# Patient Record
Sex: Female | Born: 1940 | Race: White | Hispanic: No | Smoking: Former smoker
Health system: Southern US, Community
[De-identification: ages and names within clinical notes are randomized; demographics above are authoritative.]

## PROBLEM LIST (undated history)

## (undated) DIAGNOSIS — M545 Low back pain, unspecified: Secondary | ICD-10-CM

## (undated) DIAGNOSIS — G4700 Insomnia, unspecified: Secondary | ICD-10-CM

## (undated) DIAGNOSIS — F0280 Dementia in other diseases classified elsewhere without behavioral disturbance: Secondary | ICD-10-CM

## (undated) DIAGNOSIS — R1110 Vomiting, unspecified: Secondary | ICD-10-CM

## (undated) HISTORY — PX: VAGINAL HYSTERECTOMY: SUR661

---
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
## 1999-10-28 ENCOUNTER — Ambulatory Visit (HOSPITAL_COMMUNITY): Admission: RE | Admit: 1999-10-28 | Discharge: 1999-10-28 | Payer: Self-pay

## 2001-08-08 ENCOUNTER — Inpatient Hospital Stay (HOSPITAL_COMMUNITY): Admission: EM | Admit: 2001-08-08 | Discharge: 2001-08-13 | Payer: Self-pay | Admitting: *Deleted

## 2001-08-08 ENCOUNTER — Encounter: Payer: Self-pay | Admitting: *Deleted

## 2001-08-09 ENCOUNTER — Encounter: Payer: Self-pay | Admitting: Family Medicine

## 2001-08-22 ENCOUNTER — Encounter: Admission: RE | Admit: 2001-08-22 | Discharge: 2001-08-22 | Payer: Self-pay | Admitting: Family Medicine

## 2001-11-01 ENCOUNTER — Ambulatory Visit (HOSPITAL_COMMUNITY): Admission: RE | Admit: 2001-11-01 | Discharge: 2001-11-01 | Payer: Self-pay | Admitting: *Deleted

## 2004-11-24 ENCOUNTER — Emergency Department (HOSPITAL_COMMUNITY): Admission: EM | Admit: 2004-11-24 | Discharge: 2004-11-24 | Payer: Self-pay | Admitting: Emergency Medicine

## 2006-05-31 ENCOUNTER — Emergency Department (HOSPITAL_COMMUNITY): Admission: EM | Admit: 2006-05-31 | Discharge: 2006-05-31 | Payer: Self-pay | Admitting: Emergency Medicine

## 2006-06-10 ENCOUNTER — Emergency Department (HOSPITAL_COMMUNITY): Admission: EM | Admit: 2006-06-10 | Discharge: 2006-06-10 | Payer: Self-pay | Admitting: Emergency Medicine

## 2006-11-16 ENCOUNTER — Encounter: Admission: RE | Admit: 2006-11-16 | Discharge: 2006-11-16 | Payer: Self-pay | Admitting: Neurology

## 2007-01-17 ENCOUNTER — Emergency Department (HOSPITAL_COMMUNITY): Admission: EM | Admit: 2007-01-17 | Discharge: 2007-01-17 | Payer: Self-pay | Admitting: *Deleted

## 2007-01-24 ENCOUNTER — Inpatient Hospital Stay (HOSPITAL_COMMUNITY): Admission: EM | Admit: 2007-01-24 | Discharge: 2007-01-26 | Payer: Self-pay | Admitting: Emergency Medicine

## 2007-10-01 IMAGING — CR DG CHEST 2V
2 series · 2 of 2 positions shown · non-contrast
Comparison: 06/10/06.

CLINICAL DATA: Wheezing.
 TWO VIEW CHEST:

[view not recorded (1 of 2)]
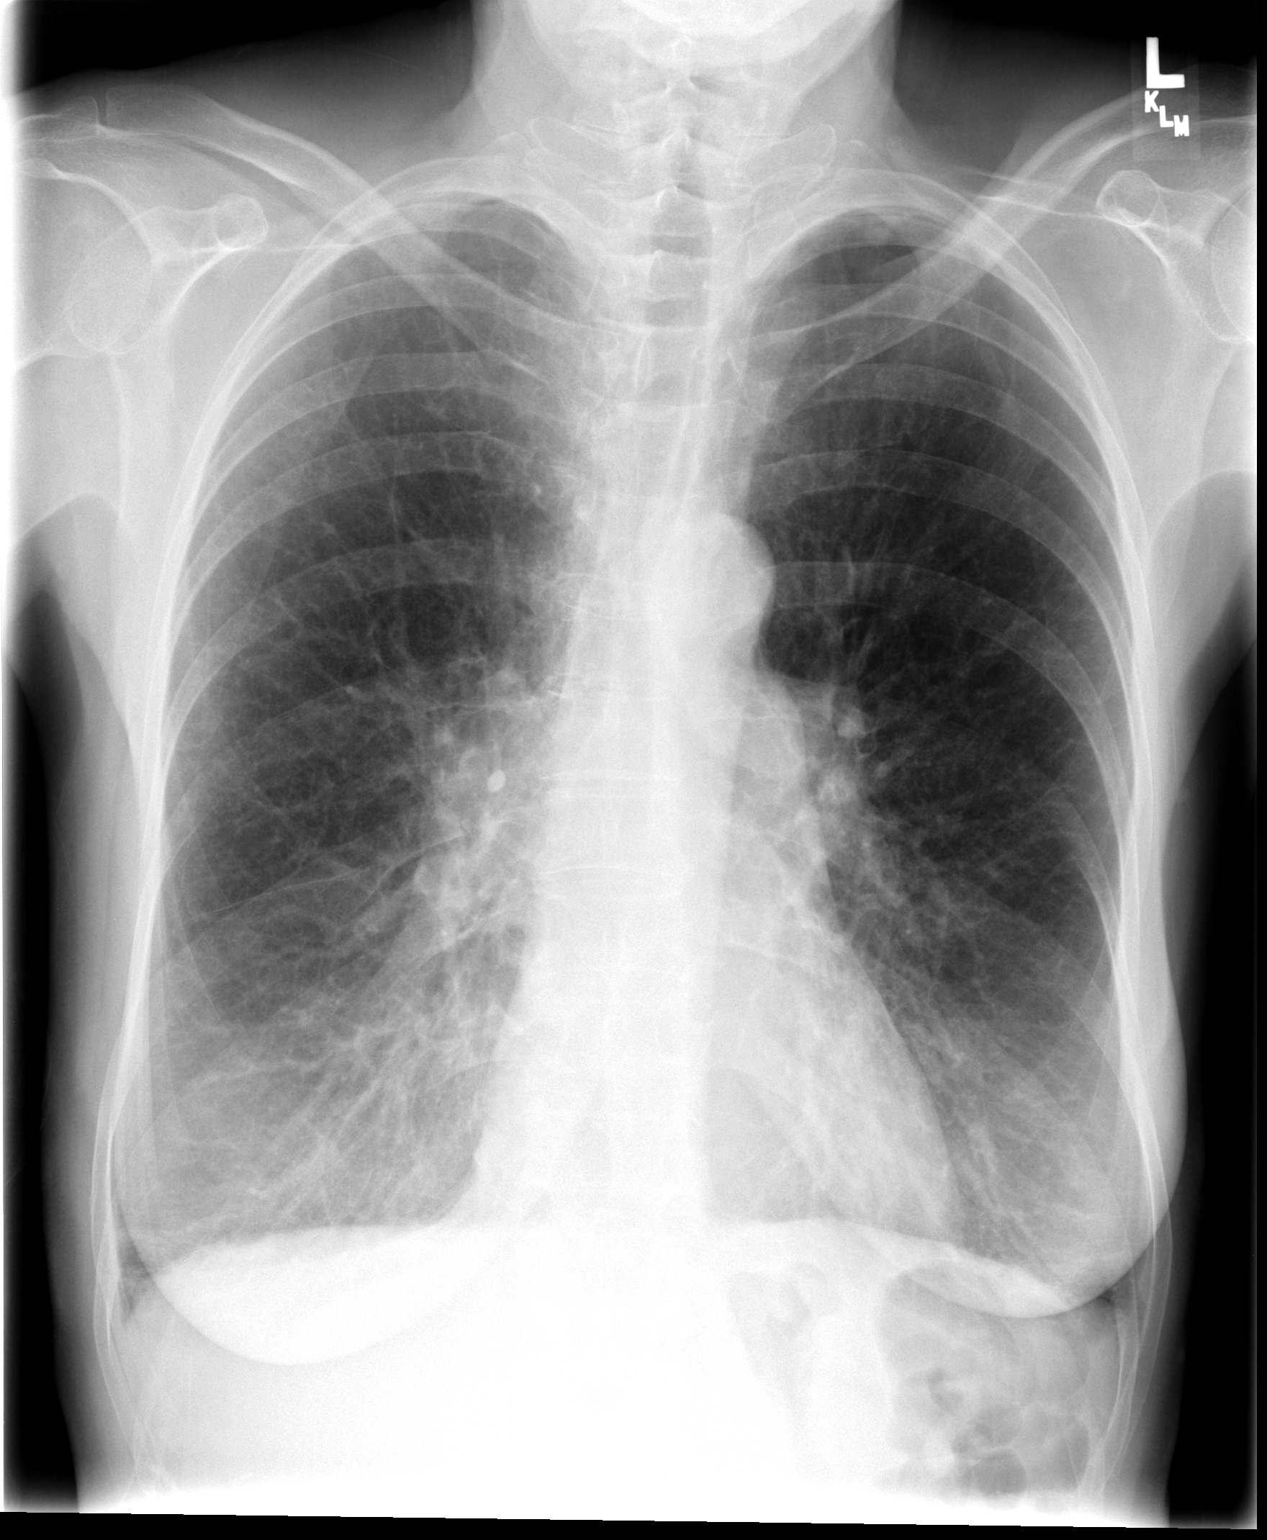

[view not recorded (2 of 2)]
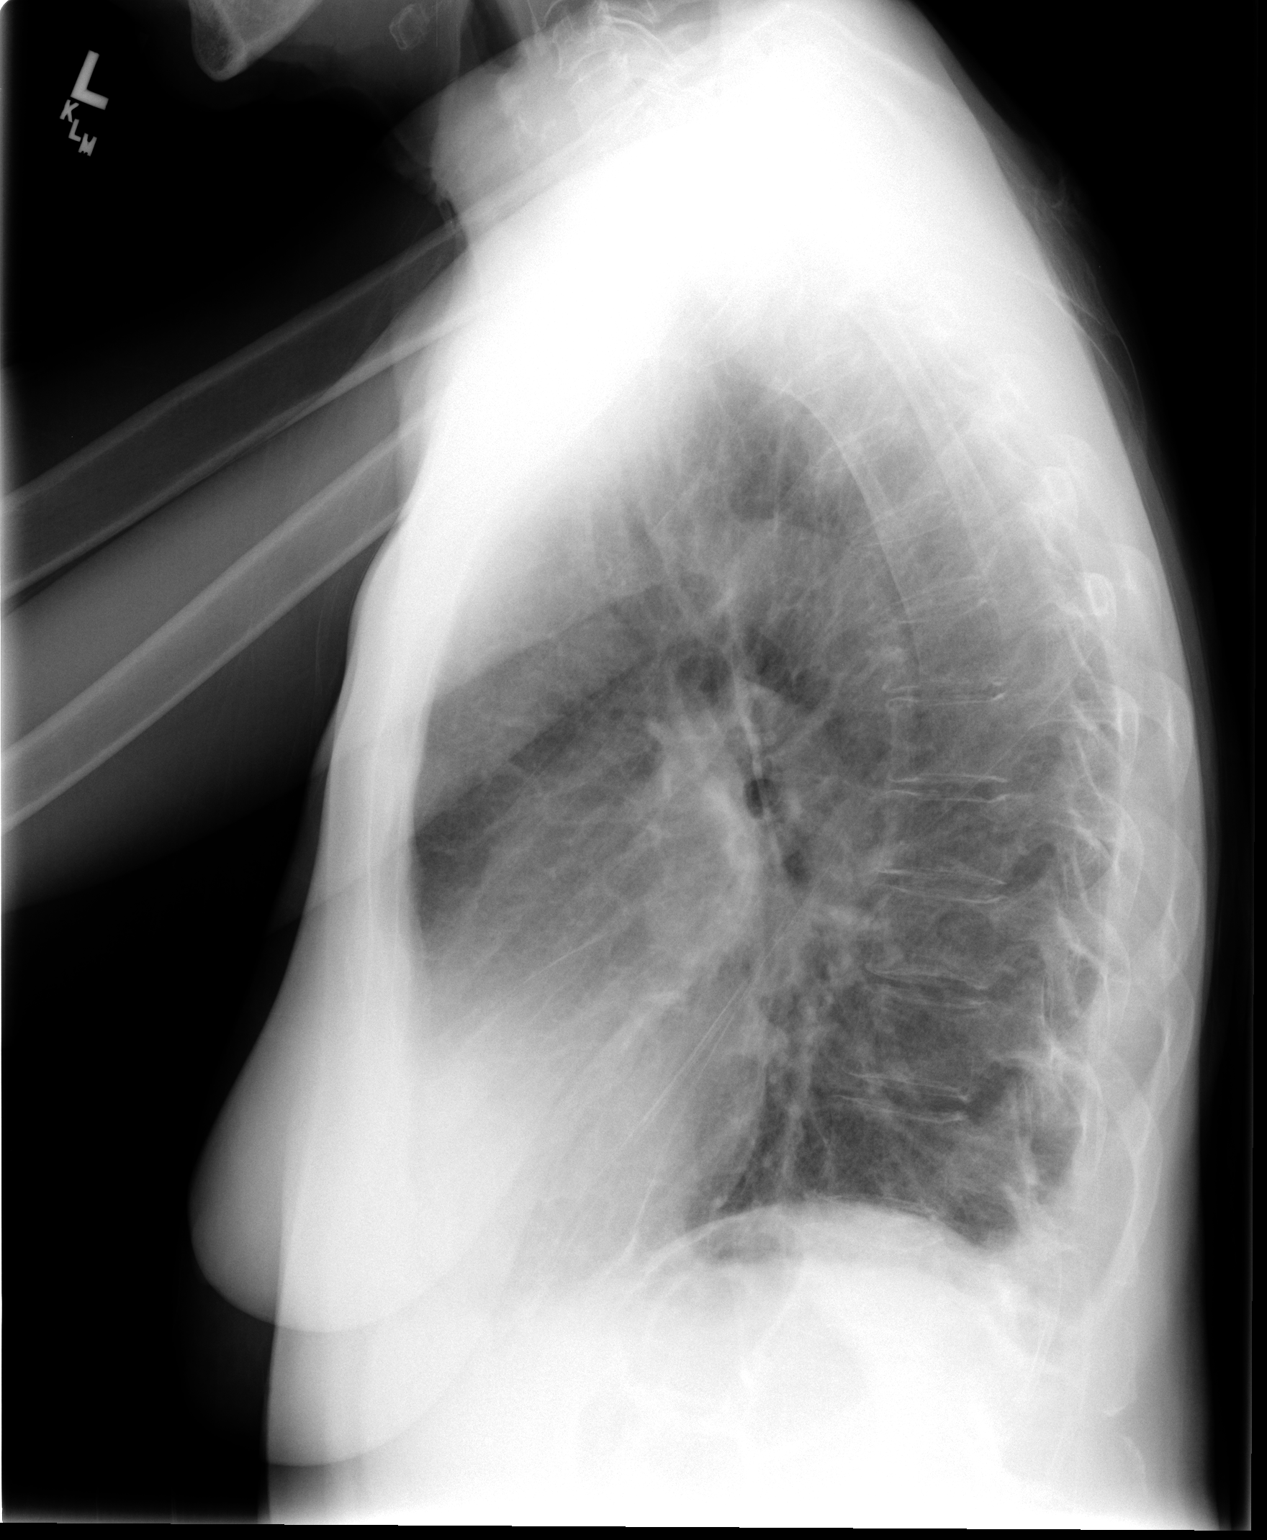

[2 of 2 positions shown; findings below may reference images not displayed]

FINDINGS: Normal heart size.  Lungs are hyperinflated with background COPD and chronic bronchial thickening.  Bibasilar atelectasis or scarring is noted. The hila are prominent bilaterally.  This can be seen with pulmonary arterial hypertension from COPD.  Underlying adenopathy is not excluded.
IMPRESSION: 1.  Chronic background COPD and bronchial thickening.
 2.  Bibasilar atelectasis.
 3.  No acute CHF or pneumonia.
 4.  Prominent hila bilaterally.  Suspect enlarged pulmonary arteries from secondary pulmonary arterial hypertension.  Underlying adenopathy not entirely excluded.

## 2009-04-05 ENCOUNTER — Emergency Department (HOSPITAL_COMMUNITY): Admission: EM | Admit: 2009-04-05 | Discharge: 2009-04-05 | Payer: Self-pay | Admitting: Emergency Medicine

## 2009-06-24 ENCOUNTER — Emergency Department (HOSPITAL_COMMUNITY): Admission: EM | Admit: 2009-06-24 | Discharge: 2009-06-25 | Payer: Self-pay | Admitting: Emergency Medicine

## 2010-02-03 ENCOUNTER — Emergency Department (HOSPITAL_COMMUNITY): Admission: EM | Admit: 2010-02-03 | Discharge: 2010-02-03 | Payer: Self-pay | Admitting: Emergency Medicine

## 2010-11-09 ENCOUNTER — Emergency Department (HOSPITAL_COMMUNITY)
Admission: EM | Admit: 2010-11-09 | Discharge: 2010-11-09 | Disposition: A | Discharge status: Skilled Nursing Facility | Payer: Medicare Other | Attending: Emergency Medicine | Admitting: Emergency Medicine

## 2010-11-09 ENCOUNTER — Emergency Department (HOSPITAL_COMMUNITY): Payer: Medicare Other

## 2010-11-09 DIAGNOSIS — R112 Nausea with vomiting, unspecified: Secondary | ICD-10-CM | POA: Insufficient documentation

## 2010-11-09 DIAGNOSIS — M810 Age-related osteoporosis without current pathological fracture: Secondary | ICD-10-CM | POA: Insufficient documentation

## 2010-11-09 DIAGNOSIS — K5900 Constipation, unspecified: Secondary | ICD-10-CM | POA: Insufficient documentation

## 2010-11-09 DIAGNOSIS — F1020 Alcohol dependence, uncomplicated: Secondary | ICD-10-CM | POA: Insufficient documentation

## 2010-11-09 DIAGNOSIS — F319 Bipolar disorder, unspecified: Secondary | ICD-10-CM | POA: Insufficient documentation

## 2010-11-09 LAB — CBC
HCT: 38.5 % (ref 36.0–46.0)
MCHC: 31.9 g/dL (ref 30.0–36.0)
MCV: 95.3 fL (ref 78.0–100.0)
Platelets: 343 10*3/uL (ref 150–400)
RDW: 13.2 % (ref 11.5–15.5)
WBC: 8 10*3/uL (ref 4.0–10.5)

## 2010-11-09 LAB — URINALYSIS, ROUTINE W REFLEX MICROSCOPIC
Hgb urine dipstick: NEGATIVE
Protein, ur: NEGATIVE mg/dL
Specific Gravity, Urine: 1.03 (ref 1.005–1.030)
Urine Glucose, Fasting: NEGATIVE mg/dL
Urobilinogen, UA: 1 mg/dL (ref 0.0–1.0)

## 2010-11-09 LAB — COMPREHENSIVE METABOLIC PANEL
AST: 19 U/L (ref 0–37)
Albumin: 3.5 g/dL (ref 3.5–5.2)
CO2: 22 mEq/L (ref 19–32)
Glucose, Bld: 100 mg/dL — ABNORMAL HIGH (ref 70–99)
Potassium: 3.7 mEq/L (ref 3.5–5.1)

## 2010-11-09 LAB — DIFFERENTIAL
Basophils Absolute: 0 10*3/uL (ref 0.0–0.1)
Basophils Relative: 0 % (ref 0–1)
Eosinophils Absolute: 0.3 10*3/uL (ref 0.0–0.7)
Lymphs Abs: 3.7 10*3/uL (ref 0.7–4.0)
Neutro Abs: 3.2 10*3/uL (ref 1.7–7.7)

## 2010-11-09 LAB — LIPASE, BLOOD: Lipase: 20 U/L (ref 11–59)

## 2010-11-26 ENCOUNTER — Inpatient Hospital Stay (HOSPITAL_COMMUNITY)
Admission: EM | Admit: 2010-11-26 | Discharge: 2010-12-01 | DRG: 193 | Disposition: A | Discharge status: Skilled Nursing Facility | Payer: Medicare Other | Attending: Internal Medicine | Admitting: Internal Medicine

## 2010-11-26 ENCOUNTER — Emergency Department (HOSPITAL_COMMUNITY): Payer: Medicare Other

## 2010-11-26 DIAGNOSIS — G20A1 Parkinson's disease without dyskinesia, without mention of fluctuations: Secondary | ICD-10-CM | POA: Diagnosis present

## 2010-11-26 DIAGNOSIS — J189 Pneumonia, unspecified organism: Principal | ICD-10-CM | POA: Diagnosis present

## 2010-11-26 DIAGNOSIS — F319 Bipolar disorder, unspecified: Secondary | ICD-10-CM | POA: Diagnosis present

## 2010-11-26 DIAGNOSIS — J441 Chronic obstructive pulmonary disease with (acute) exacerbation: Secondary | ICD-10-CM | POA: Diagnosis present

## 2010-11-26 DIAGNOSIS — K5900 Constipation, unspecified: Secondary | ICD-10-CM | POA: Diagnosis present

## 2010-11-26 DIAGNOSIS — E876 Hypokalemia: Secondary | ICD-10-CM | POA: Diagnosis present

## 2010-11-26 DIAGNOSIS — G20 Parkinson's disease: Secondary | ICD-10-CM | POA: Diagnosis present

## 2010-11-26 DIAGNOSIS — E860 Dehydration: Secondary | ICD-10-CM | POA: Diagnosis present

## 2010-11-26 DIAGNOSIS — F17200 Nicotine dependence, unspecified, uncomplicated: Secondary | ICD-10-CM | POA: Diagnosis present

## 2010-11-26 DIAGNOSIS — F341 Dysthymic disorder: Secondary | ICD-10-CM | POA: Diagnosis present

## 2010-11-26 DIAGNOSIS — E785 Hyperlipidemia, unspecified: Secondary | ICD-10-CM | POA: Diagnosis present

## 2010-11-26 DIAGNOSIS — J9600 Acute respiratory failure, unspecified whether with hypoxia or hypercapnia: Secondary | ICD-10-CM | POA: Diagnosis present

## 2010-11-26 LAB — CBC
HCT: 36.3 % (ref 36.0–46.0)
Hemoglobin: 12 g/dL (ref 12.0–15.0)
MCH: 31 pg (ref 26.0–34.0)
MCV: 93.8 fL (ref 78.0–100.0)
RBC: 3.87 MIL/uL (ref 3.87–5.11)
WBC: 17.1 10*3/uL — ABNORMAL HIGH (ref 4.0–10.5)

## 2010-11-26 LAB — POCT CARDIAC MARKERS: CKMB, poc: 1.2 ng/mL (ref 1.0–8.0)

## 2010-11-26 LAB — DIFFERENTIAL
Basophils Absolute: 0 10*3/uL (ref 0.0–0.1)
Eosinophils Absolute: 0 10*3/uL (ref 0.0–0.7)
Eosinophils Relative: 0 % (ref 0–5)
Lymphocytes Relative: 14 % (ref 12–46)
Monocytes Relative: 13 % — ABNORMAL HIGH (ref 3–12)
Neutrophils Relative %: 73 % (ref 43–77)

## 2010-11-26 LAB — BASIC METABOLIC PANEL
BUN: 22 mg/dL (ref 6–23)
CO2: 21 mEq/L (ref 19–32)
Calcium: 8.6 mg/dL (ref 8.4–10.5)
Creatinine, Ser: 1.04 mg/dL (ref 0.4–1.2)
GFR calc non Af Amer: 52 mL/min — ABNORMAL LOW (ref >60)
Potassium: 3.8 mEq/L (ref 3.5–5.1)
Sodium: 135 mEq/L (ref 135–145)

## 2010-11-26 LAB — URINALYSIS, ROUTINE W REFLEX MICROSCOPIC
Glucose, UA: NEGATIVE mg/dL
Hgb urine dipstick: NEGATIVE
Protein, ur: NEGATIVE mg/dL
Specific Gravity, Urine: 1.025 (ref 1.005–1.030)
pH: 5.5 (ref 5.0–8.0)

## 2010-11-26 LAB — BRAIN NATRIURETIC PEPTIDE: Pro B Natriuretic peptide (BNP): 89.1 pg/mL (ref 0.0–100.0)

## 2010-11-27 ENCOUNTER — Inpatient Hospital Stay (HOSPITAL_COMMUNITY): Payer: Medicare Other

## 2010-11-27 LAB — CBC
Hemoglobin: 11.6 g/dL — ABNORMAL LOW (ref 12.0–15.0)
MCHC: 32.1 g/dL (ref 30.0–36.0)
RBC: 3.83 MIL/uL — ABNORMAL LOW (ref 3.87–5.11)
WBC: 15.8 10*3/uL — ABNORMAL HIGH (ref 4.0–10.5)

## 2010-11-27 LAB — COMPREHENSIVE METABOLIC PANEL
ALT: 20 U/L (ref 0–35)
AST: 25 U/L (ref 0–37)
CO2: 19 mEq/L (ref 19–32)
Calcium: 8.8 mg/dL (ref 8.4–10.5)
Chloride: 112 mEq/L (ref 96–112)
GFR calc Af Amer: >60 mL/min (ref >60)
GFR calc non Af Amer: 57 mL/min — ABNORMAL LOW (ref >60)
Sodium: 139 mEq/L (ref 135–145)

## 2010-11-27 LAB — T4, FREE: Free T4: 1.15 ng/dL (ref 0.80–1.80)

## 2010-11-27 LAB — INFLUENZA PANEL BY PCR (TYPE A & B): Influenza B By PCR: NEGATIVE

## 2010-11-27 LAB — TSH: TSH: 1.354 u[IU]/mL (ref 0.350–4.500)

## 2010-11-27 NOTE — H&P (Signed)
[name], [name]  MEDICAL RECORD NO.:  [name]           PATIENT TYPE:  E  LOCATION:  WLED                         FACILITY:  [facility]  PHYSICIAN:  Vania Rea, M.D. DATE OF BIRTH:  [birth year]  DATE OF ADMISSION:  11/26/2010 DATE OF DISCHARGE:                             HISTORY & PHYSICAL   PRIMARY CARE PHYSICIAN:  Dr. Florentina Jenny  CHIEF COMPLAINT:  Persistent cough and shortness of breath for 1 week.  HISTORY OF PRESENT ILLNESS:  This is a resident of facility known as [facility] who was brought in by EMS with a history of cough and shortness of breath for 1 week.  The history is limited by the fact that we are unable to contact anybody at this facility, and the patient has a guardian of Department of Social Services who are now closed.  The history is, therefore, taken by reviewing the records that have accompanied the patient from the facility.  The papers that accompany her do not indicate that she uses oxygen.  This does not appear to be a skilled nursing facility.  Her guardian is listed in the records as Jenna Luo at [employer] [facility].  Telephone number [phone].  The patient's baseline status is unclear; however, she does have a history of bipolar disorder, dementia and Parkinson's.  PAST MEDICAL HISTORY: 1. Bipolar disorder. 2. Dementia. 3. Parkinson's type tremor. 4. Osteoarthritis. 5. Chronic constipation. 6. History of alcoholism.  MEDICATIONS: 1. Trazodone 100 mg at bedtime. 2. Seroquel 300 mg at bedtime. 3. Wellbutrin 150 mg twice daily. 4. Effexor extended release 150 mg each morning. 5. Ativan 0.5 mg twice daily when necessary. 6. Simvastatin 20 mg at bedtime. 7. Senna 1 tablet at bedtime. 8. MiraLAX 17 g each evening. 9. Niaspan 500 mg each evening. 10.Acetaminophen 500 mg three times daily. 11.Colace 100 mg twice daily. 12.Vitamin D3 1000 units daily. 13.Multivitamins  daily. 14.Megace suspension 400 mg daily.  ALLERGIES:  No known drug or food allergies.  SOCIAL HISTORY:  The patient apparently does smoke.  The chart indicates a history of alcoholism.  She is a resident of an extended care facility.  FAMILY HISTORY:  Unable to obtain.  REVIEW OF SYSTEMS:  Other than noted above, unable to obtain.  PHYSICAL EXAMINATION:  Elderly, Caucasian lady sitting up on the stretcher, tachypneic and somewhat confused, but not agitated. VITAL SIGNS:  Her temperature is 100.4, pulse is 65, respiration 22, blood pressure 116/65.  She is saturating at 94% on 3 liters. HEENT:  Her pupils are round and equal.  Mucous membranes pink, dry, anicteric.  No cervical lymphadenopathy or thyromegaly.  No carotid bruit. CHEST:  She has diffuse rhonchi generally.  No crackles heard. CARDIOVASCULAR SYSTEM:  Regular rhythm.  No murmurs heard. ABDOMEN:  Soft, nontender.  No masses. EXTREMITIES:  She has arthritic deformities of the hands and knees. Skin of her legs is dry and coarse.  Dorsalis pedis pulses are 1+ bilaterally. CENTRAL NERVOUS SYSTEM:  She has a coarse, generalized tremor, but no focal neurologic deficits.  LABORATORY DATA:  Her white count is 17.1,  hemoglobin 12.0, platelets 281.  Absolute neutrophil count 12.5.  Morphology shows toxic granulations.  Her beta natriuretic peptide is normal at 89.  Her sodium is 135, potassium 3.8, chloride 107, CO2 21, glucose 124, BUN 22, creatinine 1.0, calcium 8.6.  Her cardiac markers are completely normal with  undetectable troponins.  Myoglobin is 166.  CK-MB 1.2.  A two-view chest x-ray shows COPD with bibasilar scarring, no acute infiltrate and a small pleural effusion.  ASSESSMENT: 1. Acute exacerbation of chronic obstructive pulmonary disease. 2. Acute respiratory failure secondary to the above. 3. Dehydration. 4. Possible pneumonia given dehydration. 5. Dementia. 6. Bipolar disorder. 7. Major  depression. 8. History of constipation.  PLAN: 1. We will admit this lady to telemetry for treatment with antibiotics     and IV fluids as well as nebulizations.  We will hydrate her and     would recommend repeating a chest x-ray in the morning when     pneumonia may make itself more evident.  Alternatively     consideration could be given to a CT scan of the chest to further     elucidate her pulmonary problem. 2. We will discuss with Social Work to make further attempts to     contact her extended care facility early tomorrow or make further     attempts to contact her primary care physician or her guardian     tomorrow during office hours, since both of these attempts have     currently failed.     Vania Rea, M.D.     LC/MEDQ  D:  11/26/2010  T:  11/26/2010  Job:  [id number]  cc:   Florentina Jenny, M.D.  Virgil Benedict, M.D.  Kennon Rounds, MD  Electronically Signed by Vania Rea M.D. on 11/27/2010 02:25:49 AM

## 2010-11-28 LAB — CBC
HCT: 32.7 % — ABNORMAL LOW (ref 36.0–46.0)
Hemoglobin: 10.5 g/dL — ABNORMAL LOW (ref 12.0–15.0)
MCHC: 32.1 g/dL (ref 30.0–36.0)

## 2010-11-28 LAB — BASIC METABOLIC PANEL
CO2: 20 mEq/L (ref 19–32)
Calcium: 8.8 mg/dL (ref 8.4–10.5)
Chloride: 116 mEq/L — ABNORMAL HIGH (ref 96–112)
Glucose, Bld: 155 mg/dL — ABNORMAL HIGH (ref 70–99)
Potassium: 3.9 mEq/L (ref 3.5–5.1)
Sodium: 143 mEq/L (ref 135–145)

## 2010-11-29 LAB — BASIC METABOLIC PANEL
BUN: 22 mg/dL (ref 6–23)
CO2: 23 mEq/L (ref 19–32)
Calcium: 9 mg/dL (ref 8.4–10.5)
GFR calc non Af Amer: >60 mL/min (ref >60)
Glucose, Bld: 131 mg/dL — ABNORMAL HIGH (ref 70–99)

## 2010-11-29 LAB — CBC
HCT: 32.8 % — ABNORMAL LOW (ref 36.0–46.0)
MCH: 30.1 pg (ref 26.0–34.0)
MCHC: 31.7 g/dL (ref 30.0–36.0)
MCV: 95.1 fL (ref 78.0–100.0)
RDW: 13.9 % (ref 11.5–15.5)

## 2010-11-30 LAB — BASIC METABOLIC PANEL
Calcium: 9 mg/dL (ref 8.4–10.5)
Creatinine, Ser: 0.77 mg/dL (ref 0.4–1.2)
GFR calc Af Amer: >60 mL/min (ref >60)
GFR calc non Af Amer: >60 mL/min (ref >60)
Glucose, Bld: 118 mg/dL — ABNORMAL HIGH (ref 70–99)
Sodium: 140 mEq/L (ref 135–145)

## 2010-11-30 LAB — CBC
MCH: 29.6 pg (ref 26.0–34.0)
MCHC: 31.6 g/dL (ref 30.0–36.0)
RDW: 13.5 % (ref 11.5–15.5)

## 2010-11-30 LAB — FOLATE: Folate: 15.4 ng/mL

## 2010-11-30 LAB — POCT I-STAT, CHEM 8
BUN: 23 mg/dL (ref 6–23)
Calcium, Ion: 1.24 mmol/L (ref 1.12–1.32)
Chloride: 111 mEq/L (ref 96–112)
HCT: 40 % (ref 36.0–46.0)
Potassium: 3.8 mEq/L (ref 3.5–5.1)

## 2010-11-30 LAB — FERRITIN: Ferritin: 62 ng/mL (ref 10–291)

## 2010-11-30 LAB — IRON AND TIBC: Saturation Ratios: 16 % — ABNORMAL LOW (ref 20–55)

## 2010-12-01 LAB — BASIC METABOLIC PANEL
CO2: 21 mEq/L (ref 19–32)
Chloride: 113 mEq/L — ABNORMAL HIGH (ref 96–112)
Creatinine, Ser: 0.78 mg/dL (ref 0.4–1.2)
GFR calc Af Amer: >60 mL/min (ref >60)
Potassium: 3.9 mEq/L (ref 3.5–5.1)
Sodium: 140 mEq/L (ref 135–145)

## 2010-12-01 LAB — MAGNESIUM: Magnesium: 1.5 mg/dL (ref 1.5–2.5)

## 2010-12-02 NOTE — Discharge Summary (Signed)
[name], SIGNER               ACCOUNT NO.:  [id number]  MEDICAL RECORD NO.:  [id number]           PATIENT TYPE:  I  LOCATION:  1424                         FACILITY:  [facility]  PHYSICIAN:  Amber Mody, MD       DATE OF BIRTH:  1941/03/12  DATE OF ADMISSION:  11/26/2010 DATE OF DISCHARGE:                        DISCHARGE SUMMARY - REFERRING   PRIMARY CARE PHYSICIAN:  [name].  DISCHARGE DIAGNOSES: 1. Community-acquired pneumonia/chronic obstructive pulmonary disease     exacerbation. 2. Dehydration. 3. Bipolar disorder. 4. Constipation. 5. Nicotine dependence. 6. Hyperlipidemia.  DISCHARGE MEDICATIONS: 1. Prednisone 30 mg for 3 days, followed by 20 mg for another 3 days,     followed by 10 mg for 3 days. 2. Tylenol 325 mg p.o. doses 650 q.4h. p.r.n. 3. Bupropion 150 mg p.o. 2 tablets daily. 4. Trazodone 100 mg 1 tablet at bedtime. 5. Venlafaxine 150 mg 1 capsule daily. 6. Niacin 500 mg 1 tablet daily. 7. Atrovent inhalation nebulizer 0.5 mg q.6h. p.r.n. 8. Megestrol oral suspension 400 mg daily. 9. Seroquel 300 mg p.o. 1 tablet at bedtime. 10.Lorazepam 0.5 mg p.o. 1 tablet b.i.d. 11.Docusate 100 mg 1 cap b.i.d. 12.MiraLax 17 gm daily. 13.Senna p.o. 1 tablet at bedtime. 14.Simvastatin 20 mg p.o. 1 tablet daily. 15.Multivitamin p.o. daily. 16.Tramadol 50 mg p.o. q.h.s. for 1 week. 17.Moxifloxacin 400 mg daily for another 3 days. 18.Albuterol nebulizer inhalation 2.5 mg/3 mL q.6h. p.r.n. 19.Petroleum jelly as needed. 20.Vitamin D 3000 units p.o. 1 cap daily. 21.Ferrous sulfate 1 tablet twice a day.  CONSULTS CALLED:  None.  PROCEDURES:  None.  PERTINENT LABS:  Basic metabolic panel on admission showed a glucose of 124.  Point-of-care cardiac markers negative.  Beta natriuretic peptide was 89.  CBC showed a WBC count of 17.1, hemoglobin of 12, hematocrit of 36.3, platelets of 281,000.  Urinalysis shows negative nitrites and leukocytes.  MRSA PCR negative.  TSH  of 1.354, free T4 1.15.  Influenza panel by PCR negative.  Blood cultures negative so far.  Anemia panel done on November 30, 2010 showed ferritin of 62, vitamin B12 of 446, folate of 15.4.  On the day of admission, the patient's basic metabolic panel shows sodium of 140, potassium of 3.9, chloride of 113, bicarb of 21, glucose of 114, BUN of 15, creatinine of 0.78.  CBC showed WBC count of 12.8, hemoglobin of 10.7, hematocrit of 33.9, platelets of 329,000.  RADIOLOGY:  Chest x-ray on March 16 showed subtle new bilateral left upper lobe opacities, suspicious for areas of developing infiltrate, stable COPD and bibasilar scarring.  BRIEF HOSPITAL COURSE:  I have a 70 year old lady from a [facility], was brought for increased cough and shortness of breath for about 1 week.  She was found to have early pneumonia on her chest x-ray with component of COPD.  She was treated for both COPD exacerbation and pneumonia.  She was started on IV steroids, tapered quickly to p.o. prednisone.  She was started on Avelox, change to p.o. over the courseof hospitalization.  Her cough improved and her shortness of breath has resolved.  Dehydration.  She was given  IV fluids and her dehydration has resolved.  Bipolar disorder.  Continue with her home psychiatric medications.  Constipation.  Continue with Colace and MiraLax as needed.  DISCHARGE INSTRUCTIONS:  PT/OT evaluation was requested.  Recommended short-term rehab at SNF, so the patient will be discharged to a short- term SNF for a few weeks and later discharged to [facility]. She will follow up with the PMD associated with the short-term rehab and later on when she gets discharged, she is supposed to follow with [name] in about 4 weeks.  DIET:  Regular diet.  ACTIVITY:  As tolerated.          ______________________________ Amber Mody, MD     VA/MEDQ  D:  12/01/2010  T:  12/01/2010  Job:  [id number]  Electronically Signed by  Amber Mody MD on 12/02/2010 01:49:02 PM

## 2010-12-03 LAB — CULTURE, BLOOD (ROUTINE X 2)
Culture  Setup Time: 201203160115
Culture: NO GROWTH

## 2010-12-17 LAB — DIFFERENTIAL
Eosinophils Absolute: 0.2 10*3/uL (ref 0.0–0.7)
Eosinophils Relative: 2 % (ref 0–5)
Lymphocytes Relative: 35 % (ref 12–46)
Lymphs Abs: 4 10*3/uL (ref 0.7–4.0)
Monocytes Absolute: 0.9 10*3/uL (ref 0.1–1.0)
Monocytes Relative: 8 % (ref 3–12)

## 2010-12-17 LAB — CBC
HCT: 35.5 % — ABNORMAL LOW (ref 36.0–46.0)
Hemoglobin: 11.9 g/dL — ABNORMAL LOW (ref 12.0–15.0)
MCV: 94.7 fL (ref 78.0–100.0)
RBC: 3.75 MIL/uL — ABNORMAL LOW (ref 3.87–5.11)
WBC: 11.4 10*3/uL — ABNORMAL HIGH (ref 4.0–10.5)

## 2010-12-17 LAB — BASIC METABOLIC PANEL
CO2: 21 mEq/L (ref 19–32)
Chloride: 114 mEq/L — ABNORMAL HIGH (ref 96–112)
Creatinine, Ser: 0.86 mg/dL (ref 0.4–1.2)
GFR calc Af Amer: >60 mL/min (ref >60)
Sodium: 142 mEq/L (ref 135–145)

## 2010-12-17 LAB — POCT CARDIAC MARKERS: Troponin i, poc: <0.05 ng/mL (ref 0.00–0.09)

## 2010-12-20 LAB — BASIC METABOLIC PANEL
Calcium: 9.4 mg/dL (ref 8.4–10.5)
GFR calc Af Amer: >60 mL/min (ref >60)
GFR calc non Af Amer: 58 mL/min — ABNORMAL LOW (ref >60)
Glucose, Bld: 111 mg/dL — ABNORMAL HIGH (ref 70–99)
Sodium: 144 mEq/L (ref 135–145)

## 2010-12-20 LAB — URINE MICROSCOPIC-ADD ON

## 2010-12-20 LAB — URINALYSIS, ROUTINE W REFLEX MICROSCOPIC
Nitrite: NEGATIVE
Specific Gravity, Urine: 1.026 (ref 1.005–1.030)
pH: 5.5 (ref 5.0–8.0)

## 2010-12-20 LAB — DIFFERENTIAL
Basophils Absolute: 0.1 10*3/uL (ref 0.0–0.1)
Lymphocytes Relative: 30 % (ref 12–46)
Monocytes Absolute: 0.8 10*3/uL (ref 0.1–1.0)
Neutro Abs: 5.1 10*3/uL (ref 1.7–7.7)

## 2010-12-20 LAB — CBC
Hemoglobin: 13 g/dL (ref 12.0–15.0)
RBC: 4.13 MIL/uL (ref 3.87–5.11)
RDW: 13 % (ref 11.5–15.5)

## 2011-01-26 NOTE — Discharge Summary (Signed)
[name], [name]               ACCOUNT NO.:  [id number]   MEDICAL RECORD NO.:  [id number]          PATIENT TYPE:  INP   LOCATION:  1433                         FACILITY:  [facility]   PHYSICIAN:  Elliot Cousin, M.D.    DATE OF BIRTH:  13-Apr-1941   DATE OF ADMISSION:  01/24/2007  DATE OF DISCHARGE:  01/26/2007                               DISCHARGE SUMMARY   DISCHARGE DIAGNOSES:  1. Mild facial swelling.  Etiology unclear.  Completely resolved after      discontinuation of Namenda and decreasing the dose of Zoloft.  2. Worsening generalized tremor.  At the time of hospital discharge,      the patient demonstrated mild bilateral pen-rolling tremor of the      upper extremities.  She is treated with Mirapex for a presumed      diagnosis of Parkinson's disease.  3. Bipolar disorder.  The patient was maintained on Risperdal as      previously ordered.  4. Dementia.  The patient was maintained on Aricept, however, the      Namenda was discontinued.   DISPOSITION:  The patient is stable and in improved condition.  The plan  is to discharge her back to [facility] today on Jan 26, 2007.  She will follow up with her primary care physician who visits the rest  home monthly.   PROCEDURE:  1. CT scan of the head on Jan 24, 2007.  The results revealed a normal      examination.  2. Chest x-ray.  The results revealed stable appearance of the chest      with scarring at both lung bases.   HISTORY OF PRESENT ILLNESS:  The patient is a 70 year old woman with a  past medical history significant for bipolar disorder and depression.  She presented to the emergency department on Jan 24, 2007, from [facility] after being sent  for facial swelling and worsening shaking.  When the patient was evaluated in the emergency department, she was  alert, however, she was not oriented to place, time, or year.  She did  not know why she was sent to the emergency department.  A CT scan of the  head as ordered in the emergency department was completely negative.  The patient, however, was admitted for further evaluation and  management.   For additional details, please see the dictated history and physical.   HOSPITAL COURSE:  Problem 1.  MILD GENERALIZED FACIAL SWELLING.  Per the  review of the patient's medications, it was noted that she was recently  started on Mirapex and the dose of Zoloft was tripled over the past 2  weeks.  In addition, she was recently started on Namenda for dementia.  Also in review of the patient's medications, it was noted that she was  not on an ACE inhibitor.  The patient was monitored neurologically.  There were no obvious changes per the nursing staff.  There was also no  evidence of airway compromise and/or difficulty swallowing.  The Namenda  was discontinued.  The dose of the Zoloft was decreased  to 25 mg daily.  Over the course of the hospitalization, the mild facial puffiness  completely resolved.   Problem 2.  REPORTED WORSENING GENERALIZED TREMOR.  The patient was  recently started on Mirapex by Melvyn Novas, M.D., neurologist.  Dr.  Vickey Huger was not consulted during the hospitalization, however, it  appears that the Mirapex was started for Parkinson's disease.  On  examination initially, the patient had a mild generalized tremor.  As  indicated above, the Namenda was discontinued and the dose of Zoloft was  decreased to 25 mg daily.  The Mirapex was continued at 0.25 mg daily.  Over the course of the hospitalization, the upper body tremor completely  resolved but the patient continued to have a mild bilateral upper  extremity pen-rolling tremor.  Physical and occupational therapists  evaluated the patient and felt that there were no home health needs.  She ambulated in the hallway without any difficulty.   Of note, a number of investigational studies were ordered including a  RPR, vitamin B12, and TSH.  The RPR was nonreactive, the  vitamin B12 was  within normal limits at 293, and the TSH was within normal limits at  1.074.  The RBC folate was 698.  The patient was also started on a  multivitamin during the hospitalization.   DISCHARGE MEDICATIONS:  1. Multivitamin with iron once daily.  2. Senokot S one tablet q.h.s.  3. Risperdal 1 mg q.h.s. and 0.25 mg b.i.d.  4. Sertraline (Zoloft) 25 mg nightly.  5. Aricept 10 mg nightly.  6. Mirapex 0.25 mg with breakfast.  7. Discontinue Namenda.  8. Thiamine 100 mg daily.      Elliot Cousin, M.D.  Electronically Signed     DF/MEDQ  D:  01/26/2007  T:  01/26/2007  Job:  [id number]

## 2011-01-26 NOTE — H&P (Signed)
[name], BALZARINI NO.:  [id number]   MEDICAL RECORD NO.:  [id number]          PATIENT TYPE:  EMS   LOCATION:  ED                           FACILITY:  [name]   PHYSICIAN:  Elliot Cousin, M.D.    DATE OF BIRTH:  01/17/1941   DATE OF ADMISSION:  01/24/2007  DATE OF DISCHARGE:                              HISTORY & PHYSICAL   CHIEF COMPLAINT:  Shaking and facial swelling per the report from  [name].  The patient does not know why she is here in the  emergency department.   HISTORY OF PRESENT ILLNESS:  The patient is a 70 year old woman with a  past medical history significant for bipolar disorder, probable  dementia, and psychosis, who presents to the emergency department from  [name].  The patient does not know why she is here.  She has  no complaints of headache, subjective fevers or chills, difficulty  swallowing, chest pain, shortness of breath, abdominal pain, nausea,  vomiting, diarrhea, or dysuria.  According to the employee at [name]), the patient was sent to the emergency department  because she has had several days of facial swelling and an increase in  shaking.  The patient currently denies facial swelling.  She denies any  history of a swollen tongue or difficulty swallowing or breathing.   In review of the radiographic studies performed over the past few  months, the patient was evaluated with an MRI of the brain at [facility].  The study was ordered by Dr. Vickey Huger, neurologist.  From what  I can gather, the patient was probably referred to neurologist, Dr.  Vickey Huger, for shaking.  In review of her medications, she was started  Mirapex, a medication for Parkinson's disease.  In addition, per [name]  of [name], the patient's Zoloft was recently increased from  50 mg daily to 150 mg daily last week.  Namenda was also started at 5 mg  daily.   During the evaluation in the emergency  department, the patient is noted  to be hemodynamically stable.  A CT scan of the head reveals a normal  exam.  Her chest x-ray reveals stable appearance of the chest with  scarring at both lung bases.  Her lab data are unremarkable; however,  given the patient's overall debilitation, she will be admitted for  further evaluation and management.   PAST MEDICAL HISTORY:  1. Bipolar disorder/depression.  2. History of suicide attempt with Seroquel and desipramine in      November, 2002 causing ventilator-dependent respiratory failure.  3. History of multiple suicide attempts in the past.  4. Probable dementia (the patient is treated by her  physician with      Aricept and recently Namenda).  5. Probable psychosis, as the patient is treated chronically with      Risperdal.  6. 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 Vickey Huger in March, 2008.  7. Tobacco abuse.  8. History of prolonged Q-T interval during the November,  2002      hospitalization.  9. History of constipation.  10.DNR status.   MEDICATIONS:  1. Risperdal 1 mg nightly and 0.25 mg b.i.d.  2. Sertraline 150 mg nightly.  3. Aricept 10 mg nightly.  4. Mirapex 0.25 mg at breakfast.  5. Namenda 5 mg daily.   ALLERGIES:  No known drug allergies.   SOCIAL HISTORY:  The patient is a resident of [address] [place]  She is divorced.  She has no children.  She  smokes one pack of cigarettes per day and has been smoking for many  years.  She is a recovering alcoholic and has not drunk alcohol in many  years.  She denies illicit drug use.   FAMILY HISTORY:  Her father died of lung cancer.  Her mother died of a  stroke.   REVIEW OF SYSTEMS:  As above in the history of present illness.   PHYSICAL EXAMINATION:  VITAL SIGNS:  Temperature 98.5, blood pressure  111/62, pulse 58, respiratory rate 18, oxygen saturation 98% on room  air.   GENERAL:  The patient appears to be a debilitated 70 year old Caucasian  woman who is currently sitting in bed in no acute distress.  HEENT:  Head is normocephalic and atraumatic.  Pupils are equal, round  and reactive to light.  Extraocular movements are intact.  Conjunctivae  are clear.  Sclerae are white.  Nasal mucosa is dry.  No sinus  tenderness.  Oropharynx revealed a full set of dentures.  Mucous  membranes are dry.  No posterior exudates or erythema.  Generally, the  patient's face appears to be a little puffy but no obvious gross edema,  although it is difficult to assess because I am unaware of her baseline.  There is no evidence of oropharyngeal edema.  NECK:  Supple.  No adenopathy.  No thyromegaly.  No bruit.  No JVD.  LUNGS:  Decreased breath sounds at the bases, otherwise clear.  HEART:  S1 and S2 with a soft systolic murmur.  ABDOMEN:  Positive bowel sounds.  Soft, nontender, nondistended.  No  hepatosplenomegaly.  No masses palpated.  GU/RECTAL:  Deferred.  EXTREMITIES:  Pedal pulses are barely palpable bilaterally.  No  pretibial edema.  No pedal edema.  The patient moves all of her  extremities without hesitation.  NEUROLOGIC:  Patient is alert and oriented to herself but not to the  place, the time, the year, the month, or the president.  Cranial nerves  II-XII appear to be grossly intact.  She follows commands.  Her memory  is deficient with regards to her past medical history; however, she  readily is able to inform the dictating physician of the cause of her  parents' deaths.  She does have a mild bilateral hand tremor, not  necessarily pin-rolling.  She also has a mild generalized tremor without  any evidence of increase in tonicity or spasticity.  No jerking motions  of her muscles are present.  Strength:  The patient is able to lift each  leg against gravity, approximately 45 degrees.  Her hand grip bilaterally is 5/5.  Sensation is intact to soft and sharp  touch.  Gait  not assessed.   ADMISSION LABORATORIES:  EKG reveals sinus bradycardia with a heart rate  of 55 beats per minute.   Chest x-ray and CT scan of the head results are above.   CK 1.6, troponin I less than 0.05.  Urinalysis essentially negative.  PTT  30, PT 12.8, alcohol level less than 5.  Sodium 142, potassium 4.1,  chloride 107, CO2 27, glucose 102, BUN 15, creatinine 0.62, total  bilirubin 0.6, alkaline phosphatase 76, SGOT 19, SGPT 15, total protein  5.7, albumin 3.2, protein 5.7, albumin 3.2, calcium 9.5.  WBC 8.3,  hemoglobin 13.5, platelets 374.   ASSESSMENT:  1. Reported facial swelling:  The patient does have some puffiness of      the face; however, it does not appear to be dramatic or severe.      There is no evidence of oropharyngeal swelling or edema.  The      etiology of the reported facial swelling is unclear; however, it      could be medication-induced, given that the patient was recently      started on Namenda and the dose of Zoloft was tripled.  Per the      review of the patient's medications, she is not on an ACE      inhibitor.  2. Diffuse tremor.  Interestingly enough, neurologist, Dr. Vickey Huger,      ordered an MRI of the brain through [facility].  The      results are not available currently.  According to the patient's      medication list, Mirapex was started on December 09, 2006; a      medication generally prescribed for Parkinson's disease.  3. Bipolar disorder with a probable history of psychosis and      underlying dementia.  The patient is chronically treated with      Risperdal, Sertraline, Aricept, and Namenda (Namenda recently      started on January 10, 2007).  The patient is currently alert;      however, she is not oriented to time, place, year, or month.  Her      cranial nerves appear to be grossly intact.  4. Tobacco abuse.   PLAN:  1. The patient will be admitted for further evaluation and management.  2. Will  discontinue the Namenda and decrease the dose of Zoloft to 25      mg daily.  3. Neuro checks q.4h. x24 hours.  4. Will try to obtain the MRI of the brain results from [facility].  5. Will add a nicotine patch.  Official tobacco cessation counseling      has been ordered.  6. Will check vitamin studies, including vitamin B12 and folate.  Will      also assess the patient's RPR and TSH.  7. Will check an urine drug screen.  8. Will consult occupational and physical therapy.  9. The patient is a DNR, per records sent from the rest home.      Elliot Cousin, M.D.  Electronically Signed     DF/MEDQ  D:  01/24/2007  T:  01/24/2007  Job:  [id number]

## 2011-01-29 NOTE — Discharge Summary (Signed)
[place]  Patient:    [name], [name] Visit Number: [id number] MRN: [id number]          Service Type: MED Location: [phone] Attending Physician:  Doneta Public Dictated by:   Kinnie Scales. Reed Breech, M.D. Admit Date:  08/08/2001 Disc. Date: 08/13/01                             Discharge Summary  DISCHARGE DIAGNOSES: 1. Status post suicide attempt. 2. History of multiple suicide attempts. 3. History of depression. 4. Tobacco abuse. 5. 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 6. Status post hypothermia and QT prolongation.  CONSULTATIONS:  Adelene Amas. Williford, M.D., psychiatry; Dr. Marcos Eke, critical care medicine.  ADMISSION HISTORY AND PHYSICAL:  This 70 year old female presented to [facility] Emergency Room after being found unresponsive at her apartment on November 26.  She was seen at mental health several days prior due to her depression and was given desipramine and Seroquel.  The patient was seen by critical care and was found to have a temperature of 87 degrees Fahrenheit. The patient was intubated to protect her airway.  DISCHARGE MEDICATIONS: 1. Aspirin 325 mg one p.o. q day. 2. Nicotine patch taper. 3. Pepcid 20 mg p.o. q.h.s.  DISCHARGE INFORMATION:  The patient will be transferred to [facility] for inpatient psychiatric treatment.  HOSPITAL COURSE: 1. Ventilator-dependent respiratory failure.  The patient was extubated on the    27th.  Was in stable condition status post respirator. 2. Cardiac.  The patient did have EKG changes, notable QT prolongation and T    wave inversions.  Cardiac enzymes were borderline elevated.  Troponin-I    0.08.  The patient was asymptomatic with no chest pain, palpitations,    ______ pain.  Serial enzymes were checked and subsequently found to be    negative.  The patients EKG changes did resolve by discharge. 3. Psychiatric.  The patient took a drug overdose noted  to be tricyclics,    benzodiazepines, and antipsychotics.  She was still depressed and suicidal    after extubation threatening that if she went to [facility], she would    throw herself out the window of the hospital in another attempt to commit    suicide.  On December 1, Dr. Jeanie Sewer evaluated patient and recommended    intensive inpatient hospitalization for patient.  Behavioral Health was    unable to take patient and thus patient was referred to [facility] and is    being transferred on August 13, 2001. 4. Hypothermia.  Resolved with aggressive warming and treatment and    ventilator-dependent ventilation while hospitalized.  DISCHARGE HISTORY AND PHYSICAL:  The patient is doing well, relatively, today from a physical standpoint.  She is medically clear to be released into psychiatric care.  The patient will be transferred to [facility] today, August 13, 2001, in stable condition.  Upon her return to the area, she is encouraged to find a primary physician. Dictated by:   Kinnie Scales. Reed Breech, M.D. Attending Physician:  Doneta Public DD:  08/13/01 TD:  08/13/01 Job: [id number]

## 2011-03-11 ENCOUNTER — Emergency Department (HOSPITAL_COMMUNITY): Payer: Medicare Other

## 2011-03-11 ENCOUNTER — Emergency Department (HOSPITAL_COMMUNITY)
Admission: EM | Admit: 2011-03-11 | Discharge: 2011-03-12 | Disposition: A | Discharge status: Home / Self Care | Payer: Medicare Other | Attending: Emergency Medicine | Admitting: Emergency Medicine

## 2011-03-11 DIAGNOSIS — G20 Parkinson's disease: Secondary | ICD-10-CM | POA: Insufficient documentation

## 2011-03-11 DIAGNOSIS — W1811XA Fall from or off toilet without subsequent striking against object, initial encounter: Secondary | ICD-10-CM | POA: Insufficient documentation

## 2011-03-11 DIAGNOSIS — F0390 Unspecified dementia without behavioral disturbance: Secondary | ICD-10-CM | POA: Insufficient documentation

## 2011-03-11 DIAGNOSIS — J4489 Other specified chronic obstructive pulmonary disease: Secondary | ICD-10-CM | POA: Insufficient documentation

## 2011-03-11 DIAGNOSIS — J449 Chronic obstructive pulmonary disease, unspecified: Secondary | ICD-10-CM | POA: Insufficient documentation

## 2011-03-11 DIAGNOSIS — E785 Hyperlipidemia, unspecified: Secondary | ICD-10-CM | POA: Insufficient documentation

## 2011-03-11 DIAGNOSIS — R5381 Other malaise: Secondary | ICD-10-CM | POA: Insufficient documentation

## 2011-03-11 DIAGNOSIS — G20A1 Parkinson's disease without dyskinesia, without mention of fluctuations: Secondary | ICD-10-CM | POA: Insufficient documentation

## 2011-03-11 DIAGNOSIS — R55 Syncope and collapse: Secondary | ICD-10-CM | POA: Insufficient documentation

## 2011-03-11 LAB — POCT I-STAT, CHEM 8
Calcium, Ion: 1.14 mmol/L (ref 1.12–1.32)
Creatinine, Ser: 1.2 mg/dL — ABNORMAL HIGH (ref 0.50–1.10)
Glucose, Bld: 81 mg/dL (ref 70–99)
Hemoglobin: 13.6 g/dL (ref 12.0–15.0)
Sodium: 139 mEq/L (ref 135–145)
TCO2: 17 mmol/L (ref 0–100)

## 2011-03-11 LAB — CK TOTAL AND CKMB (NOT AT ARMC)
CK, MB: 2 ng/mL (ref 0.3–4.0)
Total CK: 72 U/L (ref 7–177)

## 2011-03-11 LAB — DIFFERENTIAL
Basophils Absolute: 0.1 10*3/uL (ref 0.0–0.1)
Basophils Relative: 0 % (ref 0–1)
Eosinophils Absolute: 0.2 10*3/uL (ref 0.0–0.7)
Eosinophils Relative: 2 % (ref 0–5)
Monocytes Absolute: 1.3 10*3/uL — ABNORMAL HIGH (ref 0.1–1.0)
Monocytes Relative: 10 % (ref 3–12)

## 2011-03-11 LAB — CBC
MCH: 32.3 pg (ref 26.0–34.0)
MCHC: 34.1 g/dL (ref 30.0–36.0)
RDW: 13.4 % (ref 11.5–15.5)

## 2011-03-12 LAB — URINALYSIS, ROUTINE W REFLEX MICROSCOPIC
Glucose, UA: NEGATIVE mg/dL
Ketones, ur: 15 mg/dL — AB
Ketones, ur: NEGATIVE mg/dL
Leukocytes, UA: NEGATIVE
Nitrite: NEGATIVE
Protein, ur: NEGATIVE mg/dL
Urobilinogen, UA: 0.2 mg/dL (ref 0.0–1.0)
pH: 5.5 (ref 5.0–8.0)
pH: 6 (ref 5.0–8.0)

## 2011-03-12 LAB — URINE MICROSCOPIC-ADD ON

## 2011-03-13 LAB — URINE CULTURE: Culture  Setup Time: 201206290944

## 2011-06-14 ENCOUNTER — Emergency Department (HOSPITAL_COMMUNITY)
Admission: EM | Admit: 2011-06-14 | Discharge: 2011-06-14 | Disposition: A | Discharge status: Home / Self Care | Payer: Medicare Other | Attending: Emergency Medicine | Admitting: Emergency Medicine

## 2011-06-14 DIAGNOSIS — N390 Urinary tract infection, site not specified: Secondary | ICD-10-CM | POA: Insufficient documentation

## 2011-06-14 DIAGNOSIS — F0280 Dementia in other diseases classified elsewhere without behavioral disturbance: Secondary | ICD-10-CM | POA: Insufficient documentation

## 2011-06-14 DIAGNOSIS — L089 Local infection of the skin and subcutaneous tissue, unspecified: Secondary | ICD-10-CM | POA: Insufficient documentation

## 2011-06-14 DIAGNOSIS — G3183 Dementia with Lewy bodies: Secondary | ICD-10-CM | POA: Insufficient documentation

## 2011-06-14 LAB — CBC
HCT: 40.9 % (ref 36.0–46.0)
Hemoglobin: 13.7 g/dL (ref 12.0–15.0)
MCH: 31.7 pg (ref 26.0–34.0)
MCV: 94.7 fL (ref 78.0–100.0)
Platelets: 290 10*3/uL (ref 150–400)
RBC: 4.32 MIL/uL (ref 3.87–5.11)
WBC: 8.3 10*3/uL (ref 4.0–10.5)

## 2011-06-14 LAB — DIFFERENTIAL
Eosinophils Absolute: 0.4 10*3/uL (ref 0.0–0.7)
Lymphocytes Relative: 40 % (ref 12–46)
Lymphs Abs: 3.3 10*3/uL (ref 0.7–4.0)
Monocytes Relative: 9 % (ref 3–12)
Neutro Abs: 3.8 10*3/uL (ref 1.7–7.7)
Neutrophils Relative %: 45 % (ref 43–77)

## 2011-06-14 LAB — BASIC METABOLIC PANEL
BUN: 20 mg/dL (ref 6–23)
CO2: 23 mEq/L (ref 19–32)
Calcium: 10 mg/dL (ref 8.4–10.5)
Chloride: 106 mEq/L (ref 96–112)
Creatinine, Ser: 0.72 mg/dL (ref 0.50–1.10)
Glucose, Bld: 103 mg/dL — ABNORMAL HIGH (ref 70–99)

## 2011-06-14 LAB — URINALYSIS, ROUTINE W REFLEX MICROSCOPIC
Glucose, UA: NEGATIVE mg/dL
Hgb urine dipstick: NEGATIVE
Protein, ur: NEGATIVE mg/dL
pH: 5 (ref 5.0–8.0)

## 2011-06-14 LAB — POTASSIUM: Potassium: 3.4 mEq/L — ABNORMAL LOW (ref 3.5–5.1)

## 2011-06-14 LAB — URINE MICROSCOPIC-ADD ON

## 2011-06-16 LAB — URINE CULTURE

## 2011-10-11 IMAGING — CR DG CHEST 2V
2 series · 2 of 2 positions shown · non-contrast
Comparison: 06/25/2009

CLINICAL DATA: Cough

CHEST - 2 VIEW

[w chest lat]
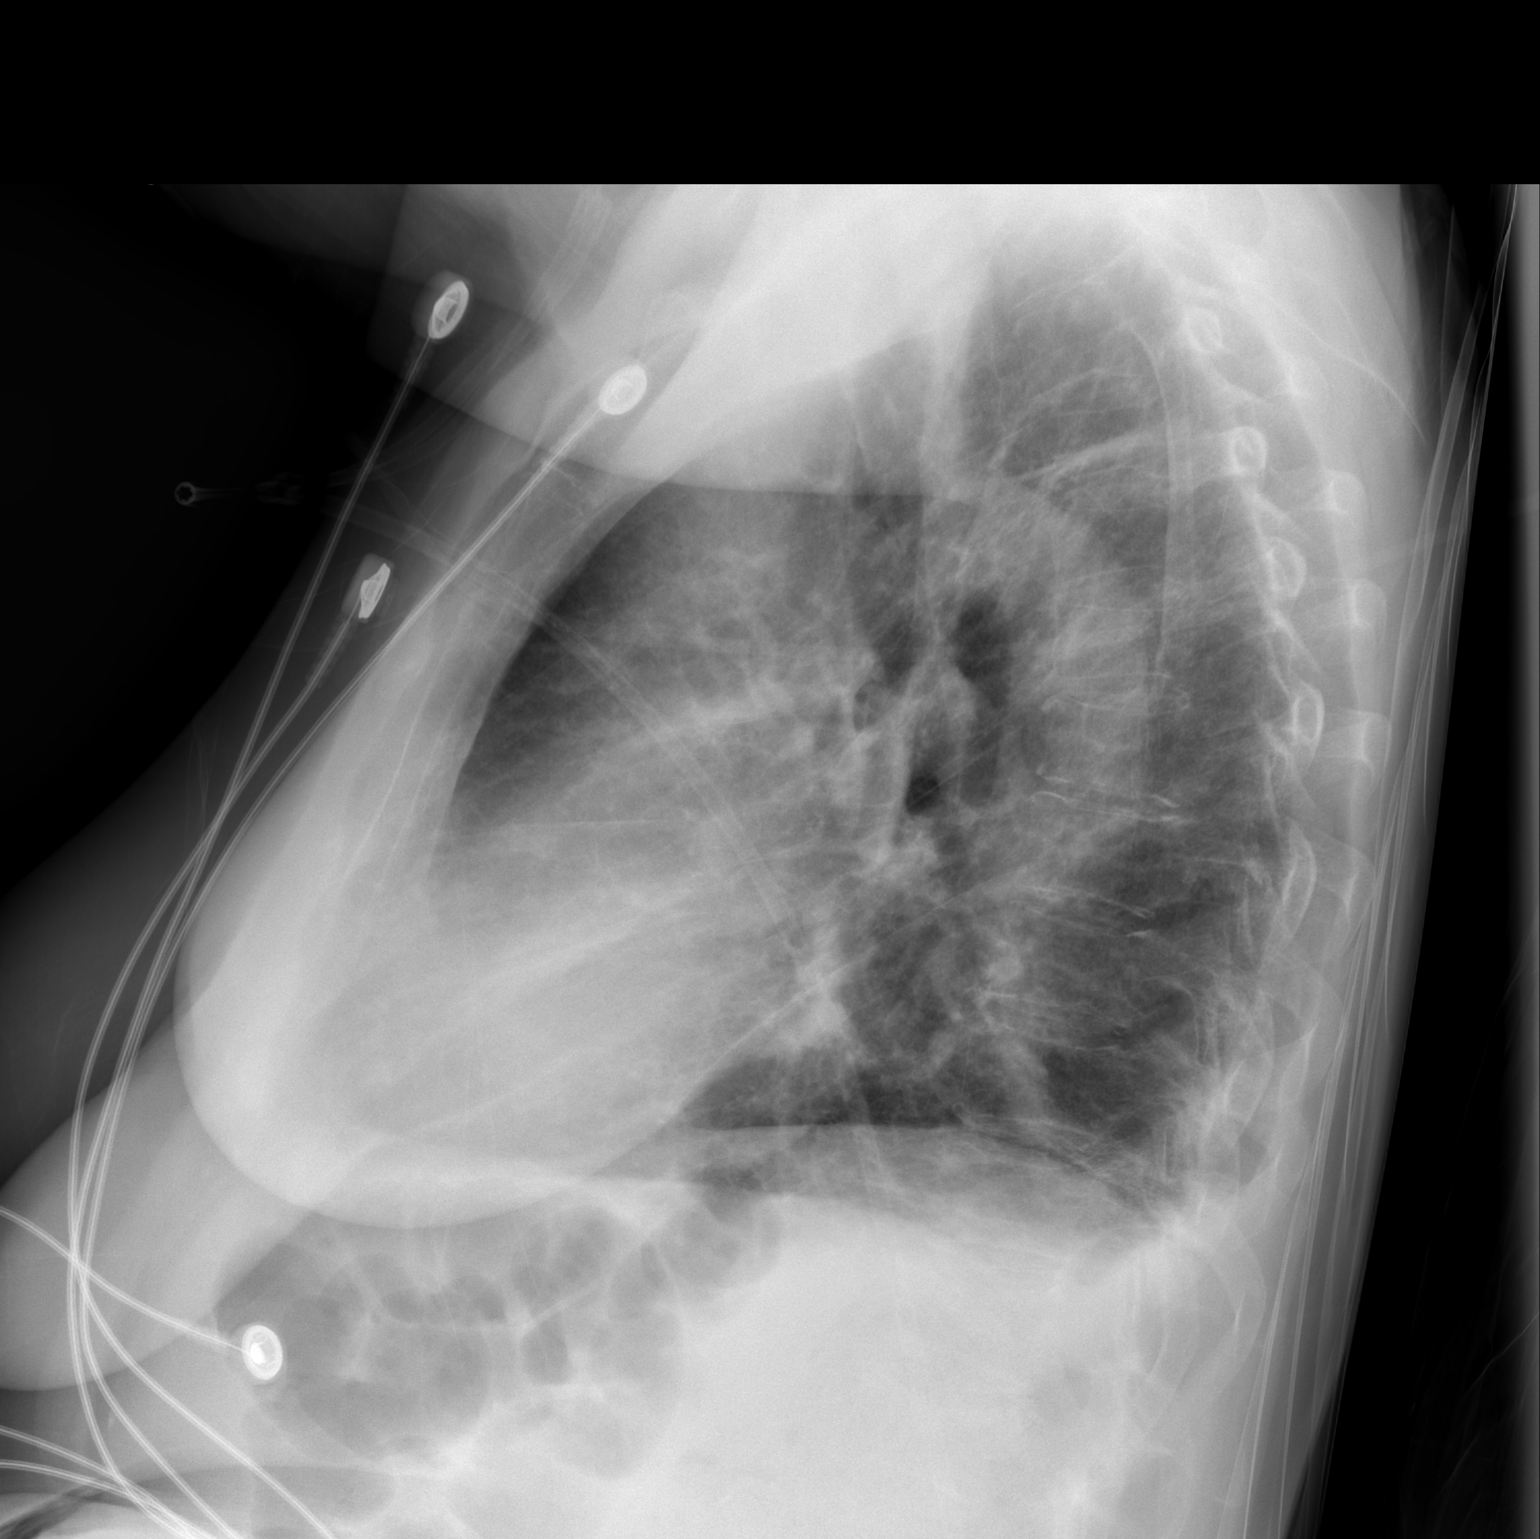

[view not recorded]
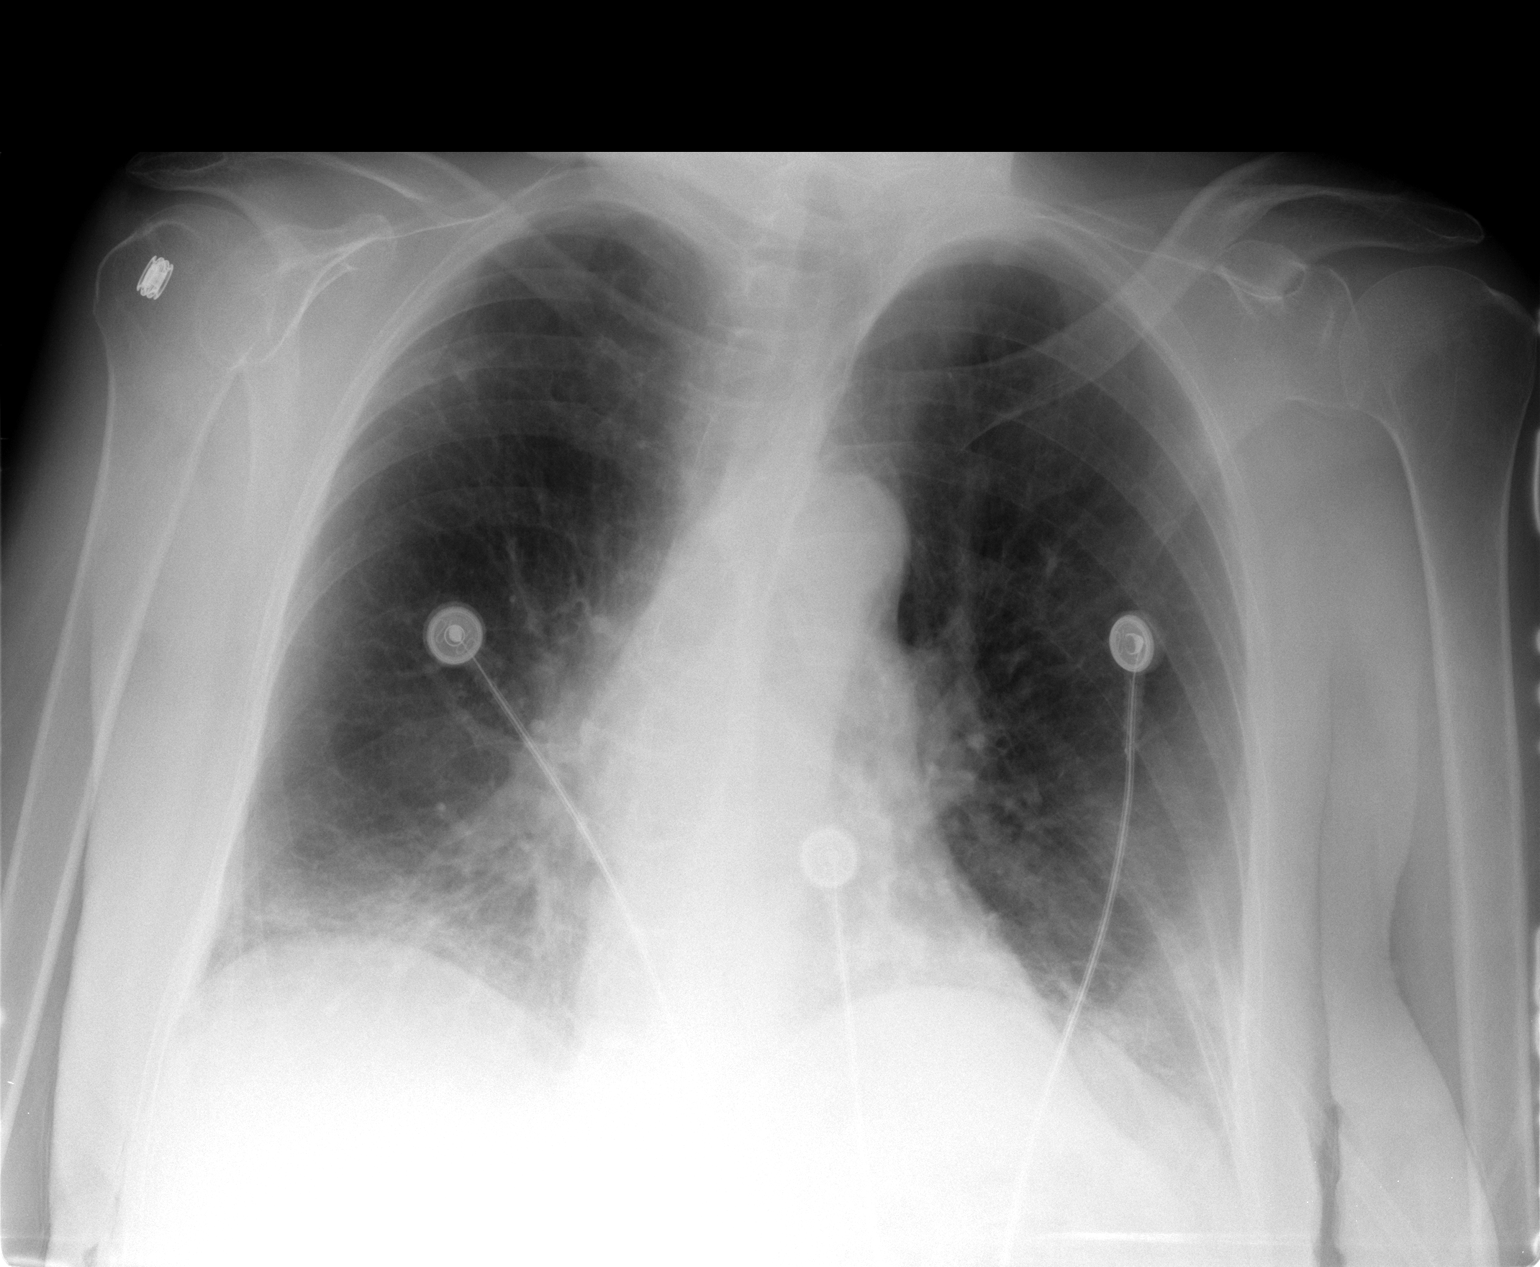

[2 of 2 positions shown; findings below may reference images not displayed]

FINDINGS: Chronic bibasilar scarring.  Small   pleural effusion.
Negative for heart failure.  Underlying COPD.
IMPRESSION: COPD with bibasilar scarring.  No acute infiltrate.  Small
pleural effusion.

## 2011-10-12 IMAGING — CR DG CHEST 2V
2 series · 2 of 2 positions shown · non-contrast
Comparison: 11/26/2010 and 01/24/2007

CLINICAL DATA: Cough.  Short of breath.  COPD.  Acute respiratory
failure.  Pneumonia

CHEST - 2 VIEW

[w chest lat]
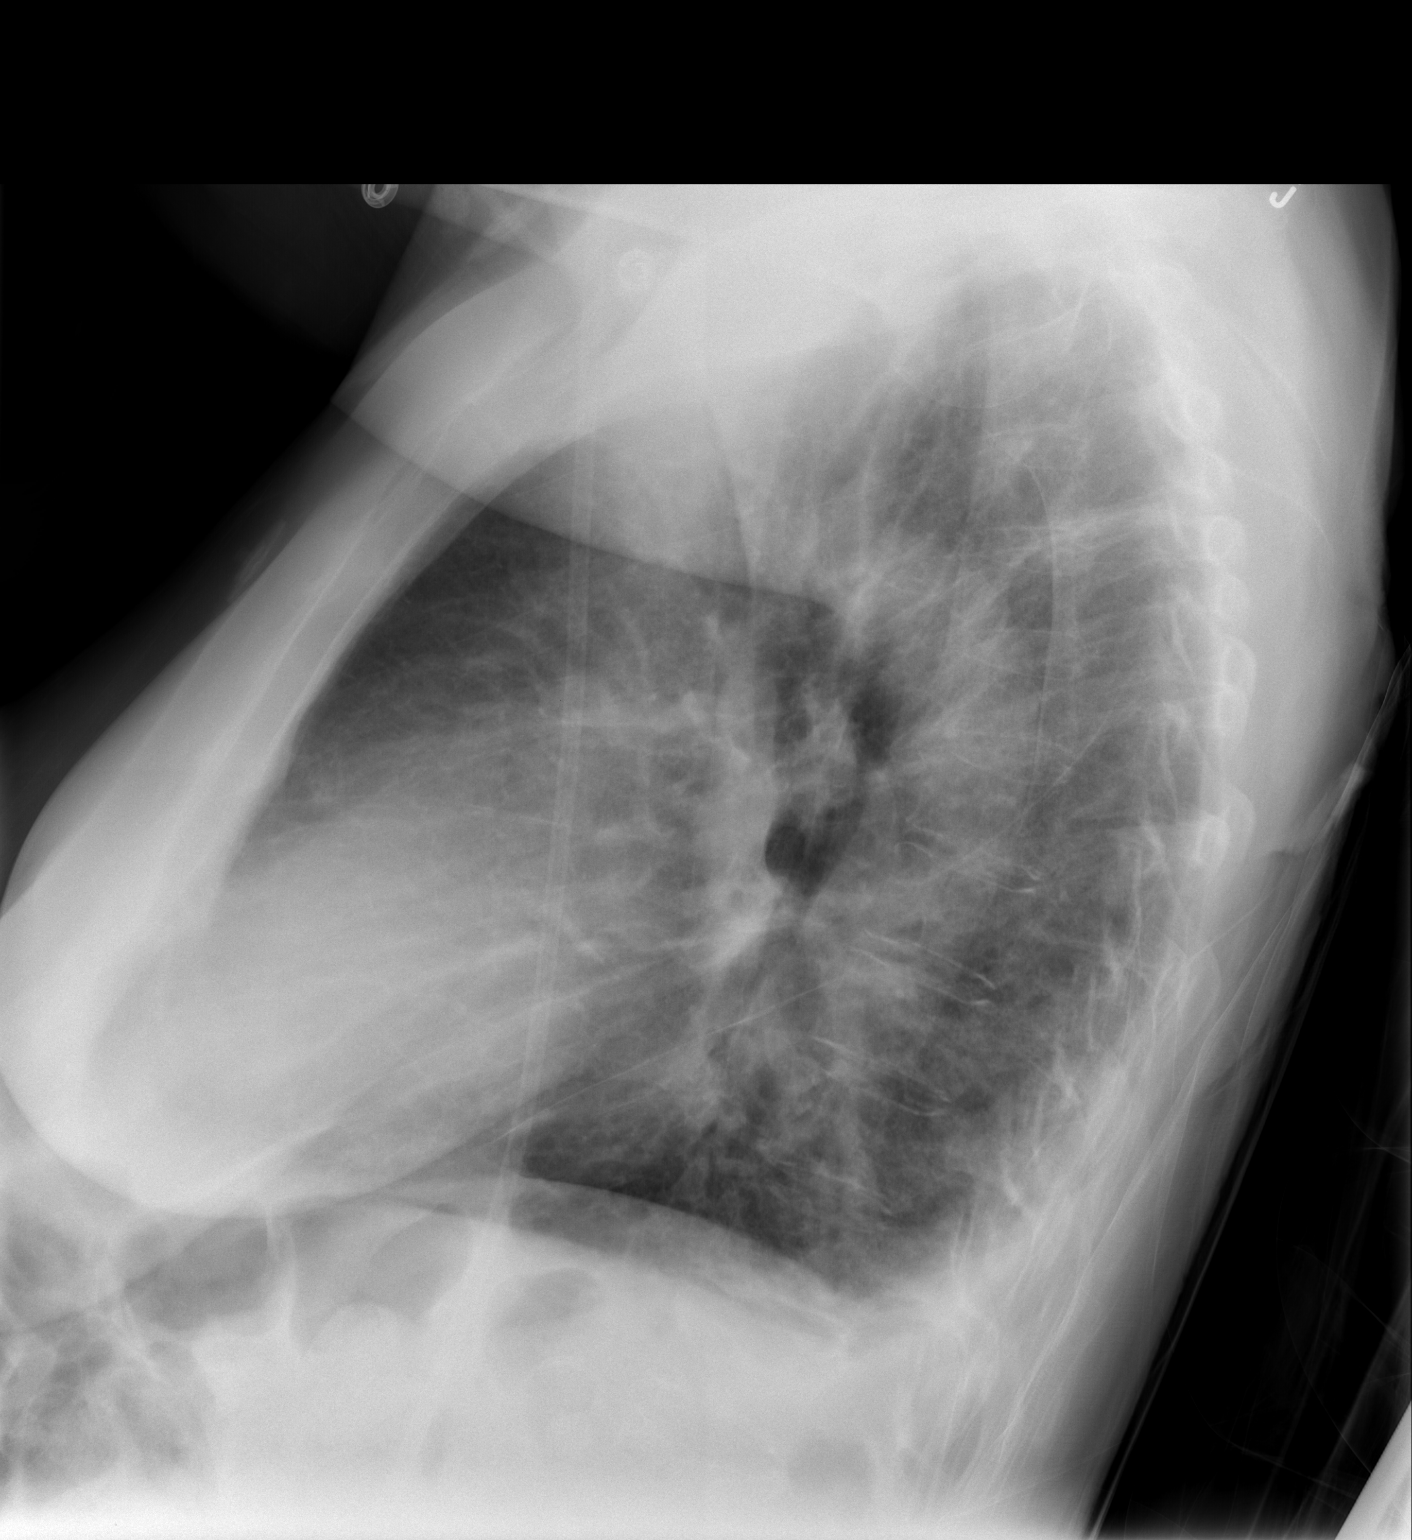

[view not recorded]
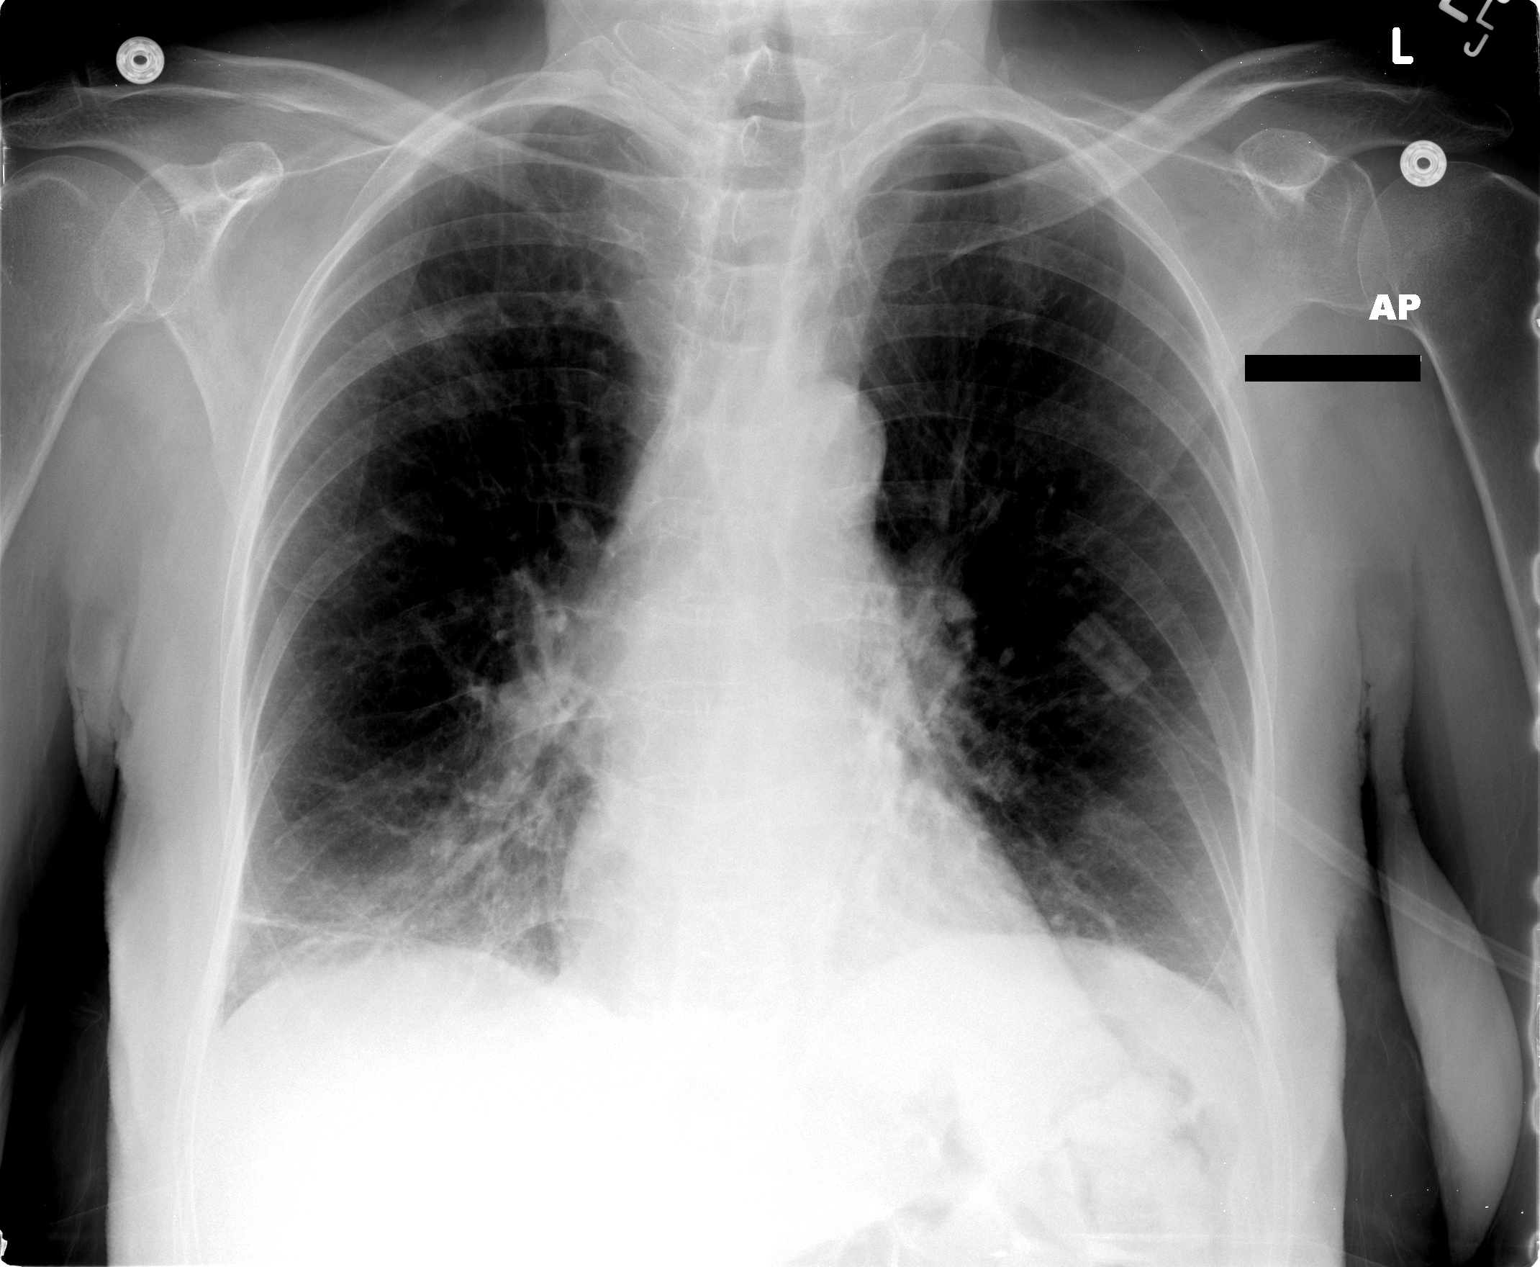

[2 of 2 positions shown; findings below may reference images not displayed]

FINDINGS: Changes of COPD are again seen.  Chronic bibasilar
scarring is again seen, right side greater than left, and is
unchanged.

A subtle ill-defined areas of pulmonary opacity are seen in both
upper lobes are not seen on prior exams, suspicious for there is a
developing infiltrate.  No evidence of pleural effusion.  Heart
size remains within normal limits.
IMPRESSION: 1.  New subtle bilateral upper lobe opacities, suspicious for areas
of developing infiltrate.  Continued radiographic followup is
recommended.
2.  Stable COPD and bibasilar scarring.

## 2012-10-22 ENCOUNTER — Emergency Department: Payer: Self-pay | Admitting: Emergency Medicine

## 2012-10-22 LAB — URINALYSIS, COMPLETE
Bacteria: NONE SEEN
Bilirubin,UR: NEGATIVE
Glucose,UR: NEGATIVE mg/dL (ref 0–75)
Ketone: NEGATIVE
Protein: 25
RBC,UR: 1 /HPF (ref 0–5)
Specific Gravity: 1.035 (ref 1.003–1.030)
WBC UR: 1 /HPF (ref 0–5)

## 2012-10-22 LAB — COMPREHENSIVE METABOLIC PANEL
Albumin: 4 g/dL (ref 3.4–5.0)
Alkaline Phosphatase: 112 U/L (ref 50–136)
Anion Gap: 8 (ref 7–16)
BUN: 33 mg/dL — ABNORMAL HIGH (ref 7–18)
Chloride: 109 mmol/L — ABNORMAL HIGH (ref 98–107)
Co2: 26 mmol/L (ref 21–32)
Creatinine: 0.66 mg/dL (ref 0.60–1.30)
EGFR (African American): >60
Glucose: 129 mg/dL — ABNORMAL HIGH (ref 65–99)
Osmolality: 294 (ref 275–301)
Potassium: 3.7 mmol/L (ref 3.5–5.1)
SGOT(AST): 36 U/L (ref 15–37)

## 2012-10-22 LAB — CBC
HGB: 14.5 g/dL (ref 12.0–16.0)
MCH: 31.1 pg (ref 26.0–34.0)
MCHC: 32.6 g/dL (ref 32.0–36.0)
RDW: 13.3 % (ref 11.5–14.5)
WBC: 12.8 10*3/uL — ABNORMAL HIGH (ref 3.6–11.0)

## 2012-10-22 LAB — LIPASE, BLOOD: Lipase: 102 U/L (ref 73–393)

## 2019-03-20 ENCOUNTER — Other Ambulatory Visit (HOSPITAL_COMMUNITY): Payer: Self-pay | Admitting: Neurology

## 2019-03-20 ENCOUNTER — Other Ambulatory Visit: Payer: Self-pay | Admitting: Neurology

## 2019-03-20 DIAGNOSIS — R29898 Other symptoms and signs involving the musculoskeletal system: Secondary | ICD-10-CM

## 2019-03-20 DIAGNOSIS — R200 Anesthesia of skin: Secondary | ICD-10-CM

## 2019-03-20 DIAGNOSIS — R202 Paresthesia of skin: Secondary | ICD-10-CM

## 2019-03-29 ENCOUNTER — Ambulatory Visit: Admission: RE | Admit: 2019-03-29 | Payer: Medicare Other | Source: Ambulatory Visit

## 2019-09-21 ENCOUNTER — Emergency Department: Payer: Medicare Other

## 2019-09-21 ENCOUNTER — Inpatient Hospital Stay
Admission: EM | Admit: 2019-09-21 | Discharge: 2019-09-27 | DRG: 522 | Disposition: A | Discharge status: Skilled Nursing Facility | Payer: Medicare Other | Source: Skilled Nursing Facility | Attending: Internal Medicine | Admitting: Internal Medicine

## 2019-09-21 ENCOUNTER — Encounter: Payer: Self-pay | Admitting: Emergency Medicine

## 2019-09-21 ENCOUNTER — Other Ambulatory Visit: Payer: Self-pay

## 2019-09-21 DIAGNOSIS — Z7982 Long term (current) use of aspirin: Secondary | ICD-10-CM

## 2019-09-21 DIAGNOSIS — W19XXXS Unspecified fall, sequela: Secondary | ICD-10-CM | POA: Diagnosis not present

## 2019-09-21 DIAGNOSIS — Z23 Encounter for immunization: Secondary | ICD-10-CM | POA: Diagnosis not present

## 2019-09-21 DIAGNOSIS — I959 Hypotension, unspecified: Secondary | ICD-10-CM | POA: Diagnosis present

## 2019-09-21 DIAGNOSIS — M25552 Pain in left hip: Secondary | ICD-10-CM | POA: Diagnosis present

## 2019-09-21 DIAGNOSIS — Z681 Body mass index (BMI) 19 or less, adult: Secondary | ICD-10-CM | POA: Diagnosis not present

## 2019-09-21 DIAGNOSIS — F0280 Dementia in other diseases classified elsewhere without behavioral disturbance: Secondary | ICD-10-CM | POA: Diagnosis present

## 2019-09-21 DIAGNOSIS — F329 Major depressive disorder, single episode, unspecified: Secondary | ICD-10-CM | POA: Diagnosis present

## 2019-09-21 DIAGNOSIS — Z87891 Personal history of nicotine dependence: Secondary | ICD-10-CM | POA: Diagnosis not present

## 2019-09-21 DIAGNOSIS — Y93E2 Activity, laundry: Secondary | ICD-10-CM | POA: Diagnosis not present

## 2019-09-21 DIAGNOSIS — S7222XA Displaced subtrochanteric fracture of left femur, initial encounter for closed fracture: Secondary | ICD-10-CM | POA: Diagnosis present

## 2019-09-21 DIAGNOSIS — Z96649 Presence of unspecified artificial hip joint: Secondary | ICD-10-CM

## 2019-09-21 DIAGNOSIS — W010XXA Fall on same level from slipping, tripping and stumbling without subsequent striking against object, initial encounter: Secondary | ICD-10-CM | POA: Diagnosis present

## 2019-09-21 DIAGNOSIS — R0902 Hypoxemia: Secondary | ICD-10-CM | POA: Diagnosis present

## 2019-09-21 DIAGNOSIS — G309 Alzheimer's disease, unspecified: Secondary | ICD-10-CM | POA: Diagnosis present

## 2019-09-21 DIAGNOSIS — J449 Chronic obstructive pulmonary disease, unspecified: Secondary | ICD-10-CM | POA: Diagnosis present

## 2019-09-21 DIAGNOSIS — Z79899 Other long term (current) drug therapy: Secondary | ICD-10-CM | POA: Diagnosis not present

## 2019-09-21 DIAGNOSIS — L89151 Pressure ulcer of sacral region, stage 1: Secondary | ICD-10-CM | POA: Diagnosis present

## 2019-09-21 DIAGNOSIS — N390 Urinary tract infection, site not specified: Secondary | ICD-10-CM | POA: Diagnosis present

## 2019-09-21 DIAGNOSIS — Z66 Do not resuscitate: Secondary | ICD-10-CM | POA: Diagnosis present

## 2019-09-21 DIAGNOSIS — Y92098 Other place in other non-institutional residence as the place of occurrence of the external cause: Secondary | ICD-10-CM

## 2019-09-21 DIAGNOSIS — R636 Underweight: Secondary | ICD-10-CM | POA: Diagnosis present

## 2019-09-21 DIAGNOSIS — D62 Acute posthemorrhagic anemia: Secondary | ICD-10-CM | POA: Diagnosis not present

## 2019-09-21 DIAGNOSIS — R52 Pain, unspecified: Secondary | ICD-10-CM

## 2019-09-21 DIAGNOSIS — Z90710 Acquired absence of both cervix and uterus: Secondary | ICD-10-CM | POA: Diagnosis not present

## 2019-09-21 DIAGNOSIS — Z20822 Contact with and (suspected) exposure to covid-19: Secondary | ICD-10-CM | POA: Diagnosis present

## 2019-09-21 DIAGNOSIS — W19XXXA Unspecified fall, initial encounter: Secondary | ICD-10-CM | POA: Diagnosis not present

## 2019-09-21 DIAGNOSIS — G300 Alzheimer's disease with early onset: Secondary | ICD-10-CM | POA: Diagnosis not present

## 2019-09-21 DIAGNOSIS — L8990 Pressure ulcer of unspecified site, unspecified stage: Secondary | ICD-10-CM | POA: Insufficient documentation

## 2019-09-21 DIAGNOSIS — S72002A Fracture of unspecified part of neck of left femur, initial encounter for closed fracture: Secondary | ICD-10-CM

## 2019-09-21 HISTORY — DX: Insomnia, unspecified: G47.00

## 2019-09-21 HISTORY — DX: Vomiting, unspecified: R11.10

## 2019-09-21 HISTORY — DX: Low back pain, unspecified: M54.50

## 2019-09-21 HISTORY — DX: Dementia in other diseases classified elsewhere, unspecified severity, without behavioral disturbance, psychotic disturbance, mood disturbance, and anxiety: F02.80

## 2019-09-21 LAB — CBC WITH DIFFERENTIAL/PLATELET
Abs Immature Granulocytes: 0.05 10*3/uL (ref 0.00–0.07)
Basophils Absolute: 0 10*3/uL (ref 0.0–0.1)
Basophils Relative: 0 %
Eosinophils Absolute: 0.2 10*3/uL (ref 0.0–0.5)
Eosinophils Relative: 2 %
HCT: 41 % (ref 36.0–46.0)
Hemoglobin: 13 g/dL (ref 12.0–15.0)
Immature Granulocytes: 1 %
Lymphocytes Relative: 29 %
Lymphs Abs: 2.9 10*3/uL (ref 0.7–4.0)
MCH: 28.7 pg (ref 26.0–34.0)
MCHC: 31.7 g/dL (ref 30.0–36.0)
MCV: 90.5 fL (ref 80.0–100.0)
Monocytes Absolute: 0.8 10*3/uL (ref 0.1–1.0)
Monocytes Relative: 8 %
Neutro Abs: 6.2 10*3/uL (ref 1.7–7.7)
Neutrophils Relative %: 60 %
Platelets: 327 10*3/uL (ref 150–400)
RBC: 4.53 MIL/uL (ref 3.87–5.11)
RDW: 13.7 % (ref 11.5–15.5)
WBC: 10.2 10*3/uL (ref 4.0–10.5)
nRBC: 0 % (ref 0.0–0.2)

## 2019-09-21 LAB — COMPREHENSIVE METABOLIC PANEL
ALT: 20 U/L (ref 0–44)
AST: 22 U/L (ref 15–41)
Albumin: 3.7 g/dL (ref 3.5–5.0)
Alkaline Phosphatase: 68 U/L (ref 38–126)
Anion gap: 11 (ref 5–15)
BUN: 22 mg/dL (ref 8–23)
CO2: 27 mmol/L (ref 22–32)
Calcium: 9.6 mg/dL (ref 8.9–10.3)
Chloride: 105 mmol/L (ref 98–111)
Creatinine, Ser: 0.78 mg/dL (ref 0.44–1.00)
GFR calc Af Amer: >60 mL/min (ref >60)
GFR calc non Af Amer: >60 mL/min (ref >60)
Glucose, Bld: 103 mg/dL — ABNORMAL HIGH (ref 70–99)
Potassium: 3.6 mmol/L (ref 3.5–5.1)
Sodium: 143 mmol/L (ref 135–145)
Total Bilirubin: 0.5 mg/dL (ref 0.3–1.2)
Total Protein: 6.9 g/dL (ref 6.5–8.1)

## 2019-09-21 LAB — MRSA PCR SCREENING: MRSA by PCR: NEGATIVE

## 2019-09-21 LAB — RESPIRATORY PANEL BY RT PCR (FLU A&B, COVID)
Influenza A by PCR: NEGATIVE
Influenza B by PCR: NEGATIVE
SARS Coronavirus 2 by RT PCR: NEGATIVE

## 2019-09-21 MED ORDER — SODIUM CHLORIDE 0.9 % IV SOLN: Freq: Once | INTRAVENOUS | Status: AC

## 2019-09-21 MED ORDER — POLYETHYLENE GLYCOL 3350 17 G PO PACK: 17.0000 g | PACK | Freq: Every day | ORAL | Status: DC | PRN

## 2019-09-21 MED ORDER — ENOXAPARIN SODIUM 40 MG/0.4ML ~~LOC~~ SOLN
40.0000 mg | Freq: Once | SUBCUTANEOUS | Status: AC
  Administered 2019-09-21: 40 mg via SUBCUTANEOUS
  Filled 2019-09-21: qty 0.4

## 2019-09-21 MED ORDER — PNEUMOCOCCAL VAC POLYVALENT 25 MCG/0.5ML IJ INJ
0.5000 mL | INJECTION | INTRAMUSCULAR | Status: AC
  Administered 2019-09-25: 0.5 mL via INTRAMUSCULAR
  Filled 2019-09-21: qty 0.5

## 2019-09-21 MED ORDER — HYDROCODONE-ACETAMINOPHEN 5-325 MG PO TABS
1.0000 | ORAL_TABLET | Freq: Four times a day (QID) | ORAL | Status: DC | PRN
  Administered 2019-09-22: 1 via ORAL
  Filled 2019-09-21: qty 1

## 2019-09-21 MED ORDER — MORPHINE SULFATE (PF) 4 MG/ML IV SOLN
4.0000 mg | Freq: Four times a day (QID) | INTRAVENOUS | Status: DC | PRN
  Administered 2019-09-21: 4 mg via INTRAVENOUS
  Filled 2019-09-21: qty 1

## 2019-09-21 MED ORDER — FENTANYL CITRATE (PF) 100 MCG/2ML IJ SOLN
25.0000 ug | Freq: Once | INTRAMUSCULAR | Status: AC
  Administered 2019-09-21: 25 ug via INTRAVENOUS
  Filled 2019-09-21: qty 2

## 2019-09-21 MED ORDER — ORAL CARE MOUTH RINSE
15.0000 mL | Freq: Two times a day (BID) | OROMUCOSAL | Status: DC
  Administered 2019-09-21 – 2019-09-26 (×9): 15 mL via OROMUCOSAL

## 2019-09-21 MED ORDER — ACETAMINOPHEN 325 MG PO TABS: 650.0000 mg | ORAL_TABLET | Freq: Four times a day (QID) | ORAL | Status: DC | PRN

## 2019-09-21 MED ORDER — ACETAMINOPHEN 500 MG PO TABS
1000.0000 mg | ORAL_TABLET | Freq: Once | ORAL | Status: AC
  Administered 2019-09-21: 1000 mg via ORAL
  Filled 2019-09-21: qty 2

## 2019-09-21 MED ORDER — CEFAZOLIN SODIUM-DEXTROSE 2-4 GM/100ML-% IV SOLN
2.0000 g | Freq: Once | INTRAVENOUS | Status: AC
  Administered 2019-09-22: 2 g via INTRAVENOUS
  Filled 2019-09-21: qty 100

## 2019-09-21 MED ORDER — ONDANSETRON HCL 4 MG/2ML IJ SOLN
4.0000 mg | Freq: Four times a day (QID) | INTRAMUSCULAR | Status: DC | PRN
  Administered 2019-09-21: 4 mg via INTRAVENOUS
  Filled 2019-09-21: qty 2

## 2019-09-21 MED ORDER — ONDANSETRON HCL 4 MG PO TABS: 4.0000 mg | ORAL_TABLET | Freq: Four times a day (QID) | ORAL | Status: DC | PRN

## 2019-09-21 MED ORDER — ACETAMINOPHEN 650 MG RE SUPP: 650.0000 mg | Freq: Four times a day (QID) | RECTAL | Status: DC | PRN

## 2019-09-21 NOTE — ED Provider Notes (Signed)
[facility] Emergency Department Provider Note  ____________________________________________   First MD Initiated Contact with Patient 09/21/19 1142     (approximate)  I have reviewed the triage vital signs and the nursing notes.   HISTORY  Chief Complaint Fall    HPI LUCIELLE VOKES is a 79 y.o. female  Here with hip pain, fall. Pt lives in SNF. She states that she was getting ready today when she slipped in her socks, landing on her L hip. Did not hit her head. She felt well prior to and since the fall. Reports aching, throbbing L hip pain with associated severe pain w/ any movement. She has not been able to ambulate on the hip. No alleviating factors.        Past Medical History:  Diagnosis Date  . Alzheimer's dementia (HCC)   . Insomnia   . Low back pain   . Vomiting     Patient Active Problem List   Diagnosis Date Noted  . Closed left subtrochanteric femur fracture (HCC) 09/21/2019    Past Surgical History:  Procedure Laterality Date  . VAGINAL HYSTERECTOMY      Prior to Admission medications   Medication Sig Start Date End Date Taking? Authorizing Provider  acetaminophen (TYLENOL) 500 MG tablet Take 500 mg by mouth every 4 (four) hours as needed for mild pain or fever.    Yes [provider]  alum & mag hydroxide-simeth (MINTOX) 200-200-20 MG/5ML suspension Take 30 mLs by mouth 4 (four) times daily as needed for indigestion or heartburn.   Yes [provider]  aspirin 81 MG chewable tablet Chew by mouth daily.   Yes [provider]  cholecalciferol (VITAMIN D3) 25 MCG (1000 UT) tablet Take 1,000 Units by mouth daily.   Yes [provider]  divalproex (DEPAKOTE) 125 MG DR tablet Take 125 mg by mouth 2 (two) times daily.   Yes [provider]  eszopiclone (LUNESTA) 2 MG TABS tablet Take 2 mg by mouth at bedtime. Take immediately before bedtime   Yes [provider]  guaifenesin  (ROBITUSSIN) 100 MG/5ML syrup Take 200 mg by mouth every 6 (six) hours as needed for cough.   Yes [provider]  lactulose, encephalopathy, (CHRONULAC) 10 GM/15ML SOLN Take 30 g by mouth daily.   Yes [provider]  loperamide (IMODIUM) 2 MG capsule Take 2 mg by mouth every 3 (three) hours as needed for diarrhea or loose stools.   Yes [provider]  magnesium hydroxide (MILK OF MAGNESIA) 400 MG/5ML suspension Take 30 mLs by mouth at bedtime as needed for mild constipation or moderate constipation.   Yes [provider]  Menthol-Zinc Oxide 0.44-20.625 % OINT Apply 1 application topically 4 (four) times daily as needed (skin irritation).   Yes [provider]  mirtazapine (REMERON) 30 MG tablet Take 30 mg by mouth daily.   Yes [provider]  neomycin-bacitracin-polymyxin (NEOSPORIN) ointment Apply 1 application topically 4 (four) times daily as needed for wound care.   Yes [provider]  OLANZapine (ZYPREXA) 5 MG tablet Take 5 mg by mouth at bedtime.   Yes [provider]  omeprazole (PRILOSEC) 20 MG capsule Take 20 mg by mouth daily before breakfast.   Yes [provider]  ondansetron (ZOFRAN-ODT) 4 MG disintegrating tablet Take 4 mg by mouth every 6 (six) hours as needed for nausea or vomiting.   Yes [provider]  polyethylene glycol (MIRALAX / GLYCOLAX) 17 g packet  Take 17 g by mouth at bedtime.   Yes [provider]  rivastigmine (EXELON) 6 MG capsule Take 6 mg by mouth 2 (two) times daily.   Yes [provider]  senna-docusate (SENOKOT-S) 8.6-50 MG tablet Take 2 tablets by mouth 2 (two) times daily.   Yes [provider]  vortioxetine HBr (TRINTELLIX) 20 MG TABS tablet Take 20 mg by mouth daily.   Yes [provider]    Allergies Patient has no known allergies.  History reviewed. No pertinent family history.  Social History Social History   Tobacco Use    . Smoking status: Former Games developer  . Smokeless tobacco: Never Used  Substance Use Topics  . Alcohol use: Not Currently  . Drug use: Not Currently    Review of Systems  Review of Systems  Constitutional: Negative for chills and fever.  HENT: Negative for sore throat.   Respiratory: Negative for shortness of breath.   Cardiovascular: Negative for chest pain.  Gastrointestinal: Negative for abdominal pain.  Genitourinary: Negative for flank pain.  Musculoskeletal: Positive for arthralgias and gait problem. Negative for neck pain.  Skin: Negative for rash and wound.  Allergic/Immunologic: Negative for immunocompromised state.  Neurological: Negative for weakness and numbness.  Hematological: Does not bruise/bleed easily.     ____________________________________________  PHYSICAL EXAM:      VITAL SIGNS: ED Triage Vitals  Enc Vitals Group     BP 09/21/19 0746 (!) 129/59     Pulse Rate 09/21/19 0746 72     Resp 09/21/19 0746 18     Temp 09/21/19 0746 98.6 F (37 C)     Temp Source 09/21/19 0746 Oral     SpO2 09/21/19 0746 (!) 89 %     Weight 09/21/19 0740 120 lb (54.4 kg)     Height 09/21/19 0740 5\' 6"  (1.676 m)     Head Circumference --      Peak Flow --      Pain Score 09/21/19 0740 5     Pain Loc --      Pain Edu? --      Excl. in GC? --      Physical Exam Vitals and nursing note reviewed.  Constitutional:      General: She is not in acute distress.    Appearance: She is well-developed.  HENT:     Head: Normocephalic and atraumatic.     Comments: No apparent head or neck trauma Eyes:     Conjunctiva/sclera: Conjunctivae normal.  Cardiovascular:     Rate and Rhythm: Normal rate and regular rhythm.     Heart sounds: Normal heart sounds. No murmur. No friction rub.  Pulmonary:     Effort: Pulmonary effort is normal. No respiratory distress.     Breath sounds: Normal breath sounds. No wheezing or rales.  Abdominal:     General: There is no distension.      Palpations: Abdomen is soft.     Tenderness: There is no abdominal tenderness.  Musculoskeletal:     Cervical back: Neck supple.  Skin:    General: Skin is warm.     Capillary Refill: Capillary refill takes less than 2 seconds.  Neurological:     Mental Status: She is alert and oriented to person, place, and time.     Motor: No abnormal muscle tone.      LOWER EXTREMITY EXAM: LEFT  INSPECTION & PALPATION: LLE shortened, externally rotated. Moderate TTP, worse with any pROM.  SENSORY: sensation is  intact to light touch in:  Superficial peroneal nerve distribution (over dorsum of foot) Deep peroneal nerve distribution (over first dorsal web space) Sural nerve distribution (over lateral aspect 5th metatarsal) Saphenous nerve distribution (over medial instep)  MOTOR:  + Motor EHL (great toe dorsiflexion) + FHL (great toe plantar flexion)  + TA (ankle dorsiflexion)  + GSC (ankle plantar flexion)  VASCULAR: 2+ dorsalis pedis and posterior tibialis pulses Capillary refill < 2 sec, toes warm and well-perfused  COMPARTMENTS: Soft, warm, well-perfused No pain with passive extension No parethesias    ____________________________________________   LABS (all labs ordered are listed, but only abnormal results are displayed)  Labs Reviewed  COMPREHENSIVE METABOLIC PANEL - Abnormal; Notable for the following components:      Result Value   Glucose, Bld 103 (*)    All other components within normal limits  RESPIRATORY PANEL BY RT PCR (FLU A&B, COVID)  CBC WITH DIFFERENTIAL/PLATELET  URINALYSIS, COMPLETE (UACMP) WITH MICROSCOPIC    ____________________________________________  EKG: Normal sinus rhythm, VR 68. Non-specific ST changes laterally, no overt ST elevations. ________________________________________  RADIOLOGY All imaging, including plain films, CT scans, and ultrasounds, independently reviewed by me, and interpretations confirmed via formal radiology reads.  ED  MD interpretation:   CXR: Clear, no active disease XR Femur/Pelvis: L subcapital hip fx  Official radiology report(s): DG Chest 1 View  Result Date: 09/21/2019 CLINICAL DATA:  Fall, pain EXAM: CHEST  1 VIEW COMPARISON:  10/22/2012 FINDINGS: Heart is normal size. There is hyperinflation of the lungs compatible with COPD. Aortic atherosclerosis. No confluent opacities or effusions. No acute bony abnormality. No pneumothorax. IMPRESSION: COPD.  No active disease. Electronically Signed   By: Charlett Nose M.D.   On: 09/21/2019 12:05   DG Pelvis 1-2 Views  Result Date: 09/21/2019 CLINICAL DATA:  Left hip pain after fall EXAM: LEFT FEMUR 2 VIEWS; PELVIS - 1-2 VIEW COMPARISON:  None. FINDINGS: Acute subcapital fracture of the proximal left femur with mild foreshortening. Alignment of the left hip joint is maintained without dislocation. Pelvic bony ring appears intact. SI joints and pubic symphysis are intact without diastasis. There is chondrocalcinosis at the pubic symphysis. Bones are diffusely demineralized. The more distal aspect of the femur is intact with anatomic alignment at the knee joint. No knee joint effusion. There are extensive vascular calcifications. IMPRESSION: Acute subcapital fracture of the proximal left femur with mild foreshortening. Electronically Signed   By: Duanne Guess D.O.   On: 09/21/2019 12:09   DG Femur Min 2 Views Left  Result Date: 09/21/2019 CLINICAL DATA:  Left hip pain after fall EXAM: LEFT FEMUR 2 VIEWS; PELVIS - 1-2 VIEW COMPARISON:  None. FINDINGS: Acute subcapital fracture of the proximal left femur with mild foreshortening. Alignment of the left hip joint is maintained without dislocation. Pelvic bony ring appears intact. SI joints and pubic symphysis are intact without diastasis. There is chondrocalcinosis at the pubic symphysis. Bones are diffusely demineralized. The more distal aspect of the femur is intact with anatomic alignment at the knee joint. No knee joint  effusion. There are extensive vascular calcifications. IMPRESSION: Acute subcapital fracture of the proximal left femur with mild foreshortening. Electronically Signed   By: Duanne Guess D.O.   On: 09/21/2019 12:09    ____________________________________________  PROCEDURES   Procedure(s) performed (including Critical Care):  Procedures  ____________________________________________  INITIAL IMPRESSION / MDM / ASSESSMENT AND PLAN / ED COURSE  As part of my medical decision making, I reviewed the following data within  the electronic MEDICAL RECORD NUMBER Nursing notes reviewed and incorporated, Old chart reviewed, Notes from prior ED visits, and [place] Controlled Substance Database       *HENDRIX CONSOLE was evaluated in Emergency Department on 09/21/2019 for the symptoms described in the history of present illness. She was evaluated in the context of the global COVID-19 pandemic, which necessitated consideration that the patient might be at risk for infection with the SARS-CoV-2 virus that causes COVID-19. Institutional protocols and algorithms that pertain to the evaluation of patients at risk for COVID-19 are in a state of rapid change based on information released by regulatory bodies including the CDC and federal and state organizations. These policies and algorithms were followed during the patient's care in the ED.  Some ED evaluations and interventions may be delayed as a result of limited staffing during the pandemic.*     Medical Decision Making:  79 yo F here with L hip fracture after non syncopal fall. Distal neurovasculature is intact. No other evidence of trauma. Dr. Roland Rack consulted, will admit to hospitalist service. Patient updated and in agreement.  ____________________________________________  FINAL CLINICAL IMPRESSION(S) / ED DIAGNOSES  Final diagnoses:  Fall, initial encounter  Closed fracture of left hip, initial encounter ([place])     MEDICATIONS GIVEN DURING THIS  VISIT:  Medications  ceFAZolin (ANCEF) IVPB 2g/100 mL premix (has no administration in time range)  0.9 %  sodium chloride infusion ( Intravenous Stopped 09/21/19 1504)  fentaNYL (SUBLIMAZE) injection 25 mcg (25 mcg Intravenous Given 09/21/19 1342)  acetaminophen (TYLENOL) tablet 1,000 mg (1,000 mg Oral Given 09/21/19 1342)     ED Discharge Orders    None       Note:  This document was prepared using Dragon voice recognition software and may include unintentional dictation errors.   Duffy Bruce, MD 09/21/19 [id number]

## 2019-09-21 NOTE — ED Notes (Signed)
Transportation requested at this time.  

## 2019-09-21 NOTE — ED Triage Notes (Addendum)
First RN Note: Pt presents to ED via [facility] with c/o fall from standing position from [place]. Per EMS no head injury, pt c/o pain from L hip to L knee, no shortening/no shortening. EMS reports pt is non-weight bearing to L leg at this time. Reports pain 5/10.   117/72 75   Pt states got up to put her laundry away and went to spin around and hit her L knee and L inner thigh, pt states pain is mostly in her inner thigh at this time and feels she can't bear weight due to pain in her thigh.

## 2019-09-21 NOTE — ED Notes (Signed)
Pt in room now . 

## 2019-09-21 NOTE — Consult Note (Addendum)
ORTHOPAEDIC CONSULTATION  REQUESTING PHYSICIAN: Jennye Boroughs, MD  Chief Complaint:   Left hip pain.  History of Present Illness: [name] is a 79 y.o. female with a history of early Alzheimer's dementia who lives at an assisted living facility.  The patient was in her usual state of health when she apparently slipped and fell on a hardwood floor while in stocking feet while doing her laundry.  She tried to get herself up but was unable to do so.  She was brought to the emergency room where x-rays demonstrated a left femoral neck fracture.  The patient denies any associated injuries.  She did not strike her head or lose consciousness.  She also denies any lightheadedness, dizziness, chest pain, shortness of breath, or other symptoms which may have precipitated her fall.  The patient normally ambulates with a walker.  Past Medical History:  Diagnosis Date  . Alzheimer's dementia ([place])   . Insomnia   . Low back pain   . Vomiting    Past Surgical History:  Procedure Laterality Date  . VAGINAL HYSTERECTOMY     Social History   Socioeconomic History  . Marital status: Divorced    Spouse name: Not on file  . Number of children: Not on file  . Years of education: Not on file  . Highest education level: Not on file  Occupational History  . Not on file  Tobacco Use  . Smoking status: Former Research scientist (life sciences)  . Smokeless tobacco: Never Used  Substance and Sexual Activity  . Alcohol use: Not Currently  . Drug use: Not Currently  . Sexual activity: Not on file  Other Topics Concern  . Not on file  Social History Narrative  . Not on file   Social Determinants of Health   Financial Resource Strain:   . Difficulty of Paying Living Expenses: Not on file  Food Insecurity:   . Worried About Charity fundraiser in the Last Year: Not on file  . Ran Out of Food in the Last Year: Not on file  Transportation Needs:   . Lack of  Transportation (Medical): Not on file  . Lack of Transportation (Non-Medical): Not on file  Physical Activity:   . Days of Exercise per Week: Not on file  . Minutes of Exercise per Session: Not on file  Stress:   . Feeling of Stress : Not on file  Social Connections:   . Frequency of Communication with Friends and Family: Not on file  . Frequency of Social Gatherings with Friends and Family: Not on file  . Attends Religious Services: Not on file  . Active Member of Clubs or Organizations: Not on file  . Attends Archivist Meetings: Not on file  . Marital Status: Not on file   History reviewed. No pertinent family history. No Known Allergies Prior to Admission medications   Medication Sig Start Date End Date Taking? Authorizing Provider  acetaminophen (TYLENOL) 500 MG tablet Take 500 mg by mouth every 4 (four) hours as needed for mild pain or fever.    Yes [provider]  alum & mag hydroxide-simeth (MINTOX) 200-200-20 MG/5ML suspension Take 30 mLs by mouth 4 (four) times daily as needed for indigestion or heartburn.   Yes [provider]  aspirin 81 MG chewable tablet Chew by mouth daily.   Yes [provider]  cholecalciferol (VITAMIN D3) 25 MCG (1000 UT) tablet Take 1,000 Units by mouth daily.   Yes [provider]  divalproex (  DEPAKOTE) 125 MG DR tablet Take 125 mg by mouth 2 (two) times daily.   Yes [provider]  eszopiclone (LUNESTA) 2 MG TABS tablet Take 2 mg by mouth at bedtime. Take immediately before bedtime   Yes [provider]  guaifenesin (ROBITUSSIN) 100 MG/5ML syrup Take 200 mg by mouth every 6 (six) hours as needed for cough.   Yes [provider]  lactulose, encephalopathy, (CHRONULAC) 10 GM/15ML SOLN Take 30 g by mouth daily.   Yes [provider]  loperamide (IMODIUM) 2 MG capsule Take 2 mg by mouth every 3 (three) hours as needed for diarrhea or loose stools.   Yes [provider]  magnesium hydroxide (MILK OF MAGNESIA) 400 MG/5ML suspension Take 30 mLs by mouth at bedtime as needed for mild constipation or moderate constipation.   Yes [provider]  Menthol-Zinc Oxide 0.44-20.625 % OINT Apply 1 application topically 4 (four) times daily as needed (skin irritation).   Yes [provider]  mirtazapine (REMERON) 30 MG tablet Take 30 mg by mouth daily.   Yes [provider]  neomycin-bacitracin-polymyxin (NEOSPORIN) ointment Apply 1 application topically 4 (four) times daily as needed for wound care.   Yes [provider]  OLANZapine (ZYPREXA) 5 MG tablet Take 5 mg by mouth at bedtime.   Yes [provider]  omeprazole (PRILOSEC) 20 MG capsule Take 20 mg by mouth daily before breakfast.   Yes [provider]  ondansetron (ZOFRAN-ODT) 4 MG disintegrating tablet Take 4 mg by mouth every 6 (six) hours as needed for nausea or vomiting.   Yes [provider]  polyethylene glycol (MIRALAX / GLYCOLAX) 17 g packet Take 17 g by mouth at bedtime.   Yes [provider]  rivastigmine (EXELON) 6 MG capsule Take 6 mg by mouth 2 (two) times daily.   Yes [provider]  senna-docusate (SENOKOT-S) 8.6-50 MG tablet Take 2 tablets by mouth 2 (two) times daily.   Yes [provider]  vortioxetine HBr (TRINTELLIX) 20 MG TABS tablet Take 20 mg by mouth daily.   Yes [provider]   DG Chest 1 View  Result Date: 09/21/2019 CLINICAL DATA:  Fall, pain EXAM: CHEST  1 VIEW COMPARISON:  10/22/2012 FINDINGS: Heart is normal size. There is hyperinflation of the lungs compatible with COPD. Aortic atherosclerosis. No confluent opacities or effusions. No acute bony abnormality. No pneumothorax. IMPRESSION: COPD.  No active disease. Electronically Signed   By: Charlett Nose M.D.   On: 09/21/2019 12:05   DG Pelvis 1-2 Views  Result Date: 09/21/2019 CLINICAL DATA:  Left hip pain after fall EXAM:  LEFT FEMUR 2 VIEWS; PELVIS - 1-2 VIEW COMPARISON:  None. FINDINGS: Acute subcapital fracture of the proximal left femur with mild foreshortening. Alignment of the left hip joint is maintained without dislocation. Pelvic bony ring appears intact. SI joints and pubic symphysis are intact without diastasis. There is chondrocalcinosis at the pubic symphysis. Bones are diffusely demineralized. The more distal aspect of the femur is intact with anatomic alignment at the knee joint. No knee joint effusion. There are extensive vascular calcifications. IMPRESSION: Acute subcapital fracture of the proximal left femur with mild foreshortening. Electronically Signed   By: Duanne Guess D.O.   On: 09/21/2019 12:09   DG Femur Min 2 Views Left  Result Date: 09/21/2019 CLINICAL DATA:  Left hip pain after fall EXAM: LEFT FEMUR 2 VIEWS; PELVIS - 1-2 VIEW COMPARISON:  None. FINDINGS: Acute subcapital fracture of the  proximal left femur with mild foreshortening. Alignment of the left hip joint is maintained without dislocation. Pelvic bony ring appears intact. SI joints and pubic symphysis are intact without diastasis. There is chondrocalcinosis at the pubic symphysis. Bones are diffusely demineralized. The more distal aspect of the femur is intact with anatomic alignment at the knee joint. No knee joint effusion. There are extensive vascular calcifications. IMPRESSION: Acute subcapital fracture of the proximal left femur with mild foreshortening. Electronically Signed   By: Duanne Guess D.O.   On: 09/21/2019 12:09    Positive ROS: All other systems have been reviewed and were otherwise negative with the exception of those mentioned in the HPI and as above.  Physical Exam: General:  Alert, no acute distress Psychiatric:  Patient is competent for consent with normal mood and affect   Cardiovascular:  No pedal edema Respiratory:  No wheezing, non-labored breathing GI:  Abdomen is soft and non-tender Skin:  No lesions  in the area of chief complaint Neurologic:  Sensation intact distally Lymphatic:  No axillary or cervical lymphadenopathy  Orthopedic Exam:  Orthopedic examination is limited to the left hip and lower extremity.  The left lower extremity is somewhat shortened and externally rotated as compared to the right.  Skin inspection around the left hip is unremarkable.  No swelling, erythema, ecchymosis, abrasions, or other skin abnormalities are identified.  She has mild tenderness to palpation of the lateral aspect of the left hip.  She has more severe pain with any attempted active or passive motion of the hip.  She is able dorsiflex and plantarflex her toes and ankle.  Sensation is intact light touch to all distributions.  She has good capillary refill to her left foot.  X-rays:  X-rays of the pelvis and left hip are available for review and have been reviewed by myself.  These films demonstrate a varus displaced subcapital fracture of the left hip.  No significant degenerative changes are identified.  No lytic lesions or other acute bony abnormalities are noted.  Assessment: Displaced subcapital fracture, left hip.  Plan: The treatment options have been discussed with the patient, including both surgical and nonsurgical choices.  The patient like to proceed with surgical intervention to include a left hip hemiarthroplasty.  This procedure has been discussed in detail, as have the potential risks (including bleeding, infection, nerve and/or blood vessel injury, persistent or recurrent pain, stiffness, dislocation, leg length inequality, loosening of and/or failure of the components, need for further surgery, blood clots, strokes, heart attacks and/or arrhythmias, etc.) and benefits.  The patient states her understanding and wishes to proceed.  A formal written consent will be obtained by the nursing staff.  Thank you for asking me to participate in the care of this most pleasant woman.  I will be happy to  follow her with you.   Maryagnes Amos, MD  Beeper #:  [phone]  09/21/2019 4:06 PM

## 2019-09-21 NOTE — ED Notes (Signed)
Spoke with Dr. Don Perking regarding patient care, VORB for bloodwork and imaging.

## 2019-09-21 NOTE — ED Notes (Signed)
Pt c/o left hip pain, states "I just can't get comfortable." Pt repositioned for comfort, ice pack applied to left femur. No obvious bruising or swelling noted at this time- CMS intact.

## 2019-09-21 NOTE — ED Notes (Signed)
Pt provided ice chips- educated on NPO status for surgery. Pt verbalizes understanding.

## 2019-09-21 NOTE — ED Notes (Signed)
Pt placed on 2L via [place].  

## 2019-09-21 NOTE — ED Notes (Signed)
[facility]) called at this time for update- update provid- facility notified pt will be admitted for femur fracture.

## 2019-09-21 NOTE — ED Notes (Signed)
Report to Casey, RN.

## 2019-09-21 NOTE — ED Notes (Signed)
Pt changed into gown and nonskid socks. Surgical consent with paperwork- needs physician signature.

## 2019-09-21 NOTE — ED Notes (Signed)
Assumed care of pt at this time per charge RN from triage- pt not in room yet.

## 2019-09-21 NOTE — H&P (Addendum)
History and Physical:    [name]   LKG:[id number] DOB: [birth year] DOA: 09/21/2019  Referring MD/provider: Duffy Bruce, MD PCP: Patient, No Pcp Per   Patient coming from: [facility]  Chief Complaint: Left hip pain status post fall  History of Present Illness:   [name] is an 79 y.o. female with medical history significant for low back pain, Alzheimer's dementia, presented today hospital after a fall at [facility].  She said she was wearing sneakers socks without any shoes on when she fell.  She said she was putting her laundry away when she slipped and fell.  She developed excruciating, sharp pain in the left hip after the fall.  There were no known relieving factors for her pain and pain was worse with movement.  It is nonradiating.  She did not have any other symptoms prior to or after the fall.  No chest pain, shortness of breath, palpitations, dizziness, syncope, unilateral weakness or numbness in the extremities.  ED Course:  The patient was found to have stable vital signs except that her oxygen saturation was slightly low at 89% on room air.  She was found to have left femoral neck fracture on x-ray of the hip and pelvis.  Given Tylenol, IV fentanyl and IV fluids in the emergency room.  ROS:   ROS all other systems reviewed were negative  Past Medical History:   Past Medical History:  Diagnosis Date  . Alzheimer's dementia ([place])   . Insomnia   . Low back pain   . Vomiting     Past Surgical History:   Past Surgical History:  Procedure Laterality Date  . VAGINAL HYSTERECTOMY      Social History:   Social History   Socioeconomic History  . Marital status: Divorced    Spouse name: Not on file  . Number of children: Not on file  . Years of education: Not on file  . Highest education level: Not on file  Occupational History  . Not on file  Tobacco Use  . Smoking status: Former Research scientist (life sciences)  . Smokeless tobacco: Never Used  Substance  and Sexual Activity  . Alcohol use: Not Currently  . Drug use: Not Currently  . Sexual activity: Not on file  Other Topics Concern  . Not on file  Social History Narrative  . Not on file   Social Determinants of Health   Financial Resource Strain:   . Difficulty of Paying Living Expenses: Not on file  Food Insecurity:   . Worried About Charity fundraiser in the Last Year: Not on file  . Ran Out of Food in the Last Year: Not on file  Transportation Needs:   . Lack of Transportation (Medical): Not on file  . Lack of Transportation (Non-Medical): Not on file  Physical Activity:   . Days of Exercise per Week: Not on file  . Minutes of Exercise per Session: Not on file  Stress:   . Feeling of Stress : Not on file  Social Connections:   . Frequency of Communication with Friends and Family: Not on file  . Frequency of Social Gatherings with Friends and Family: Not on file  . Attends Religious Services: Not on file  . Active Member of Clubs or Organizations: Not on file  . Attends Archivist Meetings: Not on file  . Marital Status: Not on file  Intimate Partner Violence:   . Fear of Current or Ex-Partner: Not on file  .  Emotionally Abused: Not on file  . Physically Abused: Not on file  . Sexually Abused: Not on file    Allergies   Patient has no known allergies.  Family history:   History reviewed. No pertinent family history.  Current Medications:   Prior to Admission medications   Not on File    Physical Exam:   Vitals:   09/21/19 1400 09/21/19 1415 09/21/19 1430 09/21/19 1500  BP: 132/70  128/62 (!) 115/58  Pulse: 80 80 73 76  Resp: 18 18 10 19   Temp:      TempSrc:      SpO2: 93% 95% 96% 93%  Weight:      Height:         Physical Exam: Blood pressure (!) 115/58, pulse 76, temperature 98.6 F (37 C), temperature source Oral, resp. rate 19, height 5\' 6"  (1.676 m), weight 54.4 kg, SpO2 93 %. Gen: No acute distress. Head: Normocephalic,  atraumatic. Eyes: Pupils equal, round and reactive to light. Extraocular movements intact.  Sclerae nonicteric.  Mouth: Moist mucous membranes Neck: Supple, no thyromegaly, no lymphadenopathy, no jugular venous distention. Chest: Lungs are clear to auscultation with good air movement. No rales, rhonchi or wheezes.  CV: Heart sounds are regular with an S1, S2. No murmurs, rubs or gallops.  Abdomen: Soft, nontender, nondistended with normal active bowel sounds. No palpable masses. Extremities: Extremities are without clubbing, or cyanosis. No edema. Pedal pulses 2+.  Left lower extremity appears slightly shorter than the right.  There is no erythema swelling or tenderness of the left hip. Skin: Warm and dry. No rashes, lesions or wounds Neuro: Alert and oriented times 3; grossly nonfocal.  Psych: Insight is good and judgment is appropriate. Mood and affect normal.   Data Review:    Labs: Basic Metabolic Panel: Recent Labs  Lab 09/21/19 0802  NA 143  K 3.6  CL 105  CO2 27  GLUCOSE 103*  BUN 22  CREATININE 0.78  CALCIUM 9.6   Liver Function Tests: Recent Labs  Lab 09/21/19 0802  AST 22  ALT 20  ALKPHOS 68  BILITOT 0.5  PROT 6.9  ALBUMIN 3.7   No results for input(s): LIPASE, AMYLASE in the last 168 hours. No results for input(s): AMMONIA in the last 168 hours. CBC: Recent Labs  Lab 09/21/19 0802  WBC 10.2  NEUTROABS 6.2  HGB 13.0  HCT 41.0  MCV 90.5  PLT 327   Cardiac Enzymes: No results for input(s): CKTOTAL, CKMB, CKMBINDEX, TROPONINI in the last 168 hours.  BNP (last 3 results) No results for input(s): PROBNP in the last 8760 hours. CBG: No results for input(s): GLUCAP in the last 168 hours.  Urinalysis    Component Value Date/Time   COLORURINE Amber 10/22/2012 0404   COLORURINE YELLOW 06/14/2011 1805   APPEARANCEUR Clear 10/22/2012 0404   LABSPEC 1.035 10/22/2012 0404   PHURINE 5.0 10/22/2012 0404   PHURINE 5.0 06/14/2011 1805   GLUCOSEU Negative  10/22/2012 0404   HGBUR Negative 10/22/2012 0404   HGBUR NEGATIVE 06/14/2011 1805   BILIRUBINUR Negative 10/22/2012 0404   KETONESUR Negative 10/22/2012 0404   KETONESUR TRACE (A) 06/14/2011 1805   PROTEINUR 25 mg/dL 12/20/2012 08/14/2011   PROTEINUR NEGATIVE 06/14/2011 1805   UROBILINOGEN 0.2 06/14/2011 1805   NITRITE Negative 10/22/2012 0404   NITRITE POSITIVE (A) 06/14/2011 1805   LEUKOCYTESUR Negative 10/22/2012 0404      Radiographic Studies: DG Chest 1 View  Result Date: 09/21/2019 CLINICAL DATA:  Fall,  pain EXAM: CHEST  1 VIEW COMPARISON:  10/22/2012 FINDINGS: Heart is normal size. There is hyperinflation of the lungs compatible with COPD. Aortic atherosclerosis. No confluent opacities or effusions. No acute bony abnormality. No pneumothorax. IMPRESSION: COPD.  No active disease. Electronically Signed   By: Charlett Nose M.D.   On: 09/21/2019 12:05   DG Pelvis 1-2 Views  Result Date: 09/21/2019 CLINICAL DATA:  Left hip pain after fall EXAM: LEFT FEMUR 2 VIEWS; PELVIS - 1-2 VIEW COMPARISON:  None. FINDINGS: Acute subcapital fracture of the proximal left femur with mild foreshortening. Alignment of the left hip joint is maintained without dislocation. Pelvic bony ring appears intact. SI joints and pubic symphysis are intact without diastasis. There is chondrocalcinosis at the pubic symphysis. Bones are diffusely demineralized. The more distal aspect of the femur is intact with anatomic alignment at the knee joint. No knee joint effusion. There are extensive vascular calcifications. IMPRESSION: Acute subcapital fracture of the proximal left femur with mild foreshortening. Electronically Signed   By: Duanne Guess D.O.   On: 09/21/2019 12:09   DG Femur Min 2 Views Left  Result Date: 09/21/2019 CLINICAL DATA:  Left hip pain after fall EXAM: LEFT FEMUR 2 VIEWS; PELVIS - 1-2 VIEW COMPARISON:  None. FINDINGS: Acute subcapital fracture of the proximal left femur with mild foreshortening. Alignment  of the left hip joint is maintained without dislocation. Pelvic bony ring appears intact. SI joints and pubic symphysis are intact without diastasis. There is chondrocalcinosis at the pubic symphysis. Bones are diffusely demineralized. The more distal aspect of the femur is intact with anatomic alignment at the knee joint. No knee joint effusion. There are extensive vascular calcifications. IMPRESSION: Acute subcapital fracture of the proximal left femur with mild foreshortening. Electronically Signed   By: Duanne Guess D.O.   On: 09/21/2019 12:09    EKG: Independently reviewed.  Suspect normal sinus rhythm with artifacts   Assessment/Plan:   Active Problems:   Closed left subtrochanteric femur fracture (HCC)   Body mass index is 19.37 kg/m.   Acute left subcapital fracture of the proximal left femur: Admit to MedSurg.  Analgesics as needed for pain.  Orthopedic surgeon has been consulted by ED physician with plan for surgical intervention tomorrow.  Mild hypoxemia: Oxygen via nasal cannula as needed.  Chest x-ray was unremarkable.  She has no respiratory symptoms.  Continue to monitor.  Alzheimer's dementia: Supportive care.   Other information:   DVT prophylaxis: Lovenox Code Status: DNR. Family Communication: Plan discussed with the patient Disposition Plan: Possible discharge to SNF in 3 to 4 days Consults called: Orthopedic surgeon Admission status: Inpatient    The medical decision making is of moderate complexity, therefore this is a level 2 visit.  Time spent 45 minutes  Kinzlee Selvy [facility]   How to contact the [facility] Attending or Consulting provider 7A - 7P or covering provider during after hours 7P -7A, for this patient?   1. Check the care team in [facility] and look for a) attending/consulting [facility] provider listed and b) the [facility] team listed 2. Log into [email or web address] and use [facility]'s universal password to access. If you do not have the password,  please contact the hospital operator. 3. Locate the [facility] provider you are looking for under [facility] and page to a number that you can be directly reached. 4. If you still have difficulty reaching the provider, please page the [facility] (Director on Call) for the Hospitalists listed on amion for assistance.  09/21/2019, 3:18 PM

## 2019-09-22 ENCOUNTER — Inpatient Hospital Stay: Payer: Medicare Other

## 2019-09-22 ENCOUNTER — Encounter
Admission: EM | Disposition: A | Discharge status: Skilled Nursing Facility | Payer: Self-pay | Source: Skilled Nursing Facility | Attending: Internal Medicine

## 2019-09-22 ENCOUNTER — Inpatient Hospital Stay: Payer: Medicare Other | Admitting: Anesthesiology

## 2019-09-22 ENCOUNTER — Encounter: Payer: Self-pay | Admitting: Internal Medicine

## 2019-09-22 DIAGNOSIS — W19XXXA Unspecified fall, initial encounter: Secondary | ICD-10-CM

## 2019-09-22 HISTORY — PX: HIP ARTHROPLASTY: SHX981

## 2019-09-22 LAB — CBC
HCT: 34.5 % — ABNORMAL LOW (ref 36.0–46.0)
Hemoglobin: 10.9 g/dL — ABNORMAL LOW (ref 12.0–15.0)
MCH: 28.6 pg (ref 26.0–34.0)
MCHC: 31.6 g/dL (ref 30.0–36.0)
MCV: 90.6 fL (ref 80.0–100.0)
Platelets: 238 10*3/uL (ref 150–400)
RBC: 3.81 MIL/uL — ABNORMAL LOW (ref 3.87–5.11)
RDW: 13.8 % (ref 11.5–15.5)
WBC: 9.7 10*3/uL (ref 4.0–10.5)
nRBC: 0 % (ref 0.0–0.2)

## 2019-09-22 LAB — BASIC METABOLIC PANEL
Anion gap: 8 (ref 5–15)
BUN: 22 mg/dL (ref 8–23)
CO2: 24 mmol/L (ref 22–32)
Calcium: 8.5 mg/dL — ABNORMAL LOW (ref 8.9–10.3)
Chloride: 108 mmol/L (ref 98–111)
Creatinine, Ser: 0.73 mg/dL (ref 0.44–1.00)
GFR calc Af Amer: >60 mL/min (ref >60)
GFR calc non Af Amer: >60 mL/min (ref >60)
Glucose, Bld: 91 mg/dL (ref 70–99)
Potassium: 3.9 mmol/L (ref 3.5–5.1)
Sodium: 140 mmol/L (ref 135–145)

## 2019-09-22 SURGERY — HEMIARTHROPLASTY, HIP, DIRECT ANTERIOR APPROACH, FOR FRACTURE
Anesthesia: Spinal | Site: Hip | Laterality: Left

## 2019-09-22 MED ORDER — ONDANSETRON HCL 4 MG/2ML IJ SOLN: 4.0000 mg | Freq: Once | INTRAMUSCULAR | Status: DC | PRN

## 2019-09-22 MED ORDER — SODIUM CHLORIDE (PF) 0.9 % IJ SOLN
INTRAMUSCULAR | Status: AC
  Filled 2019-09-22: qty 50

## 2019-09-22 MED ORDER — POLYETHYLENE GLYCOL 3350 17 G PO PACK
17.0000 g | PACK | Freq: Two times a day (BID) | ORAL | Status: DC
  Administered 2019-09-22 – 2019-09-24 (×3): 17 g via ORAL
  Filled 2019-09-22 (×4): qty 1

## 2019-09-22 MED ORDER — PHENYLEPHRINE HCL-NACL 10-0.9 MG/250ML-% IV SOLN
INTRAVENOUS | Status: DC | PRN
  Administered 2019-09-22: 50 ug/min via INTRAVENOUS

## 2019-09-22 MED ORDER — BISACODYL 10 MG RE SUPP
10.0000 mg | Freq: Every day | RECTAL | Status: DC | PRN
  Administered 2019-09-24: 10 mg via RECTAL
  Filled 2019-09-22: qty 1

## 2019-09-22 MED ORDER — FENTANYL CITRATE (PF) 100 MCG/2ML IJ SOLN: 25.0000 ug | INTRAMUSCULAR | Status: DC | PRN

## 2019-09-22 MED ORDER — PANTOPRAZOLE SODIUM 40 MG PO TBEC
40.0000 mg | DELAYED_RELEASE_TABLET | Freq: Every day | ORAL | Status: DC
  Administered 2019-09-23 – 2019-09-27 (×5): 40 mg via ORAL
  Filled 2019-09-22 (×5): qty 1

## 2019-09-22 MED ORDER — BUPIVACAINE HCL (PF) 0.5 % IJ SOLN
INTRAMUSCULAR | Status: AC
  Filled 2019-09-22: qty 10

## 2019-09-22 MED ORDER — BUPIVACAINE HCL (PF) 0.5 % IJ SOLN
INTRAMUSCULAR | Status: AC
  Filled 2019-09-22: qty 30

## 2019-09-22 MED ORDER — SODIUM CHLORIDE 0.9 % IV SOLN: INTRAVENOUS | Status: DC

## 2019-09-22 MED ORDER — ACETAMINOPHEN 160 MG/5ML PO SOLN
325.0000 mg | ORAL | Status: DC | PRN
  Filled 2019-09-22: qty 20.3

## 2019-09-22 MED ORDER — DIVALPROEX SODIUM 125 MG PO DR TAB
125.0000 mg | DELAYED_RELEASE_TABLET | Freq: Two times a day (BID) | ORAL | Status: DC
  Administered 2019-09-22 – 2019-09-27 (×10): 125 mg via ORAL
  Filled 2019-09-22 (×12): qty 1

## 2019-09-22 MED ORDER — BUPIVACAINE LIPOSOME 1.3 % IJ SUSP
INTRAMUSCULAR | Status: DC | PRN
  Administered 2019-09-22: 20 mL

## 2019-09-22 MED ORDER — ONDANSETRON HCL 4 MG/2ML IJ SOLN: 4.0000 mg | Freq: Four times a day (QID) | INTRAMUSCULAR | Status: DC | PRN

## 2019-09-22 MED ORDER — LACTATED RINGERS IV SOLN: INTRAVENOUS | Status: DC | PRN

## 2019-09-22 MED ORDER — CEFAZOLIN SODIUM 1 G IJ SOLR
INTRAMUSCULAR | Status: AC
  Filled 2019-09-22: qty 20

## 2019-09-22 MED ORDER — HYDROCODONE-ACETAMINOPHEN 7.5-325 MG PO TABS
1.0000 | ORAL_TABLET | Freq: Once | ORAL | Status: DC | PRN
  Filled 2019-09-22: qty 1

## 2019-09-22 MED ORDER — MIDAZOLAM HCL 5 MG/5ML IJ SOLN
INTRAMUSCULAR | Status: DC | PRN
  Administered 2019-09-22 (×2): 1 mg via INTRAVENOUS

## 2019-09-22 MED ORDER — MIRTAZAPINE 15 MG PO TABS
30.0000 mg | ORAL_TABLET | Freq: Every day | ORAL | Status: DC
  Administered 2019-09-23 – 2019-09-27 (×5): 30 mg via ORAL
  Filled 2019-09-22 (×5): qty 2

## 2019-09-22 MED ORDER — LACTULOSE 10 GM/15ML PO SOLN
30.0000 g | Freq: Every day | ORAL | Status: DC
  Administered 2019-09-23 – 2019-09-24 (×2): 30 g via ORAL
  Filled 2019-09-22 (×2): qty 60

## 2019-09-22 MED ORDER — EPINEPHRINE PF 1 MG/ML IJ SOLN
INTRAMUSCULAR | Status: AC
  Filled 2019-09-22: qty 1

## 2019-09-22 MED ORDER — METOCLOPRAMIDE HCL 5 MG/ML IJ SOLN: 5.0000 mg | Freq: Three times a day (TID) | INTRAMUSCULAR | Status: DC | PRN

## 2019-09-22 MED ORDER — MAGNESIUM HYDROXIDE 400 MG/5ML PO SUSP: 30.0000 mL | Freq: Every day | ORAL | Status: DC | PRN

## 2019-09-22 MED ORDER — PHENYLEPHRINE HCL (PRESSORS) 10 MG/ML IV SOLN
INTRAVENOUS | Status: DC | PRN
  Administered 2019-09-22: 100 ug via INTRAVENOUS

## 2019-09-22 MED ORDER — ACETAMINOPHEN 500 MG PO TABS
500.0000 mg | ORAL_TABLET | Freq: Four times a day (QID) | ORAL | Status: AC
  Administered 2019-09-22 – 2019-09-23 (×3): 500 mg via ORAL
  Filled 2019-09-22 (×3): qty 1

## 2019-09-22 MED ORDER — ASPIRIN 81 MG PO CHEW
81.0000 mg | CHEWABLE_TABLET | Freq: Every day | ORAL | Status: DC
  Administered 2019-09-23 – 2019-09-27 (×5): 81 mg via ORAL
  Filled 2019-09-22 (×6): qty 1

## 2019-09-22 MED ORDER — DIPHENHYDRAMINE HCL 12.5 MG/5ML PO ELIX: 12.5000 mg | ORAL_SOLUTION | ORAL | Status: DC | PRN

## 2019-09-22 MED ORDER — FENTANYL CITRATE (PF) 100 MCG/2ML IJ SOLN
INTRAMUSCULAR | Status: DC | PRN
  Administered 2019-09-22: 25 ug via INTRAVENOUS

## 2019-09-22 MED ORDER — PROPOFOL 500 MG/50ML IV EMUL
INTRAVENOUS | Status: DC | PRN
  Administered 2019-09-22: 30 ug/kg/min via INTRAVENOUS

## 2019-09-22 MED ORDER — VORTIOXETINE HBR 5 MG PO TABS
20.0000 mg | ORAL_TABLET | Freq: Every day | ORAL | Status: DC
  Administered 2019-09-23 – 2019-09-27 (×5): 20 mg via ORAL
  Filled 2019-09-22 (×5): qty 4

## 2019-09-22 MED ORDER — FLEET ENEMA 7-19 GM/118ML RE ENEM: 1.0000 | ENEMA | Freq: Once | RECTAL | Status: DC | PRN

## 2019-09-22 MED ORDER — METOCLOPRAMIDE HCL 10 MG PO TABS: 5.0000 mg | ORAL_TABLET | Freq: Three times a day (TID) | ORAL | Status: DC | PRN

## 2019-09-22 MED ORDER — CEFAZOLIN SODIUM-DEXTROSE 2-4 GM/100ML-% IV SOLN
2.0000 g | Freq: Four times a day (QID) | INTRAVENOUS | Status: AC
  Administered 2019-09-22 – 2019-09-23 (×3): 2 g via INTRAVENOUS
  Filled 2019-09-22 (×4): qty 100

## 2019-09-22 MED ORDER — ACETAMINOPHEN 325 MG PO TABS: 325.0000 mg | ORAL_TABLET | ORAL | Status: DC | PRN

## 2019-09-22 MED ORDER — STERILE WATER FOR INJECTION IJ SOLN
INTRAMUSCULAR | Status: AC
  Filled 2019-09-22: qty 10

## 2019-09-22 MED ORDER — HYDROCODONE-ACETAMINOPHEN 5-325 MG PO TABS
1.0000 | ORAL_TABLET | ORAL | Status: DC | PRN
  Administered 2019-09-22 – 2019-09-23 (×3): 1 via ORAL
  Administered 2019-09-24: 2 via ORAL
  Administered 2019-09-25: 1 via ORAL
  Filled 2019-09-22 (×3): qty 1
  Filled 2019-09-22: qty 2
  Filled 2019-09-22: qty 1

## 2019-09-22 MED ORDER — ONDANSETRON HCL 4 MG PO TABS
4.0000 mg | ORAL_TABLET | Freq: Four times a day (QID) | ORAL | Status: DC | PRN
  Administered 2019-09-23: 4 mg via ORAL
  Filled 2019-09-22: qty 1

## 2019-09-22 MED ORDER — FENTANYL CITRATE (PF) 100 MCG/2ML IJ SOLN
INTRAMUSCULAR | Status: AC
  Filled 2019-09-22: qty 2

## 2019-09-22 MED ORDER — DOCUSATE SODIUM 100 MG PO CAPS
100.0000 mg | ORAL_CAPSULE | Freq: Two times a day (BID) | ORAL | Status: DC
  Administered 2019-09-22 – 2019-09-24 (×4): 100 mg via ORAL
  Filled 2019-09-22 (×4): qty 1

## 2019-09-22 MED ORDER — GLYCOPYRROLATE 0.2 MG/ML IJ SOLN
INTRAMUSCULAR | Status: DC | PRN
  Administered 2019-09-22: .2 mg via INTRAVENOUS

## 2019-09-22 MED ORDER — RIVASTIGMINE TARTRATE 3 MG PO CAPS
6.0000 mg | ORAL_CAPSULE | Freq: Two times a day (BID) | ORAL | Status: DC
  Administered 2019-09-22 – 2019-09-27 (×10): 6 mg via ORAL
  Filled 2019-09-22 (×11): qty 2

## 2019-09-22 MED ORDER — TRANEXAMIC ACID 1000 MG/10ML IV SOLN
INTRAVENOUS | Status: AC
  Filled 2019-09-22: qty 10

## 2019-09-22 MED ORDER — ENOXAPARIN SODIUM 40 MG/0.4ML ~~LOC~~ SOLN
40.0000 mg | SUBCUTANEOUS | Status: DC
  Administered 2019-09-23 – 2019-09-27 (×5): 40 mg via SUBCUTANEOUS
  Filled 2019-09-22 (×5): qty 0.4

## 2019-09-22 MED ORDER — PROPOFOL 500 MG/50ML IV EMUL
INTRAVENOUS | Status: AC
  Filled 2019-09-22: qty 50

## 2019-09-22 MED ORDER — OLANZAPINE 5 MG PO TABS
5.0000 mg | ORAL_TABLET | Freq: Every day | ORAL | Status: DC
  Administered 2019-09-22 – 2019-09-26 (×5): 5 mg via ORAL
  Filled 2019-09-22 (×5): qty 1

## 2019-09-22 MED ORDER — BUPIVACAINE-EPINEPHRINE (PF) 0.5% -1:200000 IJ SOLN
INTRAMUSCULAR | Status: DC | PRN
  Administered 2019-09-22: 30 mL

## 2019-09-22 MED ORDER — TRAMADOL HCL 50 MG PO TABS
50.0000 mg | ORAL_TABLET | Freq: Four times a day (QID) | ORAL | Status: DC | PRN
  Administered 2019-09-25 – 2019-09-26 (×2): 50 mg via ORAL
  Filled 2019-09-22 (×2): qty 1

## 2019-09-22 MED ORDER — MIDAZOLAM HCL 2 MG/2ML IJ SOLN
INTRAMUSCULAR | Status: AC
  Filled 2019-09-22: qty 2

## 2019-09-22 MED ORDER — BUPIVACAINE LIPOSOME 1.3 % IJ SUSP
INTRAMUSCULAR | Status: AC
  Filled 2019-09-22: qty 20

## 2019-09-22 MED ORDER — TRANEXAMIC ACID 1000 MG/10ML IV SOLN
INTRAVENOUS | Status: DC | PRN
  Administered 2019-09-22: 1000 mg

## 2019-09-22 SURGICAL SUPPLY — 64 items
APL PRP STRL LF DISP 70% ISPRP (MISCELLANEOUS) ×2
BAG DECANTER FOR FLEXI CONT (MISCELLANEOUS) IMPLANT
BLADE SAGITTAL WIDE XTHICK NO (BLADE) ×3 IMPLANT
BLADE SURG SZ20 CARB STEEL (BLADE) ×3 IMPLANT
BNDG COHESIVE 6X5 TAN STRL LF (GAUZE/BANDAGES/DRESSINGS) ×3 IMPLANT
BOWL CEMENT MIXING ADV NOZZLE (MISCELLANEOUS) IMPLANT
CANISTER SUCT 1200ML W/VALVE (MISCELLANEOUS) ×3 IMPLANT
CANISTER SUCT 3000ML PPV (MISCELLANEOUS) ×6 IMPLANT
CHLORAPREP W/TINT 26 (MISCELLANEOUS) ×6 IMPLANT
COVER WAND RF STERILE (DRAPES) ×3 IMPLANT
DECANTER SPIKE VIAL GLASS SM (MISCELLANEOUS) ×6 IMPLANT
DRAPE 3/4 80X56 (DRAPES) ×3 IMPLANT
DRAPE INCISE IOBAN 66X60 STRL (DRAPES) ×3 IMPLANT
DRAPE SPLIT 6X30 W/TAPE (DRAPES) ×6 IMPLANT
DRAPE SURG 17X11 SM STRL (DRAPES) ×3 IMPLANT
DRAPE SURG 17X23 STRL (DRAPES) ×3 IMPLANT
DRSG OPSITE POSTOP 4X12 (GAUZE/BANDAGES/DRESSINGS) ×3 IMPLANT
DRSG OPSITE POSTOP 4X14 (GAUZE/BANDAGES/DRESSINGS) ×3 IMPLANT
ELECT BLADE 6.5 EXT (BLADE) ×3 IMPLANT
ELECT CAUTERY BLADE 6.4 (BLADE) ×3 IMPLANT
ELECT REM PT RETURN 9FT ADLT (ELECTROSURGICAL) ×3
ELECTRODE REM PT RTRN 9FT ADLT (ELECTROSURGICAL) ×1 IMPLANT
GAUZE PACK 2X3YD (GAUZE/BANDAGES/DRESSINGS) IMPLANT
GLOVE BIO SURGEON STRL SZ8 (GLOVE) ×6 IMPLANT
GLOVE BIOGEL M STRL SZ7.5 (GLOVE) IMPLANT
GLOVE BIOGEL PI IND STRL 8 (GLOVE) IMPLANT
GLOVE BIOGEL PI INDICATOR 8 (GLOVE)
GLOVE INDICATOR 8.0 STRL GRN (GLOVE) ×3 IMPLANT
GOWN STRL REUS W/ TWL LRG LVL3 (GOWN DISPOSABLE) ×1 IMPLANT
GOWN STRL REUS W/ TWL XL LVL3 (GOWN DISPOSABLE) ×1 IMPLANT
GOWN STRL REUS W/TWL LRG LVL3 (GOWN DISPOSABLE) ×3
GOWN STRL REUS W/TWL XL LVL3 (GOWN DISPOSABLE) ×3
HEAD ENDO II MOD SZ 49 (Orthopedic Implant) ×3 IMPLANT
HOOD PEEL AWAY FLYTE STAYCOOL (MISCELLANEOUS) ×6 IMPLANT
INSERT TAPER ENDO II -6 (Orthopedic Implant) ×3 IMPLANT
IV NS 100ML SINGLE PACK (IV SOLUTION) IMPLANT
LABEL OR SOLS (LABEL) ×3 IMPLANT
NDL SAFETY ECLIPSE 18X1.5 (NEEDLE) ×1 IMPLANT
NEEDLE FILTER BLUNT 18X 1/2SAF (NEEDLE) ×2
NEEDLE FILTER BLUNT 18X1 1/2 (NEEDLE) ×1 IMPLANT
NEEDLE HYPO 18GX1.5 SHARP (NEEDLE) ×3
NEEDLE SPNL 20GX3.5 QUINCKE YW (NEEDLE) ×3 IMPLANT
NS IRRIG 1000ML POUR BTL (IV SOLUTION) ×3 IMPLANT
PACK HIP PROSTHESIS (MISCELLANEOUS) ×3 IMPLANT
PULSAVAC PLUS IRRIG FAN TIP (DISPOSABLE) ×3
SOL .9 NS 3000ML IRR  AL (IV SOLUTION) ×4
SOL .9 NS 3000ML IRR AL (IV SOLUTION) ×2
SOL .9 NS 3000ML IRR UROMATIC (IV SOLUTION) ×2 IMPLANT
STAPLER SKIN PROX 35W (STAPLE) ×3 IMPLANT
STEM COLLARLESS RED 12X120X130 (Stem) ×3 IMPLANT
STRAP SAFETY 5IN WIDE (MISCELLANEOUS) ×3 IMPLANT
SUT ETHIBOND 2 V 37 (SUTURE) ×9 IMPLANT
SUT VIC AB 1 CT1 36 (SUTURE) ×6 IMPLANT
SUT VIC AB 2-0 CT1 (SUTURE) ×9 IMPLANT
SUT VIC AB 2-0 CT1 27 (SUTURE) ×9
SUT VIC AB 2-0 CT1 TAPERPNT 27 (SUTURE) ×3 IMPLANT
SUT VICRYL 1-0 27IN ABS (SUTURE) ×6
SUTURE VICRYL 1-0 27IN ABS (SUTURE) ×2 IMPLANT
SYR 10ML LL (SYRINGE) ×3 IMPLANT
SYR 30ML LL (SYRINGE) ×9 IMPLANT
SYR TB 1ML 27GX1/2 LL (SYRINGE) IMPLANT
TAPE TRANSPORE STRL 2 31045 (GAUZE/BANDAGES/DRESSINGS) ×3 IMPLANT
TIP BRUSH PULSAVAC PLUS 24.33 (MISCELLANEOUS) ×3 IMPLANT
TIP FAN IRRIG PULSAVAC PLUS (DISPOSABLE) ×1 IMPLANT

## 2019-09-22 NOTE — Progress Notes (Signed)
pt has not voided since she has arrived to unit; states she has urgency, but unable to void; abdomen soft, nontender; bladder scan 411, Dan Humphreys, NP made aware; In and out cath ordered; attempted x2 with 2 RN's present, unsuccessful. Pt refusing further attempts at this time; will endorse to next shift pending patient compliance

## 2019-09-22 NOTE — Transfer of Care (Signed)
Immediate Anesthesia Transfer of Care Note  Patient: [name]  Procedure(s) Performed: ARTHROPLASTY BIPOLAR HIP (HEMIARTHROPLASTY) RIGHT (Left Hip)  Patient Location: PACU  Anesthesia Type:Spinal  Level of Consciousness: drowsy  Airway & Oxygen Therapy: Patient Spontanous Breathing  Post-op Assessment: Report given to RN  Post vital signs: stable  Last Vitals:  Vitals Value Taken Time  BP    Temp    Pulse    Resp    SpO2      Last Pain:  Vitals:   09/22/19 0809  TempSrc:   PainSc: Asleep      Patients Stated Pain Goal: 4 (09/22/19 0444)  Complications: No apparent anesthesia complications

## 2019-09-22 NOTE — Progress Notes (Signed)
Pt is a legal guardian of [facility] Department of Social services, Attending MD Dr. Nelson Chimes and surgeon Dr. Joice Lofts made aware pt will require consent from Legal guardian for treatment. contact info provided [employer] [phone];

## 2019-09-22 NOTE — Anesthesia Preprocedure Evaluation (Addendum)
Anesthesia Evaluation  Patient identified by MRN, date of birth, ID band Patient awake    Reviewed: Allergy & Precautions, H&P , NPO status , Patient's Chart, lab work & pertinent test results  Airway Mallampati: II  TM Distance: >3 FB Neck ROM: limited    Dental  (+) Upper Dentures, Partial Lower, Chipped, Missing   Pulmonary former smoker,    Pulmonary exam normal        Cardiovascular negative cardio ROS Normal cardiovascular exam     Neuro/Psych PSYCHIATRIC DISORDERS Dementia negative neurological ROS     GI/Hepatic negative GI ROS, Neg liver ROS, neg GERD  ,  Endo/Other  negative endocrine ROS  Renal/GU      Musculoskeletal   Abdominal   Peds  Hematology  (+) Blood dyscrasia, anemia , Hgb 10.9   Anesthesia Other Findings Past Medical History: No date: Alzheimer's dementia (HCC) No date: Insomnia No date: Low back pain No date: Vomiting  Past Surgical History: No date: VAGINAL HYSTERECTOMY  BMI    Body Mass Index: 16.06 kg/m      Reproductive/Obstetrics negative OB ROS                            Anesthesia Physical Anesthesia Plan  ASA: II and emergent  Anesthesia Plan: Spinal   Post-op Pain Management:    Induction:   PONV Risk Score and Plan: 2 and TIVA and Treatment may vary due to age or medical condition  Airway Management Planned:   Additional Equipment:   Intra-op Plan:   Post-operative Plan:   Informed Consent: I have reviewed the patients History and Physical, chart, labs and discussed the procedure including the risks, benefits and alternatives for the proposed anesthesia with the patient or authorized representative who has indicated his/her understanding and acceptance.     Dental Advisory Given  Plan Discussed with: Anesthesiologist  Anesthesia Plan Comments:        Anesthesia Quick Evaluation

## 2019-09-22 NOTE — Progress Notes (Signed)
  Bladder scan performed, < in bladder.

## 2019-09-22 NOTE — Progress Notes (Signed)
15 minute call to floor. 

## 2019-09-22 NOTE — Progress Notes (Signed)
PROGRESS NOTE    [name]  UTM:[id number] DOB: [birth year] DOA: 09/21/2019 PCP: Patient, No Pcp Per   Brief Narrative:  [name] is an 79 y.o. female with medical history significant for low back pain, Alzheimer's dementia, presented today hospital after a mechanical fall at [place] facility.  X-ray shows left femoral neck fracture.  Taken to the OR by orthopedic for Hemi arthroplasty.  Subjective: Patient was feeling better when seen this morning.  She was waiting for her surgery.  Pain was well controlled.  Assessment & Plan:   Active Problems:   Closed left subtrochanteric femur fracture (HCC)  Closed left subtrochanteric femur fracture (HCC) Going for hemiarthroplasty with orthopedic today. -Postoperative management per surgery. -Pain management. -PT/OT consult after surgery.  Alzheimer's dementia and depression. -Continue home meds.  Objective: Vitals:   09/21/19 1930 09/21/19 2000 09/21/19 2039 09/22/19 0745  BP:  102/73 (!) 143/64 104/66  Pulse: 78 76 80 79  Resp: 15 14 16 16   Temp:   98.6 F (37 C) 98.6 F (37 C)  TempSrc:   Oral Oral  SpO2: 93%  92% 93%  Weight:   47.2 kg   Height:   5' 7.5" (1.715 m)     Intake/Output Summary (Last 24 hours) at 09/22/2019 1304 Last data filed at 09/21/2019 1504 Gross per 24 hour  Intake 970 ml  Output --  Net 970 ml   Filed Weights   09/21/19 0740 09/21/19 2039  Weight: 54.4 kg 47.2 kg    Examination:  General exam: Appears calm and comfortable  Respiratory system: Clear to auscultation. Respiratory effort normal. Cardiovascular system: S1 & S2 heard, RRR. No JVD, murmurs, rubs, gallops or clicks. Gastrointestinal system: Soft, nontender, nondistended, bowel sounds positive. Central nervous system: Alert and oriented. No focal neurological deficits.Symmetric 5 x 5 power. Extremities: No edema, no cyanosis, pulses intact and symmetrical. Skin: No rashes, lesions or ulcers Psychiatry: Judgement and  insight appear normal. Mood & affect appropriate.    DVT prophylaxis: Lovenox after surgery Code Status: DNR Family Communication: Patient has a legal guardian, who is giving consent and being updated for her care. Disposition Plan: Pending improvement, most likely back to facility with physical therapy.  Consultants:   Orthopedic  Procedures:  Antimicrobials:   Data Reviewed: I have personally reviewed following labs and imaging studies  CBC: Recent Labs  Lab 09/21/19 0802 09/22/19 0425  WBC 10.2 9.7  NEUTROABS 6.2  --   HGB 13.0 10.9*  HCT 41.0 34.5*  MCV 90.5 90.6  PLT 327 568   Basic Metabolic Panel: Recent Labs  Lab 09/21/19 0802 09/22/19 0425  NA 143 140  K 3.6 3.9  CL 105 108  CO2 27 24  GLUCOSE 103* 91  BUN 22 22  CREATININE 0.78 0.73  CALCIUM 9.6 8.5*   GFR: Estimated Creatinine Clearance: 42.5 mL/min (by C-G formula based on SCr of 0.73 mg/dL). Liver Function Tests: Recent Labs  Lab 09/21/19 0802  AST 22  ALT 20  ALKPHOS 68  BILITOT 0.5  PROT 6.9  ALBUMIN 3.7   No results for input(s): LIPASE, AMYLASE in the last 168 hours. No results for input(s): AMMONIA in the last 168 hours. Coagulation Profile: No results for input(s): INR, PROTIME in the last 168 hours. Cardiac Enzymes: No results for input(s): CKTOTAL, CKMB, CKMBINDEX, TROPONINI in the last 168 hours. BNP (last 3 results) No results for input(s): PROBNP in the last 8760 hours. HbA1C: No results for input(s): HGBA1C in  the last 72 hours. CBG: No results for input(s): GLUCAP in the last 168 hours. Lipid Profile: No results for input(s): CHOL, HDL, LDLCALC, TRIG, CHOLHDL, LDLDIRECT in the last 72 hours. Thyroid Function Tests: No results for input(s): TSH, T4TOTAL, FREET4, T3FREE, THYROIDAB in the last 72 hours. Anemia Panel: No results for input(s): VITAMINB12, FOLATE, FERRITIN, TIBC, IRON, RETICCTPCT in the last 72 hours. Sepsis Labs: No results for input(s): PROCALCITON,  LATICACIDVEN in the last 168 hours.  Recent Results (from the past 240 hour(s))  Respiratory Panel by RT PCR (Flu A&B, Covid) - Nasopharyngeal Swab     Status: None   Collection Time: 09/21/19 12:59 PM   Specimen: Nasopharyngeal Swab  Result Value Ref Range Status   SARS Coronavirus 2 by RT PCR NEGATIVE NEGATIVE Final    Comment: (NOTE) SARS-CoV-2 target nucleic acids are NOT DETECTED. The SARS-CoV-2 RNA is generally detectable in upper respiratoy specimens during the acute phase of infection. The lowest concentration of SARS-CoV-2 viral copies this assay can detect is 131 copies/mL. A negative result does not preclude SARS-Cov-2 infection and should not be used as the sole basis for treatment or other patient management decisions. A negative result may occur with  improper specimen collection/handling, submission of specimen other than nasopharyngeal swab, presence of viral mutation(s) within the areas targeted by this assay, and inadequate number of viral copies (<131 copies/mL). A negative result must be combined with clinical observations, patient history, and epidemiological information. The expected result is Negative. Fact Sheet for Patients:  https://www.moore.com/ Fact Sheet for Healthcare Providers:  https://www.young.biz/ This test is not yet ap proved or cleared by the [place] FDA and  has been authorized for detection and/or diagnosis of SARS-CoV-2 by FDA under an Emergency Use Authorization (EUA). This EUA will remain  in effect (meaning this test can be used) for the duration of the COVID-19 declaration under Section 564(b)(1) of the Act, 21 U.S.C. section 360bbb-3(b)(1), unless the authorization is terminated or revoked sooner.    Influenza A by PCR NEGATIVE NEGATIVE Final   Influenza B by PCR NEGATIVE NEGATIVE Final    Comment: (NOTE) The Xpert Xpress SARS-CoV-2/FLU/RSV assay is intended as an aid in  the diagnosis of  influenza from Nasopharyngeal swab specimens and  should not be used as a sole basis for treatment. Nasal washings and  aspirates are unacceptable for Xpert Xpress SARS-CoV-2/FLU/RSV  testing. Fact Sheet for Patients: https://www.moore.com/ Fact Sheet for Healthcare Providers: https://www.young.biz/ This test is not yet approved or cleared by the [place] FDA and  has been authorized for detection and/or diagnosis of SARS-CoV-2 by  FDA under an Emergency Use Authorization (EUA). This EUA will remain  in effect (meaning this test can be used) for the duration of the  Covid-19 declaration under Section 564(b)(1) of the Act, 21  U.S.C. section 360bbb-3(b)(1), unless the authorization is  terminated or revoked. Performed at [facility], [address] [place], [place] [address]   MRSA PCR Screening     Status: None   Collection Time: 09/21/19  9:12 PM   Specimen: Nasopharyngeal  Result Value Ref Range Status   MRSA by PCR NEGATIVE NEGATIVE Final    Comment:        The GeneXpert MRSA Assay (FDA approved for NASAL specimens only), is one component of a comprehensive MRSA colonization surveillance program. It is not intended to diagnose MRSA infection nor to guide or monitor treatment for MRSA infections. Performed at [facility], [address]  [place], [place] [address]      Radiology Studies: DG Chest 1 View  Result Date: 09/21/2019 CLINICAL DATA:  Fall, pain EXAM: CHEST  1 VIEW COMPARISON:  10/22/2012 FINDINGS: Heart is normal size. There is hyperinflation of the lungs compatible with COPD. Aortic atherosclerosis. No confluent opacities or effusions. No acute bony abnormality. No pneumothorax. IMPRESSION: COPD.  No active disease. Electronically Signed   By: Charlett Nose M.D.   On: 09/21/2019 12:05   DG Pelvis 1-2 Views  Result Date: 09/21/2019 CLINICAL DATA:  Left hip pain after fall EXAM: LEFT FEMUR 2 VIEWS;  PELVIS - 1-2 VIEW COMPARISON:  None. FINDINGS: Acute subcapital fracture of the proximal left femur with mild foreshortening. Alignment of the left hip joint is maintained without dislocation. Pelvic bony ring appears intact. SI joints and pubic symphysis are intact without diastasis. There is chondrocalcinosis at the pubic symphysis. Bones are diffusely demineralized. The more distal aspect of the femur is intact with anatomic alignment at the knee joint. No knee joint effusion. There are extensive vascular calcifications. IMPRESSION: Acute subcapital fracture of the proximal left femur with mild foreshortening. Electronically Signed   By: Duanne Guess D.O.   On: 09/21/2019 12:09   DG Femur Min 2 Views Left  Result Date: 09/21/2019 CLINICAL DATA:  Left hip pain after fall EXAM: LEFT FEMUR 2 VIEWS; PELVIS - 1-2 VIEW COMPARISON:  None. FINDINGS: Acute subcapital fracture of the proximal left femur with mild foreshortening. Alignment of the left hip joint is maintained without dislocation. Pelvic bony ring appears intact. SI joints and pubic symphysis are intact without diastasis. There is chondrocalcinosis at the pubic symphysis. Bones are diffusely demineralized. The more distal aspect of the femur is intact with anatomic alignment at the knee joint. No knee joint effusion. There are extensive vascular calcifications. IMPRESSION: Acute subcapital fracture of the proximal left femur with mild foreshortening. Electronically Signed   By: Duanne Guess D.O.   On: 09/21/2019 12:09    Scheduled Meds: . [MAR Hold] mouth rinse  15 mL Mouth Rinse BID  . pneumococcal 23 valent vaccine  0.5 mL Intramuscular Tomorrow-1000  . [MAR Hold] polyethylene glycol  17 g Oral BID   Continuous Infusions: . [MAR Hold]  ceFAZolin (ANCEF) IV       LOS: 1 day   Time spent: 30 minutes  Arnetha Courser, MD [facility] Pager [phone]  If 7PM-7AM, please contact night-coverage [email or web address] Password  [facility] 09/22/2019, 1:04 PM   This record has been created using Conservation officer, historic buildings. Errors have been sought and corrected,but may not always be located. Such creation errors do not reflect on the standard of care.

## 2019-09-22 NOTE — Op Note (Signed)
09/22/2019  2:39 PM  Patient:   [name]  Pre-Op Diagnosis:   Displaced femoral neck fracture, left hip.  Post-Op Diagnosis:   Same.  Procedure:   Left hip unipolar hemiarthroplasty.  Surgeon:   Pascal Lux, MD  Assistant:   Cameron Proud, PA-C  Anesthesia:   Spinal  Findings:   As above.  Complications:   None  EBL:   100 cc  Fluids:   1000 cc crystalloid  UOP:   None  TT:   None  Drains:   None  Closure:   Staples  Implants:   Biomet press-fit system with a #12 laterally offset reduced proximal profile Echo femoral stem, a 49 mm outer diameter shell, and a -6 mm neck adapter.  Brief Clinical Note:   The patient is a 79 year old female who was in her usual state of health until yesterday morning when she apparently slipped on a hardwood floor in her stocking feet while doing some laundry and fell, landing on her left hip. She was brought to the emergency room where x-rays demonstrated the above-noted injury. The patient has been cleared medically and presents at this time for definitive management of the injury.  Procedure:   The patient was brought into the operating room. After adequate spinal anesthesia was obtained, the patient was repositioned in the right lateral decubitus position and secured using a lateral hip positioner. The left hip and lower extremity were prepped with ChloroPrep solution before being draped sterilely. Preoperative antibiotics were administered. A timeout was performed to verify the appropriate surgical site before a standard posterior approach to the hip was made through an approximately 4-5 inch incision. The incision was carried down through the subcutaneous tissues to expose the gluteal fascia and proximal end of the iliotibial band. These structures were split the length of the incision and the Charnley self-retaining hip retractor placed. The bursal tissues were swept posteriorly to expose the short external rotators. The anterior  border of the piriformis tendon was identified and this plane developed down through the capsule to enter the joint. Abundant fracture hematoma was suctioned. A flap of tissue was elevated off the posterior aspect of the femoral neck and greater trochanter and retracted posteriorly. This flap included the piriformis tendon, the short external rotators, and the posterior capsule. The femoral head was removed in its entirety, then taken to the back table where it was measured and found to be optimally replicated by a 49 mm head. The appropriate trial head was inserted and found to demonstrate an excellent suction fit.   Attention was directed to the femoral side. The femoral neck was recut 10-12 mm above the lesser trochanter using an oscillating saw. The piriformis fossa was debrided of soft tissues before the intramedullary canal was accessed through this point using a triple step reamer. The canal was reamed sequentially beginning with a #7 tapered reamer and progressing to a #12 tapered reamer. This provided excellent circumferential chatter. A box osteotome was used to establish version before the canal was broached sequentially beginning with a #9 broach and progressing to a #12 broach. This was left in place and several trial reductions performed. The permanent #12 reduced proximal profile femoral stem was impacted into place. A repeat trial reduction was performed using the -6 mm neck length. The -6 mm neck length demonstrated excellent stability both in extension and external rotation as well as with flexion to 90 and internal rotation beyond 70. It also was stable in the position  of sleep. The 49 mm outer diameter shell with the -6 mm neck adapter construct was put together on the back table before being impacted onto the stem of the femoral component. The Morse taper locking mechanism was verified using manual distraction before the head was relocated and the hip placed through a range of motion with  the findings as described above.  The wound was copiously irrigated with bacitracin saline solution via the jet lavage system before the peri-incisional and pericapsular tissues were injected with 30 cc of 0.5% Sensorcaine with epinephrine and 20 cc of Exparel diluted out to 60 cc with normal saline to help with postoperative analgesia. The posterior flap was reapproximated to the posterior aspect of the greater trochanter using #2 Tycron interrupted sutures placed through drill holes. Several additional #2 Tycron interrupted sutures were used to reinforce this layer of closure. The iliotibial band was reapproximated using #1 Vicryl interrupted sutures before the gluteal fascia was closed using a running #1 Vicryl suture. At this point, 1 g of transexemic acid in 10 cc of normal saline was injected into the joint to help reduce postoperative bleeding. The subcutaneous tissues were closed in several layers using 2-0 Vicryl interrupted sutures before the skin was closed using staples. A sterile occlusive dressing was applied to the wound. The patient was then rolled back into the supine position on the hospital bed before being awakened and returned to the recovery room in satisfactory condition after tolerating the procedure well.

## 2019-09-23 LAB — URINALYSIS, COMPLETE (UACMP) WITH MICROSCOPIC
Bacteria, UA: NONE SEEN
Bilirubin Urine: NEGATIVE
Glucose, UA: NEGATIVE mg/dL
Hgb urine dipstick: NEGATIVE
Ketones, ur: 5 mg/dL — AB
Nitrite: NEGATIVE
Protein, ur: NEGATIVE mg/dL
Specific Gravity, Urine: 1.027 (ref 1.005–1.030)
WBC, UA: >50 WBC/hpf — ABNORMAL HIGH (ref 0–5)
pH: 5 (ref 5.0–8.0)

## 2019-09-23 LAB — BASIC METABOLIC PANEL
Anion gap: 11 (ref 5–15)
BUN: 19 mg/dL (ref 8–23)
CO2: 22 mmol/L (ref 22–32)
Calcium: 8.1 mg/dL — ABNORMAL LOW (ref 8.9–10.3)
Chloride: 104 mmol/L (ref 98–111)
Creatinine, Ser: 0.75 mg/dL (ref 0.44–1.00)
GFR calc Af Amer: >60 mL/min (ref >60)
GFR calc non Af Amer: >60 mL/min (ref >60)
Glucose, Bld: 96 mg/dL (ref 70–99)
Potassium: 3.6 mmol/L (ref 3.5–5.1)
Sodium: 137 mmol/L (ref 135–145)

## 2019-09-23 LAB — CBC WITH DIFFERENTIAL/PLATELET
Abs Immature Granulocytes: 0.05 10*3/uL (ref 0.00–0.07)
Basophils Absolute: 0 10*3/uL (ref 0.0–0.1)
Basophils Relative: 0 %
Eosinophils Absolute: 0.6 10*3/uL — ABNORMAL HIGH (ref 0.0–0.5)
Eosinophils Relative: 5 %
HCT: 33.5 % — ABNORMAL LOW (ref 36.0–46.0)
Hemoglobin: 10.6 g/dL — ABNORMAL LOW (ref 12.0–15.0)
Immature Granulocytes: 0 %
Lymphocytes Relative: 19 %
Lymphs Abs: 2.2 10*3/uL (ref 0.7–4.0)
MCH: 29 pg (ref 26.0–34.0)
MCHC: 31.6 g/dL (ref 30.0–36.0)
MCV: 91.8 fL (ref 80.0–100.0)
Monocytes Absolute: 1.3 10*3/uL — ABNORMAL HIGH (ref 0.1–1.0)
Monocytes Relative: 11 %
Neutro Abs: 7.5 10*3/uL (ref 1.7–7.7)
Neutrophils Relative %: 65 %
Platelets: 204 10*3/uL (ref 150–400)
RBC: 3.65 MIL/uL — ABNORMAL LOW (ref 3.87–5.11)
RDW: 13.6 % (ref 11.5–15.5)
WBC: 11.5 10*3/uL — ABNORMAL HIGH (ref 4.0–10.5)
nRBC: 0 % (ref 0.0–0.2)

## 2019-09-23 MED ORDER — MAGNESIUM HYDROXIDE 400 MG/5ML PO SUSP
30.0000 mL | Freq: Every day | ORAL | Status: DC
  Administered 2019-09-23 – 2019-09-24 (×2): 30 mL via ORAL
  Filled 2019-09-23 (×2): qty 30

## 2019-09-23 MED ORDER — BOOST / RESOURCE BREEZE PO LIQD CUSTOM
1.0000 | Freq: Three times a day (TID) | ORAL | Status: DC
  Administered 2019-09-23 – 2019-09-25 (×9): 1 via ORAL

## 2019-09-23 MED ORDER — CEPHALEXIN 500 MG PO CAPS
500.0000 mg | ORAL_CAPSULE | Freq: Two times a day (BID) | ORAL | Status: DC
  Administered 2019-09-23 – 2019-09-24 (×3): 500 mg via ORAL
  Filled 2019-09-23 (×3): qty 1

## 2019-09-23 MED ORDER — PRO-STAT SUGAR FREE PO LIQD
30.0000 mL | Freq: Two times a day (BID) | ORAL | Status: DC
  Administered 2019-09-23 – 2019-09-25 (×5): 30 mL via ORAL

## 2019-09-23 NOTE — Progress Notes (Signed)
   Subjective: 1 Day Post-Op Procedure(s) (LRB): ARTHROPLASTY BIPOLAR HIP (HEMIARTHROPLASTY) RIGHT (Left) Patient reports pain as moderate.  Patient states she feels sore all over. Patient is well, and has had no acute complaints or problems Denies any CP, SOB, ABD pain. We will continue therapy today.  Plan is to go Skilled nursing facility after hospital stay.  Objective: Vital signs in last 24 hours: Temp:  [97.3 F (36.3 C)-98.6 F (37 C)] 98.2 F (36.8 C) (01/10 0755) Pulse Rate:  [70-97] 84 (01/10 0755) Resp:  [11-18] 16 (01/10 0332) BP: (90-129)/(51-94) 105/57 (01/10 0755) SpO2:  [91 %-100 %] 95 % (01/10 0755)  Intake/Output from previous day: 01/09 0701 - 01/10 0700 In: 2793.5 [P.O.:720; I.V.:1773.5; IV Piggyback:300] Out: 620 [Urine:520; Blood:100] Intake/Output this shift: No intake/output data recorded.  Recent Labs    09/21/19 0802 09/22/19 0425 09/23/19 0604  HGB 13.0 10.9* 10.6*   Recent Labs    09/22/19 0425 09/23/19 0604  WBC 9.7 11.5*  RBC 3.81* 3.65*  HCT 34.5* 33.5*  PLT 238 204   Recent Labs    09/22/19 0425 09/23/19 0604  NA 140 137  K 3.9 3.6  CL 108 104  CO2 24 22  BUN 22 19  CREATININE 0.73 0.75  GLUCOSE 91 96  CALCIUM 8.5* 8.1*   No results for input(s): LABPT, INR in the last 72 hours.  EXAM General - Patient is Alert, Appropriate and Oriented Extremity - Neurovascular intact Sensation intact distally Intact pulses distally Dorsiflexion/Plantar flexion intact No cellulitis present Compartment soft Dressing - dressing C/D/I and no drainage Motor Function - intact, moving foot and toes well on exam.   Past Medical History:  Diagnosis Date  . Alzheimer's dementia (HCC)   . Insomnia   . Low back pain   . Vomiting     Assessment/Plan:   1 Day Post-Op Procedure(s) (LRB): ARTHROPLASTY BIPOLAR HIP (HEMIARTHROPLASTY) RIGHT (Left) Active Problems:   Closed left subtrochanteric femur fracture (HCC)   Fall  Estimated  body mass index is 16.06 kg/m as calculated from the following:   Height as of this encounter: 5' 7.5" (1.715 m).   Weight as of this encounter: 47.2 kg. Advance diet Up with therapy, weightbearing as tolerated left lower extremity Needs bowel movement Labs and vital signs are stable. Pain seems to be well controlled, will continue to monitor   Follow-up with [facility] orthopedics in 2 weeks for staple removal and Steri-Strip application Lovenox 40 mg subcu daily x14 days after discharge TED hose bilateral lower extremities x6 weeks   DVT Prophylaxis - Lovenox, TED hose and SCDs Weight-Bearing as tolerated to left leg   T. Cranston Neighbor, PA-C [facility] 09/23/2019, 9:36 AM

## 2019-09-23 NOTE — Progress Notes (Signed)
In and out cath was preformed on pt 400ccs of clear amber colored urine was collected.  Pt tolerated without issue.

## 2019-09-23 NOTE — Progress Notes (Signed)
I&O cathed per order, obtained 550 ml concentrated tea colored urine. UA specimen sent to lab.

## 2019-09-23 NOTE — Evaluation (Signed)
Physical Therapy Evaluation Patient Details Name: [name] MRN: [id number] DOB: [birth year] Today's Date: 09/23/2019   History of Present Illness  Patient is a 79 year old female admitted after fall. She is now s/p left hemiarthroplasty. PMH incliudes; alzheimers.  Clinical Impression  Patient received in bed, reports she is not doing so well. Reports pain in left leg. She is agreeable to PT evaluation. Patient requires total assist with all mobility at this time. She does not initiate any mobility with direction states she can't. Patient required total assist with spt to chair. She will continue to benefit from skilled PT to improve functional independence and strength.       Follow Up Recommendations SNF    Equipment Recommendations  None recommended by PT    Recommendations for Other Services       Precautions / Restrictions Precautions Precautions: Fall Restrictions Weight Bearing Restrictions: No LLE Weight Bearing: Weight bearing as tolerated      Mobility  Bed Mobility Overal bed mobility: Needs Assistance Bed Mobility: Supine to Sit     Supine to sit: Max assist     General bed mobility comments: decreased initiation of movement. States " I can't" to each direction.  Transfers Overall transfer level: Needs assistance Equipment used: None Transfers: Stand Pivot Transfers Sit to Stand: Total assist Stand pivot transfers: Total assist       General transfer comment: placed rw infront of patient and she does not attempt to stand states " i can't" therefore removed rw and assisted patient to recliner via SPT with total assist.  Ambulation/Gait             General Gait Details: unable at this time  Stairs            Wheelchair Mobility    Modified Rankin (Stroke Patients Only)       Balance Overall balance assessment: Needs assistance Sitting-balance support: Feet supported Sitting balance-Leahy Scale: Fair     Standing balance  support: During functional activity Standing balance-Leahy Scale: Fair Standing balance comment: requires total assist                             Pertinent Vitals/Pain Pain Assessment: Faces Faces Pain Scale: Hurts little more Pain Location: Left leg Pain Descriptors / Indicators: Grimacing;Discomfort;Operative site guarding Pain Intervention(s): Monitored during session;Limited activity within patient's tolerance;Repositioned    Home Living                   Additional Comments: patient unable to provide any home environment information    Prior Function Level of Independence: Independent with assistive device(s)         Comments: states she used RW prior to admission     Hand Dominance        Extremity/Trunk Assessment   Upper Extremity Assessment Upper Extremity Assessment: Generalized weakness    Lower Extremity Assessment Lower Extremity Assessment: Generalized weakness    Cervical / Trunk Assessment Cervical / Trunk Assessment: Normal  Communication   Communication: No difficulties  Cognition Arousal/Alertness: Awake/alert Behavior During Therapy: WFL for tasks assessed/performed Overall Cognitive Status: No family/caregiver present to determine baseline cognitive functioning                                 General Comments: patient knows she is in hospital. Tells me her daughter lives in [place].  States she lives alone.      General Comments      Exercises     Assessment/Plan    PT Assessment Patient needs continued PT services  PT Problem List Decreased strength;Decreased mobility;Decreased activity tolerance;Decreased balance;Pain;Decreased cognition;Decreased safety awareness       PT Treatment Interventions Therapeutic exercise;Gait training;Balance training;Functional mobility training;Therapeutic activities;Patient/family education    PT Goals (Current goals can be found in the Care Plan section)   Acute Rehab PT Goals Patient Stated Goal: none stated PT Goal Formulation: Patient unable to participate in goal setting Time For Goal Achievement: 10/07/19 Potential to Achieve Goals: Fair    Frequency BID   Barriers to discharge Decreased caregiver support      Co-evaluation               AM-PAC PT "6 Clicks" Mobility  Outcome Measure Help needed turning from your back to your side while in a flat bed without using bedrails?: Total Help needed moving from lying on your back to sitting on the side of a flat bed without using bedrails?: Total Help needed moving to and from a bed to a chair (including a wheelchair)?: Total Help needed standing up from a chair using your arms (e.g., wheelchair or bedside chair)?: Total Help needed to walk in hospital room?: Total Help needed climbing 3-5 steps with a railing? : Total 6 Click Score: 6    End of Session Equipment Utilized During Treatment: Gait belt;Oxygen Activity Tolerance: Patient tolerated treatment well;Patient limited by pain Patient left: in chair;with call bell/phone within reach;with bed alarm set Nurse Communication: Mobility status PT Visit Diagnosis: Muscle weakness (generalized) (M62.81);History of falling (Z91.81);Other abnormalities of gait and mobility (R26.89);Difficulty in walking, not elsewhere classified (R26.2);Pain;Unsteadiness on feet (R26.81) Pain - Right/Left: Left    Time: 1610-9604 PT Time Calculation (min) (ACUTE ONLY): 17 min   Charges:   PT Evaluation $PT Eval Moderate Complexity: 1 Mod PT Treatments $Therapeutic Activity: 8-22 mins        Alpha Chouinard, PT, GCS 09/23/19,10:57 AM

## 2019-09-23 NOTE — Progress Notes (Signed)
Initial Nutrition Assessment  RD working remotely.  DOCUMENTATION CODES:   Underweight, suspect malnutrition but unable to confirm at this time  INTERVENTION:   - Boost Breeze po TID, each supplement provides 250 kcal and 9 grams of protein  - Pro-stat 30 ml po BID, each supplement provides 100 kcal and 15 grams of protein  - Once diet advanced, will order Ensure Enlive po BID, each supplement provides 350 kcal and 20 grams of protein  NUTRITION DIAGNOSIS:   Increased nutrient needs related to hip fracture, post-op healing as evidenced by estimated needs.  GOAL:   Patient will meet greater than or equal to 90% of their needs  MONITOR:   PO intake, Supplement acceptance, Diet advancement, Labs, Weight trends, Skin  REASON FOR ASSESSMENT:   Malnutrition Screening Tool    ASSESSMENT:   79 year old female who presented to the ED on 1/08 after a fall. PMH of Alzheimer's dementia. Pt found to have left hip fracture.   01/09 - s/p left hip unipolar hemiarthroplasty  Pt is currently on a clear liquid diet after surgery. Meal completion 100% of dinner tray last night.  Spoke with pt via phone call to room. History was limited due to dementia. Pt reports that she is doing poorly this morning and is in a lot of pain. Pt states that she has not had anything to eat or drink yet today.  Pt reports that she typically has a good appetite and eats 3 meals daily. Noted pt presenting from ALF.  Pt does not know if she has lost weight but reports her UBW as 125 lbs. Current weight is 104 lbs. Unsure of timeframe of weight loss.  Strongly suspect pt with malnutrition but unable to confirm at this time.  RD will order clear liquid oral nutrition supplements to aid pt in meeting kcal and protein needs during admission. Once diet advanced, will order Ensure Enlive.  Medications reviewed and include: colace, lactulose, Remeron 30 mg daily, protonix, Miralax  Labs reviewed.  UOP: 520 ml  x 24 hours I/O's: +3.1 L since admit  NUTRITION - FOCUSED PHYSICAL EXAM:  Unable to complete at this time. RD working remotely.  Diet Order:   Diet Order            Diet clear liquid Room service appropriate? Yes; Fluid consistency: Thin  Diet effective now              EDUCATION NEEDS:   No education needs have been identified at this time  Skin:  Skin Assessment: Skin Integrity Issues: Skin Integrity Issues: Incisions: closed incision to left hip  Last BM:  09/18/19  Height:   Ht Readings from Last 1 Encounters:  09/21/19 5' 7.5" (1.715 m)    Weight:   Wt Readings from Last 1 Encounters:  09/21/19 47.2 kg    Ideal Body Weight:  62.5 kg  BMI:  Body mass index is 16.06 kg/m.  Estimated Nutritional Needs:   Kcal:  1300-1500  Protein:  65-80 grams  Fluid:  1.3-1.5 L    Earma Reading, MS, RD, LDN Inpatient Clinical Dietitian Pager: [phone] Weekend/After Hours: [phone]

## 2019-09-23 NOTE — Progress Notes (Addendum)
Denies need to void. Has purewick on without urine output. Bladder scan 800 ml. Hospitalist notified

## 2019-09-23 NOTE — Progress Notes (Signed)
PROGRESS NOTE    ROSHAWNA COLCLASURE  OZH:[id number] DOB: 07/08/1941 DOA: 09/21/2019 PCP: Patient, No Pcp Per   Brief Narrative:  BERNETA SCONYERS is an 79 y.o. female with medical history significant for low back pain, Alzheimer's dementia, presented today hospital after a mechanical fall at [facility] facility.  X-ray shows left femoral neck fracture.  Taken to the OR by orthopedic for Hemi arthroplasty.  Subjective: Patient was sitting in chair when seen this morning.  She was having some pain at surgical site.  No bowel movements.  She denies any urinary symptoms.  Assessment & Plan:   Active Problems:   Closed left subtrochanteric femur fracture (HCC)   Fall  Closed left subtrochanteric femur fracture (HCC)S/P hemiarthroplasty. -Postoperative management per surgery. -Pain management. -PT/OT consult after surgery. -Lovenox 40 mg subcu daily x14 days after discharge -TED hose bilateral lower extremities x6 weeks  Bacteriuria.  Patient denies any urinary symptoms but she also has dementia. Mild leukocytosis, remained afebrile. Urine cultures were ordered. -I will start her on Keflex based on prior culture results as UTI can be manifested as fall in elderly especially with dementia.  Alzheimer's dementia and depression. -Continue home meds.  Objective: Vitals:   09/23/19 0332 09/23/19 0755 09/23/19 1044 09/23/19 1157  BP: (!) 95/56 (!) 105/57  (!) 90/55  Pulse: 70 84  80  Resp: 16     Temp: 98.2 F (36.8 C) 98.2 F (36.8 C)  (!) 97.5 F (36.4 C)  TempSrc: Oral Oral  Oral  SpO2: 94% 95% (!) 86% 93%  Weight:      Height:        Intake/Output Summary (Last 24 hours) at 09/23/2019 1310 Last data filed at 09/23/2019 1130 Gross per 24 hour  Intake 3126.48 ml  Output 620 ml  Net 2506.48 ml   Filed Weights   09/21/19 0740 09/21/19 2039  Weight: 54.4 kg 47.2 kg    Examination:  General exam: Chronically ill-appearing elderly lady, appears calm and comfortable    Respiratory system: Clear to auscultation. Respiratory effort normal. Cardiovascular system: S1 & S2 heard, RRR. No JVD, murmurs, rubs, gallops or clicks. Gastrointestinal system: Soft, nontender, nondistended, bowel sounds positive. Central nervous system: Alert and oriented. No focal neurological deficits.Symmetric 5 x 5 power. Extremities: No edema, no cyanosis, pulses intact and symmetrical. Skin: No rashes, lesions or ulcers Psychiatry: Judgement and insight appear normal. Mood & affect appropriate.    DVT prophylaxis: Lovenox  Code Status: DNR Family Communication: Patient has a legal guardian, who is giving consent and being updated for her care. Disposition Plan: Pending improvement, most likely back to facility with physical therapy.  Consultants:   Orthopedic  Procedures:  Antimicrobials:  Keflex  Data Reviewed: I have personally reviewed following labs and imaging studies  CBC: Recent Labs  Lab 09/21/19 0802 09/22/19 0425 09/23/19 0604  WBC 10.2 9.7 11.5*  NEUTROABS 6.2  --  7.5  HGB 13.0 10.9* 10.6*  HCT 41.0 34.5* 33.5*  MCV 90.5 90.6 91.8  PLT 327 238 629   Basic Metabolic Panel: Recent Labs  Lab 09/21/19 0802 09/22/19 0425 09/23/19 0604  NA 143 140 137  K 3.6 3.9 3.6  CL 105 108 104  CO2 27 24 22   GLUCOSE 103* 91 96  BUN 22 22 19   CREATININE 0.78 0.73 0.75  CALCIUM 9.6 8.5* 8.1*   GFR: Estimated Creatinine Clearance: 42.5 mL/min (by C-G formula based on SCr of 0.75 mg/dL). Liver Function Tests: Recent Labs  Lab 09/21/19 0802  AST 22  ALT 20  ALKPHOS 68  BILITOT 0.5  PROT 6.9  ALBUMIN 3.7   No results for input(s): LIPASE, AMYLASE in the last 168 hours. No results for input(s): AMMONIA in the last 168 hours. Coagulation Profile: No results for input(s): INR, PROTIME in the last 168 hours. Cardiac Enzymes: No results for input(s): CKTOTAL, CKMB, CKMBINDEX, TROPONINI in the last 168 hours. BNP (last 3 results) No results for  input(s): PROBNP in the last 8760 hours. HbA1C: No results for input(s): HGBA1C in the last 72 hours. CBG: No results for input(s): GLUCAP in the last 168 hours. Lipid Profile: No results for input(s): CHOL, HDL, LDLCALC, TRIG, CHOLHDL, LDLDIRECT in the last 72 hours. Thyroid Function Tests: No results for input(s): TSH, T4TOTAL, FREET4, T3FREE, THYROIDAB in the last 72 hours. Anemia Panel: No results for input(s): VITAMINB12, FOLATE, FERRITIN, TIBC, IRON, RETICCTPCT in the last 72 hours. Sepsis Labs: No results for input(s): PROCALCITON, LATICACIDVEN in the last 168 hours.  Recent Results (from the past 240 hour(s))  Respiratory Panel by RT PCR (Flu A&B, Covid) - Nasopharyngeal Swab     Status: None   Collection Time: 09/21/19 12:59 PM   Specimen: Nasopharyngeal Swab  Result Value Ref Range Status   SARS Coronavirus 2 by RT PCR NEGATIVE NEGATIVE Final    Comment: (NOTE) SARS-CoV-2 target nucleic acids are NOT DETECTED. The SARS-CoV-2 RNA is generally detectable in upper respiratoy specimens during the acute phase of infection. The lowest concentration of SARS-CoV-2 viral copies this assay can detect is 131 copies/mL. A negative result does not preclude SARS-Cov-2 infection and should not be used as the sole basis for treatment or other patient management decisions. A negative result may occur with  improper specimen collection/handling, submission of specimen other than nasopharyngeal swab, presence of viral mutation(s) within the areas targeted by this assay, and inadequate number of viral copies (<131 copies/mL). A negative result must be combined with clinical observations, patient history, and epidemiological information. The expected result is Negative. Fact Sheet for Patients:  https://www.moore.com/ Fact Sheet for Healthcare Providers:  https://www.young.biz/ This test is not yet ap proved or cleared by the [place] FDA and    has been authorized for detection and/or diagnosis of SARS-CoV-2 by FDA under an Emergency Use Authorization (EUA). This EUA will remain  in effect (meaning this test can be used) for the duration of the COVID-19 declaration under Section 564(b)(1) of the Act, 21 U.S.C. section 360bbb-3(b)(1), unless the authorization is terminated or revoked sooner.    Influenza A by PCR NEGATIVE NEGATIVE Final   Influenza B by PCR NEGATIVE NEGATIVE Final    Comment: (NOTE) The Xpert Xpress SARS-CoV-2/FLU/RSV assay is intended as an aid in  the diagnosis of influenza from Nasopharyngeal swab specimens and  should not be used as a sole basis for treatment. Nasal washings and  aspirates are unacceptable for Xpert Xpress SARS-CoV-2/FLU/RSV  testing. Fact Sheet for Patients: https://www.moore.com/ Fact Sheet for Healthcare Providers: https://www.young.biz/ This test is not yet approved or cleared by the [place] FDA and  has been authorized for detection and/or diagnosis of SARS-CoV-2 by  FDA under an Emergency Use Authorization (EUA). This EUA will remain  in effect (meaning this test can be used) for the duration of the  Covid-19 declaration under Section 564(b)(1) of the Act, 21  U.S.C. section 360bbb-3(b)(1), unless the authorization is  terminated or revoked. Performed at [facility], [address], [place], [place] [address]  MRSA PCR Screening     Status: None   Collection Time: 09/21/19  9:12 PM   Specimen: Nasopharyngeal  Result Value Ref Range Status   MRSA by PCR NEGATIVE NEGATIVE Final    Comment:        The GeneXpert MRSA Assay (FDA approved for NASAL specimens only), is one component of a comprehensive MRSA colonization surveillance program. It is not intended to diagnose MRSA infection nor to guide or monitor treatment for MRSA infections. Performed at [facility], [address], [place], [place] [address]       Radiology Studies: DG HIP UNILAT WITH PELVIS 2-3 VIEWS LEFT  Result Date: 09/22/2019 CLINICAL DATA:  Postoperative after left hip hemiarthroplasty. EXAM: DG HIP (WITH OR WITHOUT PELVIS) 2-3V LEFT COMPARISON:  Femur and pelvis radiographs dated 09/21/2019 FINDINGS: The patient is status post a left hip arthroplasty. The hardware is intact and well aligned. There is no joint dislocation. IMPRESSION: Status post left hip arthroplasty without evidence for hardware complication. Electronically Signed   By: Romona Curls M.D.   On: 09/22/2019 15:09    Scheduled Meds: . acetaminophen  500 mg Oral Q6H  . aspirin  81 mg Oral Daily  . divalproex  125 mg Oral BID  . docusate sodium  100 mg Oral BID  . enoxaparin (LOVENOX) injection  40 mg Subcutaneous Q24H  . feeding supplement  1 Container Oral TID BM  . feeding supplement (PRO-STAT SUGAR FREE 64)  30 mL Oral BID  . lactulose  30 g Oral Daily  . magnesium hydroxide  30 mL Oral Daily  . mouth rinse  15 mL Mouth Rinse BID  . mirtazapine  30 mg Oral Daily  . OLANZapine  5 mg Oral QHS  . pantoprazole  40 mg Oral Daily  . pneumococcal 23 valent vaccine  0.5 mL Intramuscular Tomorrow-1000  . polyethylene glycol  17 g Oral BID  . rivastigmine  6 mg Oral BID  . vortioxetine HBr  20 mg Oral Daily   Continuous Infusions:    LOS: 2 days   Time spent: 30 minutes  Arnetha Courser, MD [facility] Pager [phone]  If 7PM-7AM, please contact night-coverage [email or web address] Password [facility] 09/23/2019, 1:10 PM   This record has been created using Conservation officer, historic buildings. Errors have been sought and corrected,but may not always be located. Such creation errors do not reflect on the standard of care.

## 2019-09-23 NOTE — Progress Notes (Signed)
Stood beside of bed with walker, with 2 stand by assists. Held her own weight and took several steps toward head of bed and then back to bed. Stating "I can't do this, I can't do this". She did a great job!

## 2019-09-23 NOTE — Progress Notes (Signed)
Pt with roughly of urine when bladder scanned.  Obtained order to in and out cath.

## 2019-09-23 NOTE — Anesthesia Postprocedure Evaluation (Signed)
Anesthesia Post Note  Patient: [name]  Procedure(s) Performed: ARTHROPLASTY BIPOLAR HIP (HEMIARTHROPLASTY) RIGHT (Left Hip)  Patient location during evaluation: PACU Anesthesia Type: Spinal Level of consciousness: oriented and awake and alert Pain management: pain level controlled Vital Signs Assessment: post-procedure vital signs reviewed and stable Respiratory status: spontaneous breathing, respiratory function stable, patient connected to nasal cannula oxygen and nonlabored ventilation Cardiovascular status: blood pressure returned to baseline and stable Postop Assessment: no headache, no backache and no apparent nausea or vomiting Anesthetic complications: no     Last Vitals:  Vitals:   09/22/19 2344 09/23/19 0332  BP: (!) 91/51 (!) 95/56  Pulse: 86 70  Resp: 16 16  Temp: (!) 36.3 C 36.8 C  SpO2: 92% 94%    Last Pain:  Vitals:   09/23/19 0332  TempSrc: Oral  PainSc:                  Christia Reading

## 2019-09-24 DIAGNOSIS — F0280 Dementia in other diseases classified elsewhere without behavioral disturbance: Secondary | ICD-10-CM

## 2019-09-24 DIAGNOSIS — G300 Alzheimer's disease with early onset: Secondary | ICD-10-CM

## 2019-09-24 DIAGNOSIS — W19XXXS Unspecified fall, sequela: Secondary | ICD-10-CM

## 2019-09-24 LAB — BASIC METABOLIC PANEL
Anion gap: 10 (ref 5–15)
BUN: 19 mg/dL (ref 8–23)
CO2: 23 mmol/L (ref 22–32)
Calcium: 8.2 mg/dL — ABNORMAL LOW (ref 8.9–10.3)
Chloride: 101 mmol/L (ref 98–111)
Creatinine, Ser: 0.65 mg/dL (ref 0.44–1.00)
GFR calc Af Amer: >60 mL/min (ref >60)
GFR calc non Af Amer: >60 mL/min (ref >60)
Glucose, Bld: 94 mg/dL (ref 70–99)
Potassium: 3.9 mmol/L (ref 3.5–5.1)
Sodium: 134 mmol/L — ABNORMAL LOW (ref 135–145)

## 2019-09-24 LAB — URINE CULTURE: Culture: NO GROWTH

## 2019-09-24 MED ORDER — ENOXAPARIN SODIUM 40 MG/0.4ML ~~LOC~~ SOLN: 40.0000 mg | SUBCUTANEOUS | 0 refills | Status: DC

## 2019-09-24 MED ORDER — SODIUM CHLORIDE 0.9 % IV SOLN: INTRAVENOUS | Status: DC

## 2019-09-24 MED ORDER — CEPHALEXIN 500 MG PO CAPS
500.0000 mg | ORAL_CAPSULE | Freq: Three times a day (TID) | ORAL | Status: DC
  Administered 2019-09-24 – 2019-09-25 (×4): 500 mg via ORAL
  Filled 2019-09-24 (×4): qty 1

## 2019-09-24 MED ORDER — HYDROCODONE-ACETAMINOPHEN 5-325 MG PO TABS: 1.0000 | ORAL_TABLET | ORAL | 0 refills | Status: DC | PRN

## 2019-09-24 NOTE — Progress Notes (Signed)
PROGRESS NOTE    [name]  JQZ:[id number] DOB: 16-Dec-1940 DOA: 09/21/2019 PCP: No primary care provider on file.   Brief Narrative:  [name] is an 79 y.o. female with medical history significant for low back pain, Alzheimer's dementia, presented today hospital after a mechanical fall at [facility].  X-ray shows left femoral neck fracture. s/p Hemi arthroplasty.  Subjective: Had good BMs, no new c/o  Assessment & Plan:   Active Problems:   Closed left subtrochanteric femur fracture (HCC)   Fall  Closed left subtrochanteric femur fracture (HCC)S/P hemiarthroplasty. -Postoperative day 2 -Pain management. -PT/OT recommends SNF -Lovenox 40 mg subcu daily x14 days after discharge -TED hose bilateral lower extremities x6 weeks  Bacteriuria.  Patient denies any urinary symptoms but she also has dementia. Mild leukocytosis, remained afebrile. Urine cultures - no growth, stop abx at D/C - continue Keflex based on prior culture results as UTI can be manifested as fall in elderly especially with dementia.  Alzheimer's dementia and depression. -Continue home meds.  Objective: Vitals:   09/24/19 0742 09/24/19 0742 09/24/19 0915 09/24/19 1526  BP: (!) 102/48 (!) 102/48  (!) 130/55  Pulse:  83  82  Resp:      Temp:   98.9 F (37.2 C) 98.3 F (36.8 C)  TempSrc:   Oral Oral  SpO2:    100%  Weight:      Height:        Intake/Output Summary (Last 24 hours) at 09/24/2019 2033 Last data filed at 09/24/2019 1700 Gross per 24 hour  Intake 1353.66 ml  Output 250 ml  Net 1103.66 ml   Filed Weights   09/21/19 0740 09/21/19 2039  Weight: 54.4 kg 47.2 kg    Examination:  General exam: Chronically ill-appearing elderly lady, appears calm and comfortable  Respiratory system: Clear to auscultation. Respiratory effort normal. Cardiovascular system: S1 & S2 heard, RRR. No JVD, murmurs, rubs, gallops or clicks. Gastrointestinal system: Soft, nontender,  nondistended, bowel sounds positive. Central nervous system: Alert and oriented. No focal neurological deficits.Symmetric 5 x 5 power. Extremities: No edema, no cyanosis, pulses intact and symmetrical. Skin: No rashes, lesions or ulcers Psychiatry: Judgement and insight appear normal. Mood & affect appropriate.    DVT prophylaxis: Lovenox  Code Status: DNR Family Communication: Patient has a legal guardian, who is agreeable for SNF Disposition Plan: Pending improvement, most likely back to facility with physical therapy.  Consultants:   Orthopedic  Procedures:  Antimicrobials:  Keflex  Data Reviewed: I have personally reviewed following labs and imaging studies  CBC: Recent Labs  Lab 09/21/19 0802 09/22/19 0425 09/23/19 0604  WBC 10.2 9.7 11.5*  NEUTROABS 6.2  --  7.5  HGB 13.0 10.9* 10.6*  HCT 41.0 34.5* 33.5*  MCV 90.5 90.6 91.8  PLT 327 238 204   Basic Metabolic Panel: Recent Labs  Lab 09/21/19 0802 09/22/19 0425 09/23/19 0604 09/24/19 0436  NA 143 140 137 134*  K 3.6 3.9 3.6 3.9  CL 105 108 104 101  CO2 27 24 22 23   GLUCOSE 103* 91 96 94  BUN 22 22 19 19   CREATININE 0.78 0.73 0.75 0.65  CALCIUM 9.6 8.5* 8.1* 8.2*   GFR: Estimated Creatinine Clearance: 42.5 mL/min (by C-G formula based on SCr of 0.65 mg/dL). Liver Function Tests: Recent Labs  Lab 09/21/19 0802  AST 22  ALT 20  ALKPHOS 68  BILITOT 0.5  PROT 6.9  ALBUMIN 3.7     Recent Results (from  the past 240 hour(s))  Respiratory Panel by RT PCR (Flu A&B, Covid) - Nasopharyngeal Swab     Status: None   Collection Time: 09/21/19 12:59 PM   Specimen: Nasopharyngeal Swab  Result Value Ref Range Status   SARS Coronavirus 2 by RT PCR NEGATIVE NEGATIVE Final    Comment: (NOTE) SARS-CoV-2 target nucleic acids are NOT DETECTED. The SARS-CoV-2 RNA is generally detectable in upper respiratoy specimens during the acute phase of infection. The lowest concentration of SARS-CoV-2 viral copies this  assay can detect is 131 copies/mL. A negative result does not preclude SARS-Cov-2 infection and should not be used as the sole basis for treatment or other patient management decisions. A negative result may occur with  improper specimen collection/handling, submission of specimen other than nasopharyngeal swab, presence of viral mutation(s) within the areas targeted by this assay, and inadequate number of viral copies (<131 copies/mL). A negative result must be combined with clinical observations, patient history, and epidemiological information. The expected result is Negative. Fact Sheet for Patients:  https://www.moore.com/ Fact Sheet for Healthcare Providers:  https://www.young.biz/ This test is not yet ap proved or cleared by the [place] FDA and  has been authorized for detection and/or diagnosis of SARS-CoV-2 by FDA under an Emergency Use Authorization (EUA). This EUA will remain  in effect (meaning this test can be used) for the duration of the COVID-19 declaration under Section 564(b)(1) of the Act, 21 U.S.C. section 360bbb-3(b)(1), unless the authorization is terminated or revoked sooner.    Influenza A by PCR NEGATIVE NEGATIVE Final   Influenza B by PCR NEGATIVE NEGATIVE Final    Comment: (NOTE) The Xpert Xpress SARS-CoV-2/FLU/RSV assay is intended as an aid in  the diagnosis of influenza from Nasopharyngeal swab specimens and  should not be used as a sole basis for treatment. Nasal washings and  aspirates are unacceptable for Xpert Xpress SARS-CoV-2/FLU/RSV  testing. Fact Sheet for Patients: https://www.moore.com/ Fact Sheet for Healthcare Providers: https://www.young.biz/ This test is not yet approved or cleared by the [place] FDA and  has been authorized for detection and/or diagnosis of SARS-CoV-2 by  FDA under an Emergency Use Authorization (EUA). This EUA will remain  in  effect (meaning this test can be used) for the duration of the  Covid-19 declaration under Section 564(b)(1) of the Act, 21  U.S.C. section 360bbb-3(b)(1), unless the authorization is  terminated or revoked. Performed at [facility], [address] [place], [place] [address]   MRSA PCR Screening     Status: None   Collection Time: 09/21/19  9:12 PM   Specimen: Nasopharyngeal  Result Value Ref Range Status   MRSA by PCR NEGATIVE NEGATIVE Final    Comment:        The GeneXpert MRSA Assay (FDA approved for NASAL specimens only), is one component of a comprehensive MRSA colonization surveillance program. It is not intended to diagnose MRSA infection nor to guide or monitor treatment for MRSA infections. Performed at [facility], [address], [place], [place] [address]   Urine Culture     Status: None   Collection Time: 09/23/19  1:54 AM   Specimen: Urine, Random  Result Value Ref Range Status   Specimen Description   Final    URINE, RANDOM Performed at [facility], [address], [place], [place] [address]    Special Requests   Final    NONE Performed at [facility], [address] [place] [place], [place] [address]  Culture   Final    NO GROWTH Performed at [facility], [place] [address] [place], [place] [address]    Report Status 09/24/2019 FINAL  Final     Radiology Studies: No results found.  Scheduled Meds: . aspirin  81 mg Oral Daily  . cephALEXin  500 mg Oral Q8H  . divalproex  125 mg Oral BID  . enoxaparin (LOVENOX) injection  40 mg Subcutaneous Q24H  . feeding supplement  1 Container Oral TID BM  . feeding supplement (PRO-STAT SUGAR FREE 64)  30 mL Oral BID  . mouth rinse  15 mL Mouth Rinse BID  . mirtazapine  30 mg Oral Daily  . OLANZapine  5 mg Oral QHS  . pantoprazole  40 mg Oral Daily  . pneumococcal 23 valent vaccine  0.5 mL Intramuscular Tomorrow-1000  . rivastigmine  6 mg Oral BID  . vortioxetine  HBr  20 mg Oral Daily   Continuous Infusions: . sodium chloride 100 mL/hr at 09/24/19 1500     LOS: 3 days   Time spent: 30 minutes  Max Sane, MD [facility] Pager [phone]  If 7PM-7AM, please contact night-coverage [email or web address] Password [facility] 09/24/2019, 8:33 PM   This record has been created using Dragon voice recognition software. Errors have been sought and corrected,but may not always be located. Such creation errors do not reflect on the standard of care.

## 2019-09-24 NOTE — Progress Notes (Signed)
PHARMACY NOTE:  ANTIMICROBIAL RENAL DOSAGE ADJUSTMENT  Current antimicrobial regimen includes a mismatch between antimicrobial dosage and estimated renal function.  As per policy approved by the Pharmacy & Therapeutics and Medical Executive Committees, the antimicrobial dosage will be adjusted accordingly.  Current antimicrobial dosage:  Cephalexin 500 mg PO q12h  Indication: UTI  Renal Function:  Estimated Creatinine Clearance: 42.5 mL/min (by C-G formula based on SCr of 0.65 mg/dL).     Antimicrobial dosage has been changed to:   Cephalexin 500 mg PO q8h   Thank you for allowing pharmacy to be a part of this patient's care.  Marty Heck, [facility] 09/24/2019 10:44 AM

## 2019-09-24 NOTE — TOC Initial Note (Signed)
Transition of Care [facility]) - Initial/Assessment Note    Patient Details  Name: [name] MRN: [id number] Date of Birth: 1940-11-03  Transition of Care [facility]) CM/SW Contact:    Margarito Liner, LCSW Phone Number: 09/24/2019, 3:53 PM  Clinical Narrative: Received call back from legal guardian. CSW introduced role and explained that PT recommendations would be discussed. Guardian is agreeable to SNF placement. She stated patient is typically pretty independent at [facility] where she lives, based on what they tell her. Will follow up once bed offers available. No further concerns. CSW encouraged patient's guardian to contact CSW as needed. CSW will continue to follow patient and her guardian for support and facilitate discharge to SNF once medically stable.                 Expected Discharge Plan: Skilled Nursing Facility Barriers to Discharge: SNF Pending bed offer   Patient Goals and CMS Choice        Expected Discharge Plan and Services Expected Discharge Plan: Skilled Nursing Facility     Post Acute Care Choice: Skilled Nursing Facility Living arrangements for the past 2 months: Assisted Living Facility                                      Prior Living Arrangements/Services Living arrangements for the past 2 months: Assisted Living Facility Lives with:: Facility Resident Patient language and need for interpreter reviewed:: Yes Do you feel safe going back to the place where you live?: Yes      Need for Family Participation in Patient Care: Yes (Comment) Care giver support system in place?: Yes (comment)   Criminal Activity/Legal Involvement Pertinent to Current Situation/Hospitalization: No - Comment as needed  Activities of Daily Living Home Assistive Devices/Equipment: Dan Humphreys (specify type) ADL Screening (condition at time of admission) Patient's cognitive ability adequate to safely complete daily activities?: Yes Is the patient deaf or have difficulty  hearing?: No Does the patient have difficulty seeing, even when wearing glasses/contacts?: No Does the patient have difficulty concentrating, remembering, or making decisions?: No Patient able to express need for assistance with ADLs?: Yes Does the patient have difficulty dressing or bathing?: Yes Independently performs ADLs?: Yes (appropriate for developmental age) Does the patient have difficulty walking or climbing stairs?: Yes Weakness of Legs: None Weakness of Arms/Hands: None  Permission Sought/Granted Permission sought to share information with : Facility Medical sales representative    Share Information with NAME: Pietro Cassis  Permission granted to share info w AGENCY: SNF's  Permission granted to share info w Relationship: Legal Guardian  Permission granted to share info w Contact Information: [phone]  Emotional Assessment Appearance:: Appears stated age Attitude/Demeanor/Rapport: Unable to Assess Affect (typically observed): Unable to Assess Orientation: : Oriented to Self, Oriented to Place, Oriented to Situation Alcohol / Substance Use: Not Applicable Psych Involvement: No (comment)  Admission diagnosis:  Pain [R52] Fall [W19.XXXA] Closed left subtrochanteric femur fracture (HCC) [S72.22XA] Closed fracture of left hip, initial encounter (HCC) [S72.002A] Fall, initial encounter [W19.XXXA] Patient Active Problem List   Diagnosis Date Noted  . Fall   . Closed left subtrochanteric femur fracture (HCC) 09/21/2019   PCP:  No primary care provider on file. Pharmacy:  No Pharmacies Listed    Social Determinants of Health (SDOH) Interventions    Readmission Risk Interventions No flowsheet data found.

## 2019-09-24 NOTE — Clinical Social Work Note (Signed)
Left voicemail for legal guardian. Will discuss SNF recommendation when she calls back.  Charlynn Court, CSW [phone]

## 2019-09-24 NOTE — Progress Notes (Signed)
Subjective: 2 Days Post-Op Procedure(s) (LRB): ARTHROPLASTY BIPOLAR HIP (HEMIARTHROPLASTY) RIGHT (Left) Patient reports pain as mild, denies any significant hip pain this morning. Patient is well, and has had no acute complaints or problems Denies any CP, SOB, ABD pain. We will continue therapy today.  Plan is to go Skilled nursing facility after hospital stay. Being treated for UTI but denies any symptoms this morning.  Objective: Vital signs in last 24 hours: Temp:  [97.5 F (36.4 C)-100.1 F (37.8 C)] 100.1 F (37.8 C) (01/11 0738) Pulse Rate:  [77-83] 83 (01/11 0742) Resp:  [16] 16 (01/10 2350) BP: (85-106)/(48-65) 102/48 (01/11 0742) SpO2:  [86 %-98 %] 98 % (01/11 0738)  Intake/Output from previous day: 01/10 0701 - 01/11 0700 In: 333 [I.V.:333] Out: 400 [Urine:400] Intake/Output this shift: No intake/output data recorded.  Recent Labs    09/21/19 0802 09/22/19 0425 09/23/19 0604  HGB 13.0 10.9* 10.6*   Recent Labs    09/22/19 0425 09/23/19 0604  WBC 9.7 11.5*  RBC 3.81* 3.65*  HCT 34.5* 33.5*  PLT 238 204   Recent Labs    09/23/19 0604 09/24/19 0436  NA 137 134*  K 3.6 3.9  CL 104 101  CO2 22 23  BUN 19 19  CREATININE 0.75 0.65  GLUCOSE 96 94  CALCIUM 8.1* 8.2*   No results for input(s): LABPT, INR in the last 72 hours.  EXAM General - Patient is Alert and Appropriate Extremity - Neurovascular intact Sensation intact distally Intact pulses distally Dorsiflexion/Plantar flexion intact No cellulitis present Compartment soft Dressing - dressing C/D/I and no drainage Motor Function - intact, moving foot and toes well on exam.   Past Medical History:  Diagnosis Date  . Alzheimer's dementia (HCC)   . Insomnia   . Low back pain   . Vomiting     Assessment/Plan:   2 Days Post-Op Procedure(s) (LRB): ARTHROPLASTY BIPOLAR HIP (HEMIARTHROPLASTY) RIGHT (Left) Active Problems:   Closed left subtrochanteric femur fracture (HCC)    Fall  Estimated body mass index is 16.06 kg/m as calculated from the following:   Height as of this encounter: 5' 7.5" (1.715 m).   Weight as of this encounter: 47.2 kg. Advance diet Up with therapy   Recent temp of 100.1, patient is being treated for a UTI.  Currenly on Keflex Needs bowel movement Denies any pain this morning. Continue with PT today, plan for d/c to SNF when able.  Follow-up with [facility] orthopedics in 2 weeks for staple removal and Steri-Strip application Lovenox 40 mg subcu daily x14 days after discharge TED hose bilateral lower extremities x6 weeks  DVT Prophylaxis - Lovenox, TED hose and SCDs Weight-Bearing as tolerated to left leg   J. Horris Latino, PA-C [facility] 09/24/2019, 7:57 AM

## 2019-09-24 NOTE — Progress Notes (Signed)
Physical Therapy Treatment Patient Details Name: [name] MRN: [id number] DOB: 1941/09/03 Today's Date: 09/24/2019    History of Present Illness Patient is a 79 year old female admitted after fall. She is now s/p left hemiarthroplasty. PMH incliudes; alzheimers.    PT Comments    Pt presented with deficits in strength, transfers, mobility, gait, balance, and activity tolerance.  Pt continues to require extensive physical assistance with mobility and required max verbal and tactile cues to participate with below therex.  Pt demonstrated fair sitting balance at the OOB during seated therex but prior to attempting to stand the pt had an extensive diarrhea BM and was returned to supine for cleaning.  Pt will benefit from PT services in a SNF setting upon discharge to safely address above deficits for decreased caregiver assistance and eventual return to PLOF.     Follow Up Recommendations  SNF     Equipment Recommendations  None recommended by PT    Recommendations for Other Services       Precautions / Restrictions Precautions Precautions: Fall Restrictions Weight Bearing Restrictions: Yes LLE Weight Bearing: Weight bearing as tolerated    Mobility  Bed Mobility Overal bed mobility: Needs Assistance Bed Mobility: Supine to Sit;Sit to Supine     Supine to sit: Max assist Sit to supine: Max assist   General bed mobility comments: Pt assisted minimally by reaching for the bed rail during sup to/from sit  Transfers                 General transfer comment: NT secondary to pt with extensive diarrhea BM at the EOB while in sitting with pt returned to supine and nursing notified  Ambulation/Gait                 Stairs             Wheelchair Mobility    Modified Rankin (Stroke Patients Only)       Balance Overall balance assessment: Needs assistance Sitting-balance support: Bilateral upper extremity supported Sitting balance-Leahy Scale:  Fair                                      Cognition Arousal/Alertness: Lethargic Behavior During Therapy: Flat affect Overall Cognitive Status: No family/caregiver present to determine baseline cognitive functioning                                        Exercises Total Joint Exercises Ankle Circles/Pumps: AAROM;Both;5 reps;10 reps Short Arc Quad: AAROM;Both;5 reps;10 reps Heel Slides: AAROM;Both;5 reps;10 reps Hip ABduction/ADduction: AAROM;Both;5 reps;10 reps Straight Leg Raises: AAROM;Both;5 reps;10 reps Long Arc Quad: AAROM;Both;10 reps;Strengthening Knee Flexion: AAROM;Both;10 reps;Strengthening    General Comments        Pertinent Vitals/Pain Pain Assessment: 0-10 Pain Score: 10-Worst pain ever(NAD during the session) Pain Location: Left hip Pain Intervention(s): Premedicated before session;Monitored during session    Home Living                      Prior Function            PT Goals (current goals can now be found in the care plan section) Progress towards PT goals: Progressing toward goals    Frequency    BID      PT Plan Current  plan remains appropriate    Co-evaluation              AM-PAC PT "6 Clicks" Mobility   Outcome Measure  Help needed turning from your back to your side while in a flat bed without using bedrails?: A Lot Help needed moving from lying on your back to sitting on the side of a flat bed without using bedrails?: Total Help needed moving to and from a bed to a chair (including a wheelchair)?: Total Help needed standing up from a chair using your arms (e.g., wheelchair or bedside chair)?: Total Help needed to walk in hospital room?: Total Help needed climbing 3-5 steps with a railing? : Total 6 Click Score: 7    End of Session Equipment Utilized During Treatment: Oxygen Activity Tolerance: Patient tolerated treatment well Patient left: in bed;with call bell/phone within  reach;with nursing/sitter in room Nurse Communication: Mobility status PT Visit Diagnosis: Muscle weakness (generalized) (M62.81);History of falling (Z91.81);Other abnormalities of gait and mobility (R26.89);Difficulty in walking, not elsewhere classified (R26.2);Pain;Unsteadiness on feet (R26.81) Pain - Right/Left: Left Pain - part of body: Hip     Time: 8177-1165 PT Time Calculation (min) (ACUTE ONLY): 27 min  Charges:  $Therapeutic Exercise: 23-37 mins                     D. Scott Shahan Starks PT, DPT 09/24/19, 12:09 PM

## 2019-09-24 NOTE — NC FL2 (Signed)
[place] LEVEL OF CARE SCREENING TOOL     IDENTIFICATION  Patient Name: [name] Birthdate: [birth year] Sex: female Admission Date (Current Location): 09/21/2019  [facility] and [place] Number:  Engineering geologist and Address:  [facility], [address], [place], [place] [address]      Provider Number: [id number]  Attending Physician Name and Address:  Max Sane, MD  Relative Name and Phone Number:       Current Level of Care: Hospital Recommended Level of Care: [place] Prior Approval Number:    Date Approved/Denied:   PASRR Number: Manual review  Discharge Plan: SNF    Current Diagnoses: Patient Active Problem List   Diagnosis Date Noted  . Fall   . Closed left subtrochanteric femur fracture ([place]) 09/21/2019    Orientation RESPIRATION BLADDER Height & Weight     Self, Situation, Place  O2(Nasal Canula 2 L) Continent, External catheter Weight: 104 lb 0.9 oz (47.2 kg) Height:  5' 7.5" (171.5 cm)  BEHAVIORAL SYMPTOMS/MOOD NEUROLOGICAL BOWEL NUTRITION STATUS  (None) (Alzheimer's) Continent Diet  AMBULATORY STATUS COMMUNICATION OF NEEDS Skin   Extensive Assist Verbally Surgical wounds                       Personal Care Assistance Level of Assistance              Functional Limitations Info  Sight, Hearing, Speech Sight Info: Adequate Hearing Info: Adequate Speech Info: Adequate    SPECIAL CARE FACTORS FREQUENCY  PT (By licensed PT)     PT Frequency: 5 x week              Contractures Contractures Info: Not present    Additional Factors Info  Code Status, Allergies Code Status Info: DNR Allergies Info: NKDA           Current Medications (09/24/2019):  This is the current hospital active medication list Current Facility-Administered Medications  Medication Dose Route Frequency Provider Last Rate Last Admin  . 0.9 %  sodium chloride infusion   Intravenous Continuous  Bodenheimer, Charles A, NP 100 mL/hr at 09/24/19 1500 Rate Verify at 09/24/19 1500  . aspirin chewable tablet 81 mg  81 mg Oral Daily Lorella Nimrod, MD   81 mg at 09/24/19 0805  . bisacodyl (DULCOLAX) suppository 10 mg  10 mg Rectal Daily PRN [name], Marshall Cork, MD   10 mg at 09/24/19 0539  . cephALEXin (KEFLEX) capsule 500 mg  500 mg Oral Q8H Rocky Morel, RPH   500 mg at 09/24/19 1544  . diphenhydrAMINE (BENADRYL) 12.5 MG/5ML elixir 12.5-25 mg  12.5-25 mg Oral Q4H PRN [name], Marshall Cork, MD      . divalproex (DEPAKOTE) DR tablet 125 mg  125 mg Oral BID Lorella Nimrod, MD   125 mg at 09/24/19 0805  . docusate sodium (COLACE) capsule 100 mg  100 mg Oral BID Corky Mull, MD   100 mg at 09/24/19 0805  . enoxaparin (LOVENOX) injection 40 mg  40 mg Subcutaneous Q24H [name], Marshall Cork, MD   40 mg at 09/24/19 0803  . feeding supplement (BOOST / RESOURCE BREEZE) liquid 1 Container  1 Container Oral TID BM Lorella Nimrod, MD   1 Container at 09/24/19 1544  . feeding supplement (PRO-STAT SUGAR FREE 64) liquid 30 mL  30 mL Oral BID Lorella Nimrod, MD   30 mL at 09/24/19 1055  . HYDROcodone-acetaminophen (NORCO/VICODIN) 5-325 MG per  tablet 1-2 tablet  1-2 tablet Oral Q4H PRN [name], Excell Seltzer, MD   2 tablet at 09/24/19 0751  . lactulose (CHRONULAC) 10 GM/15ML solution 30 g  30 g Oral Daily Arnetha Courser, MD   30 g at 09/24/19 0827  . magnesium hydroxide (MILK OF MAGNESIA) suspension 30 mL  30 mL Oral Daily Arnetha Courser, MD   30 mL at 09/24/19 0813  . MEDLINE mouth rinse  15 mL Mouth Rinse BID [name], Excell Seltzer, MD   15 mL at 09/24/19 1056  . metoCLOPramide (REGLAN) tablet 5-10 mg  5-10 mg Oral Q8H PRN [name], Excell Seltzer, MD       Or  . metoCLOPramide (REGLAN) injection 5-10 mg  5-10 mg Intravenous Q8H PRN [name], Excell Seltzer, MD      . mirtazapine (REMERON) tablet 30 mg  30 mg Oral Daily [name], Excell Seltzer, MD   30 mg at 09/24/19 0804  . morphine 4 MG/ML injection 4 mg  4 mg Intravenous Q6H PRN [name], Excell Seltzer, MD   4 mg at 09/21/19 1915  .  OLANZapine (ZYPREXA) tablet 5 mg  5 mg Oral QHS Arnetha Courser, MD   5 mg at 09/23/19 2255  . ondansetron (ZOFRAN) tablet 4 mg  4 mg Oral Q6H PRN [name], Excell Seltzer, MD   4 mg at 09/23/19 0901   Or  . ondansetron (ZOFRAN) injection 4 mg  4 mg Intravenous Q6H PRN [name], Excell Seltzer, MD      . pantoprazole (PROTONIX) EC tablet 40 mg  40 mg Oral Daily [name], Excell Seltzer, MD   40 mg at 09/24/19 0805  . pneumococcal 23 valent vaccine (PNEUMOVAX-23) injection 0.5 mL  0.5 mL Intramuscular Tomorrow-1000 Lurene Shadow, MD      . polyethylene glycol (MIRALAX / GLYCOLAX) packet 17 g  17 g Oral BID Christena Flake, MD   17 g at 09/24/19 0809  . rivastigmine (EXELON) capsule 6 mg  6 mg Oral BID Arnetha Courser, MD   6 mg at 09/24/19 0806  . sodium phosphate (FLEET) 7-19 GM/118ML enema 1 enema  1 enema Rectal Once PRN [name], Excell Seltzer, MD      . traMADol Janean Sark) tablet 50 mg  50 mg Oral Q6H PRN [name], Excell Seltzer, MD      . vortioxetine HBr (TRINTELLIX) tablet 20 mg  20 mg Oral Daily Arnetha Courser, MD   20 mg at 09/24/19 8341     Discharge Medications: Please see discharge summary for a list of discharge medications.  Relevant Imaging Results:  Relevant Lab Results:   Additional Information SS#: [id number]. Has a legal guardian through [facility] DSS: Pietro Cassis [phone]. Lives at [facility].  Margarito Liner, LCSW

## 2019-09-24 NOTE — Progress Notes (Signed)
Physical Therapy Treatment Patient Details Name: [name] MRN: [id number] DOB: 10/03/1940 Today's Date: 09/24/2019    History of Present Illness Patient is a 79 year old female admitted after fall. She is now s/p left hemiarthroplasty. PMH incliudes; alzheimers.    PT Comments    Pt presented with deficits in strength, transfers, mobility, gait, balance, and activity tolerance.  Per nursing pt continued to be experiencing significant diarrhea with session limited to bed mobility and therex per below.  Pt does participate during the session but requires extensive multimodal cuing and encouragement to do so.  Pt's SpO2 and HR WNL during the session. Pt will benefit from PT services in a SNF setting upon discharge to safely address above deficits for decreased caregiver assistance and eventual return to PLOF.     Follow Up Recommendations  SNF     Equipment Recommendations  None recommended by PT    Recommendations for Other Services       Precautions / Restrictions Precautions Precautions: Fall Restrictions Weight Bearing Restrictions: Yes LLE Weight Bearing: Weight bearing as tolerated    Mobility  Bed Mobility Overal bed mobility: Needs Assistance Bed Mobility: Rolling Rolling: Mod assist   Supine to sit: Max assist Sit to supine: Max assist   General bed mobility comments: Mod A and verbal/tactile cues for sequencing for rolling left/right training  Transfers                 General transfer comment: NT this session secondary to pt continuing to have significant diarrhea  Ambulation/Gait             General Gait Details: NT   Stairs             Wheelchair Mobility    Modified Rankin (Stroke Patients Only)       Balance Overall balance assessment: Needs assistance Sitting-balance support: Bilateral upper extremity supported Sitting balance-Leahy Scale: Fair                                      Cognition  Arousal/Alertness: Lethargic Behavior During Therapy: Flat affect Overall Cognitive Status: No family/caregiver present to determine baseline cognitive functioning                                        Exercises Total Joint Exercises Ankle Circles/Pumps: AAROM;Both;10 reps;15 reps Towel Squeeze: AAROM;Both;10 reps;15 reps Short Arc Quad: AAROM;Both;10 reps;15 reps Heel Slides: AAROM;Both;10 reps;15 reps Hip ABduction/ADduction: AAROM;Both;10 reps;15 reps Straight Leg Raises: AAROM;Both;10 reps;15 reps Long Arc Quad: AAROM;Both;10 reps;Strengthening Knee Flexion: AAROM;Both;10 reps;Strengthening Other Exercises Other Exercises: Gentle rolling left/right with extensive verbal/tactile cues for sequencing    General Comments        Pertinent Vitals/Pain Pain Assessment: 0-10 Pain Score: 10-Worst pain ever(NAD during the session) Pain Location: Left hip Pain Intervention(s): Premedicated before session;Monitored during session    Home Living                      Prior Function            PT Goals (current goals can now be found in the care plan section) Progress towards PT goals: Progressing toward goals    Frequency    BID      PT Plan Current plan remains appropriate  Co-evaluation              AM-PAC PT "6 Clicks" Mobility   Outcome Measure  Help needed turning from your back to your side while in a flat bed without using bedrails?: A Lot Help needed moving from lying on your back to sitting on the side of a flat bed without using bedrails?: Total Help needed moving to and from a bed to a chair (including a wheelchair)?: Total Help needed standing up from a chair using your arms (e.g., wheelchair or bedside chair)?: Total Help needed to walk in hospital room?: Total Help needed climbing 3-5 steps with a railing? : Total 6 Click Score: 7    End of Session Equipment Utilized During Treatment: Oxygen Activity Tolerance:  Patient tolerated treatment well Patient left: in bed;with call bell/phone within reach;with bed alarm set Nurse Communication: Mobility status PT Visit Diagnosis: Muscle weakness (generalized) (M62.81);History of falling (Z91.81);Other abnormalities of gait and mobility (R26.89);Difficulty in walking, not elsewhere classified (R26.2);Pain;Unsteadiness on feet (R26.81) Pain - Right/Left: Left Pain - part of body: Hip     Time: 8375-4237 PT Time Calculation (min) (ACUTE ONLY): 23 min  Charges:  $Therapeutic Exercise: 8-22 mins $Therapeutic Activity: 8-22 mins                     D. Scott Cecille Mcclusky PT, DPT 09/24/19, 2:14 PM

## 2019-09-25 DIAGNOSIS — L8990 Pressure ulcer of unspecified site, unspecified stage: Secondary | ICD-10-CM | POA: Insufficient documentation

## 2019-09-25 LAB — BASIC METABOLIC PANEL
Anion gap: 8 (ref 5–15)
BUN: 17 mg/dL (ref 8–23)
CO2: 25 mmol/L (ref 22–32)
Calcium: 8.1 mg/dL — ABNORMAL LOW (ref 8.9–10.3)
Chloride: 106 mmol/L (ref 98–111)
Creatinine, Ser: 0.55 mg/dL (ref 0.44–1.00)
GFR calc Af Amer: >60 mL/min (ref >60)
GFR calc non Af Amer: >60 mL/min (ref >60)
Glucose, Bld: 100 mg/dL — ABNORMAL HIGH (ref 70–99)
Potassium: 3.7 mmol/L (ref 3.5–5.1)
Sodium: 139 mmol/L (ref 135–145)

## 2019-09-25 LAB — CBC
HCT: 27.9 % — ABNORMAL LOW (ref 36.0–46.0)
Hemoglobin: 8.8 g/dL — ABNORMAL LOW (ref 12.0–15.0)
MCH: 28.9 pg (ref 26.0–34.0)
MCHC: 31.5 g/dL (ref 30.0–36.0)
MCV: 91.5 fL (ref 80.0–100.0)
Platelets: 210 10*3/uL (ref 150–400)
RBC: 3.05 MIL/uL — ABNORMAL LOW (ref 3.87–5.11)
RDW: 13.7 % (ref 11.5–15.5)
WBC: 10.2 10*3/uL (ref 4.0–10.5)
nRBC: 0 % (ref 0.0–0.2)

## 2019-09-25 LAB — SURGICAL PATHOLOGY

## 2019-09-25 NOTE — Progress Notes (Addendum)
Physical Therapy Treatment Patient Details Name: [name] MRN: [id number] DOB: 11/06/1940 Today's Date: 09/25/2019    History of Present Illness Patient is a 79 year old female admitted after fall. She is now s/p left hemiarthroplasty. PMH incliudes; alzheimers.    PT Comments    Pt presented with deficits in strength, transfers, mobility, gait, balance, and activity tolerance.  Pt with less fear of falling during the session than previous session and participated with transfer training and gait with less hesitation. Pt continued to require min A with gait for stability and to guide the RW and ambulated with mildly antalgic gait pattern on the LLE.  Pt will benefit from PT services in a SNF setting upon discharge to safely address above deficits for decreased caregiver assistance and eventual return to PLOF.      SNF     Equipment Recommendations  None recommended by PT    Recommendations for Other Services       Precautions / Restrictions Precautions Precautions: Fall Restrictions Weight Bearing Restrictions: Yes LLE Weight Bearing: Weight bearing as tolerated    Mobility  Bed Mobility Overal bed mobility: Needs Assistance Bed Mobility: Rolling;Supine to Sit Rolling: Mod assist   Supine to sit: Max assist     General bed mobility comments: NT, pt in recliner  Transfers Overall transfer level: Needs assistance Equipment used: Rolling walker (2 wheeled) Transfers: Sit to/from Stand Sit to Stand: Min assist         General transfer comment: Mod verbal and tactile cues for hand placement and increased trunk flexion  Ambulation/Gait Ambulation/Gait assistance: Min assist Gait Distance (Feet): 30 Feet Assistive device: Rolling walker (2 wheeled) Gait Pattern/deviations: Step-through pattern;Decreased step length - right;Decreased stance time - left;Antalgic Gait velocity: decreased   General Gait Details: Slow, cautious gait with min A for stability and to  guide the RW   Stairs             Wheelchair Mobility    Modified Rankin (Stroke Patients Only)       Balance Overall balance assessment: Needs assistance Sitting-balance support: Bilateral upper extremity supported Sitting balance-Leahy Scale: Fair     Standing balance support: Bilateral upper extremity supported;During functional activity Standing balance-Leahy Scale: Poor Standing balance comment: Min A for stability in standing                            Cognition Arousal/Alertness: Awake/alert Behavior During Therapy: Flat affect Overall Cognitive Status: No family/caregiver present to determine baseline cognitive functioning                                        Exercises Total Joint Exercises Ankle Circles/Pumps: AAROM;Both;10 reps;15 reps Towel Squeeze: Both;10 reps;Strengthening Short Arc Quad: AAROM;Both;10 reps;15 reps Heel Slides: AAROM;Both;10 reps;15 reps Hip ABduction/ADduction: AAROM;Both;10 reps;15 reps Straight Leg Raises: AAROM;Both;10 reps;15 reps Long Arc Quad: AAROM;Both;10 reps;Strengthening Knee Flexion: AAROM;Both;10 reps;Strengthening Marching in Standing: AROM;Both;10 reps;Standing Other Exercises Other Exercises: Gentle rolling left/right with extensive verbal/tactile cues for sequencing    General Comments        Pertinent Vitals/Pain Pain Assessment: No/denies pain Pain Score: 10-Worst pain ever(NAD during the session) Pain Location: Left hip Pain Intervention(s): Premedicated before session;Monitored during session;RN gave pain meds during session    Home Living  Prior Function            PT Goals (current goals can now be found in the care plan section) Progress towards PT goals: Progressing toward goals    Frequency    BID      PT Plan Current plan remains appropriate    Co-evaluation              AM-PAC PT "6 Clicks" Mobility   Outcome  Measure  Help needed turning from your back to your side while in a flat bed without using bedrails?: A Lot Help needed moving from lying on your back to sitting on the side of a flat bed without using bedrails?: Total Help needed moving to and from a bed to a chair (including a wheelchair)?: A Little Help needed standing up from a chair using your arms (e.g., wheelchair or bedside chair)?: A Little Help needed to walk in hospital room?: A Little Help needed climbing 3-5 steps with a railing? : Total 6 Click Score: 13    End of Session Equipment Utilized During Treatment: Gait belt Activity Tolerance: Patient tolerated treatment well Patient left: in chair;with call bell/phone within reach;with chair alarm set;with SCD's reapplied Nurse Communication: Mobility status PT Visit Diagnosis: Muscle weakness (generalized) (M62.81);History of falling (Z91.81);Other abnormalities of gait and mobility (R26.89);Difficulty in walking, not elsewhere classified (R26.2);Pain;Unsteadiness on feet (R26.81) Pain - Right/Left: Left Pain - part of body: Hip     Time: 14:48-14:52 PT Time Calculation (min) (ACUTE ONLY): 24 min  Charges:  $Gait Training: 8-22 mins $Therapeutic Exercise: 8-22 mins                     D. Scott Ragen Laver PT, DPT 09/25/19, 2:59 PM

## 2019-09-25 NOTE — Progress Notes (Signed)
Subjective: 3 Days Post-Op Procedure(s) (LRB): ARTHROPLASTY BIPOLAR HIP (HEMIARTHROPLASTY) RIGHT (Left) Patient reports pain as mild, denies any significant hip pain this morning. Patient is well, and has had no acute complaints or problems Denies any CP, SOB, ABD pain. We will continue therapy today.  Plan is to go Skilled nursing facility after hospital stay. Being treated for UTI but denies any symptoms this morning.  Objective: Vital signs in last 24 hours: Temp:  [98.3 F (36.8 C)-100.1 F (37.8 C)] 99.3 F (37.4 C) (01/12 0145) Pulse Rate:  [81-96] 96 (01/11 2326) Resp:  [19] 19 (01/11 2326) BP: (85-130)/(48-65) 108/55 (01/11 2326) SpO2:  [96 %-100 %] 96 % (01/11 2326)  Intake/Output from previous day: 01/11 0701 - 01/12 0700 In: 1827.7 [P.O.:954; I.V.:873.7] Out: 250 [Urine:250] Intake/Output this shift: No intake/output data recorded.  Recent Labs    09/23/19 0604 09/25/19 0427  HGB 10.6* 8.8*   Recent Labs    09/23/19 0604 09/25/19 0427  WBC 11.5* 10.2  RBC 3.65* 3.05*  HCT 33.5* 27.9*  PLT 204 210   Recent Labs    09/24/19 0436 09/25/19 0427  NA 134* 139  K 3.9 3.7  CL 101 106  CO2 23 25  BUN 19 17  CREATININE 0.65 0.55  GLUCOSE 94 100*  CALCIUM 8.2* 8.1*   No results for input(s): LABPT, INR in the last 72 hours.  EXAM General - Patient is drowsy but answers all questions this AM. Extremity - Neurovascular intact Sensation intact distally Intact pulses distally Dorsiflexion/Plantar flexion intact No cellulitis present Compartment soft Dressing - dressing C/D/I and no drainage Motor Function - intact, moving foot and toes well on exam.   Past Medical History:  Diagnosis Date  . Alzheimer's dementia (HCC)   . Insomnia   . Low back pain   . Vomiting     Assessment/Plan:   3 Days Post-Op Procedure(s) (LRB): ARTHROPLASTY BIPOLAR HIP (HEMIARTHROPLASTY) RIGHT (Left) Active Problems:   Closed left subtrochanteric femur fracture  (HCC)   Fall  Estimated body mass index is 16.06 kg/m as calculated from the following:   Height as of this encounter: 5' 7.5" (1.715 m).   Weight as of this encounter: 47.2 kg. Advance diet Up with therapy   Recent temp of 99.3, patient is being treated for a UTI.  Currenly on Keflex Patient has had several bowel movements. Denies any pain this morning. Continue with PT today, plan for d/c to SNF when able.  Follow-up with [facility]ics in 2 weeks for staple removal and Steri-Strip application Lovenox 40 mg subcu daily x14 days after discharge TED hose bilateral lower extremities x6 weeks  DVT Prophylaxis - Lovenox, TED hose and SCDs Weight-Bearing as tolerated to left leg  J. Horris Latino, PA-C [facility] 09/25/2019, 7:31 AM

## 2019-09-25 NOTE — Progress Notes (Signed)
Physical Therapy Treatment Patient Details Name: [name] MRN: [id number] DOB: [birth year] Today's Date: 09/25/2019    History of Present Illness Patient is a 79 year old female admitted after fall. She is now s/p left hemiarthroplasty. PMH incliudes; alzheimers.    PT Comments    Pt presented with deficits in strength, transfers, mobility, gait, balance, and activity tolerance but made good progress towards goals this session.  Pt assisted somewhat with bed mobility tasks and was able to stand and amb 30' with a RW.  Pt perseverated on being afraid of falling during the session with all functional tasks including bed mobility tasks but once she began to ambulate her anxiety seemed to wane somewhat.  Pt will benefit from PT services in a SNF setting upon discharge to safely address above deficits for decreased caregiver assistance and eventual return to PLOF.     Follow Up Recommendations  SNF     Equipment Recommendations  None recommended by PT    Recommendations for Other Services       Precautions / Restrictions Precautions Precautions: Fall Restrictions Weight Bearing Restrictions: Yes LLE Weight Bearing: Weight bearing as tolerated    Mobility  Bed Mobility Overal bed mobility: Needs Assistance Bed Mobility: Rolling;Supine to Sit Rolling: Mod assist   Supine to sit: Max assist     General bed mobility comments: Rolling left/right and sup to sit with max verbal cues for sequencing with pt offering some assistance with rolling but little with sup to sit  Transfers Overall transfer level: Needs assistance Equipment used: Rolling walker (2 wheeled) Transfers: Sit to/from Stand Sit to Stand: Mod assist         General transfer comment: Max verbal and tactile cues for sequencing and max encouragement to participate with pt perseverating on believing she was going to fall  Ambulation/Gait Ambulation/Gait assistance: Min assist Gait Distance (Feet): 30  Feet Assistive device: Rolling walker (2 wheeled) Gait Pattern/deviations: Step-through pattern;Decreased step length - right;Decreased stance time - left;Antalgic Gait velocity: decreased   General Gait Details: Slow, cautious gait with min A for stability and to guide the RW   Stairs             Wheelchair Mobility    Modified Rankin (Stroke Patients Only)       Balance Overall balance assessment: Needs assistance Sitting-balance support: Bilateral upper extremity supported Sitting balance-Leahy Scale: Fair     Standing balance support: Bilateral upper extremity supported;During functional activity Standing balance-Leahy Scale: Poor Standing balance comment: Min A for stability in standing                            Cognition Arousal/Alertness: Lethargic Behavior During Therapy: Flat affect Overall Cognitive Status: No family/caregiver present to determine baseline cognitive functioning                                        Exercises Total Joint Exercises Ankle Circles/Pumps: AAROM;Both;10 reps;15 reps Short Arc Quad: AAROM;Both;10 reps;15 reps Heel Slides: AAROM;Both;10 reps;15 reps Hip ABduction/ADduction: AAROM;Both;10 reps;15 reps Straight Leg Raises: AAROM;Both;10 reps;15 reps Other Exercises Other Exercises: Gentle rolling left/right with extensive verbal/tactile cues for sequencing    General Comments        Pertinent Vitals/Pain Pain Assessment: 0-10 Pain Score: 10-Worst pain ever(NAD during the session) Pain Location: Left hip Pain Intervention(s): Premedicated before  session;Monitored during session;RN gave pain meds during session    Home Living                      Prior Function            PT Goals (current goals can now be found in the care plan section) Progress towards PT goals: Progressing toward goals    Frequency    BID      PT Plan Current plan remains appropriate     Co-evaluation              AM-PAC PT "6 Clicks" Mobility   Outcome Measure  Help needed turning from your back to your side while in a flat bed without using bedrails?: A Lot Help needed moving from lying on your back to sitting on the side of a flat bed without using bedrails?: Total Help needed moving to and from a bed to a chair (including a wheelchair)?: A Little Help needed standing up from a chair using your arms (e.g., wheelchair or bedside chair)?: A Little Help needed to walk in hospital room?: A Little Help needed climbing 3-5 steps with a railing? : Total 6 Click Score: 13    End of Session Equipment Utilized During Treatment: Gait belt Activity Tolerance: Patient tolerated treatment well Patient left: in chair;with call bell/phone within reach;with chair alarm set;with SCD's reapplied;with nursing/sitter in room Nurse Communication: Mobility status PT Visit Diagnosis: Muscle weakness (generalized) (M62.81);History of falling (Z91.81);Other abnormalities of gait and mobility (R26.89);Difficulty in walking, not elsewhere classified (R26.2);Pain;Unsteadiness on feet (R26.81) Pain - Right/Left: Left Pain - part of body: Hip     Time: 9678-9381 PT Time Calculation (min) (ACUTE ONLY): 24 min  Charges:  $Gait Training: 8-22 mins $Therapeutic Exercise: 8-22 mins                     D. Scott Kenlee Maler PT, DPT 09/25/19, 1:37 PM

## 2019-09-25 NOTE — Progress Notes (Signed)
PROGRESS NOTE    [name]  TXM:[id number] DOB: [birth year] DOA: 09/21/2019 PCP: No primary care provider on file.   Brief Narrative:  [name] is an 79 y.o. female with medical history significant for low back pain, Alzheimer's dementia, presented today hospital after a mechanical fall at [facility].  X-ray shows left femoral neck fracture. s/p Hemi arthroplasty.  Subjective: Sitting in chair. Nursing reported some urinary retention based on bladder scan, I & O requested  Assessment & Plan:   Active Problems:   Closed left subtrochanteric femur fracture (HCC)   Fall   Pressure injury of skin  Closed left subtrochanteric femur fracture (HCC)S/P hemiarthroplasty. -Postoperative day 2 -Pain management. -PT/OT recommends SNF -Lovenox 40 mg subcu daily x14 days after discharge -TED hose bilateral lower extremities x6 weeks  Bacteriuria.  Patient denies any urinary symptoms but she also has dementia. Mild leukocytosis, remained afebrile. Urine cultures - no growth, stop abx   Alzheimer's dementia and depression. -Continue home meds.  Objective: Vitals:   09/24/19 2326 09/25/19 0145 09/25/19 0807 09/25/19 1739  BP: (!) 108/55  (!) 100/58 (!) 101/36  Pulse: 96  75 85  Resp: 19  18 20   Temp: 100.1 F (37.8 C) 99.3 F (37.4 C) 98.7 F (37.1 C) 97.6 F (36.4 C)  TempSrc: Oral Oral Oral   SpO2: 96%  98% 94%  Weight:      Height:        Intake/Output Summary (Last 24 hours) at 09/25/2019 2024 Last data filed at 09/25/2019 1700 Gross per 24 hour  Intake 1194 ml  Output 800 ml  Net 394 ml   Filed Weights   09/21/19 0740 09/21/19 2039  Weight: 54.4 kg 47.2 kg    Examination:  General exam: Chronically ill-appearing elderly lady, appears calm and comfortable  Respiratory system: Clear to auscultation. Respiratory effort normal. Cardiovascular system: S1 & S2 heard, RRR. No JVD, murmurs, rubs, gallops or clicks. Gastrointestinal system: Soft,  nontender, nondistended, bowel sounds positive. Central nervous system: Alert and oriented. No focal neurological deficits.Symmetric 5 x 5 power. Extremities: No edema, no cyanosis, pulses intact and symmetrical. Skin: No rashes, lesions or ulcers Psychiatry: Judgement and insight appear normal. Mood & affect appropriate.    DVT prophylaxis: Lovenox  Code Status: DNR Family Communication: Patient has a legal guardian, who is agreeable for SNF Disposition Plan: TOC team working with legal guardian for placement  Consultants:   Orthopedic  Procedures:  Antimicrobials:  Keflex  Data Reviewed: I have personally reviewed following labs and imaging studies  CBC: Recent Labs  Lab 09/21/19 0802 09/22/19 0425 09/23/19 0604 09/25/19 0427  WBC 10.2 9.7 11.5* 10.2  NEUTROABS 6.2  --  7.5  --   HGB 13.0 10.9* 10.6* 8.8*  HCT 41.0 34.5* 33.5* 27.9*  MCV 90.5 90.6 91.8 91.5  PLT 327 238 204 210   Basic Metabolic Panel: Recent Labs  Lab 09/21/19 0802 09/22/19 0425 09/23/19 0604 09/24/19 0436 09/25/19 0427  NA 143 140 137 134* 139  K 3.6 3.9 3.6 3.9 3.7  CL 105 108 104 101 106  CO2 27 24 22 23 25   GLUCOSE 103* 91 96 94 100*  BUN 22 22 19 19 17   CREATININE 0.78 0.73 0.75 0.65 0.55  CALCIUM 9.6 8.5* 8.1* 8.2* 8.1*   GFR: Estimated Creatinine Clearance: 42.5 mL/min (by C-G formula based on SCr of 0.55 mg/dL). Liver Function Tests: Recent Labs  Lab 09/21/19 0802  AST 22  ALT 20  ALKPHOS 68  BILITOT 0.5  PROT 6.9  ALBUMIN 3.7     Recent Results (from the past 240 hour(s))  Respiratory Panel by RT PCR (Flu A&B, Covid) - Nasopharyngeal Swab     Status: None   Collection Time: 09/21/19 12:59 PM   Specimen: Nasopharyngeal Swab  Result Value Ref Range Status   SARS Coronavirus 2 by RT PCR NEGATIVE NEGATIVE Final    Comment: (NOTE) SARS-CoV-2 target nucleic acids are NOT DETECTED. The SARS-CoV-2 RNA is generally detectable in upper respiratoy specimens during the  acute phase of infection. The lowest concentration of SARS-CoV-2 viral copies this assay can detect is 131 copies/mL. A negative result does not preclude SARS-Cov-2 infection and should not be used as the sole basis for treatment or other patient management decisions. A negative result may occur with  improper specimen collection/handling, submission of specimen other than nasopharyngeal swab, presence of viral mutation(s) within the areas targeted by this assay, and inadequate number of viral copies (<131 copies/mL). A negative result must be combined with clinical observations, patient history, and epidemiological information. The expected result is Negative. Fact Sheet for Patients:  https://www.moore.com/ Fact Sheet for Healthcare Providers:  https://www.young.biz/ This test is not yet ap proved or cleared by the [place] FDA and  has been authorized for detection and/or diagnosis of SARS-CoV-2 by FDA under an Emergency Use Authorization (EUA). This EUA will remain  in effect (meaning this test can be used) for the duration of the COVID-19 declaration under Section 564(b)(1) of the Act, 21 U.S.C. section 360bbb-3(b)(1), unless the authorization is terminated or revoked sooner.    Influenza A by PCR NEGATIVE NEGATIVE Final   Influenza B by PCR NEGATIVE NEGATIVE Final    Comment: (NOTE) The Xpert Xpress SARS-CoV-2/FLU/RSV assay is intended as an aid in  the diagnosis of influenza from Nasopharyngeal swab specimens and  should not be used as a sole basis for treatment. Nasal washings and  aspirates are unacceptable for Xpert Xpress SARS-CoV-2/FLU/RSV  testing. Fact Sheet for Patients: https://www.moore.com/ Fact Sheet for Healthcare Providers: https://www.young.biz/ This test is not yet approved or cleared by the [place] FDA and  has been authorized for detection and/or diagnosis of SARS-CoV-2  by  FDA under an Emergency Use Authorization (EUA). This EUA will remain  in effect (meaning this test can be used) for the duration of the  Covid-19 declaration under Section 564(b)(1) of the Act, 21  U.S.C. section 360bbb-3(b)(1), unless the authorization is  terminated or revoked. Performed at [facility], [address] [place], [place] [address]   MRSA PCR Screening     Status: None   Collection Time: 09/21/19  9:12 PM   Specimen: Nasopharyngeal  Result Value Ref Range Status   MRSA by PCR NEGATIVE NEGATIVE Final    Comment:        The GeneXpert MRSA Assay (FDA approved for NASAL specimens only), is one component of a comprehensive MRSA colonization surveillance program. It is not intended to diagnose MRSA infection nor to guide or monitor treatment for MRSA infections. Performed at [facility], [address], [place], [place] [address]   Urine Culture     Status: None   Collection Time: 09/23/19  1:54 AM   Specimen: Urine, Random  Result Value Ref Range Status   Specimen Description   Final    URINE, RANDOM Performed at [facility], [address] [place], [place] [address]    Special Requests   Final  NONE Performed at [facility], [address], [place], [place] [address]    Culture   Final    NO GROWTH Performed at [facility], [place] [address], [place], [place] [address]    Report Status 09/24/2019 FINAL  Final     Radiology Studies: No results found.  Scheduled Meds: . aspirin  81 mg Oral Daily  . cephALEXin  500 mg Oral Q8H  . divalproex  125 mg Oral BID  . enoxaparin (LOVENOX) injection  40 mg Subcutaneous Q24H  . feeding supplement  1 Container Oral TID BM  . feeding supplement (PRO-STAT SUGAR FREE 64)  30 mL Oral BID  . mouth rinse  15 mL Mouth Rinse BID  . mirtazapine  30 mg Oral Daily  . OLANZapine  5 mg Oral QHS  . pantoprazole  40 mg Oral Daily  . rivastigmine  6 mg Oral BID  .  vortioxetine HBr  20 mg Oral Daily   Continuous Infusions: . sodium chloride 100 mL/hr at 09/24/19 2135     LOS: 4 days   Time spent: 30 minutes  Max Sane, MD [facility] Pager [phone]  If 7PM-7AM, please contact night-coverage [email or web address] Password [facility] 09/25/2019, 8:24 PM   This record has been created using Dragon voice recognition software. Errors have been sought and corrected,but may not always be located. Such creation errors do not reflect on the standard of care.

## 2019-09-25 NOTE — Care Management Important Message (Signed)
Important Message  Patient Details  Name: [name] MRN: [id number] Date of Birth: 07/31/1941   Medicare Important Message Given:  N/A - LOS <3 / Initial given by admissions  Initial Important Message given 09/24/19 @ 12:37 pm.   [name] 09/25/2019, 8:50 AM

## 2019-09-25 NOTE — TOC Progression Note (Addendum)
Transition of Care [facility]) - Progression Note    Patient Details  Name: [name] MRN: [id number] Date of Birth: [birth year]  [facility] CM/SW Contact  Margarito Liner, LCSW Phone Number: 09/25/2019, 2:35 PM  Clinical Narrative:  Left voicemail for guardian with bed offers: [facility], [facility], and [facility]. Notified her that all three have overall star ratings of 3/5. Awaiting call back for preference.   3:49 pm: Uploaded requested documents into [place] [employer] for PASARR review. No call back from guardian yet.  4:13 pm: PASARR is under Level II review.  Expected Discharge Plan: Skilled Nursing Facility Barriers to Discharge: SNF Pending bed offer  Expected Discharge Plan and Services Expected Discharge Plan: Skilled Nursing Facility     Post Acute Care Choice: Skilled Nursing Facility Living arrangements for the past 2 months: Assisted Living Facility                                       Social Determinants of Health (SDOH) Interventions    Readmission Risk Interventions No flowsheet data found.

## 2019-09-25 NOTE — Progress Notes (Signed)
Bladder scan revealed 995cc of urine in urinary bladder, I/'O catherization 800cc of tea colored urine noted

## 2019-09-26 DIAGNOSIS — S72002A Fracture of unspecified part of neck of left femur, initial encounter for closed fracture: Secondary | ICD-10-CM

## 2019-09-26 LAB — BASIC METABOLIC PANEL
Anion gap: 6 (ref 5–15)
BUN: 18 mg/dL (ref 8–23)
CO2: 27 mmol/L (ref 22–32)
Calcium: 8.3 mg/dL — ABNORMAL LOW (ref 8.9–10.3)
Chloride: 106 mmol/L (ref 98–111)
Creatinine, Ser: 0.58 mg/dL (ref 0.44–1.00)
GFR calc Af Amer: >60 mL/min (ref >60)
GFR calc non Af Amer: >60 mL/min (ref >60)
Glucose, Bld: 95 mg/dL (ref 70–99)
Potassium: 3.6 mmol/L (ref 3.5–5.1)
Sodium: 139 mmol/L (ref 135–145)

## 2019-09-26 LAB — PREPARE RBC (CROSSMATCH)

## 2019-09-26 LAB — CBC
HCT: 25.4 % — ABNORMAL LOW (ref 36.0–46.0)
Hemoglobin: 7.8 g/dL — ABNORMAL LOW (ref 12.0–15.0)
MCH: 28.2 pg (ref 26.0–34.0)
MCHC: 30.7 g/dL (ref 30.0–36.0)
MCV: 91.7 fL (ref 80.0–100.0)
Platelets: 259 10*3/uL (ref 150–400)
RBC: 2.77 MIL/uL — ABNORMAL LOW (ref 3.87–5.11)
RDW: 13.6 % (ref 11.5–15.5)
WBC: 8 10*3/uL (ref 4.0–10.5)
nRBC: 0 % (ref 0.0–0.2)

## 2019-09-26 LAB — ABO/RH: ABO/RH(D): O POS

## 2019-09-26 MED ORDER — SODIUM CHLORIDE 0.9% IV SOLUTION: Freq: Once | INTRAVENOUS | Status: AC

## 2019-09-26 MED ORDER — ENSURE ENLIVE PO LIQD
237.0000 mL | Freq: Two times a day (BID) | ORAL | Status: DC
  Administered 2019-09-26 (×2): 237 mL via ORAL

## 2019-09-26 NOTE — Progress Notes (Signed)
Physical Therapy Treatment Patient Details Name: [name] MRN: [id number] DOB: 08-21-1941 Today's Date: 09/26/2019    History of Present Illness Patient is a 79 year old female admitted after fall. She is now s/p left hemiarthroplasty. PMH incliudes; alzheimers.    PT Comments    Pt presented with deficits in strength, transfers, mobility, gait, balance, and activity tolerance but continued to make progress towards goals.  Pt reported no L hip pain this session and demonstrated increased effort with bed mobility tasks.  Pt was able to amb with grossly improved cadence and confidence with only min A for stability and to guide the RW.  Pt will benefit from PT services in a SNF setting upon discharge to safely address above deficits for decreased caregiver assistance and eventual return to PLOF.    Follow Up Recommendations  SNF     Equipment Recommendations  None recommended by PT    Recommendations for Other Services       Precautions / Restrictions Precautions Precautions: Fall Restrictions Weight Bearing Restrictions: No LLE Weight Bearing: Weight bearing as tolerated    Mobility  Bed Mobility Overal bed mobility: Needs Assistance Bed Mobility: Rolling;Supine to Sit;Sit to Supine Rolling: Mod assist   Supine to sit: Mod assist Sit to supine: Mod assist   General bed mobility comments: Mod A for BLE and trunk control with grossly increased pt participation/effort this session  Transfers Overall transfer level: Needs assistance Equipment used: Rolling walker (2 wheeled) Transfers: Sit to/from Stand Sit to Stand: Min assist         General transfer comment: Mod verbal and tactile cues for hand placement and increased trunk flexion  Ambulation/Gait Ambulation/Gait assistance: Min assist Gait Distance (Feet): 30 Feet x 1, 15 Feet x 1 Assistive device: Rolling walker (2 wheeled) Gait Pattern/deviations: Step-through pattern;Decreased step length -  right;Decreased stance time - left;Antalgic Gait velocity: decreased   General Gait Details: Slow, cautious gait with min A for stability and to guide the RW   Stairs             Wheelchair Mobility    Modified Rankin (Stroke Patients Only)       Balance Overall balance assessment: Needs assistance Sitting-balance support: Bilateral upper extremity supported Sitting balance-Leahy Scale: Fair     Standing balance support: Bilateral upper extremity supported;During functional activity Standing balance-Leahy Scale: Poor Standing balance comment: Min A for stability in standing                            Cognition Arousal/Alertness: Awake/alert Behavior During Therapy: Flat affect Overall Cognitive Status: No family/caregiver present to determine baseline cognitive functioning                                        Exercises Total Joint Exercises Ankle Circles/Pumps: AAROM;Both;10 reps;15 reps Short Arc Quad: AAROM;Both;10 reps Heel Slides: AAROM;Both;10 reps Hip ABduction/ADduction: AAROM;Both;10 reps Straight Leg Raises: AAROM;Both;10 reps Long Arc Quad: Both;10 reps;Strengthening;AROM Knee Flexion: Both;10 reps;Strengthening;AROM    General Comments        Pertinent Vitals/Pain Pain Assessment: No/denies pain    Home Living                      Prior Function            PT Goals (current goals can now  be found in the care plan section) Progress towards PT goals: Progressing toward goals    Frequency    BID      PT Plan Current plan remains appropriate    Co-evaluation              AM-PAC PT "6 Clicks" Mobility   Outcome Measure  Help needed turning from your back to your side while in a flat bed without using bedrails?: A Lot Help needed moving from lying on your back to sitting on the side of a flat bed without using bedrails?: A Lot Help needed moving to and from a bed to a chair (including a  wheelchair)?: A Little Help needed standing up from a chair using your arms (e.g., wheelchair or bedside chair)?: A Little Help needed to walk in hospital room?: A Little Help needed climbing 3-5 steps with a railing? : Total 6 Click Score: 14    End of Session Equipment Utilized During Treatment: Gait belt Activity Tolerance: Patient tolerated treatment well Patient left: with call bell/phone within reach;with SCD's reapplied;in bed;with bed alarm set Nurse Communication: Mobility status PT Visit Diagnosis: Muscle weakness (generalized) (M62.81);History of falling (Z91.81);Other abnormalities of gait and mobility (R26.89);Difficulty in walking, not elsewhere classified (R26.2);Pain;Unsteadiness on feet (R26.81) Pain - Right/Left: Left Pain - part of body: Hip     Time: 4742-5956 PT Time Calculation (min) (ACUTE ONLY): 27 min  Charges:  $Gait Training: 8-22 mins $Therapeutic Exercise: 8-22 mins                     D. Scott Sandy Blouch PT, DPT 09/26/19, 10:24 AM

## 2019-09-26 NOTE — Progress Notes (Signed)
Nutrition Follow-up  RD working remotely.  DOCUMENTATION CODES:   Underweight  INTERVENTION:  Recommend liberalizing diet to regular.  Provide Ensure Enlive po BID, each supplement provides 350 kcal and 20 grams of protein.   Will discontinue Boost Breeze and Pro-Stat now that patient's diet has advanced.  NUTRITION DIAGNOSIS:   Increased nutrient needs related to hip fracture, post-op healing as evidenced by estimated needs.  Ongoing.  GOAL:   Patient will meet greater than or equal to 90% of their needs  Progressing.  MONITOR:   PO intake, Supplement acceptance, Diet advancement, Labs, Weight trends, Skin  REASON FOR ASSESSMENT:   Malnutrition Screening Tool    ASSESSMENT:   79 year old female who presented to the ED on 1/08 after a fall. PMH of Alzheimer's dementia. Pt found to have left hip fracture.  Attempted to call patient over the phone but she was unable to answer. Diet was advanced to heart healthy/carbohydrate modified on 1/10. PO intake is variable. Average meal completion is 55%. She appears to be drinking her oral nutrition supplements. Now that diet has advanced can order Ensure Enlive. Patient would benefit from liberalized diet. She does not have a history of DM so carbohydrate modified diet is not indicated and heart healthy restrictions are not appropriate in setting of advanced age and suspected malnutrition. Unable to confirm patient meets criteria for malnutrition without completing NFPE. According to chart patient is pending SNF placement.  Medications reviewed and include: Remeron 30 mg daily, pantoprazole.  Labs reviewed.  Diet Order:   Diet Order            Diet heart healthy/carb modified Room service appropriate? Yes; Fluid consistency: Thin  Diet effective now             EDUCATION NEEDS:   No education needs have been identified at this time  Skin:  Skin Assessment: Skin Integrity Issues: Skin Integrity Issues:: Stage I Stage  I: sacrum Incisions: closed incision left hip  Last BM:  09/25/2019 - small type 5  Height:   Ht Readings from Last 1 Encounters:  09/21/19 5' 7.5" (1.715 m)   Weight:   Wt Readings from Last 1 Encounters:  09/21/19 47.2 kg   Ideal Body Weight:  62.5 kg  BMI:  Body mass index is 16.06 kg/m.  Estimated Nutritional Needs:   Kcal:  1300-1500  Protein:  65-80 grams  Fluid:  1.3-1.5 L  Felix Pacini, MS, RD, LDN Office: [phone] Pager: [phone] After Hours/Weekend Pager: [phone]

## 2019-09-26 NOTE — Progress Notes (Signed)
PROGRESS NOTE    [name]  [name] DOB: 1940/10/03 DOA: 09/21/2019 PCP: No primary care provider on file.   Brief Narrative:  [name] is an 79 y.o. female with medical history significant for low back pain, Alzheimer's dementia, presented today hospital after a mechanical fall at [facility] facility.  X-ray shows left femoral neck fracture. s/p Hemi arthroplasty.  Subjective: No nausea no vomiting no fever no chills.  On oxygen.  No shortness of breath.  Assessment & Plan:   Active Problems:   Closed left subtrochanteric femur fracture (HCC)   Fall   Pressure injury of skin  Closed left subtrochanteric femur fracture (HCC)S/P hemiarthroplasty. -Postoperative day 3 -Pain management. -PT/OT recommends SNF -Lovenox 40 mg subcu daily x14 days after discharge -TED hose bilateral lower extremities x6 weeks  Asymptomatic bacteriuria.  Patient denies any urinary symptoms but she also has dementia. Mild leukocytosis, remained afebrile. Urine cultures - no growth, stop abx   Alzheimer's dementia and depression. -Continue home meds.  Postop acute blood loss anemia. Expected drop in the hemoglobin will transfuse 1 PRBC given hypotension.  Monitor.  Objective: Vitals:   09/26/19 0829 09/26/19 1450 09/26/19 1715 09/26/19 1743  BP: (!) 98/53 (!) 100/51 112/73 (!) 98/50  Pulse: 76 75 86 82  Resp: 16 17 18 18   Temp: 98.7 F (37.1 C) 98.4 F (36.9 C) 98.5 F (36.9 C) 99 F (37.2 C)  TempSrc: Oral Oral Oral Oral  SpO2: 98% 99% 99% 94%  Weight:      Height:        Intake/Output Summary (Last 24 hours) at 09/26/2019 1855 Last data filed at 09/26/2019 1700 Gross per 24 hour  Intake 597 ml  Output -  Net 597 ml   Filed Weights   09/21/19 0740 09/21/19 2039  Weight: 54.4 kg 47.2 kg    Examination:  General exam: Chronically ill-appearing elderly lady, appears calm and comfortable  Respiratory system: Clear to auscultation. Respiratory effort normal.  Cardiovascular system: S1 & S2 heard, RRR. No JVD, murmurs, rubs, gallops or clicks. Gastrointestinal system: Soft, nontender, nondistended, bowel sounds positive. Central nervous system: Alert and oriented. No focal neurological deficits.Symmetric 5 x 5 power. Extremities: No edema, no cyanosis, pulses intact and symmetrical. Skin: No rashes, lesions or ulcers Psychiatry: Judgement and insight appear normal. Mood & affect appropriate.    DVT prophylaxis: Lovenox  Code Status: DNR Family Communication: Patient has a legal guardian, who is agreeable for SNF Disposition Plan: TOC team working with legal guardian for placement  Consultants:   Orthopedic  Procedures:  Antimicrobials:  Keflex  Data Reviewed: I have personally reviewed following labs and imaging studies  CBC: Recent Labs  Lab 09/21/19 0802 09/22/19 0425 09/23/19 0604 09/25/19 0427 09/26/19 0626  WBC 10.2 9.7 11.5* 10.2 8.0  NEUTROABS 6.2  --  7.5  --   --   HGB 13.0 10.9* 10.6* 8.8* 7.8*  HCT 41.0 34.5* 33.5* 27.9* 25.4*  MCV 90.5 90.6 91.8 91.5 91.7  PLT 327 238 204 210 314   Basic Metabolic Panel: Recent Labs  Lab 09/22/19 0425 09/23/19 0604 09/24/19 0436 09/25/19 0427 09/26/19 0626  NA 140 137 134* 139 139  K 3.9 3.6 3.9 3.7 3.6  CL 108 104 101 106 106  CO2 24 22 23 25 27   GLUCOSE 91 96 94 100* 95  BUN 22 19 19 17 18   CREATININE 0.73 0.75 0.65 0.55 0.58  CALCIUM 8.5* 8.1* 8.2* 8.1* 8.3*   GFR:  Estimated Creatinine Clearance: 42.5 mL/min (by C-G formula based on SCr of 0.58 mg/dL). Liver Function Tests: Recent Labs  Lab 09/21/19 0802  AST 22  ALT 20  ALKPHOS 68  BILITOT 0.5  PROT 6.9  ALBUMIN 3.7     Recent Results (from the past 240 hour(s))  Respiratory Panel by RT PCR (Flu A&B, Covid) - Nasopharyngeal Swab     Status: None   Collection Time: 09/21/19 12:59 PM   Specimen: Nasopharyngeal Swab  Result Value Ref Range Status   SARS Coronavirus 2 by RT PCR NEGATIVE NEGATIVE Final     Comment: (NOTE) SARS-CoV-2 target nucleic acids are NOT DETECTED. The SARS-CoV-2 RNA is generally detectable in upper respiratoy specimens during the acute phase of infection. The lowest concentration of SARS-CoV-2 viral copies this assay can detect is 131 copies/mL. A negative result does not preclude SARS-Cov-2 infection and should not be used as the sole basis for treatment or other patient management decisions. A negative result may occur with  improper specimen collection/handling, submission of specimen other than nasopharyngeal swab, presence of viral mutation(s) within the areas targeted by this assay, and inadequate number of viral copies (<131 copies/mL). A negative result must be combined with clinical observations, patient history, and epidemiological information. The expected result is Negative. Fact Sheet for Patients:  https://www.moore.com/ Fact Sheet for Healthcare Providers:  https://www.young.biz/ This test is not yet ap proved or cleared by the [place] FDA and  has been authorized for detection and/or diagnosis of SARS-CoV-2 by FDA under an Emergency Use Authorization (EUA). This EUA will remain  in effect (meaning this test can be used) for the duration of the COVID-19 declaration under Section 564(b)(1) of the Act, 21 U.S.C. section 360bbb-3(b)(1), unless the authorization is terminated or revoked sooner.    Influenza A by PCR NEGATIVE NEGATIVE Final   Influenza B by PCR NEGATIVE NEGATIVE Final    Comment: (NOTE) The Xpert Xpress SARS-CoV-2/FLU/RSV assay is intended as an aid in  the diagnosis of influenza from Nasopharyngeal swab specimens and  should not be used as a sole basis for treatment. Nasal washings and  aspirates are unacceptable for Xpert Xpress SARS-CoV-2/FLU/RSV  testing. Fact Sheet for Patients: https://www.moore.com/ Fact Sheet for Healthcare Providers:  https://www.young.biz/ This test is not yet approved or cleared by the [place] FDA and  has been authorized for detection and/or diagnosis of SARS-CoV-2 by  FDA under an Emergency Use Authorization (EUA). This EUA will remain  in effect (meaning this test can be used) for the duration of the  Covid-19 declaration under Section 564(b)(1) of the Act, 21  U.S.C. section 360bbb-3(b)(1), unless the authorization is  terminated or revoked. Performed at [facility], [address] [place], [place] [address]   MRSA PCR Screening     Status: None   Collection Time: 09/21/19  9:12 PM   Specimen: Nasopharyngeal  Result Value Ref Range Status   MRSA by PCR NEGATIVE NEGATIVE Final    Comment:        The GeneXpert MRSA Assay (FDA approved for NASAL specimens only), is one component of a comprehensive MRSA colonization surveillance program. It is not intended to diagnose MRSA infection nor to guide or monitor treatment for MRSA infections. Performed at [facility], [address], [place], [place] [address]   Urine Culture     Status: None   Collection Time: 09/23/19  1:54 AM   Specimen: Urine, Random  Result Value Ref Range Status   Specimen  Description   Final    URINE, RANDOM Performed at [facility], [address] [place], [place] [address]    Special Requests   Final    NONE Performed at [facility], [address], [place], [place] [address]    Culture   Final    NO GROWTH Performed at [facility], [address] [place], [place] [address]    Report Status 09/24/2019 FINAL  Final     Radiology Studies: No results found.  Scheduled Meds: . aspirin  81 mg Oral Daily  . divalproex  125 mg Oral BID  . enoxaparin (LOVENOX) injection  40 mg Subcutaneous Q24H  . feeding supplement (ENSURE ENLIVE)  237 mL Oral BID BM  . mouth rinse  15 mL Mouth Rinse BID  . mirtazapine  30 mg Oral Daily  .  OLANZapine  5 mg Oral QHS  . pantoprazole  40 mg Oral Daily  . rivastigmine  6 mg Oral BID  . vortioxetine HBr  20 mg Oral Daily   Continuous Infusions:    LOS: 5 days   Time spent: 30 minutes  Lynden Oxford, MD [facility] Pager [phone]  If 7PM-7AM, please contact night-coverage [email or web address] Password [facility] 09/26/2019, 6:55 PM   This record has been created using Dragon voice recognition software. Errors have been sought and corrected,but may not always be located. Such creation errors do not reflect on the standard of care.

## 2019-09-26 NOTE — Progress Notes (Signed)
Physical Therapy Treatment Patient Details Name: [name] MRN: [id number] DOB: Dec 20, 1940 Today's Date: 09/26/2019    History of Present Illness Patient is a 79 year old female admitted after fall. She is now s/p left hemiarthroplasty. PMH incliudes; alzheimers.    PT Comments    Pt presented with deficits in strength, transfers, mobility, gait, balance, and activity tolerance.  Pt continued to report no L hip pain during the session and although lethargic at onset of session pt quickly became more alert and actively participated throughout the session.  Pt did not present with an verbalization of fear of falling this session during transfers and gait.  Pt will benefit from PT services in a SNF setting upon discharge to safely address above deficits for decreased caregiver assistance and eventual return to PLOF.     Follow Up Recommendations  SNF     Equipment Recommendations  None recommended by PT    Recommendations for Other Services       Precautions / Restrictions Precautions Precautions: Fall Restrictions Weight Bearing Restrictions: No LLE Weight Bearing: Weight bearing as tolerated    Mobility  Bed Mobility Overal bed mobility: Needs Assistance   Rolling: Mod assist   Supine to sit: Mod assist     General bed mobility comments: Mod A for BLE and trunk control  Transfers Overall transfer level: Needs assistance Equipment used: Rolling walker (2 wheeled) Transfers: Sit to/from Stand Sit to Stand: Min assist Stand pivot transfers: Total assist       General transfer comment: Mod verbal and tactile cues for hand placement and increased trunk flexion  Ambulation/Gait Ambulation/Gait assistance: Min assist Gait Distance (Feet): 30 Feet Assistive device: Rolling walker (2 wheeled) Gait Pattern/deviations: Step-through pattern;Decreased step length - right;Decreased stance time - left;Antalgic Gait velocity: decreased   General Gait Details: Slow,  cautious gait with min A for stability and to guide the RW   Stairs             Wheelchair Mobility    Modified Rankin (Stroke Patients Only)       Balance Overall balance assessment: Needs assistance Sitting-balance support: Bilateral upper extremity supported Sitting balance-Leahy Scale: Fair     Standing balance support: Bilateral upper extremity supported;During functional activity Standing balance-Leahy Scale: Poor Standing balance comment: Min A for stability in standing                            Cognition Arousal/Alertness: Awake/alert Behavior During Therapy: Flat affect Overall Cognitive Status: No family/caregiver present to determine baseline cognitive functioning                                        Exercises Total Joint Exercises Ankle Circles/Pumps: AAROM;Both;10 reps;15 reps Towel Squeeze: Both;10 reps;Strengthening Short Arc Quad: AROM;Both;10 reps Hip ABduction/ADduction: AAROM;Both;10 reps Straight Leg Raises: AAROM;Both;10 reps Long Arc Quad: Both;10 reps;Strengthening;AROM;15 reps Knee Flexion: Both;10 reps;Strengthening;AROM;15 reps    General Comments        Pertinent Vitals/Pain Pain Assessment: No/denies pain    Home Living                      Prior Function            PT Goals (current goals can now be found in the care plan section) Progress towards PT goals: Progressing toward goals  Frequency    BID      PT Plan Current plan remains appropriate    Co-evaluation              AM-PAC PT "6 Clicks" Mobility   Outcome Measure  Help needed turning from your back to your side while in a flat bed without using bedrails?: A Lot Help needed moving from lying on your back to sitting on the side of a flat bed without using bedrails?: A Lot Help needed moving to and from a bed to a chair (including a wheelchair)?: A Little Help needed standing up from a chair using your arms  (e.g., wheelchair or bedside chair)?: A Little Help needed to walk in hospital room?: A Little Help needed climbing 3-5 steps with a railing? : A Lot 6 Click Score: 15    End of Session Equipment Utilized During Treatment: Gait belt;Oxygen Activity Tolerance: Patient tolerated treatment well Patient left: in chair;with call bell/phone within reach;with chair alarm set;with SCD's reapplied Nurse Communication: Mobility status PT Visit Diagnosis: Muscle weakness (generalized) (M62.81);History of falling (Z91.81);Other abnormalities of gait and mobility (R26.89);Difficulty in walking, not elsewhere classified (R26.2);Pain;Unsteadiness on feet (R26.81) Pain - Right/Left: Left Pain - part of body: Hip     Time: 1345-1410 PT Time Calculation (min) (ACUTE ONLY): 25 min  Charges:  $Gait Training: 8-22 mins $Therapeutic Exercise: 8-22 mins                     D. Scott Daleisa Halperin PT, DPT 09/26/19, 3:45 PM

## 2019-09-26 NOTE — Progress Notes (Signed)
This Clinical research associate called and spoke with patients legal guardian she states she was unaware , educated that patients can sometimes have HGB go down after surgery , Mrs. Mayford Knife states she will call this writer back as she needed to " Speak with her management team"'

## 2019-09-26 NOTE — Progress Notes (Signed)
Bladder scan patient 230cc of urine in bladder, remains on NS at 100cc/hour

## 2019-09-26 NOTE — Progress Notes (Signed)
Subjective: 4 Days Post-Op Procedure(s) (LRB): ARTHROPLASTY BIPOLAR HIP (HEMIARTHROPLASTY) RIGHT (Left) Patient reports pain as mild, denies any significant hip pain this morning. Patient is well, and has had no acute complaints or problems Denies any CP, SOB, ABD pain. We will continue therapy today.  Plan is to go Skilled nursing facility after hospital stay. Being treated for UTI but denies any symptoms this morning.  Objective: Vital signs in last 24 hours: Temp:  [97.6 F (36.4 C)-99.1 F (37.3 C)] 98.3 F (36.8 C) (01/13 0557) Pulse Rate:  [75-93] 79 (01/13 0557) Resp:  [14-20] 14 (01/13 0557) BP: (89-107)/(36-58) 89/51 (01/13 0557) SpO2:  [91 %-98 %] 97 % (01/13 0557)  Intake/Output from previous day: 01/12 0701 - 01/13 0700 In: 957 [P.O.:957] Out: 800 [Urine:800] Intake/Output this shift: No intake/output data recorded.  Recent Labs    09/25/19 0427 09/26/19 0626  HGB 8.8* 7.8*   Recent Labs    09/25/19 0427 09/26/19 0626  WBC 10.2 8.0  RBC 3.05* 2.77*  HCT 27.9* 25.4*  PLT 210 259   Recent Labs    09/25/19 0427 09/26/19 0626  NA 139 139  K 3.7 3.6  CL 106 106  CO2 25 27  BUN 17 18  CREATININE 0.55 0.58  GLUCOSE 100* 95  CALCIUM 8.1* 8.3*   No results for input(s): LABPT, INR in the last 72 hours.  EXAM General - Patient is drowsy but answers all questions this AM. Extremity - Neurovascular intact Sensation intact distally Intact pulses distally Dorsiflexion/Plantar flexion intact No cellulitis present Compartment soft Dressing - dressing C/D/I and no drainage Motor Function - intact, moving foot and toes well on exam.   Past Medical History:  Diagnosis Date  . Alzheimer's dementia (HCC)   . Insomnia   . Low back pain   . Vomiting     Assessment/Plan:   4 Days Post-Op Procedure(s) (LRB): ARTHROPLASTY BIPOLAR HIP (HEMIARTHROPLASTY) RIGHT (Left) Active Problems:   Closed left subtrochanteric femur fracture (HCC)   Fall  Pressure injury of skin  Estimated body mass index is 16.06 kg/m as calculated from the following:   Height as of this encounter: 5' 7.5" (1.715 m).   Weight as of this encounter: 47.2 kg. Advance diet Up with therapy   No recent Temp. Patient has had several bowel movements. Denies any pain this morning. Continue with PT today, plan for d/c to SNF when able.  Follow-up with [facility] orthopedics in 2 weeks for staple removal and Steri-Strip application Staples can be removed on 10/03/19 Lovenox 40 mg subcu daily x14 days after discharge TED hose bilateral lower extremities x6 weeks  DVT Prophylaxis - Lovenox, TED hose and SCDs Weight-Bearing as tolerated to left leg  J. Horris Latino, PA-C [facility] 09/26/2019, 7:44 AM

## 2019-09-26 NOTE — TOC Progression Note (Signed)
Transition of Care [facility]) - Progression Note    Patient Details  Name: [name] MRN: [id number] Date of Birth: 1940-10-05  Transition of Care [facility]) CM/SW Contact  Barrie Dunker, RN Phone Number: 09/26/2019, 10:31 AM  Clinical Narrative:    Lauro Franklin the APS legal Guardian at [phone], left a secure detailed message with the bed choices and requested a call back ASAP with a choice to get the DC underway.  I left my contact information for the call back.  Attempted to also reach Tildon Husky at APS without success. Expected Discharge Plan: Skilled Nursing Facility Barriers to Discharge: SNF Pending bed offer  Expected Discharge Plan and Services Expected Discharge Plan: Skilled Nursing Facility     Post Acute Care Choice: Skilled Nursing Facility Living arrangements for the past 2 months: Assisted Living Facility                                       Social Determinants of Health (SDOH) Interventions    Readmission Risk Interventions No flowsheet data found.

## 2019-09-26 NOTE — TOC Progression Note (Signed)
Transition of Care [facility]) - Progression Note    Patient Details  Name: [name] MRN: [id number] Date of Birth: [birth year]  Transition of Care [facility]) CM/SW Contact  Barrie Dunker, RN Phone Number: 09/26/2019, 10:38 AM  Clinical Narrative:    Spoke with Rykiel Turmer with APS, we reviewed the bed offers and she chose [facility] for the patient, I notified Tina with [facility]    Expected Discharge Plan: Skilled Nursing Facility Barriers to Discharge: SNF Pending bed offer  Expected Discharge Plan and Services Expected Discharge Plan: Skilled Nursing Facility     Post Acute Care Choice: Skilled Nursing Facility Living arrangements for the past 2 months: Assisted Living Facility                                       Social Determinants of Health (SDOH) Interventions    Readmission Risk Interventions No flowsheet data found.

## 2019-09-27 LAB — CBC WITH DIFFERENTIAL/PLATELET
Abs Immature Granulocytes: 0.02 10*3/uL (ref 0.00–0.07)
Basophils Absolute: 0 10*3/uL (ref 0.0–0.1)
Basophils Relative: 0 %
Eosinophils Absolute: 0.4 10*3/uL (ref 0.0–0.5)
Eosinophils Relative: 5 %
HCT: 29.5 % — ABNORMAL LOW (ref 36.0–46.0)
Hemoglobin: 9.6 g/dL — ABNORMAL LOW (ref 12.0–15.0)
Immature Granulocytes: 0 %
Lymphocytes Relative: 34 %
Lymphs Abs: 2.7 10*3/uL (ref 0.7–4.0)
MCH: 28.7 pg (ref 26.0–34.0)
MCHC: 32.5 g/dL (ref 30.0–36.0)
MCV: 88.3 fL (ref 80.0–100.0)
Monocytes Absolute: 1.1 10*3/uL — ABNORMAL HIGH (ref 0.1–1.0)
Monocytes Relative: 13 %
Neutro Abs: 3.9 10*3/uL (ref 1.7–7.7)
Neutrophils Relative %: 48 %
Platelets: 271 10*3/uL (ref 150–400)
RBC: 3.34 MIL/uL — ABNORMAL LOW (ref 3.87–5.11)
RDW: 14.7 % (ref 11.5–15.5)
WBC: 8 10*3/uL (ref 4.0–10.5)
nRBC: 0 % (ref 0.0–0.2)

## 2019-09-27 LAB — COMPREHENSIVE METABOLIC PANEL
ALT: 17 U/L (ref 0–44)
AST: 29 U/L (ref 15–41)
Albumin: 2 g/dL — ABNORMAL LOW (ref 3.5–5.0)
Alkaline Phosphatase: 43 U/L (ref 38–126)
Anion gap: 6 (ref 5–15)
BUN: 18 mg/dL (ref 8–23)
CO2: 26 mmol/L (ref 22–32)
Calcium: 8.4 mg/dL — ABNORMAL LOW (ref 8.9–10.3)
Chloride: 107 mmol/L (ref 98–111)
Creatinine, Ser: 0.54 mg/dL (ref 0.44–1.00)
GFR calc Af Amer: >60 mL/min (ref >60)
GFR calc non Af Amer: >60 mL/min (ref >60)
Glucose, Bld: 95 mg/dL (ref 70–99)
Potassium: 3.3 mmol/L — ABNORMAL LOW (ref 3.5–5.1)
Sodium: 139 mmol/L (ref 135–145)
Total Bilirubin: 0.6 mg/dL (ref 0.3–1.2)
Total Protein: 4.9 g/dL — ABNORMAL LOW (ref 6.5–8.1)

## 2019-09-27 LAB — TYPE AND SCREEN
ABO/RH(D): O POS
Antibody Screen: NEGATIVE
Unit division: 0

## 2019-09-27 LAB — SARS CORONAVIRUS 2 (TAT 6-24 HRS): SARS Coronavirus 2: NEGATIVE

## 2019-09-27 LAB — BPAM RBC
Blood Product Expiration Date: 202102102359
ISSUE DATE / TIME: 202101131725
Unit Type and Rh: 5100

## 2019-09-27 LAB — MAGNESIUM: Magnesium: 1.4 mg/dL — ABNORMAL LOW (ref 1.7–2.4)

## 2019-09-27 MED ORDER — POTASSIUM CHLORIDE CRYS ER 20 MEQ PO TBCR
40.0000 meq | EXTENDED_RELEASE_TABLET | Freq: Once | ORAL | Status: AC
  Administered 2019-09-27: 40 meq via ORAL
  Filled 2019-09-27: qty 2

## 2019-09-27 MED ORDER — MAGNESIUM SULFATE 2 GM/50ML IV SOLN
2.0000 g | Freq: Once | INTRAVENOUS | Status: AC
  Administered 2019-09-27: 2 g via INTRAVENOUS
  Filled 2019-09-27: qty 50

## 2019-09-27 MED ORDER — ENSURE ENLIVE PO LIQD: 237.0000 mL | Freq: Two times a day (BID) | ORAL | 12 refills | Status: AC

## 2019-09-27 NOTE — Discharge Summary (Signed)
[facility] Discharge Summary   Patient: [name] AVW:[id number]   PCP: No primary care provider on file. DOB: 05/09/1941   Date of admission: 09/21/2019   Date of discharge:  09/27/2019    Discharge Diagnoses:  Principal diagnosis   Closed left subtrochanteric femur fracture [name](HCC)  Active Problems:   Closed left subtrochanteric femur fracture (HCC)   Fall   Pressure injury of skin   Admitted From: home Disposition:  SNF   Recommendations for Outpatient Follow-up:  1. PCP: Please follow up with PCP in 1 week 2. Follow up LABS/TEST:  none   Contact information for follow-up providers    PCP. Schedule an appointment as soon as possible for a visit in 1 week(s).        [name], [name], MD. Schedule an appointment as soon as possible for a visit in 1 month(s).   Specialty: Orthopedic Surgery Contact information: [address] [name] [place] [id number] [phone]            Contact information for after-discharge care    Destination    [name] Preferred SNF .   Service: Skilled Nursing Contact information: [address] [place] [id number] [phone]                 Diet recommendation: Cardiac diet  Activity: The patient is advised to gradually reintroduce usual activities,as tolerated  Discharge Condition: good  Code Status: DNR   History of present illness: As per the H and P dictated on admission, "Amber Quillvery W Glennie is an 79 y.o. female with medical history significant for low back pain, Alzheimer's dementia, presented today [name].  She said she was wearing sneakers socks without any shoes on when she fell.  She said she was putting her laundry away when she slipped and fell.  She developed excruciating, sharp pain in the left hip after the fall.  There were no known relieving factors for her pain and pain was worse with movement.  It is  nonradiating.  She did not have any other symptoms prior to or after the fall.  No chest pain, shortness of breath, palpitations, dizziness, syncope, unilateral weakness or numbness in the extremities."  [name] Course:  Summary of her active problems in the [name] is as following. Closed left subtrochanteric femur fracture (HCC)S/P hemiarthroplasty. -Postoperative day 4 -Pain management per ortho -PT/OT recommends SNF -Lovenox 40 mg subcu daily x14 days after discharge -TED hose bilateral lower extremities x6 weeks  Asymptomatic bacteriuria.  Patient denies any urinary symptoms but she also has dementia. Mild leukocytosis, remained afebrile. Urine cultures - no growth, stop abx   Alzheimer's dementia and depression. -Continue home meds.  Postop acute blood loss anemia. Expected drop in the hemoglobin  transfuse 1 PRBC given hypotension. Appropriately elevated, no bleeding.   Acute urinary retention  Insert foley catheter Maintain for 1 week Voiding trial after that  Underweight  Body mass index is 16.06 kg/m.  Nutrition Problem: Increased nutrient needs Etiology: hip fracture, post-op healing Nutrition Interventions: Interventions: Boost Breeze, Prostat, Refer to RD note for recommendations   Pressure injury  Stage 1 sacrum medial. POA  Pressure Injury 09/25/19 Sacrum Mid;Left Stage 1 -  Intact skin with non-blanchable redness of a localized area usually over a bony prominence. non-blanchable redness to sacrum (Active)  09/25/19 0000  Location: Sacrum  Location Orientation: Mid;Left  Staging: Stage 1 -  Intact skin with  non-blanchable redness of a localized area usually over a bony prominence.  Wound Description (Comments): non-blanchable redness to sacrum  Present on Admission:    Pain control  - [name] Controlled Substance Reporting System database was reviewed. - 5 day supply was provided. - Patient was instructed, not to drive, operate heavy  machinery, perform activities at heights, swimming or participation in water activities or provide baby sitting services while on Pain, Sleep and Anxiety Medications; until her outpatient Physician has advised to do so again.  - Also recommended to not to take more than prescribed Pain, Sleep and Anxiety Medications.  Patient was seen by physical therapy, who recommended SNF, which was arranged. On the day of the discharge the patient's vitals were stable, and no other acute medical condition were reported by patient. the patient was felt safe to be discharge at SNF with SNF.  Consultants: Orthopedics  Procedures: Left hip hemiasthroplasty   DISCHARGE MEDICATION: Allergies as of 09/27/2019   No Known Allergies     Medication List    TAKE these medications   acetaminophen 500 MG tablet Commonly known as: TYLENOL Take 500 mg by mouth every 4 (four) hours as needed for mild pain or fever.   aspirin 81 MG chewable tablet Chew by mouth daily.   cholecalciferol 25 MCG (1000 UNIT) tablet Commonly known as: VITAMIN D3 Take 1,000 Units by mouth daily.   divalproex 125 MG DR tablet Commonly known as: DEPAKOTE Take 125 mg by mouth 2 (two) times daily.   enoxaparin 40 MG/0.4ML injection Commonly known as: LOVENOX Inject 0.4 mLs (40 mg total) into the skin daily.   eszopiclone 2 MG Tabs tablet Commonly known as: LUNESTA Take 2 mg by mouth at bedtime. Take immediately before bedtime   feeding supplement (ENSURE ENLIVE) Liqd Take 237 mLs by mouth 2 (two) times daily between meals.   guaifenesin 100 MG/5ML syrup Commonly known as: ROBITUSSIN Take 200 mg by mouth every 6 (six) hours as needed for cough.   HYDROcodone-acetaminophen 5-325 MG tablet Commonly known as: NORCO/VICODIN Take 1-2 tablets by mouth every 4 (four) hours as needed for moderate pain (pain score 4-6).   lactulose (encephalopathy) 10 GM/15ML Soln Commonly known as: CHRONULAC Take 30 g by mouth daily.     loperamide 2 MG capsule Commonly known as: IMODIUM Take 2 mg by mouth every 3 (three) hours as needed for diarrhea or loose stools.   magnesium hydroxide 400 MG/5ML suspension Commonly known as: MILK OF MAGNESIA Take 30 mLs by mouth at bedtime as needed for mild constipation or moderate constipation.   Menthol-Zinc Oxide 0.44-20.625 % Oint Apply 1 application topically 4 (four) times daily as needed (skin irritation).   Mintox 200-200-20 MG/5ML suspension Generic drug: alum & mag hydroxide-simeth Take 30 mLs by mouth 4 (four) times daily as needed for indigestion or heartburn.   mirtazapine 30 MG tablet Commonly known as: REMERON Take 30 mg by mouth daily.   neomycin-bacitracin-polymyxin ointment Commonly known as: NEOSPORIN Apply 1 application topically 4 (four) times daily as needed for wound care.   OLANZapine 5 MG tablet Commonly known as: ZYPREXA Take 5 mg by mouth at bedtime.   omeprazole 20 MG capsule Commonly known as: PRILOSEC Take 20 mg by mouth daily before breakfast.   ondansetron 4 MG disintegrating tablet Commonly known as: ZOFRAN-ODT Take 4 mg by mouth every 6 (six) hours as needed for nausea or vomiting.   polyethylene glycol 17 g packet Commonly known as: MIRALAX / GLYCOLAX Take  17 g by mouth at bedtime.   rivastigmine 6 MG capsule Commonly known as: EXELON Take 6 mg by mouth 2 (two) times daily.   senna-docusate 8.6-50 MG tablet Commonly known as: Senokot-S Take 2 tablets by mouth 2 (two) times daily.   Trintellix 20 MG Tabs tablet Generic drug: vortioxetine HBr Take 20 mg by mouth daily.      No Known Allergies Discharge Instructions    Diet - low sodium heart healthy   Complete by: As directed    Increase activity slowly   Complete by: As directed      Discharge Exam: Filed Weights   09/21/19 0740 09/21/19 2039  Weight: 54.4 kg 47.2 kg   Vitals:   09/27/19 0752 09/27/19 0753  BP: (!) 113/41   Pulse: 70 70  Resp: 16   Temp:  97.9 F (36.6 C)   SpO2: 90% 93%   General: Appear in no distress, no Rash; Oral Mucosa Clear, moist. no Abnormal Mass Or lumps Cardiovascular: S1 and S2 Present, no Murmur, Respiratory: normal respiratory effort, Bilateral Air entry present and Clear to Auscultation, no Crackles, no wheezes Abdomen: Bowel Sound present, Soft and no tenderness, no hernia Extremities: no Pedal edema, no calf tenderness Neurology: alert and oriented to time, place, and person affect appropriate.  The results of significant diagnostics from this hospitalization (including imaging, microbiology, ancillary and laboratory) are listed below for reference.    Significant Diagnostic Studies: DG Chest 1 View  Result Date: 09/21/2019 CLINICAL DATA:  Fall, pain EXAM: CHEST  1 VIEW COMPARISON:  10/22/2012 FINDINGS: Heart is normal size. There is hyperinflation of the lungs compatible with COPD. Aortic atherosclerosis. No confluent opacities or effusions. No acute bony abnormality. No pneumothorax. IMPRESSION: COPD.  No active disease. Electronically Signed   By: Charlett NoseKevin  Dover M.D.   On: 09/21/2019 12:05   DG Pelvis 1-2 Views  Result Date: 09/21/2019 CLINICAL DATA:  Left hip pain after fall EXAM: LEFT FEMUR 2 VIEWS; PELVIS - 1-2 VIEW COMPARISON:  None. FINDINGS: Acute subcapital fracture of the proximal left femur with mild foreshortening. Alignment of the left hip joint is maintained without dislocation. Pelvic bony ring appears intact. SI joints and pubic symphysis are intact without diastasis. There is chondrocalcinosis at the pubic symphysis. Bones are diffusely demineralized. The more distal aspect of the femur is intact with anatomic alignment at the knee joint. No knee joint effusion. There are extensive vascular calcifications. IMPRESSION: Acute subcapital fracture of the proximal left femur with mild foreshortening. Electronically Signed   By: Duanne GuessNicholas  Plundo D.O.   On: 09/21/2019 12:09   DG HIP UNILAT WITH PELVIS  2-3 VIEWS LEFT  Result Date: 09/22/2019 CLINICAL DATA:  Postoperative after left hip hemiarthroplasty. EXAM: DG HIP (WITH OR WITHOUT PELVIS) 2-3V LEFT COMPARISON:  Femur and pelvis radiographs dated 09/21/2019 FINDINGS: The patient is status post a left hip arthroplasty. The hardware is intact and well aligned. There is no joint dislocation. IMPRESSION: Status post left hip arthroplasty without evidence for hardware complication. Electronically Signed   By: Romona Curlsyler  Litton M.D.   On: 09/22/2019 15:09   DG Femur Min 2 Views Left  Result Date: 09/21/2019 CLINICAL DATA:  Left hip pain after fall EXAM: LEFT FEMUR 2 VIEWS; PELVIS - 1-2 VIEW COMPARISON:  None. FINDINGS: Acute subcapital fracture of the proximal left femur with mild foreshortening. Alignment of the left hip joint is maintained without dislocation. Pelvic bony ring appears intact. SI joints and pubic symphysis are intact without diastasis. There  is chondrocalcinosis at the pubic symphysis. Bones are diffusely demineralized. The more distal aspect of the femur is intact with anatomic alignment at the knee joint. No knee joint effusion. There are extensive vascular calcifications. IMPRESSION: Acute subcapital fracture of the proximal left femur with mild foreshortening. Electronically Signed   By: Davina Poke D.O.   On: 09/21/2019 12:09    Microbiology: Recent Results (from the past 240 hour(s))  Respiratory Panel by RT PCR (Flu A&B, Covid) - Nasopharyngeal Swab     Status: None   Collection Time: 09/21/19 12:59 PM   Specimen: Nasopharyngeal Swab  Result Value Ref Range Status   SARS Coronavirus 2 by RT PCR NEGATIVE NEGATIVE Final    Comment: (NOTE) SARS-CoV-2 target nucleic acids are NOT DETECTED. The SARS-CoV-2 RNA is generally detectable in upper respiratoy specimens during the acute phase of infection. The lowest concentration of SARS-CoV-2 viral copies this assay can detect is 131 copies/mL. A negative result does not preclude  SARS-Cov-2 infection and should not be used as the sole basis for treatment or other patient management decisions. A negative result may occur with  improper specimen collection/handling, submission of specimen other than nasopharyngeal swab, presence of viral mutation(s) within the areas targeted by this assay, and inadequate number of viral copies (<131 copies/mL). A negative result must be combined with clinical observations, patient history, and epidemiological information. The expected result is Negative. Fact Sheet for Patients:  PinkCheek.be Fact Sheet for Healthcare Providers:  GravelBags.it This test is not yet ap proved or cleared by the [place] FDA and  has been authorized for detection and/or diagnosis of SARS-CoV-2 by FDA under an Emergency Use Authorization (EUA). This EUA will remain  in effect (meaning this test can be used) for the duration of the COVID-19 declaration under Section 564(b)(1) of the Act, 21 U.S.C. section 360bbb-3(b)(1), unless the authorization is terminated or revoked sooner.    Influenza A by PCR NEGATIVE NEGATIVE Final   Influenza B by PCR NEGATIVE NEGATIVE Final    Comment: (NOTE) The Xpert Xpress SARS-CoV-2/FLU/RSV assay is intended as an aid in  the diagnosis of influenza from Nasopharyngeal swab specimens and  should not be used as a sole basis for treatment. Nasal washings and  aspirates are unacceptable for Xpert Xpress SARS-CoV-2/FLU/RSV  testing. Fact Sheet for Patients: PinkCheek.be Fact Sheet for Healthcare Providers: GravelBags.it This test is not yet approved or cleared by the [place] FDA and  has been authorized for detection and/or diagnosis of SARS-CoV-2 by  FDA under an Emergency Use Authorization (EUA). This EUA will remain  in effect (meaning this test can be used) for the duration of the  Covid-19  declaration under Section 564(b)(1) of the Act, 21  U.S.C. section 360bbb-3(b)(1), unless the authorization is  terminated or revoked. Performed at [name] [address] [place], [place] [address]   MRSA PCR Screening     Status: None   Collection Time: 09/21/19  9:12 PM   Specimen: Nasopharyngeal  Result Value Ref Range Status   MRSA by PCR NEGATIVE NEGATIVE Final    Comment:        The GeneXpert MRSA Assay (FDA approved for NASAL specimens only), is one component of a comprehensive MRSA colonization surveillance program. It is not intended to diagnose MRSA infection nor to guide or monitor treatment for MRSA infections. Performed at [name], [address], [place], [place] [address]   Urine Culture     Status: None  Collection Time: 09/23/19  1:54 AM   Specimen: Urine, Random  Result Value Ref Range Status   Specimen Description   Final    URINE, RANDOM Performed at [facility], [address], [place], [place] [address]    Special Requests   Final    NONE Performed at [facility], [address] [place], [place] [address]    Culture   Final    NO GROWTH Performed at [facility], [address] [place], [place] [address]    Report Status 09/24/2019 FINAL  Final  SARS CORONAVIRUS 2 (TAT 6-24 HRS) Nasopharyngeal Nasopharyngeal Swab     Status: None   Collection Time: 09/26/19  1:33 PM   Specimen: Nasopharyngeal Swab  Result Value Ref Range Status   SARS Coronavirus 2 NEGATIVE NEGATIVE Final    Comment: (NOTE) SARS-CoV-2 target nucleic acids are NOT DETECTED. The SARS-CoV-2 RNA is generally detectable in upper and lower respiratory specimens during the acute phase of infection. Negative results do not preclude SARS-CoV-2 infection, do not rule out co-infections with other pathogens, and should not be used as the sole basis for treatment or other patient management decisions. Negative results must be  combined with clinical observations, patient history, and epidemiological information. The expected result is Negative. Fact Sheet for Patients: HairSlick.no Fact Sheet for Healthcare Providers: quierodirigir.com This test is not yet approved or cleared by the [place] FDA and  has been authorized for detection and/or diagnosis of SARS-CoV-2 by FDA under an Emergency Use Authorization (EUA). This EUA will remain  in effect (meaning this test can be used) for the duration of the COVID-19 declaration under Section 56 4(b)(1) of the Act, 21 U.S.C. section 360bbb-3(b)(1), unless the authorization is terminated or revoked sooner. Performed at [name], [address], [place], [place] [address]      Labs: CBC: Recent Labs  Lab 09/21/19 0802 09/21/19 0802 09/22/19 0425 09/23/19 0604 09/25/19 0427 09/26/19 0626 09/27/19 0325  WBC 10.2   < > 9.7 11.5* 10.2 8.0 8.0  NEUTROABS 6.2  --   --  7.5  --   --  3.9  HGB 13.0   < > 10.9* 10.6* 8.8* 7.8* 9.6*  HCT 41.0   < > 34.5* 33.5* 27.9* 25.4* 29.5*  MCV 90.5   < > 90.6 91.8 91.5 91.7 88.3  PLT 327   < > 238 204 210 259 271   < > = values in this interval not displayed.   Basic Metabolic Panel: Recent Labs  Lab 09/23/19 0604 09/24/19 0436 09/25/19 0427 09/26/19 0626 09/27/19 0325  NA 137 134* 139 139 139  K 3.6 3.9 3.7 3.6 3.3*  CL 104 101 106 106 107  CO2 22 23 25 27 26   GLUCOSE 96 94 100* 95 95  BUN 19 19 17 18 18   CREATININE 0.75 0.65 0.55 0.58 0.54  CALCIUM 8.1* 8.2* 8.1* 8.3* 8.4*  MG  --   --   --   --  1.4*   Liver Function Tests: Recent Labs  Lab 09/21/19 0802 09/27/19 0325  AST 22 29  ALT 20 17  ALKPHOS 68 43  BILITOT 0.5 0.6  PROT 6.9 4.9*  ALBUMIN 3.7 2.0*   No results for input(s): LIPASE, AMYLASE in the last 168 hours. No results for input(s): AMMONIA in the last 168 hours. Cardiac Enzymes: No results for input(s): CKTOTAL, CKMB,  CKMBINDEX, TROPONINI in the last 168 hours. BNP (last 3 results)  No results for input(s): BNP in the last 8760 hours. CBG: No results for input(s): GLUCAP in the last 168 hours.  Time spent: 35 minutes  Signed:  Lynden Oxford  [facility]  09/27/2019 9:42 AM

## 2019-09-27 NOTE — Progress Notes (Signed)
Physical Therapy Treatment Patient Details Name: [name] MRN: [id number] DOB: 11/14/1940 Today's Date: 09/27/2019    History of Present Illness Patient is a 79 year old female admitted after fall. She is now s/p left hemiarthroplasty. PMH incliudes; alzheimers.    PT Comments    Pt presented with deficits in strength, transfers, mobility, gait, balance, and activity tolerance.  Pt found at beginning of session with O2 cannula doffed with SpO2 84-86% on room air.  Cannula donned with SpO2 quickly increasing back to the low to mid 90s but pt required frequent verbal cues during the session to prevent her from doffing cannula, nursing notified.  Pt c/o fear of falling frequently during the session with frequent encouragement provided.  Pt continued to require physical assistance for functional mobility and to prevent LOB during amb.  Pt will benefit from PT services in a SNF setting upon discharge to safely address above deficits for decreased caregiver assistance and eventual return to PLOF.     Follow Up Recommendations  SNF     Equipment Recommendations  None recommended by PT    Recommendations for Other Services       Precautions / Restrictions Precautions Precautions: Fall Restrictions Weight Bearing Restrictions: Yes LLE Weight Bearing: Weight bearing as tolerated    Mobility  Bed Mobility Overal bed mobility: Needs Assistance Bed Mobility: Rolling;Supine to Sit Rolling: Mod assist   Supine to sit: Mod assist     General bed mobility comments: Mod A for BLE and trunk control  Transfers Overall transfer level: Needs assistance Equipment used: Rolling walker (2 wheeled) Transfers: Sit to/from Stand Sit to Stand: Min assist         General transfer comment: Mod verbal and tactile cues for hand placement and increased trunk flexion  Ambulation/Gait Ambulation/Gait assistance: Min assist Gait Distance (Feet): 40 Feet Assistive device: Rolling walker (2  wheeled) Gait Pattern/deviations: Step-through pattern;Decreased step length - right;Decreased stance time - left;Antalgic Gait velocity: decreased   General Gait Details: Slow, cautious gait with min A for stability and to guide the RW   Stairs             Wheelchair Mobility    Modified Rankin (Stroke Patients Only)       Balance Overall balance assessment: Needs assistance Sitting-balance support: Bilateral upper extremity supported Sitting balance-Leahy Scale: Fair     Standing balance support: Bilateral upper extremity supported;During functional activity Standing balance-Leahy Scale: Poor Standing balance comment: Min A for stability in standing                            Cognition Arousal/Alertness: Awake/alert Behavior During Therapy: Flat affect Overall Cognitive Status: No family/caregiver present to determine baseline cognitive functioning                                        Exercises Total Joint Exercises Ankle Circles/Pumps: AAROM;Both;10 reps;15 reps Towel Squeeze: Both;10 reps;Strengthening Short Arc Quad: AROM;Both;10 reps;AAROM Heel Slides: AAROM;Both;10 reps;5 reps Hip ABduction/ADduction: AAROM;Both;10 reps;5 reps Straight Leg Raises: AAROM;Both;10 reps;5 reps Long Arc Quad: Both;10 reps;Strengthening;AROM;15 reps Knee Flexion: Both;10 reps;Strengthening;AROM;15 reps Other Exercises Other Exercises: Multiple sit to/from stand transfer training from various height surfaces    General Comments        Pertinent Vitals/Pain Pain Assessment: No/denies pain    Home Living  Prior Function            PT Goals (current goals can now be found in the care plan section) Progress towards PT goals: Progressing toward goals    Frequency    BID      PT Plan Current plan remains appropriate    Co-evaluation              AM-PAC PT "6 Clicks" Mobility   Outcome Measure   Help needed turning from your back to your side while in a flat bed without using bedrails?: A Lot Help needed moving from lying on your back to sitting on the side of a flat bed without using bedrails?: A Lot Help needed moving to and from a bed to a chair (including a wheelchair)?: A Little Help needed standing up from a chair using your arms (e.g., wheelchair or bedside chair)?: A Little Help needed to walk in hospital room?: A Little Help needed climbing 3-5 steps with a railing? : A Lot 6 Click Score: 15    End of Session Equipment Utilized During Treatment: Gait belt;Oxygen Activity Tolerance: Patient tolerated treatment well Patient left: in chair;with call bell/phone within reach;with chair alarm set;with SCD's reapplied Nurse Communication: Mobility status;Other (comment)(Pt actively doffing nasal cannula with O2 in the mid 80s on room air) PT Visit Diagnosis: Muscle weakness (generalized) (M62.81);History of falling (Z91.81);Other abnormalities of gait and mobility (R26.89);Difficulty in walking, not elsewhere classified (R26.2);Pain;Unsteadiness on feet (R26.81) Pain - Right/Left: Left Pain - part of body: Hip     Time: 8250-5397 PT Time Calculation (min) (ACUTE ONLY): 39 min  Charges:  $Gait Training: 8-22 mins $Therapeutic Exercise: 8-22 mins $Therapeutic Activity: 8-22 mins                     D. Scott Filomena Pokorney PT, DPT 09/27/19, 10:50 AM

## 2019-09-27 NOTE — TOC Transition Note (Signed)
Transition of Care [facility]) - CM/SW Discharge Note   Patient Details  Name: [name] MRN: [id number] Date of Birth: 10-11-1940  Transition of Care [facility]) CM/SW Contact:  Barrie Dunker, RN Phone Number: 09/27/2019, 9:56 AM   Clinical Narrative:    [facility] to [facility] to [facility] today via EMS transport, The bedside nurse to call report to [facility] and call EMS when ready to transport.  Legal Guardian has been made aware   Final next level of care: Skilled Nursing Facility Barriers to Discharge: Barriers Resolved   Patient Goals and CMS Choice        Discharge Placement              Patient chooses bed at: [facility] [place] Patient to be transferred to facility by: EMS Name of family member notified: Lorita Officer Legal Guardian Patient and family notified of of transfer: 09/27/19  Discharge Plan and Services     Post Acute Care Choice: Skilled Nursing Facility                               Social Determinants of Health (SDOH) Interventions     Readmission Risk Interventions No flowsheet data found.

## 2019-09-27 NOTE — Progress Notes (Signed)
Unable to void all shift, bladder scanned for 475, denies urge to void. Poor p.o. intake and no IVF order. NP Steward Drone notified I&O order place. Pt I&O cath for 500.

## 2019-09-27 NOTE — Care Management Important Message (Signed)
Important Message  Patient Details  Name: [name] MRN: [id number] Date of Birth: [birth year]   Medicare Important Message Given:  Yes     Olegario Messier A [place] Warriner 09/27/2019, 10:49 AM

## 2019-09-27 NOTE — TOC Progression Note (Signed)
Transition of Care [facility]) - Progression Note    Patient Details  Name: [name] MRN: [id number] Date of Birth: [birth year]  Transition of Care [facility]) CM/SW Contact  Barrie Dunker, RN Phone Number: 09/27/2019, 10:50 AM  Clinical Narrative:    PASSR reviewer called and asked about the patient's depression, she is working on the review and will complete today   Expected Discharge Plan: Skilled Nursing Facility Barriers to Discharge: Barriers Resolved  Expected Discharge Plan and Services Expected Discharge Plan: Skilled Nursing Facility     Post Acute Care Choice: Skilled Nursing Facility Living arrangements for the past 2 months: Assisted Living Facility Expected Discharge Date: 09/27/19                                     Social Determinants of Health (SDOH) Interventions    Readmission Risk Interventions No flowsheet data found.

## 2019-09-27 NOTE — Progress Notes (Signed)
Subjective: 5 Days Post-Op Procedure(s) (LRB): ARTHROPLASTY BIPOLAR HIP (HEMIARTHROPLASTY) RIGHT (Left) Patient reports pain as mild, denies any significant hip pain this morning. Patient is well, and has had no acute complaints or problems Denies any CP, SOB, ABD pain. We will continue therapy today.  Plan is to go Skilled nursing facility after hospital stay. Being treated for UTI but denies any symptoms this morning.  Objective: Vital signs in last 24 hours: Temp:  [97.9 F (36.6 C)-99 F (37.2 C)] 97.9 F (36.6 C) (01/14 0752) Pulse Rate:  [70-86] 70 (01/14 0753) Resp:  [16-18] 16 (01/14 0752) BP: (98-113)/(41-73) 113/41 (01/14 0752) SpO2:  [90 %-99 %] 93 % (01/14 0753)  Intake/Output from previous day: 01/13 0701 - 01/14 0700 In: 710 [P.O.:360; Blood:350] Out: 500 [Urine:500] Intake/Output this shift: No intake/output data recorded.  Recent Labs    09/25/19 0427 09/26/19 0626 09/27/19 0325  HGB 8.8* 7.8* 9.6*   Recent Labs    09/26/19 0626 09/27/19 0325  WBC 8.0 8.0  RBC 2.77* 3.34*  HCT 25.4* 29.5*  PLT 259 271   Recent Labs    09/26/19 0626 09/27/19 0325  NA 139 139  K 3.6 3.3*  CL 106 107  CO2 27 26  BUN 18 18  CREATININE 0.58 0.54  GLUCOSE 95 95  CALCIUM 8.3* 8.4*   No results for input(s): LABPT, INR in the last 72 hours.  EXAM General - Patient is drowsy but answers all questions this AM. Extremity - Neurovascular intact Sensation intact distally Intact pulses distally Dorsiflexion/Plantar flexion intact No cellulitis present Compartment soft Dressing - dressing C/D/I and no drainage Motor Function - intact, moving foot and toes well on exam.   Past Medical History:  Diagnosis Date  . Alzheimer's dementia (HCC)   . Insomnia   . Low back pain   . Vomiting     Assessment/Plan:   5 Days Post-Op Procedure(s) (LRB): ARTHROPLASTY BIPOLAR HIP (HEMIARTHROPLASTY) RIGHT (Left) Active Problems:   Closed left subtrochanteric femur  fracture (HCC)   Fall   Pressure injury of skin  Estimated body mass index is 16.06 kg/m as calculated from the following:   Height as of this encounter: 5' 7.5" (1.715 m).   Weight as of this encounter: 47.2 kg. Advance diet Up with therapy   Patient has had several bowel movements. Denies any pain this morning. Plan for d/c to SNF today.  Follow-up with [facility] orthopedics in 2 weeks for staple removal and Steri-Strip application Staples can be removed on 10/03/19 Lovenox 40 mg subcu daily x14 days after discharge TED hose bilateral lower extremities x6 weeks  DVT Prophylaxis - Lovenox, TED hose and SCDs Weight-Bearing as tolerated to left leg  J. Horris Latino, PA-C [facility] 09/27/2019, 10:20 AM

## 2020-02-29 ENCOUNTER — Other Ambulatory Visit: Payer: Self-pay

## 2020-02-29 ENCOUNTER — Emergency Department
Admission: EM | Admit: 2020-02-29 | Discharge: 2020-02-29 | Disposition: A | Discharge status: Home / Self Care | Payer: Medicare Other | Attending: Emergency Medicine | Admitting: Emergency Medicine

## 2020-02-29 ENCOUNTER — Emergency Department: Payer: Medicare Other

## 2020-02-29 DIAGNOSIS — R21 Rash and other nonspecific skin eruption: Secondary | ICD-10-CM | POA: Diagnosis present

## 2020-02-29 DIAGNOSIS — Z87891 Personal history of nicotine dependence: Secondary | ICD-10-CM | POA: Insufficient documentation

## 2020-02-29 DIAGNOSIS — F0390 Unspecified dementia without behavioral disturbance: Secondary | ICD-10-CM | POA: Insufficient documentation

## 2020-02-29 DIAGNOSIS — Z7901 Long term (current) use of anticoagulants: Secondary | ICD-10-CM | POA: Diagnosis not present

## 2020-02-29 DIAGNOSIS — Z79899 Other long term (current) drug therapy: Secondary | ICD-10-CM | POA: Diagnosis not present

## 2020-02-29 DIAGNOSIS — Z7982 Long term (current) use of aspirin: Secondary | ICD-10-CM | POA: Diagnosis not present

## 2020-02-29 DIAGNOSIS — L249 Irritant contact dermatitis, unspecified cause: Secondary | ICD-10-CM | POA: Insufficient documentation

## 2020-02-29 LAB — COMPREHENSIVE METABOLIC PANEL
ALT: 14 U/L (ref 0–44)
AST: 19 U/L (ref 15–41)
Albumin: 3.2 g/dL — ABNORMAL LOW (ref 3.5–5.0)
Alkaline Phosphatase: 72 U/L (ref 38–126)
Anion gap: 8 (ref 5–15)
BUN: 20 mg/dL (ref 8–23)
CO2: 25 mmol/L (ref 22–32)
Calcium: 9.3 mg/dL (ref 8.9–10.3)
Chloride: 107 mmol/L (ref 98–111)
Creatinine, Ser: 0.69 mg/dL (ref 0.44–1.00)
GFR calc Af Amer: >60 mL/min (ref >60)
GFR calc non Af Amer: >60 mL/min (ref >60)
Glucose, Bld: 103 mg/dL — ABNORMAL HIGH (ref 70–99)
Potassium: 4.2 mmol/L (ref 3.5–5.1)
Sodium: 140 mmol/L (ref 135–145)
Total Bilirubin: 0.6 mg/dL (ref 0.3–1.2)
Total Protein: 6 g/dL — ABNORMAL LOW (ref 6.5–8.1)

## 2020-02-29 LAB — URINALYSIS, COMPLETE (UACMP) WITH MICROSCOPIC
Bacteria, UA: NONE SEEN
Bilirubin Urine: NEGATIVE
Glucose, UA: NEGATIVE mg/dL
Hgb urine dipstick: NEGATIVE
Ketones, ur: NEGATIVE mg/dL
Leukocytes,Ua: NEGATIVE
Nitrite: NEGATIVE
Protein, ur: NEGATIVE mg/dL
Specific Gravity, Urine: 1.016 (ref 1.005–1.030)
Squamous Epithelial / HPF: NONE SEEN (ref 0–5)
pH: 6 (ref 5.0–8.0)

## 2020-02-29 LAB — CBC WITH DIFFERENTIAL/PLATELET
Abs Immature Granulocytes: 0.02 10*3/uL (ref 0.00–0.07)
Basophils Absolute: 0 10*3/uL (ref 0.0–0.1)
Basophils Relative: 0 %
Eosinophils Absolute: 0.5 10*3/uL (ref 0.0–0.5)
Eosinophils Relative: 5 %
HCT: 41 % (ref 36.0–46.0)
Hemoglobin: 13.4 g/dL (ref 12.0–15.0)
Immature Granulocytes: 0 %
Lymphocytes Relative: 30 %
Lymphs Abs: 2.6 10*3/uL (ref 0.7–4.0)
MCH: 29.1 pg (ref 26.0–34.0)
MCHC: 32.7 g/dL (ref 30.0–36.0)
MCV: 88.9 fL (ref 80.0–100.0)
Monocytes Absolute: 0.9 10*3/uL (ref 0.1–1.0)
Monocytes Relative: 10 %
Neutro Abs: 4.7 10*3/uL (ref 1.7–7.7)
Neutrophils Relative %: 55 %
Platelets: 309 10*3/uL (ref 150–400)
RBC: 4.61 MIL/uL (ref 3.87–5.11)
RDW: 14 % (ref 11.5–15.5)
WBC: 8.6 10*3/uL (ref 4.0–10.5)
nRBC: 0 % (ref 0.0–0.2)

## 2020-02-29 MED ORDER — DOXYCYCLINE HYCLATE 100 MG PO CAPS
100.0000 mg | ORAL_CAPSULE | Freq: Two times a day (BID) | ORAL | 0 refills | Status: AC
Start: 2020-02-29 — End: 2020-03-07

## 2020-02-29 MED ORDER — HYDROCORTISONE 1 % EX CREA
TOPICAL_CREAM | Freq: Once | CUTANEOUS | Status: AC
  Filled 2020-02-29: qty 28

## 2020-02-29 MED ORDER — SODIUM CHLORIDE 0.9 % IV BOLUS
250.0000 mL | Freq: Once | INTRAVENOUS | Status: AC
  Administered 2020-02-29: 250 mL via INTRAVENOUS

## 2020-02-29 MED ORDER — DOXYCYCLINE HYCLATE 100 MG PO TABS
100.0000 mg | ORAL_TABLET | Freq: Once | ORAL | Status: AC
  Administered 2020-02-29: 100 mg via ORAL
  Filled 2020-02-29: qty 1

## 2020-02-29 MED ORDER — FAMOTIDINE 20 MG PO TABS
20.0000 mg | ORAL_TABLET | Freq: Once | ORAL | Status: AC
  Administered 2020-02-29: 20 mg via ORAL
  Filled 2020-02-29: qty 1

## 2020-02-29 MED ORDER — HYDROCORTISONE 0.5 % EX CREA: 1.0000 "application " | TOPICAL_CREAM | Freq: Two times a day (BID) | CUTANEOUS | 0 refills | Status: AC

## 2020-02-29 MED ORDER — RIVASTIGMINE TARTRATE 3 MG PO CAPS
6.0000 mg | ORAL_CAPSULE | Freq: Once | ORAL | Status: AC
  Administered 2020-02-29: 6 mg via ORAL
  Filled 2020-02-29: qty 2

## 2020-02-29 NOTE — ED Notes (Signed)
Pt titrated to RA from [place]. SpO2 94%.

## 2020-02-29 NOTE — ED Provider Notes (Signed)
[facility] Emergency Department Provider Note  ____________________________________________   First MD Initiated Contact with Patient 02/29/20 1126     (approximate)  I have reviewed the triage vital signs and the nursing notes.   HISTORY  Chief Complaint Allergic Reaction    HPI [name] is a 79 y.o. female  Here with facial rash. History limited 2/2 dementia. Per EMS report, EMS was called to pt's room at [facility] today due to concern for red rash to the face. Rash began yesterday and has gotten progressively worse. Facility became concerned about possible allergic rxn so she was brought to the ED. No known new medications or irritants. Pt has been at her mental baseline.  Level 5 caveat invoked as remainder of history, ROS, and physical exam limited due to patient's dementia.         Past Medical History:  Diagnosis Date  . Alzheimer's dementia ([place])   . Insomnia   . Low back pain   . Vomiting     Patient Active Problem List   Diagnosis Date Noted  . Pressure injury of skin 09/25/2019  . Fall   . Closed left subtrochanteric femur fracture ([place]) 09/21/2019    Past Surgical History:  Procedure Laterality Date  . HIP ARTHROPLASTY Left 09/22/2019   Procedure: ARTHROPLASTY BIPOLAR HIP (HEMIARTHROPLASTY) RIGHT;  Surgeon: Corky Mull, MD;  Location: [facility] ORS;  Service: Orthopedics;  Laterality: Left;  Marland Kitchen VAGINAL HYSTERECTOMY      Prior to Admission medications   Medication Sig Start Date End Date Taking? Authorizing Provider  acetaminophen (TYLENOL) 500 MG tablet Take 500 mg by mouth every 4 (four) hours as needed for mild pain or fever.     [provider]  alum & mag hydroxide-simeth (MINTOX) 200-200-20 MG/5ML suspension Take 30 mLs by mouth 4 (four) times daily as needed for indigestion or heartburn.    [provider]  aspirin 81 MG chewable tablet Chew by mouth daily.    [provider]    cholecalciferol (VITAMIN D3) 25 MCG (1000 UT) tablet Take 1,000 Units by mouth daily.    [provider]  divalproex (DEPAKOTE) 125 MG DR tablet Take 125 mg by mouth 2 (two) times daily.    [provider]  doxycycline (VIBRAMYCIN) 100 MG capsule Take 1 capsule (100 mg total) by mouth 2 (two) times daily for 7 days. 02/29/20 03/07/20  Duffy Bruce, MD  enoxaparin (LOVENOX) 40 MG/0.4ML injection Inject 0.4 mLs (40 mg total) into the skin daily. 09/24/19   Lattie Corns, PA-C  eszopiclone (LUNESTA) 2 MG TABS tablet Take 2 mg by mouth at bedtime. Take immediately before bedtime    [provider]  feeding supplement, ENSURE ENLIVE, (ENSURE ENLIVE) LIQD Take 237 mLs by mouth 2 (two) times daily between meals. 09/27/19   Lavina Hamman, MD  guaifenesin (ROBITUSSIN) 100 MG/5ML syrup Take 200 mg by mouth every 6 (six) hours as needed for cough.    [provider]  HYDROcodone-acetaminophen (NORCO/VICODIN) 5-325 MG tablet Take 1-2 tablets by mouth every 4 (four) hours as needed for moderate pain (pain score 4-6). 09/24/19   Lattie Corns, PA-C  hydrocortisone cream 0.5 % Apply 1 application topically 2 (two) times daily for 5 days. 02/29/20 03/05/20  Duffy Bruce, MD  lactulose, encephalopathy, (CHRONULAC) 10 GM/15ML SOLN Take 30 g by mouth daily.    [provider]  loperamide (IMODIUM) 2 MG capsule Take 2 mg by mouth every  3 (three) hours as needed for diarrhea or loose stools.    [provider]  magnesium hydroxide (MILK OF MAGNESIA) 400 MG/5ML suspension Take 30 mLs by mouth at bedtime as needed for mild constipation or moderate constipation.    [provider]  Menthol-Zinc Oxide 0.44-20.625 % OINT Apply 1 application topically 4 (four) times daily as needed (skin irritation).    [provider]  mirtazapine (REMERON) 30 MG tablet Take 30 mg by mouth daily.    [provider]  neomycin-bacitracin-polymyxin  (NEOSPORIN) ointment Apply 1 application topically 4 (four) times daily as needed for wound care.    [provider]  OLANZapine (ZYPREXA) 5 MG tablet Take 5 mg by mouth at bedtime.    [provider]  omeprazole (PRILOSEC) 20 MG capsule Take 20 mg by mouth daily before breakfast.    [provider]  ondansetron (ZOFRAN-ODT) 4 MG disintegrating tablet Take 4 mg by mouth every 6 (six) hours as needed for nausea or vomiting.    [provider]  polyethylene glycol (MIRALAX / GLYCOLAX) 17 g packet Take 17 g by mouth at bedtime.    [provider]  rivastigmine (EXELON) 6 MG capsule Take 6 mg by mouth 2 (two) times daily.    [provider]  senna-docusate (SENOKOT-S) 8.6-50 MG tablet Take 2 tablets by mouth 2 (two) times daily.    [provider]  vortioxetine HBr (TRINTELLIX) 20 MG TABS tablet Take 20 mg by mouth daily.    [provider]    Allergies Patient has no known allergies.  No family history on file.  Social History Social History   Tobacco Use  . Smoking status: Former Games developer  . Smokeless tobacco: Never Used  Substance Use Topics  . Alcohol use: Not Currently  . Drug use: Not Currently    Review of Systems  Review of Systems  Unable to perform ROS: Dementia     ____________________________________________  PHYSICAL EXAM:      VITAL SIGNS: ED Triage Vitals [02/29/20 1104]  Enc Vitals Group     BP 121/63     Pulse Rate 76     Resp      Temp 98.6 F (37 C)     Temp Source Oral     SpO2 90 %     Weight 130 lb (59 kg)     Height 5\' 7"  (1.702 m)     Head Circumference      [facility] Flow      Pain Score 5     Pain Loc      Pain Edu?      Excl. in GC?      Physical Exam Vitals and nursing note reviewed.  Constitutional:      General: She is not in acute distress.    Appearance: She is well-developed.  HENT:     Head: Normocephalic and atraumatic.     Comments: Well-demarcated erythema  throughout the entire face, as pictured. This spares the eyes and skin folds and is in a mask like distribution. This does not extend to the scalp or neck and is well demarcated at the chin. No significant warmth, drainage, or induration. Oral mucosa pink, well moisturized with NO oral or mucosal lesions. Eyes:     Conjunctiva/sclera: Conjunctivae normal.  Cardiovascular:     Rate and Rhythm: Normal rate and regular rhythm.     Heart sounds: Normal heart sounds.  Pulmonary:     Effort: Pulmonary  effort is normal. No respiratory distress.     Breath sounds: No wheezing.  Abdominal:     General: There is no distension.  Musculoskeletal:     Cervical back: Neck supple.  Skin:    General: Skin is warm.     Capillary Refill: Capillary refill takes less than 2 seconds.     Findings: No rash.     Comments: Scattered SKs and skin hyperpigmentation but no other rash noticed on full skin exam, including no apparent erythema or swelling of labia or buttocks/anal area.   Neurological:     Mental Status: She is alert and oriented to person, place, and time.     Motor: No abnormal muscle tone.       ____________________________________________   LABS (all labs ordered are listed, but only abnormal results are displayed)  Labs Reviewed  COMPREHENSIVE METABOLIC PANEL - Abnormal; Notable for the following components:      Result Value   Glucose, Bld 103 (*)    Total Protein 6.0 (*)    Albumin 3.2 (*)    All other components within normal limits  URINALYSIS, COMPLETE (UACMP) WITH MICROSCOPIC - Abnormal; Notable for the following components:   Color, Urine YELLOW (*)    APPearance CLEAR (*)    All other components within normal limits  CBC WITH DIFFERENTIAL/PLATELET    ____________________________________________  EKG: None ________________________________________  RADIOLOGY All imaging, including plain films, CT scans, and ultrasounds, independently reviewed by me, and interpretations  confirmed via formal radiology reads.  ED MD interpretation:   CXR: Low lung volumes, otherwise unremarkable  Official radiology report(s): DG Chest Portable 1 View  Result Date: 02/29/2020 CLINICAL DATA:  Weakness and fatigue EXAM: PORTABLE CHEST 1 VIEW COMPARISON:  September 21, 2019 FINDINGS: The heart, hila, and mediastinum are unchanged. No pneumothorax. Hyperinflation of the lungs consistent with COPD or emphysema. Mild atelectasis in the bases. No other acute abnormalities. IMPRESSION: Mild opacities in the bases favored represent atelectasis. Subtle infiltrates considered less likely. 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 Electronically Signed   By: Gerome Sam III M.D   On: 02/29/2020 12:14    ____________________________________________  PROCEDURES   Procedure(s) performed (including Critical Care):  Procedures  ____________________________________________  INITIAL IMPRESSION / MDM / ASSESSMENT AND PLAN / ED COURSE  As part of my medical decision making, I reviewed the following data within the electronic MEDICAL RECORD NUMBER Nursing notes reviewed and incorporated, Old chart reviewed, Notes from prior ED visits, and [place] Controlled Substance Database       *SIMCHA FARRINGTON was evaluated in Emergency Department on 02/29/2020 for the symptoms described in the history of present illness. She was evaluated in the context of the global COVID-19 pandemic, which necessitated consideration that the patient might be at risk for infection with the SARS-CoV-2 virus that causes COVID-19. Institutional protocols and algorithms that pertain to the evaluation of patients at risk for COVID-19 are in a state of rapid change based on information released by regulatory bodies including the CDC and federal and state organizations. These policies and algorithms were followed during the patient's care in the ED.  Some ED evaluations and interventions may be delayed as a result of  limited staffing during the pandemic.*     Medical Decision Making:  79 yo F here with red, well demarcated rash to the face in a mask like distribution. No new drugs or allergen exposures. Highly suspect contact dermatitis due to self-application of cream or rubbing of  material, as distribution is on the face only, spares the eyes as would makeup, and patient is noted to touch the area frequently. She has no fever, leukocytosis, warmth, induration, or tenderness to suggest a cellulitis or erysipelas. There are no other areas of rash/redness throughout the body and mucosa and exam, history is not c/w SJS or TEN. There is no sloughing or skin or Nikolsky's sign. Patient is otherwise non-toxic and well appearing. I had a long discussion with pt's SNF, who states it is possible pt could have applied something to her face. Will trial low potency hydrocortisone cream for now. Given that she is picking at the area with some areas of excoriation and redness, will conservatively treat with doxy as well in event of early cellulitis/erysipelas though this is significantly less likely. Otherwise, labs reassuring. Low lung volumes likely 2/2 positioning and pt is satting >92% on RA, no cough. No signs of UTI or hematuria.   ____________________________________________  FINAL CLINICAL IMPRESSION(S) / ED DIAGNOSES  Final diagnoses:  Irritant contact dermatitis, unspecified trigger     MEDICATIONS GIVEN DURING THIS VISIT:  Medications  hydrocortisone cream 1 % ( Topical Given 02/29/20 1536)  doxycycline (VIBRA-TABS) tablet 100 mg (100 mg Oral Given 02/29/20 1450)  famotidine (PEPCID) tablet 20 mg (20 mg Oral Given 02/29/20 1450)  sodium chloride 0.9 % bolus 250 mL (0 mLs Intravenous Stopped 02/29/20 1535)  rivastigmine (EXELON) capsule 6 mg (6 mg Oral Given 02/29/20 1535)     ED Discharge Orders         Ordered    hydrocortisone cream 0.5 %  2 times daily     Discontinue  Reprint     02/29/20 1530     doxycycline (VIBRAMYCIN) 100 MG capsule  2 times daily     Discontinue  Reprint     02/29/20 1530           Note:  This document was prepared using Dragon voice recognition software and may include unintentional dictation errors.   Shaune Pollack, MD 02/29/20 [phone]

## 2020-02-29 NOTE — ED Notes (Signed)
Pt resting comfortably at this time, stretcher locked in lowest position, call bell within reach. RR even and unlabored.

## 2020-02-29 NOTE — ED Notes (Signed)
Pt SpO2 88%, placed on 2L [place]. Will notify MD

## 2020-02-29 NOTE — ED Triage Notes (Addendum)
Pt to ED via [facility] from [facility] for chief complaint of allergic rxn to unknown source. Redness and swelling noted to facial area. Pt reports "I just dont feel good". Denies SHOB or difficulty breathing. No swelling noted to tongue.  Pt oriented to person and place.

## 2020-02-29 NOTE — ED Notes (Signed)
Peri care provided and pt changed 

## 2020-02-29 NOTE — ED Notes (Signed)
Dr Erma Heritage spoke with RN at [facility] house to update on d/c

## 2020-02-29 NOTE — ED Notes (Signed)
Pt given graham crackers and ice water 

## 2020-02-29 NOTE — ED Notes (Signed)
Spoke with [name] from [facility] regarding pt returning to facility. Pt's nurse/caregiver to call back RN with questions

## 2020-02-29 NOTE — ED Notes (Signed)
EMS called for transport back to [facility] 

## 2020-02-29 NOTE — Discharge Instructions (Addendum)
Ms. [name] rash was concerning for possible contact dermatitis or chemical burn  PLEASE LOOK AT ALL CABINETS IN HER ROOM TO MAKE SURE SHE DID NOT ACCIDENTALLY APPLY A DIFFERENT CREAM OR CHEMICAL TO HER FACE  For now, apply the hydrocortisone cream as prescribed  I have also prescribed an antibiotic to prevent infection  Return immediately if any symptoms worsen

## 2020-02-29 NOTE — ED Notes (Signed)
Attempted to contact legal guardian Rykiell Turner/DSS at [phone], voicemail left. Will attempt again.

## 2020-12-12 ENCOUNTER — Emergency Department: Payer: Medicare Other

## 2020-12-12 ENCOUNTER — Encounter: Payer: Self-pay | Admitting: Emergency Medicine

## 2020-12-12 ENCOUNTER — Inpatient Hospital Stay
Admission: EM | Admit: 2020-12-12 | Discharge: 2020-12-16 | DRG: 193 | Disposition: A | Discharge status: Skilled Nursing Facility | Payer: Medicare Other | Attending: Obstetrics and Gynecology | Admitting: Obstetrics and Gynecology

## 2020-12-12 ENCOUNTER — Other Ambulatory Visit: Payer: Self-pay

## 2020-12-12 DIAGNOSIS — Z681 Body mass index (BMI) 19 or less, adult: Secondary | ICD-10-CM

## 2020-12-12 DIAGNOSIS — Z7982 Long term (current) use of aspirin: Secondary | ICD-10-CM | POA: Diagnosis not present

## 2020-12-12 DIAGNOSIS — Z79899 Other long term (current) drug therapy: Secondary | ICD-10-CM | POA: Diagnosis not present

## 2020-12-12 DIAGNOSIS — J9811 Atelectasis: Secondary | ICD-10-CM | POA: Diagnosis present

## 2020-12-12 DIAGNOSIS — F0280 Dementia in other diseases classified elsewhere without behavioral disturbance: Secondary | ICD-10-CM | POA: Diagnosis present

## 2020-12-12 DIAGNOSIS — Z90710 Acquired absence of both cervix and uterus: Secondary | ICD-10-CM

## 2020-12-12 DIAGNOSIS — G4700 Insomnia, unspecified: Secondary | ICD-10-CM | POA: Diagnosis present

## 2020-12-12 DIAGNOSIS — F32A Depression, unspecified: Secondary | ICD-10-CM | POA: Diagnosis present

## 2020-12-12 DIAGNOSIS — Z20822 Contact with and (suspected) exposure to covid-19: Secondary | ICD-10-CM | POA: Diagnosis present

## 2020-12-12 DIAGNOSIS — K449 Diaphragmatic hernia without obstruction or gangrene: Secondary | ICD-10-CM | POA: Diagnosis present

## 2020-12-12 DIAGNOSIS — Z7952 Long term (current) use of systemic steroids: Secondary | ICD-10-CM

## 2020-12-12 DIAGNOSIS — R911 Solitary pulmonary nodule: Secondary | ICD-10-CM | POA: Diagnosis present

## 2020-12-12 DIAGNOSIS — R0602 Shortness of breath: Secondary | ICD-10-CM | POA: Diagnosis present

## 2020-12-12 DIAGNOSIS — Z66 Do not resuscitate: Secondary | ICD-10-CM | POA: Diagnosis present

## 2020-12-12 DIAGNOSIS — E43 Unspecified severe protein-calorie malnutrition: Secondary | ICD-10-CM | POA: Diagnosis present

## 2020-12-12 DIAGNOSIS — G309 Alzheimer's disease, unspecified: Secondary | ICD-10-CM | POA: Diagnosis present

## 2020-12-12 DIAGNOSIS — Z96643 Presence of artificial hip joint, bilateral: Secondary | ICD-10-CM | POA: Diagnosis present

## 2020-12-12 DIAGNOSIS — J9601 Acute respiratory failure with hypoxia: Secondary | ICD-10-CM | POA: Diagnosis present

## 2020-12-12 DIAGNOSIS — D75839 Thrombocytosis, unspecified: Secondary | ICD-10-CM | POA: Diagnosis present

## 2020-12-12 DIAGNOSIS — A419 Sepsis, unspecified organism: Secondary | ICD-10-CM

## 2020-12-12 DIAGNOSIS — J189 Pneumonia, unspecified organism: Principal | ICD-10-CM | POA: Diagnosis present

## 2020-12-12 DIAGNOSIS — Z87891 Personal history of nicotine dependence: Secondary | ICD-10-CM

## 2020-12-12 DIAGNOSIS — R0789 Other chest pain: Secondary | ICD-10-CM | POA: Diagnosis present

## 2020-12-12 DIAGNOSIS — J9621 Acute and chronic respiratory failure with hypoxia: Secondary | ICD-10-CM | POA: Diagnosis not present

## 2020-12-12 LAB — RESP PANEL BY RT-PCR (FLU A&B, COVID) ARPGX2
Influenza A by PCR: NEGATIVE
Influenza B by PCR: NEGATIVE
SARS Coronavirus 2 by RT PCR: NEGATIVE

## 2020-12-12 LAB — COMPREHENSIVE METABOLIC PANEL
ALT: 23 U/L (ref 0–44)
AST: 26 U/L (ref 15–41)
Albumin: 3.5 g/dL (ref 3.5–5.0)
Alkaline Phosphatase: 84 U/L (ref 38–126)
Anion gap: 12 (ref 5–15)
BUN: 19 mg/dL (ref 8–23)
CO2: 22 mmol/L (ref 22–32)
Calcium: 9.6 mg/dL (ref 8.9–10.3)
Chloride: 104 mmol/L (ref 98–111)
Creatinine, Ser: 0.67 mg/dL (ref 0.44–1.00)
GFR, Estimated: >60 mL/min (ref >60)
Glucose, Bld: 148 mg/dL — ABNORMAL HIGH (ref 70–99)
Potassium: 3.8 mmol/L (ref 3.5–5.1)
Sodium: 138 mmol/L (ref 135–145)
Total Bilirubin: 0.4 mg/dL (ref 0.3–1.2)
Total Protein: 7.4 g/dL (ref 6.5–8.1)

## 2020-12-12 LAB — CBC
HCT: 38.6 % (ref 36.0–46.0)
Hemoglobin: 12.1 g/dL (ref 12.0–15.0)
MCH: 28.7 pg (ref 26.0–34.0)
MCHC: 31.3 g/dL (ref 30.0–36.0)
MCV: 91.7 fL (ref 80.0–100.0)
Platelets: 493 10*3/uL — ABNORMAL HIGH (ref 150–400)
RBC: 4.21 MIL/uL (ref 3.87–5.11)
RDW: 13.2 % (ref 11.5–15.5)
WBC: 22.7 10*3/uL — ABNORMAL HIGH (ref 4.0–10.5)
nRBC: 0 % (ref 0.0–0.2)

## 2020-12-12 LAB — URINALYSIS, COMPLETE (UACMP) WITH MICROSCOPIC
Bacteria, UA: NONE SEEN
Bilirubin Urine: NEGATIVE
Glucose, UA: NEGATIVE mg/dL
Hgb urine dipstick: NEGATIVE
Ketones, ur: 5 mg/dL — AB
Nitrite: NEGATIVE
Protein, ur: NEGATIVE mg/dL
Specific Gravity, Urine: >1.046 — ABNORMAL HIGH (ref 1.005–1.030)
pH: 5 (ref 5.0–8.0)

## 2020-12-12 LAB — LACTIC ACID, PLASMA
Lactic Acid, Venous: 1.7 mmol/L (ref 0.5–1.9)
Lactic Acid, Venous: 1.5 mmol/L (ref 0.5–1.9)

## 2020-12-12 LAB — TROPONIN I (HIGH SENSITIVITY)
Troponin I (High Sensitivity): 7 ng/L (ref <18)
Troponin I (High Sensitivity): 7 ng/L (ref <18)

## 2020-12-12 MED ORDER — ENOXAPARIN SODIUM 40 MG/0.4ML ~~LOC~~ SOLN
40.0000 mg | SUBCUTANEOUS | Status: DC
  Administered 2020-12-12 – 2020-12-15 (×4): 40 mg via SUBCUTANEOUS
  Filled 2020-12-12 (×4): qty 0.4

## 2020-12-12 MED ORDER — IOHEXOL 350 MG/ML SOLN
75.0000 mL | Freq: Once | INTRAVENOUS | Status: AC | PRN
  Administered 2020-12-12: 75 mL via INTRAVENOUS

## 2020-12-12 MED ORDER — SODIUM CHLORIDE 0.9 % IV SOLN
500.0000 mg | INTRAVENOUS | Status: DC
  Administered 2020-12-12 – 2020-12-15 (×4): 500 mg via INTRAVENOUS
  Filled 2020-12-12 (×5): qty 500

## 2020-12-12 MED ORDER — ONDANSETRON HCL 4 MG/2ML IJ SOLN
4.0000 mg | Freq: Once | INTRAMUSCULAR | Status: AC
  Administered 2020-12-12: 4 mg via INTRAVENOUS

## 2020-12-12 MED ORDER — ONDANSETRON 4 MG PO TBDP: 4.0000 mg | ORAL_TABLET | Freq: Once | ORAL | Status: DC

## 2020-12-12 MED ORDER — ONDANSETRON HCL 4 MG/2ML IJ SOLN
INTRAMUSCULAR | Status: AC
  Administered 2020-12-12: 4 mg via INTRAVENOUS
  Filled 2020-12-12: qty 2

## 2020-12-12 MED ORDER — ACETAMINOPHEN 500 MG PO TABS
500.0000 mg | ORAL_TABLET | Freq: Four times a day (QID) | ORAL | Status: DC | PRN
  Administered 2020-12-15: 500 mg via ORAL
  Filled 2020-12-12: qty 1

## 2020-12-12 MED ORDER — DICLOFENAC SODIUM 1 % EX GEL
2.0000 g | Freq: Four times a day (QID) | CUTANEOUS | Status: DC
  Administered 2020-12-12 – 2020-12-16 (×13): 2 g via TOPICAL
  Filled 2020-12-12: qty 100

## 2020-12-12 MED ORDER — HYDROCODONE-ACETAMINOPHEN 5-325 MG PO TABS
1.0000 | ORAL_TABLET | ORAL | Status: DC | PRN
  Administered 2020-12-12 – 2020-12-13 (×4): 2 via ORAL
  Filled 2020-12-12 (×3): qty 2
  Filled 2020-12-12: qty 1
  Filled 2020-12-12: qty 2

## 2020-12-12 MED ORDER — GUAIFENESIN 100 MG/5ML PO SOLN
200.0000 mg | Freq: Four times a day (QID) | ORAL | Status: DC | PRN
  Filled 2020-12-12: qty 10

## 2020-12-12 MED ORDER — PANTOPRAZOLE SODIUM 40 MG PO TBEC
40.0000 mg | DELAYED_RELEASE_TABLET | Freq: Every day | ORAL | Status: DC
  Administered 2020-12-13 – 2020-12-16 (×4): 40 mg via ORAL
  Filled 2020-12-12 (×4): qty 1

## 2020-12-12 MED ORDER — ASPIRIN 81 MG PO CHEW
81.0000 mg | CHEWABLE_TABLET | Freq: Every day | ORAL | Status: DC
  Administered 2020-12-13 – 2020-12-16 (×4): 81 mg via ORAL
  Filled 2020-12-12 (×4): qty 1

## 2020-12-12 MED ORDER — SODIUM CHLORIDE 0.9 % IV SOLN: INTRAVENOUS | Status: DC

## 2020-12-12 MED ORDER — GUAIFENESIN ER 600 MG PO TB12
600.0000 mg | ORAL_TABLET | Freq: Two times a day (BID) | ORAL | Status: DC
  Administered 2020-12-12 – 2020-12-16 (×8): 600 mg via ORAL
  Filled 2020-12-12 (×8): qty 1

## 2020-12-12 MED ORDER — SODIUM CHLORIDE 0.9 % IV SOLN
2.0000 g | INTRAVENOUS | Status: DC
  Administered 2020-12-12 – 2020-12-15 (×4): 2 g via INTRAVENOUS
  Filled 2020-12-12: qty 2
  Filled 2020-12-12 (×4): qty 20

## 2020-12-12 MED ORDER — POLYETHYLENE GLYCOL 3350 17 G PO PACK
17.0000 g | PACK | Freq: Every day | ORAL | Status: DC
  Administered 2020-12-12 – 2020-12-15 (×4): 17 g via ORAL
  Filled 2020-12-12 (×4): qty 1

## 2020-12-12 MED ORDER — SODIUM CHLORIDE 0.9 % IV BOLUS
1000.0000 mL | Freq: Once | INTRAVENOUS | Status: AC
  Administered 2020-12-12: 1000 mL via INTRAVENOUS

## 2020-12-12 MED ORDER — IPRATROPIUM-ALBUTEROL 0.5-2.5 (3) MG/3ML IN SOLN: 3.0000 mL | RESPIRATORY_TRACT | Status: DC | PRN

## 2020-12-12 MED ORDER — FENTANYL CITRATE (PF) 100 MCG/2ML IJ SOLN
50.0000 ug | Freq: Once | INTRAMUSCULAR | Status: AC
  Administered 2020-12-12: 50 ug via INTRAVENOUS
  Filled 2020-12-12: qty 2

## 2020-12-12 NOTE — ED Provider Notes (Signed)
[facility] Emergency Department Provider Note  Time seen: 9:40 AM  I have reviewed the triage vital signs and the nursing notes.   HISTORY  Chief Complaint Back Pain and Neck Pain   HPI INGRID SHIFRIN is a 80 y.o. female with a past medical history of dementia, presents to the emergency department with left-sided neck and back pain.  According to the patient while sleeping she rolled over and felt a "pop."  EMS states this morning the patient informed staff of pain and feeling a pop so they sent her to the emergency department for evaluation.  Patient describes the pain as starting in her left neck and going down her back to her left lateral chest/left breast.  States it is worse if she moves her left arm.  Patient denies any vomiting or shortness of breath.  Past Medical History:  Diagnosis Date  . Alzheimer's dementia (HCC)   . Insomnia   . Low back pain   . Vomiting     Patient Active Problem List   Diagnosis Date Noted  . Pressure injury of skin 09/25/2019  . Fall   . Closed left subtrochanteric femur fracture (HCC) 09/21/2019    Past Surgical History:  Procedure Laterality Date  . HIP ARTHROPLASTY Left 09/22/2019   Procedure: ARTHROPLASTY BIPOLAR HIP (HEMIARTHROPLASTY) RIGHT;  Surgeon: Christena Flake, MD;  Location: [facility] ORS;  Service: Orthopedics;  Laterality: Left;  Marland Kitchen VAGINAL HYSTERECTOMY      Prior to Admission medications   Medication Sig Start Date End Date Taking? Authorizing Provider  acetaminophen (TYLENOL) 500 MG tablet Take 500 mg by mouth every 4 (four) hours as needed for mild pain or fever.     [provider]  alum & mag hydroxide-simeth (MINTOX) 200-200-20 MG/5ML suspension Take 30 mLs by mouth 4 (four) times daily as needed for indigestion or heartburn.    [provider]  aspirin 81 MG chewable tablet Chew by mouth daily.    [provider]  cholecalciferol (VITAMIN D3) 25 MCG (1000 UT) tablet Take 1,000  Units by mouth daily.    [provider]  divalproex (DEPAKOTE) 125 MG DR tablet Take 125 mg by mouth 2 (two) times daily.    [provider]  enoxaparin (LOVENOX) 40 MG/0.4ML injection Inject 0.4 mLs (40 mg total) into the skin daily. 09/24/19   Anson Oregon, PA-C  eszopiclone (LUNESTA) 2 MG TABS tablet Take 2 mg by mouth at bedtime. Take immediately before bedtime    [provider]  feeding supplement, ENSURE ENLIVE, (ENSURE ENLIVE) LIQD Take 237 mLs by mouth 2 (two) times daily between meals. 09/27/19   Rolly Salter, MD  guaifenesin (ROBITUSSIN) 100 MG/5ML syrup Take 200 mg by mouth every 6 (six) hours as needed for cough.    [provider]  HYDROcodone-acetaminophen (NORCO/VICODIN) 5-325 MG tablet Take 1-2 tablets by mouth every 4 (four) hours as needed for moderate pain (pain score 4-6). 09/24/19   Anson Oregon, PA-C  lactulose, encephalopathy, (CHRONULAC) 10 GM/15ML SOLN Take 30 g by mouth daily.    [provider]  loperamide (IMODIUM) 2 MG capsule Take 2 mg by mouth every 3 (three) hours as needed for diarrhea or loose stools.    [provider]  magnesium hydroxide (MILK OF MAGNESIA) 400 MG/5ML suspension Take 30 mLs by mouth at bedtime as needed for mild constipation or moderate constipation.    [provider]  Menthol-Zinc Oxide 0.44-20.625 % OINT Apply  1 application topically 4 (four) times daily as needed (skin irritation).    [provider]  mirtazapine (REMERON) 30 MG tablet Take 30 mg by mouth daily.    [provider]  neomycin-bacitracin-polymyxin (NEOSPORIN) ointment Apply 1 application topically 4 (four) times daily as needed for wound care.    [provider]  OLANZapine (ZYPREXA) 5 MG tablet Take 5 mg by mouth at bedtime.    [provider]  omeprazole (PRILOSEC) 20 MG capsule Take 20 mg by mouth daily before breakfast.    [provider]  ondansetron  (ZOFRAN-ODT) 4 MG disintegrating tablet Take 4 mg by mouth every 6 (six) hours as needed for nausea or vomiting.    [provider]  polyethylene glycol (MIRALAX / GLYCOLAX) 17 g packet Take 17 g by mouth at bedtime.    [provider]  rivastigmine (EXELON) 6 MG capsule Take 6 mg by mouth 2 (two) times daily.    [provider]  senna-docusate (SENOKOT-S) 8.6-50 MG tablet Take 2 tablets by mouth 2 (two) times daily.    [provider]  vortioxetine HBr (TRINTELLIX) 20 MG TABS tablet Take 20 mg by mouth daily.    [provider]    No Known Allergies  No family history on file.  Social History Social History   Tobacco Use  . Smoking status: Former Games developer  . Smokeless tobacco: Never Used  Substance Use Topics  . Alcohol use: Not Currently  . Drug use: Not Currently    Review of Systems Constitutional: Negative for fever Cardiovascular: Left lateral chest/chest wall pain. Respiratory: Negative for shortness of breath. Gastrointestinal: Negative for abdominal pain Genitourinary: Negative for urinary compaints Musculoskeletal: Left neck/left upper back/left chest discomfort.  Worse with movement. Skin: Negative for skin complaints  Neurological: Negative for headache All other ROS negative  ____________________________________________   PHYSICAL EXAM:  VITAL SIGNS: ED Triage Vitals  Enc Vitals Group     BP 12/12/20 0909 124/62     Pulse Rate 12/12/20 0909 92     Resp 12/12/20 0909 (!) 22     Temp 12/12/20 0909 97.7 F (36.5 C)     Temp Source 12/12/20 0909 Oral     SpO2 12/12/20 0909 92 %     Weight 12/12/20 0907 106 lb (48.1 kg)     Height 12/12/20 0907 5\' 7"  (1.702 m)     Head Circumference --      Peak Flow --      Pain Score 12/12/20 0907 6     Pain Loc --      Pain Edu? --      Excl. in GC? --    Constitutional: Alert and oriented. Well appearing and in no distress. Eyes: Normal exam ENT      Head: Normocephalic  and atraumatic.      Mouth/Throat: Mucous membranes are moist. Cardiovascular: Normal rate, regular rhythm.  Respiratory: Normal respiratory effort without tachypnea nor retractions. Breath sounds are clear Gastrointestinal: Soft and nontender. No distention.  Musculoskeletal: Moderate tenderness to palpation of the left trapezius.  No chest wall tenderness.  States pain is worse with movement of the left arm/shoulder. Neurologic:  Normal speech and language. No gross focal neurologic deficits  Skin:  Skin is warm, dry and intact.  Psychiatric: Mood and affect are normal.   ____________________________________________    EKG  EKG viewed and interpreted by myself shows a normal sinus rhythm at 92 bpm with a narrow QRS, normal axis,  normal intervals, nonspecific but no concerning ST changes.  ____________________________________________    RADIOLOGY  Chest x-ray shows basilar lung density on the left side atelectasis versus effusion versus infection. Shoulder x-rays negative  ____________________________________________   INITIAL IMPRESSION / ASSESSMENT AND PLAN / ED COURSE  Pertinent labs & imaging results that were available during my care of the patient were reviewed by me and considered in my medical decision making (see chart for details).   Patient presents to the emergency department for left neck/back pain radiating into the left chest/lateral chest wall.  Patient does have moderate tenderness to palpation over the left trapezius.  Given patient denies any trauma or overuse of this area we will proceed with labs including cardiac enzymes and chest and shoulder imaging to further evaluate.  Patient's labs show significant leukocytosis of 22,000 which was previously normal 9 months ago.  Chest x-ray somewhat equivocal showing a basilar density which could represent atelectasis, pleural effusion or infection.  Given these findings with significant leukocytosis we will proceed  with CT imaging of the chest preferably with contrast depending on the patient's chemistry results.  Patient CTA is essentially negative for significant abnormality although states possible developing pneumonia.  No explanation for the patient's leukocytosis of 22,000.  Reassuringly patient's troponin is negative x2.  Patient has a benign abdominal exam.  Urinalysis is pending.  We will reassess after urine.  Patient care signed out to Dr. Fuller Plan.    MARSHELL RIEGER was evaluated in Emergency Department on 12/12/2020 for the symptoms described in the history of present illness. She was evaluated in the context of the global COVID-19 pandemic, which necessitated consideration that the patient might be at risk for infection with the SARS-CoV-2 virus that causes COVID-19. Institutional protocols and algorithms that pertain to the evaluation of patients at risk for COVID-19 are in a state of rapid change based on information released by regulatory bodies including the CDC and federal and state organizations. These policies and algorithms were followed during the patient's care in the ED.  ____________________________________________   FINAL CLINICAL IMPRESSION(S) / ED DIAGNOSES  Left chest pain   Minna Antis, MD 12/13/20 1421

## 2020-12-12 NOTE — ED Triage Notes (Signed)
Pt to ED via [facility] from [facility] for neck and back pain. Per EMS staff informed them that she rolled over during the night and felt a pop, pt did not inform staff until this morning after breakfast, pt has hx/o Alzheimer's. Pt denies fall. Pt is in NAD.

## 2020-12-12 NOTE — ED Notes (Signed)
Pt at CT

## 2020-12-12 NOTE — Consult Note (Signed)
CODE SEPSIS - PHARMACY COMMUNICATION  **Broad Spectrum Antibiotics should be administered within 1 hour of Sepsis diagnosis**  Time Code Sepsis Called/Page Received: 1719  Antibiotics Ordered: Azithromycin,Rocephin  Time of 1st antibiotic administration: 1732  Additional action taken by pharmacy: none  If necessary, Name of Provider/Nurse Contacted: n/a    Bettey Costa ,PharmD Clinical Pharmacist  12/12/2020  5:52 PM

## 2020-12-12 NOTE — ED Notes (Signed)
[name], from patient's SNF updated on current POC.

## 2020-12-12 NOTE — ED Notes (Signed)
Unable to obtain Zithromax from ED pyxis. Pharmacy notified, medication requested.

## 2020-12-12 NOTE — H&P (Signed)
[facility] History and Physical   Patient: [name] WGN:[id number]   PCP: Patient, No Pcp Per (Inactive) DOB: Nov 05, 1940   DOA: 12/12/2020   DOS: 12/12/2020   DOS: the patient was seen and examined on 12/12/2020   Patient coming from: The patient is coming from SNF  Chief Complaint: Neck pain and back pain then she also started report shortness of breath and cough  HPI: [name] is a 80 y.o. female with Past medical history of Alzheimer's dementia, depression presented from [facility] with complaining of back pain and neck pain, patient rolled over at nighttime when she felt a pop did not tell the staff until today morning after breakfast.  Patient was given fentanyl and she was having respiratory failure in the ED.  On further questioning patient said that she was having cough and shortness of breath as well.  ED Course:  RR 31, hypoxic respiratory failure, leukocytosis WBC 22.7  CXR New basilar lung densities particularly on the left side. Findings could be related to atelectasis and small amount of pleural fluid. Difficult to exclude infection  CTA: No definite evidence of pulmonary embolus. Small left pleural effusion is noted with adjacent subsegmental atelectasis. Right posterior upper lobe opacity is noted concerning for atelectasis or possibly infiltrate. Small sliding-type hiatal hernia. 6 mm nodule is noted anteriorly in the right lower lobe. Non-contrast chest CT at 6-12 months is recommended. If the nodule is stable at time of repeat CT, then future CT at 18-24 months    At her baseline AAO x3, not aware of year and month, is [place] dementia   Review of Systems: as mentioned in the history of present illness.  All other systems reviewed and are negative.  Past Medical History:  Diagnosis Date  . Alzheimer's dementia (HCC)   . Insomnia   . Low back pain   . Vomiting    Past Surgical History:  Procedure Laterality Date  . HIP ARTHROPLASTY  Left 09/22/2019   Procedure: ARTHROPLASTY BIPOLAR HIP (HEMIARTHROPLASTY) RIGHT;  Surgeon: Christena Flake, MD;  Location: [facility] ORS;  Service: Orthopedics;  Laterality: Left;  Marland Kitchen VAGINAL HYSTERECTOMY     Social History:  reports that she has quit smoking. She has never used smokeless tobacco. She reports previous alcohol use. She reports previous drug use.  No Known Allergies  Family history reviewed and not pertinent No family history on file.   Prior to Admission medications   Medication Sig Start Date End Date Taking? Authorizing Provider  acetaminophen (TYLENOL) 500 MG tablet Take 500 mg by mouth every 4 (four) hours as needed for mild pain or fever.     [provider]  alum & mag hydroxide-simeth (MINTOX) 200-200-20 MG/5ML suspension Take 30 mLs by mouth 4 (four) times daily as needed for indigestion or heartburn.    [provider]  aspirin 81 MG chewable tablet Chew by mouth daily.    [provider]  cholecalciferol (VITAMIN D3) 25 MCG (1000 UT) tablet Take 1,000 Units by mouth daily.    [provider]  divalproex (DEPAKOTE) 125 MG DR tablet Take 125 mg by mouth 2 (two) times daily.    [provider]  enoxaparin (LOVENOX) 40 MG/0.4ML injection Inject 0.4 mLs (40 mg total) into the skin daily. 09/24/19   Anson Oregon, PA-C  eszopiclone (LUNESTA) 2 MG TABS tablet Take 2 mg by mouth at bedtime. Take immediately before bedtime    [provider]  feeding supplement,  ENSURE ENLIVE, (ENSURE ENLIVE) LIQD Take 237 mLs by mouth 2 (two) times daily between meals. 09/27/19   Rolly Salter, MD  guaifenesin (ROBITUSSIN) 100 MG/5ML syrup Take 200 mg by mouth every 6 (six) hours as needed for cough.    [provider]  HYDROcodone-acetaminophen (NORCO/VICODIN) 5-325 MG tablet Take 1-2 tablets by mouth every 4 (four) hours as needed for moderate pain (pain score 4-6). 09/24/19   Anson Oregon, PA-C  lactulose, encephalopathy,  (CHRONULAC) 10 GM/15ML SOLN Take 30 g by mouth daily.    [provider]  loperamide (IMODIUM) 2 MG capsule Take 2 mg by mouth every 3 (three) hours as needed for diarrhea or loose stools.    [provider]  magnesium hydroxide (MILK OF MAGNESIA) 400 MG/5ML suspension Take 30 mLs by mouth at bedtime as needed for mild constipation or moderate constipation.    [provider]  Menthol-Zinc Oxide 0.44-20.625 % OINT Apply 1 application topically 4 (four) times daily as needed (skin irritation).    [provider]  mirtazapine (REMERON) 30 MG tablet Take 30 mg by mouth daily.    [provider]  neomycin-bacitracin-polymyxin (NEOSPORIN) ointment Apply 1 application topically 4 (four) times daily as needed for wound care.    [provider]  OLANZapine (ZYPREXA) 5 MG tablet Take 5 mg by mouth at bedtime.    [provider]  omeprazole (PRILOSEC) 20 MG capsule Take 20 mg by mouth daily before breakfast.    [provider]  ondansetron (ZOFRAN-ODT) 4 MG disintegrating tablet Take 4 mg by mouth every 6 (six) hours as needed for nausea or vomiting.    [provider]  polyethylene glycol (MIRALAX / GLYCOLAX) 17 g packet Take 17 g by mouth at bedtime.    [provider]  rivastigmine (EXELON) 6 MG capsule Take 6 mg by mouth 2 (two) times daily.    [provider]  senna-docusate (SENOKOT-S) 8.6-50 MG tablet Take 2 tablets by mouth 2 (two) times daily.    [provider]  vortioxetine HBr (TRINTELLIX) 20 MG TABS tablet Take 20 mg by mouth daily.    [provider]    Physical Exam: Vitals:   12/12/20 1645 12/12/20 1646 12/12/20 1700 12/12/20 1715  BP:   (!) 125/112   Pulse: 82 80 85   Resp: 18  (!) 24   Temp:      TempSrc:      SpO2: (!) 87% 90% (!) 87% 92%  Weight:      Height:        General: alert and oriented to place and person. Appear in mild distress, affect appropriate Eyes:  PERRLA, Conjunctiva normal ENT: Oral Mucosa Clear, moist  Neck: no JVD, no Abnormal Mass Or lumps Cardiovascular: S1 and S2 Present, no Murmur, peripheral pulses symmetrical Respiratory: good respiratory effort, Bilateral Air entry equal and Decreased, no signs of accessory muscle use, mild Crackles b/l, no wheezes. Left side chest wall tenderness Abdomen: Bowel Sound present, Soft and no tenderness, no hernia Skin: no rashes  Extremities: no Pedal edema, no calf tenderness Neurologic: without any new focal findings Gait not checked due to patient safety concerns  Data Reviewed: I have personally reviewed and interpreted labs, imaging as discussed below.  CBC: Recent Labs  Lab 12/12/20 0918  WBC 22.7*  HGB 12.1  HCT 38.6  MCV 91.7  PLT 493*   Basic Metabolic Panel: Recent Labs  Lab 12/12/20 0918  NA 138  K  3.8  CL 104  CO2 22  GLUCOSE 148*  BUN 19  CREATININE 0.67  CALCIUM 9.6   GFR: Estimated Creatinine Clearance: 42.6 mL/min (by C-G formula based on SCr of 0.67 mg/dL). Liver Function Tests: Recent Labs  Lab 12/12/20 0918  AST 26  ALT 23  ALKPHOS 84  BILITOT 0.4  PROT 7.4  ALBUMIN 3.5   No results for input(s): LIPASE, AMYLASE in the last 168 hours. No results for input(s): AMMONIA in the last 168 hours. Coagulation Profile: No results for input(s): INR, PROTIME in the last 168 hours. Cardiac Enzymes: No results for input(s): CKTOTAL, CKMB, CKMBINDEX, TROPONINI in the last 168 hours. BNP (last 3 results) No results for input(s): PROBNP in the last 8760 hours. HbA1C: No results for input(s): HGBA1C in the last 72 hours. CBG: No results for input(s): GLUCAP in the last 168 hours. Lipid Profile: No results for input(s): CHOL, HDL, LDLCALC, TRIG, CHOLHDL, LDLDIRECT in the last 72 hours. Thyroid Function Tests: No results for input(s): TSH, T4TOTAL, FREET4, T3FREE, THYROIDAB in the last 72 hours. Anemia Panel: No results for input(s): VITAMINB12, FOLATE,  FERRITIN, TIBC, IRON, RETICCTPCT in the last 72 hours. Urine analysis:    Component Value Date/Time   COLORURINE YELLOW (A) 12/12/2020 1628   APPEARANCEUR CLEAR (A) 12/12/2020 1628   APPEARANCEUR Clear 10/22/2012 0404   LABSPEC >1.046 (H) 12/12/2020 1628   LABSPEC 1.035 10/22/2012 0404   PHURINE 5.0 12/12/2020 1628   GLUCOSEU NEGATIVE 12/12/2020 1628   GLUCOSEU Negative 10/22/2012 0404   HGBUR NEGATIVE 12/12/2020 1628   BILIRUBINUR NEGATIVE 12/12/2020 1628   BILIRUBINUR Negative 10/22/2012 0404   KETONESUR 5 (A) 12/12/2020 1628   PROTEINUR NEGATIVE 12/12/2020 1628   UROBILINOGEN 0.2 06/14/2011 1805   NITRITE NEGATIVE 12/12/2020 1628   LEUKOCYTESUR SMALL (A) 12/12/2020 1628   LEUKOCYTESUR Negative 10/22/2012 0404    Radiological Exams on Admission: CT Angio Chest PE W and/or Wo Contrast  Result Date: 12/12/2020 CLINICAL DATA:  Chest pain. EXAM: CT ANGIOGRAPHY CHEST WITH CONTRAST TECHNIQUE: Multidetector CT imaging of the chest was performed using the standard protocol during bolus administration of intravenous contrast. Multiplanar CT image reconstructions and MIPs were obtained to evaluate the vascular anatomy. CONTRAST:  75mL OMNIPAQUE IOHEXOL 350 MG/ML SOLN COMPARISON:  None. FINDINGS: Cardiovascular: Satisfactory opacification of the pulmonary arteries to the segmental level. No evidence of pulmonary embolism. Normal heart size. No pericardial effusion. Atherosclerosis of thoracic aorta is noted without aneurysm or dissection. Mediastinum/Nodes: Thyroid gland appears normal. No adenopathy is noted. Small sliding-type hiatal hernia is noted. Lungs/Pleura: No pneumothorax is noted. Small left pleural effusion is noted with adjacent subsegmental atelectasis. Mild subsegmental atelectasis scarring is noted inferiorly in the right middle lobe. Right posterior upper lobe opacity is noted concerning for atelectasis or possibly infiltrate. 6 mm nodule is noted anteriorly in the right lower lobe  best seen on image number 69 of series 6. Upper Abdomen: No acute abnormality. Musculoskeletal: No chest wall abnormality. No acute or significant osseous findings. Review of the MIP images confirms the above findings. IMPRESSION: No definite evidence of pulmonary embolus. Small left pleural effusion is noted with adjacent subsegmental atelectasis. Right posterior upper lobe opacity is noted concerning for atelectasis or possibly infiltrate. Small sliding-type hiatal hernia. 6 mm nodule is noted anteriorly in the right lower lobe. Non-contrast chest CT at 6-12 months is recommended. If the nodule is stable at time of repeat CT, then future CT at 18-24 months (from today's scan) is considered optional for  low-risk patients, but is recommended for high-risk patients. This recommendation follows the consensus statement: Guidelines for Management of Incidental Pulmonary Nodules Detected on CT Images: From the Fleischner Society 2017; Radiology 2017; 284:228-243. Aortic Atherosclerosis (ICD10-I70.0). Electronically Signed   By: Lupita Raider M.D.   On: 12/12/2020 11:08   DG Chest Portable 1 View  Result Date: 12/12/2020 CLINICAL DATA:  Chest pain and left arm pain. EXAM: PORTABLE CHEST 1 VIEW COMPARISON:  02/29/2020 FINDINGS: New densities at the left lung base. Heart size is within normal limits and stable. There may be vascular congestion or densities at the medial right lung base. Patient is slightly rotated towards the left. No acute bone abnormality. IMPRESSION: New basilar lung densities particularly on the left side. Findings could be related to atelectasis and small amount of pleural fluid. Difficult to exclude infection. Electronically Signed   By: Richarda Overlie M.D.   On: 12/12/2020 09:34   DG Shoulder Left  Result Date: 12/12/2020 CLINICAL DATA:  Left chest pain. EXAM: LEFT SHOULDER - 2+ VIEW COMPARISON:  None. FINDINGS: There is no evidence of fracture or dislocation. There is no evidence of arthropathy  or other focal bone abnormality. Soft tissues are unremarkable. IMPRESSION: Negative. Electronically Signed   By: Lupita Raider M.D.   On: 12/12/2020 09:34    I reviewed all nursing notes, pharmacy notes, vitals, pertinent old records.  Assessment/Plan Principal Problem:   Acute on chronic respiratory failure with hypoxia (HCC) Active Problems:   SOB (shortness of breath)   Community acquired pneumonia   # Acute upper respiratory failure secondary to community-acquired pneumonia Continue supplemental oxygenation and gradually wean off Continue aspiration precaution Start DuoNeb every 6 hourly as needed   # Community-acquired pneumonia CXR and CTA reviewed as above Patient was given ceftriaxone and azithromycin in the ED which has been continued Mucinex twice daily Robitussin-DM as needed Trend WBC count Monitor temperature curve Follow blood culture and respiratory viral panel Urine for strep and Legionella antigen   # Chest wall pain left-sided tenderness, musculoskeletal pain Continue Voltaren gel and Tylenol as needed  # Dementia and depression We will resume home medications after review by pharmacy     Nutrition: Regular diet DVT Prophylaxis: Subcutaneous Lovenox  Advance goals of care discussion: DNR   Consults: None  Family Communication: family was NOT present at bedside, at the time of interview.  Opportunity was given to ask question and all questions were answered satisfactorily.  Disposition: Admitted as inpatient, telemetry med-surge unit. Likely to be discharged [facility], in 2-3 days.  I have discussed plan of care as described above with RN and patient/family.  Severity of Illness: The appropriate patient status for this patient is INPATIENT. Inpatient status is judged to be reasonable and necessary in order to provide the required intensity of service to ensure the patient's safety. The patient's presenting symptoms, physical exam findings, and  initial radiographic and laboratory data in the context of their chronic comorbidities is felt to place them at high risk for further clinical deterioration. Furthermore, it is not anticipated that the patient will be medically stable for discharge from the hospital within 2 midnights of admission. The following factors support the patient status of inpatient.   " The patient's presenting symptoms include resp failure. " The worrisome physical exam findings include shortness of breath. " The initial radiographic and laboratory data are worrisome because of elevated WBC count. " The chronic co-morbidities include old age.   * I certify that  at the point of admission it is my clinical judgment that the patient will require inpatient hospital care spanning beyond 2 midnights from the point of admission due to high intensity of service, high risk for further deterioration and high frequency of surveillance required.*    Author: Gillis Santa, MD [facility] 12/12/2020 5:47 PM   To reach On-call, see care teams to locate the attending and reach out to them via [email or web address]. If 7PM-7AM, please contact night-coverage If you still have difficulty reaching the attending provider, please page the [facility] (Director on Call) for [facility] on amion for assistance.

## 2020-12-12 NOTE — Sepsis Progress Note (Signed)
Code sepsis protocol being monitored by elink  Notified provider of need to order lactic acid and fluid bolus.

## 2020-12-12 NOTE — ED Provider Notes (Signed)
.  Critical Care Performed by: Concha Se, MD Authorized by: Concha Se, MD   Critical care provider statement:    Critical care time (minutes):  45   Critical care was necessary to treat or prevent imminent or life-threatening deterioration of the following conditions:  Respiratory failure   Critical care was time spent personally by me on the following activities:  Discussions with consultants, evaluation of patient's response to treatment, examination of patient, ordering and performing treatments and interventions, ordering and review of laboratory studies, ordering and review of radiographic studies, pulse oximetry, re-evaluation of patient's condition, obtaining history from patient or surrogate and review of old charts .1-3 Lead EKG Interpretation Performed by: Concha Se, MD Authorized by: Concha Se, MD     Interpretation: normal     ECG rate:  80s    ECG rate assessment: normal     Rhythm: sinus rhythm     Ectopy: none     Conduction: normal       5:18 PM  Patient CT scan is consistent with pneumonia.  I went to reevaluate patient and took her off of her oxygen that was initially placed for desaturations with the fentanyl however has been a long time since the fentanyl and off the oxygen on room air she sats 84 to 85%.  Patient does report shortness of breath as well as a cough recently.  I placed patient back on 3 L with sats around 92%.  Patient does report having her Covid vaccines but will get Covid swab.  Denies any prior history of lung issues and her CT scan does not show any evidence of PE.  Patient's respiratory rate was elevated and her white count was significantly elevated therefore sepsis alert was called and patient was started on antibiotics for pneumonia.  We will discussed the hospital team for admission    Concha Se, MD 12/12/20 1719

## 2020-12-12 NOTE — ED Notes (Addendum)
[facility] contacted to notify them of patient pending admission. No answer received.

## 2020-12-12 NOTE — ED Notes (Signed)
Zithromax received from pharmacy. Zithromax sent up with patient.

## 2020-12-12 NOTE — ED Notes (Signed)
No ED float RN available at this time, transport contacted for patient.

## 2020-12-12 NOTE — ED Notes (Signed)
Patient transported to inpatient unit at this time.

## 2020-12-12 NOTE — ED Notes (Signed)
X-ray at bedside

## 2020-12-12 NOTE — ED Notes (Signed)
Pt resting comfortably at this time. NAD noted. Call bell in reach.  

## 2020-12-12 NOTE — ED Notes (Addendum)
Pt presents to ED with c/o of having neck and back pain. Pt comes from [facility]. Pt states this started yesterday but did not tell staff until this morning. Pt denies injury, fall, or trauma at this time. Pt states she was rolling over in bed and heard a "pop". Pt states pain on inspiration but denies hurting her ribs and denies any previous rib FX's. Pt has dementia but is oriented to person place and reason she is here, but does not know the year at this time. NAD noted.

## 2020-12-12 NOTE — ED Notes (Signed)
Patient updated on inpatient bed assignment and pending transport after report. Patient denies needs.

## 2020-12-12 NOTE — ED Notes (Signed)
[facility] updated on POC. Admitting provider at bedside.

## 2020-12-12 NOTE — ED Notes (Signed)
ED Provider at bedside. Meal tray provided to patient.

## 2020-12-13 DIAGNOSIS — J9621 Acute and chronic respiratory failure with hypoxia: Secondary | ICD-10-CM | POA: Diagnosis not present

## 2020-12-13 LAB — CBC
HCT: 33.5 % — ABNORMAL LOW (ref 36.0–46.0)
Hemoglobin: 10.8 g/dL — ABNORMAL LOW (ref 12.0–15.0)
MCH: 29.8 pg (ref 26.0–34.0)
MCHC: 32.2 g/dL (ref 30.0–36.0)
MCV: 92.5 fL (ref 80.0–100.0)
Platelets: 476 10*3/uL — ABNORMAL HIGH (ref 150–400)
RBC: 3.62 MIL/uL — ABNORMAL LOW (ref 3.87–5.11)
RDW: 13.3 % (ref 11.5–15.5)
WBC: 14.2 10*3/uL — ABNORMAL HIGH (ref 4.0–10.5)
nRBC: 0 % (ref 0.0–0.2)

## 2020-12-13 LAB — BASIC METABOLIC PANEL
Anion gap: 6 (ref 5–15)
BUN: 15 mg/dL (ref 8–23)
CO2: 23 mmol/L (ref 22–32)
Calcium: 8.5 mg/dL — ABNORMAL LOW (ref 8.9–10.3)
Chloride: 108 mmol/L (ref 98–111)
Creatinine, Ser: 0.55 mg/dL (ref 0.44–1.00)
GFR, Estimated: >60 mL/min (ref >60)
Glucose, Bld: 89 mg/dL (ref 70–99)
Potassium: 3.7 mmol/L (ref 3.5–5.1)
Sodium: 137 mmol/L (ref 135–145)

## 2020-12-13 LAB — STREP PNEUMONIAE URINARY ANTIGEN: Strep Pneumo Urinary Antigen: NEGATIVE

## 2020-12-13 LAB — PHOSPHORUS: Phosphorus: 3.3 mg/dL (ref 2.5–4.6)

## 2020-12-13 LAB — MAGNESIUM: Magnesium: 1.2 mg/dL — ABNORMAL LOW (ref 1.7–2.4)

## 2020-12-13 MED ORDER — MAGNESIUM SULFATE 2 GM/50ML IV SOLN
2.0000 g | INTRAVENOUS | Status: AC
  Administered 2020-12-13 (×2): 2 g via INTRAVENOUS
  Filled 2020-12-13 (×2): qty 50

## 2020-12-13 NOTE — Progress Notes (Signed)
[facility] Progress Note  Patient: [name]    TFT:[id number]  DOA: 12/12/2020     Date of Service: the patient was seen and examined on 12/13/2020  Chief Complaint  Patient presents with  . Back Pain  . Neck Pain   Brief hospital course: [name] is a 80 y.o. female with Past medical history of Alzheimer's dementia, depression presented from [facility] with complaining of back pain and neck pain, patient rolled over at nighttime when she felt a pop did not tell the staff until today morning after breakfast.  Patient was given fentanyl and she was having respiratory failure in the ED.  On further questioning patient said that she was having cough and shortness of breath as well.  ED Course:  RR 31, hypoxic respiratory failure, leukocytosis WBC 22.7  CXR New basilar lung densities particularly on the left side. Findings could be related to atelectasis and small amount of pleural fluid. Difficult to exclude infection  CTA: No definite evidence of pulmonary embolus. Small left pleural effusion is noted with adjacent subsegmental atelectasis. Right posterior upper lobe opacity is noted concerning for atelectasis or possibly infiltrate. Small sliding-type hiatal hernia. 6 mm nodule is noted anteriorly in the right lower lobe. Non-contrast chest CT at 6-12 months is recommended. If the nodule is stable at time of repeat CT, then future CT at 18-24 months    At her baseline AAO x3, not aware of year and month, h/o Alzheimer's dementia    Assessment and Plan: Principal Problem:   Acute on chronic respiratory failure with hypoxia (HCC) Active Problems:   SOB (shortness of breath)   Community acquired pneumonia   # Acute upper respiratory failure secondary to community-acquired pneumonia Continue supplemental oxygenation and gradually wean off Continue aspiration precaution Continue DuoNeb every 6 hourly as needed   # Community-acquired  pneumonia CXR and CTA reviewed as above Patient was given ceftriaxone and azithromycin in the ED which has been continued Mucinex twice daily Robitussin-DM as needed Trend WBC count Monitor temperature curve Follow blood culture and respiratory viral panel Urine for strep and Legionella antigen   # Chest wall pain left-sided tenderness, musculoskeletal pain Continue Voltaren gel and Tylenol as needed  # Dementia and depression We will resume home medications after review by pharmacy   # Hypomagnesemia, mag repleted.    Body mass index is 16.6 kg/m.  Interventions:    Pressure Injury 09/25/19 Sacrum Mid;Left Stage 1 -  Intact skin with non-blanchable redness of a localized area usually over a bony prominence. non-blanchable redness to sacrum (Active)  09/25/19 0000  Location: Sacrum  Location Orientation: Mid;Left  Staging: Stage 1 -  Intact skin with non-blanchable redness of a localized area usually over a bony prominence.  Wound Description (Comments): non-blanchable redness to sacrum  Present on Admission:      Diet: Regular diet DVT Prophylaxis: Subcutaneous Lovenox   Advance goals of care discussion: DNR  Family Communication: family was NOT present at bedside, at the time of interview.  The pt provided permission to discuss medical plan with the family. Opportunity was given to ask question and all questions were answered satisfactorily.   Disposition:  [facility] is from [facility], admitted with pneumonia, respiratory failure, still has respiratory failure, which precludes a safe discharge. Discharge to SNF, when respiratory failure will resolve, most likely in 1 to 2 days.  Subjective: No overnight issues, patient feels improvement in the shortness of breath, still has mild cough with  phlegm production.  Currently patient is on 3 L oxygen via nasal cannula.  Patient denied any other active issues. Left-sided chest wall tenderness is improving with topical  gel.   Physical Exam: General:  alert oriented to place and person.  Appear in no distress, affect appropriate Eyes: PERRLA ENT: Oral Mucosa Clear, moist  Neck: no JVD,  Cardiovascular: S1 and S2 Present, no Murmur,  Respiratory: good respiratory effort, Bilateral Air entry equal and Decreased, mild  Crackles b/l, no wheezes Abdomen: Bowel Sound present, Soft and no tenderness,  Skin: no rashes Extremities: no Pedal edema, no calf tenderness Neurologic: without any new focal findings Gait not checked due to patient safety concerns  Vitals:   12/12/20 1859 12/12/20 2334 12/13/20 0507 12/13/20 0731  BP: 123/65 (!) 105/57 (!) 108/56 115/60  Pulse: 78 84 80 79  Resp:  16 16 16   Temp: 97.7 F (36.5 C) 97.7 F (36.5 C) 97.8 F (36.6 C) 98.3 F (36.8 C)  TempSrc: Oral Oral Oral Oral  SpO2: 99% 97% 96% 96%  Weight:      Height:        Intake/Output Summary (Last 24 hours) at 12/13/2020 1136 Last data filed at 12/13/2020 1013 Gross per 24 hour  Intake 610.57 ml  Output --  Net 610.57 ml   Filed Weights   12/12/20 0907  Weight: 48.1 kg    Data Reviewed: I have personally reviewed and interpreted daily labs, tele strips, imagings as discussed above. I reviewed all nursing notes, pharmacy notes, vitals, pertinent old records I have discussed plan of care as described above with RN and patient/family.  CBC: Recent Labs  Lab 12/12/20 0918 12/13/20 0744  WBC 22.7* 14.2*  HGB 12.1 10.8*  HCT 38.6 33.5*  MCV 91.7 92.5  PLT 493* 476*   Basic Metabolic Panel: Recent Labs  Lab 12/12/20 0918 12/13/20 0744  NA 138 137  K 3.8 3.7  CL 104 108  CO2 22 23  GLUCOSE 148* 89  BUN 19 15  CREATININE 0.67 0.55  CALCIUM 9.6 8.5*  MG  --  1.2*  PHOS  --  3.3    Studies: No results found.  Scheduled Meds: . aspirin  81 mg Oral Daily  . diclofenac Sodium  2 g Topical QID  . enoxaparin (LOVENOX) injection  40 mg Subcutaneous Q24H  . guaiFENesin  600 mg Oral BID  .  ondansetron  4 mg Oral Once  . pantoprazole  40 mg Oral Daily  . polyethylene glycol  17 g Oral QHS   Continuous Infusions: . sodium chloride 50 mL/hr at 12/13/20 0416  . azithromycin Stopped (12/12/20 2313)  . cefTRIAXone (ROCEPHIN)  IV Stopped (12/12/20 1802)  . magnesium sulfate bolus IVPB 2 g (12/13/20 1027)   PRN Meds: acetaminophen, guaiFENesin, HYDROcodone-acetaminophen, ipratropium-albuterol  Time spent: 35 minutes  Author: 02/12/21. MD [facility] 12/13/2020 11:36 AM  To reach On-call, see care teams to locate the attending and reach out to them via [email or web address]. If 7PM-7AM, please contact night-coverage If you still have difficulty reaching the attending provider, please page the [facility] (Director on Call) for [facility] on amion for assistance.

## 2020-12-13 NOTE — Evaluation (Signed)
Clinical/Bedside Swallow Evaluation Patient Details  Name: [name] MRN: [id number] Date of Birth: 1940-11-02  Today's Date: 12/13/2020 Time: SLP Start Time (ACUTE ONLY): 1000 SLP Stop Time (ACUTE ONLY): 1040 SLP Time Calculation (min) (ACUTE ONLY): 40 min  Past Medical History:  Past Medical History:  Diagnosis Date  . Alzheimer's dementia (HCC)   . Insomnia   . Low back pain   . Vomiting    Past Surgical History:  Past Surgical History:  Procedure Laterality Date  . HIP ARTHROPLASTY Left 09/22/2019   Procedure: ARTHROPLASTY BIPOLAR HIP (HEMIARTHROPLASTY) RIGHT;  Surgeon: Christena Flake, MD;  Location: [facility] ORS;  Service: Orthopedics;  Laterality: Left;  Marland Kitchen VAGINAL HYSTERECTOMY     HPI:  Per admitting H&P "[name] is a 80 y.o. female with Past medical history of Alzheimer's dementia, depression presented from [facility] with complaining of back pain and neck pain, patient rolled over at nighttime when she felt a pop did not tell the staff until today morning after breakfast.  Patient was given fentanyl and she was having respiratory failure in the ED.  On further questioning patient said that she was having cough and shortness of breath as well."   Assessment / Plan / Recommendation Clinical Impression  Pt presents with mostly functional swallowing abilities at bedside with no overt s/s of aspiration with any tested consistencies. Oral mech exam revealed structures to be functioning adequately with no apparent weakness. Pt has no upper teeth and reports her upper partials broke a few years ago. Oral tranisit delay with solids but Pt cleared oral cavity adequately with minimal residue after the swallow. Vocal quality remained clear, laryngeal elevation appeared adequate. Pt did report having difficulty swallowing pills with them getting "stuck" in her throat. Encouraged meds with applesauce. Pt also reports occasionally coughing when drinking but none noted today. Rec Dys  3 diet with chopped meats. Conitnue with thin liquids, single sips and sitting fully upright. ST to f/u with toleration of diet. Precautions discussed with Pt, Nsg and posted above bed. Prognosis good. SLP Visit Diagnosis: Dysphagia, oropharyngeal phase (R13.12)    Aspiration Risk  Mild aspiration risk    Diet Recommendation Dysphagia 3 (Mech soft)   Liquid Administration via: Cup;Straw Medication Administration: Whole meds with puree Supervision: Patient able to self feed Compensations: Slow rate;Small sips/bites Postural Changes: Seated upright at 90 degrees;Remain upright for at least 30 minutes after po intake    Other  Recommendations  N/a   Follow up Recommendations Return to [facility]      Frequency and Duration  (F/U with tol diet) x1         Prognosis   Good      Swallow Study   General Date of Onset: 12/12/20 HPI: Per admitting H&P "[name] is a 80 y.o. female with Past medical history of Alzheimer's dementia, depression presented from [facility] with complaining of back pain and neck pain, patient rolled over at nighttime when she felt a pop did not tell the staff until today morning after breakfast.  Patient was given fentanyl and she was having respiratory failure in the ED.  On further questioning patient said that she was having cough and shortness of breath as well." Type of Study: Bedside Swallow Evaluation Diet Prior to this Study: Regular Temperature Spikes Noted: No Respiratory Status: Nasal cannula History of Recent Intubation: No Behavior/Cognition: Alert;Cooperative;Pleasant mood Oral Cavity Assessment: Within Functional Limits Oral Care Completed by SLP: No Oral  Cavity - Dentition: Other (Comment) (No upper teeth. reports upper partial broke) Vision: Functional for self-feeding Self-Feeding Abilities: Able to feed self Patient Positioning: Upright in bed Baseline Vocal Quality: Normal;Hoarse Volitional Swallow: Able to elicit     Oral/Motor/Sensory Function Overall Oral Motor/Sensory Function: Within functional limits   Ice Chips Ice chips: Not tested   Thin Liquid Thin Liquid: Within functional limits Presentation: Cup;Spoon;Straw (8 sips)    Nectar Thick Nectar Thick Liquid: Not tested   Honey Thick Honey Thick Liquid: Not tested   Puree Puree: Within functional limits   Solid     Solid: Impaired Presentation: Self Fed Oral Phase Functional Implications: Prolonged oral transit      Eather Colas 12/13/2020,10:52 AM

## 2020-12-14 DIAGNOSIS — J9621 Acute and chronic respiratory failure with hypoxia: Secondary | ICD-10-CM | POA: Diagnosis not present

## 2020-12-14 LAB — CBC
HCT: 32 % — ABNORMAL LOW (ref 36.0–46.0)
Hemoglobin: 9.9 g/dL — ABNORMAL LOW (ref 12.0–15.0)
MCH: 29.5 pg (ref 26.0–34.0)
MCHC: 30.9 g/dL (ref 30.0–36.0)
MCV: 95.2 fL (ref 80.0–100.0)
Platelets: 494 10*3/uL — ABNORMAL HIGH (ref 150–400)
RBC: 3.36 MIL/uL — ABNORMAL LOW (ref 3.87–5.11)
RDW: 13.3 % (ref 11.5–15.5)
WBC: 11.8 10*3/uL — ABNORMAL HIGH (ref 4.0–10.5)
nRBC: 0 % (ref 0.0–0.2)

## 2020-12-14 LAB — BASIC METABOLIC PANEL
Anion gap: 4 — ABNORMAL LOW (ref 5–15)
BUN: 18 mg/dL (ref 8–23)
CO2: 26 mmol/L (ref 22–32)
Calcium: 8.6 mg/dL — ABNORMAL LOW (ref 8.9–10.3)
Chloride: 111 mmol/L (ref 98–111)
Creatinine, Ser: 0.47 mg/dL (ref 0.44–1.00)
GFR, Estimated: >60 mL/min (ref >60)
Glucose, Bld: 87 mg/dL (ref 70–99)
Potassium: 3.9 mmol/L (ref 3.5–5.1)
Sodium: 141 mmol/L (ref 135–145)

## 2020-12-14 LAB — MAGNESIUM: Magnesium: 1.8 mg/dL (ref 1.7–2.4)

## 2020-12-14 LAB — LEGIONELLA PNEUMOPHILA SEROGP 1 UR AG: L. pneumophila Serogp 1 Ur Ag: NEGATIVE

## 2020-12-14 LAB — PHOSPHORUS: Phosphorus: 2.5 mg/dL (ref 2.5–4.6)

## 2020-12-14 MED ORDER — LACTULOSE 10 GM/15ML PO SOLN
20.0000 g | Freq: Every day | ORAL | Status: DC
  Administered 2020-12-14 – 2020-12-16 (×3): 20 g via ORAL
  Filled 2020-12-14 (×3): qty 30

## 2020-12-14 MED ORDER — DIVALPROEX SODIUM 125 MG PO DR TAB
125.0000 mg | DELAYED_RELEASE_TABLET | Freq: Two times a day (BID) | ORAL | Status: DC
  Administered 2020-12-14 – 2020-12-16 (×5): 125 mg via ORAL
  Filled 2020-12-14 (×5): qty 1

## 2020-12-14 MED ORDER — HYDROXYZINE HCL 25 MG PO TABS
25.0000 mg | ORAL_TABLET | Freq: Three times a day (TID) | ORAL | Status: DC | PRN
  Administered 2020-12-15 – 2020-12-16 (×3): 25 mg via ORAL
  Filled 2020-12-14 (×5): qty 1

## 2020-12-14 MED ORDER — TAMSULOSIN HCL 0.4 MG PO CAPS
0.4000 mg | ORAL_CAPSULE | Freq: Every day | ORAL | Status: DC
  Administered 2020-12-14 – 2020-12-16 (×3): 0.4 mg via ORAL
  Filled 2020-12-14 (×3): qty 1

## 2020-12-14 MED ORDER — MIRTAZAPINE 30 MG PO TABS
30.0000 mg | ORAL_TABLET | Freq: Every day | ORAL | Status: DC
  Administered 2020-12-14 – 2020-12-16 (×3): 30 mg via ORAL
  Filled 2020-12-14 (×3): qty 2

## 2020-12-14 NOTE — Progress Notes (Signed)
[facility] Progress Note  Patient: [name]    NWG:[id number]  DOA: 12/12/2020     Date of Service: the patient was seen and examined on 12/14/2020  Chief Complaint  Patient presents with  . Back Pain  . Neck Pain   Brief hospital course: [name] is a 80 y.o. female with Past medical history of Alzheimer's dementia, depression presented from [facility] with complaining of back pain and neck pain, patient rolled over at nighttime when she felt a pop did not tell the staff until today morning after breakfast.  Patient was given fentanyl and she was having respiratory failure in the ED.  On further questioning patient said that she was having cough and shortness of breath as well.  ED Course:  RR 31, hypoxic respiratory failure, leukocytosis WBC 22.7  CXR New basilar lung densities particularly on the left side. Findings could be related to atelectasis and small amount of pleural fluid. Difficult to exclude infection  CTA: No definite evidence of pulmonary embolus. Small left pleural effusion is noted with adjacent subsegmental atelectasis. Right posterior upper lobe opacity is noted concerning for atelectasis or possibly infiltrate. Small sliding-type hiatal hernia. 6 mm nodule is noted anteriorly in the right lower lobe. Non-contrast chest CT at 6-12 months is recommended. If the nodule is stable at time of repeat CT, then future CT at 18-24 months    At her baseline AAO x3, not aware of year and month, h/o Alzheimer's dementia    Assessment and Plan: Principal Problem:   Acute on chronic respiratory failure with hypoxia (HCC) Active Problems:   SOB (shortness of breath)   Community acquired pneumonia   # Acute upper respiratory failure secondary to community-acquired pneumonia Continue supplemental oxygenation and gradually wean off Continue aspiration precaution Continue DuoNeb every 6 hourly as needed 4/3 RN was advised to check O2  saturation on room air   # Community-acquired pneumonia CXR and CTA reviewed as above Patient was given ceftriaxone and azithromycin in the ED which has been continued Mucinex twice daily Robitussin-DM as needed Trend WBC count Monitor temperature curve Follow blood culture and respiratory viral panel Urine for strep negative, Legionella antigen still in process   # Chest wall pain left-sided tenderness, musculoskeletal pain Continue Voltaren gel and Tylenol as needed  # Dementia and depression Resumed Depakote and Remeron PTA dose   # Hypomagnesemia, mag repleted.    Body mass index is 16.6 kg/m.  Interventions:    Pressure Injury 09/25/19 Sacrum Mid;Left Stage 1 -  Intact skin with non-blanchable redness of a localized area usually over a bony prominence. non-blanchable redness to sacrum (Active)  09/25/19 0000  Location: Sacrum  Location Orientation: Mid;Left  Staging: Stage 1 -  Intact skin with non-blanchable redness of a localized area usually over a bony prominence.  Wound Description (Comments): non-blanchable redness to sacrum  Present on Admission:      Diet: Regular diet DVT Prophylaxis: Subcutaneous Lovenox   Advance goals of care discussion: DNR  Family Communication: family was NOT present at bedside, at the time of interview.  The pt provided permission to discuss medical plan with the family. Opportunity was given to ask question and all questions were answered satisfactorily.   Disposition:  [facility] is from [facility], admitted with pneumonia, respiratory failure, still has respiratory failure, which precludes a safe discharge. Discharge to SNF, when respiratory failure will resolve, most likely in 1 to 2 days. Currently patient is on 3 L oxygen  via nasal cannula, RN was advised to check O2 saturation on room air   Subjective: No overnight issues, patient stated her breathing is shallow, denies any other active issues but she overall does not feel good,  feels depressed.  Denied any chest pain or palpitations, no abdominal pain.    Physical Exam: General:  alert oriented to place and person.  Appear in no distress, affect flat/depressed mood Eyes: PERRLA ENT: Oral Mucosa Clear, moist  Neck: no JVD,  Cardiovascular: S1 and S2 Present, no Murmur,  Respiratory: good respiratory effort, Bilateral Air entry equal and Decreased, mild  Crackles b/l, no wheezes Abdomen: Bowel Sound present, Soft and no tenderness,  Skin: no rashes Extremities: no Pedal edema, no calf tenderness Neurologic: without any new focal findings Gait not checked due to patient safety concerns  Vitals:   12/13/20 1900 12/14/20 0014 12/14/20 0504 12/14/20 0905  BP: (!) 107/49 (!) 101/47 104/63 (!) 103/54  Pulse: 81 72 77 82  Resp: 14 16 16 16   Temp: 99.3 F (37.4 C) 99.2 F (37.3 C) 99.2 F (37.3 C) 98.8 F (37.1 C)  TempSrc: Oral   Oral  SpO2: 99% 97% 98% 97%  Weight:      Height:        Intake/Output Summary (Last 24 hours) at 12/14/2020 1034 Last data filed at 12/14/2020 0601 Gross per 24 hour  Intake 1505.58 ml  Output 790 ml  Net 715.58 ml   Filed Weights   12/12/20 0907  Weight: 48.1 kg    Data Reviewed: I have personally reviewed and interpreted daily labs, tele strips, imagings as discussed above. I reviewed all nursing notes, pharmacy notes, vitals, pertinent old records I have discussed plan of care as described above with RN and patient/family.  CBC: Recent Labs  Lab 12/12/20 0918 12/13/20 0744 12/14/20 0431  WBC 22.7* 14.2* 11.8*  HGB 12.1 10.8* 9.9*  HCT 38.6 33.5* 32.0*  MCV 91.7 92.5 95.2  PLT 493* 476* 494*   Basic Metabolic Panel: Recent Labs  Lab 12/12/20 0918 12/13/20 0744 12/14/20 0431  NA 138 137 141  K 3.8 3.7 3.9  CL 104 108 111  CO2 22 23 26   GLUCOSE 148* 89 87  BUN 19 15 18   CREATININE 0.67 0.55 0.47  CALCIUM 9.6 8.5* 8.6*  MG  --  1.2* 1.8  PHOS  --  3.3 2.5    Studies: No results found.  Scheduled  Meds: . aspirin  81 mg Oral Daily  . diclofenac Sodium  2 g Topical QID  . enoxaparin (LOVENOX) injection  40 mg Subcutaneous Q24H  . guaiFENesin  600 mg Oral BID  . ondansetron  4 mg Oral Once  . pantoprazole  40 mg Oral Daily  . polyethylene glycol  17 g Oral QHS   Continuous Infusions: . sodium chloride 50 mL/hr at 12/14/20 0314  . azithromycin Stopped (12/13/20 1839)  . cefTRIAXone (ROCEPHIN)  IV Stopped (12/13/20 1701)   PRN Meds: acetaminophen, guaiFENesin, HYDROcodone-acetaminophen, ipratropium-albuterol  Time spent: 35 minutes  Author: 02/13/21. MD [facility] 12/14/2020 10:34 AM  To reach On-call, see care teams to locate the attending and reach out to them via [email or web address]. If 7PM-7AM, please contact night-coverage If you still have difficulty reaching the attending provider, please page the [facility] (Director on Call) for [facility] on amion for assistance.

## 2020-12-15 DIAGNOSIS — J9621 Acute and chronic respiratory failure with hypoxia: Secondary | ICD-10-CM | POA: Diagnosis not present

## 2020-12-15 DIAGNOSIS — F03918 Unspecified dementia, unspecified severity, with other behavioral disturbance: Secondary | ICD-10-CM | POA: Insufficient documentation

## 2020-12-15 DIAGNOSIS — F319 Bipolar disorder, unspecified: Secondary | ICD-10-CM | POA: Insufficient documentation

## 2020-12-15 DIAGNOSIS — F0391 Unspecified dementia with behavioral disturbance: Secondary | ICD-10-CM | POA: Insufficient documentation

## 2020-12-15 LAB — BASIC METABOLIC PANEL
Anion gap: 4 — ABNORMAL LOW (ref 5–15)
BUN: 15 mg/dL (ref 8–23)
CO2: 25 mmol/L (ref 22–32)
Calcium: 8.5 mg/dL — ABNORMAL LOW (ref 8.9–10.3)
Chloride: 111 mmol/L (ref 98–111)
Creatinine, Ser: 0.56 mg/dL (ref 0.44–1.00)
GFR, Estimated: >60 mL/min (ref >60)
Glucose, Bld: 80 mg/dL (ref 70–99)
Potassium: 4 mmol/L (ref 3.5–5.1)
Sodium: 140 mmol/L (ref 135–145)

## 2020-12-15 LAB — CBC
HCT: 33.5 % — ABNORMAL LOW (ref 36.0–46.0)
Hemoglobin: 10.4 g/dL — ABNORMAL LOW (ref 12.0–15.0)
MCH: 29 pg (ref 26.0–34.0)
MCHC: 31 g/dL (ref 30.0–36.0)
MCV: 93.3 fL (ref 80.0–100.0)
Platelets: 531 10*3/uL — ABNORMAL HIGH (ref 150–400)
RBC: 3.59 MIL/uL — ABNORMAL LOW (ref 3.87–5.11)
RDW: 12.9 % (ref 11.5–15.5)
WBC: 8.3 10*3/uL (ref 4.0–10.5)
nRBC: 0 % (ref 0.0–0.2)

## 2020-12-15 LAB — AMMONIA: Ammonia: 22 umol/L (ref 9–35)

## 2020-12-15 LAB — MRSA PCR SCREENING: MRSA by PCR: NEGATIVE

## 2020-12-15 LAB — GLUCOSE, CAPILLARY: Glucose-Capillary: 73 mg/dL (ref 70–99)

## 2020-12-15 NOTE — TOC Initial Note (Signed)
Transition of Care [facility]) - Initial/Assessment Note    Patient Details  Name: [name] MRN: [id number] Date of Birth: 1941/07/18  Transition of Care [facility] CM/SW Contact:    Allayne Butcher, RN Phone Number: 12/15/2020, 2:45 PM  Clinical Narrative:                 Patient admitted to the hospital with pneumonia on acute O2 at 2L.  Patient is from [facility].  Patient is under [facility] guardianship since 2008.  Plan is for discharge back to [facility], Legal guardian has been updated.  RNCM has also reached out to [facility] to let them know patient may be returning soon and may also need oxygen at the facility.   Per legal guardian and [facility] patient is mostly wheelchair bound at baseline.  PT is recommending home health.  Barbara Cower with [facility] will accept referral for PT and OT.   Potential for discharge tomorrow.    Expected Discharge Plan: Assisted Living Barriers to Discharge: Continued Medical Work up   Patient Goals and CMS Choice Patient states their goals for this hospitalization and ongoing recovery are:: Plan to return to [facility]- discussed with Legal guardian CMS Medicare.gov Compare Post Acute Care list provided to:: Legal Guardian Choice offered to / list presented to : [facility] POA / Guardian  Expected Discharge Plan and Services Expected Discharge Plan: Assisted Living   Discharge Planning Services: CM Consult Post Acute Care Choice: Home Health Living arrangements for the past 2 months: Assisted Living Facility                                      Prior Living Arrangements/Services Living arrangements for the past 2 months: Assisted Living Facility Lives with:: Facility Resident Patient language and need for interpreter reviewed:: Yes Do you feel safe going back to the place where you live?: Yes      Need for Family Participation in Patient Care: Yes (Comment) (dementia) Care giver support system in place?:  Yes (comment) (legal guardian and ALF) Current home services: DME (wheelchair, walker) Criminal Activity/Legal Involvement Pertinent to Current Situation/Hospitalization: No - Comment as needed  Activities of Daily Living      Permission Sought/Granted Permission sought to share information with : Case Manager,Facility Contact Representative,Guardian Permission granted to share information with : Yes, Verbal Permission Granted  Share Information with NAME: Rykiell Turner Doctor, hospital Guardian)  Permission granted to share info w AGENCY: [facility]        Emotional Assessment Appearance:: Appears stated age Attitude/Demeanor/Rapport: Crying Affect (typically observed): Anxious Orientation: : Oriented to Self,Oriented to Place Alcohol / Substance Use: Not Applicable Psych Involvement: No (comment)  Admission diagnosis:  SOB (shortness of breath) [R06.02] Acute respiratory failure with hypoxia (HCC) [J96.01] Community acquired pneumonia of right lung, unspecified part of lung [J18.9] Sepsis, due to unspecified organism, unspecified whether acute organ dysfunction present [facility]) [A41.9] Patient Active Problem List   Diagnosis Date Noted  . Bipolar disorder (HCC) 12/15/2020  . Dementia with behavioral disturbance (HCC) 12/15/2020  . SOB (shortness of breath) 12/12/2020  . Acute on chronic respiratory failure with hypoxia (HCC) 12/12/2020  . Community acquired pneumonia 12/12/2020  . Pressure injury of skin 09/25/2019  . Fall   . Closed left subtrochanteric femur fracture (HCC) 09/21/2019   PCP:  Patient, No Pcp Per (Inactive) Pharmacy:  No Pharmacies Listed  Social Determinants of Health (SDOH) Interventions    Readmission Risk Interventions No flowsheet data found.

## 2020-12-15 NOTE — Care Management Important Message (Signed)
Important Message  Patient Details  Name: [name] MRN: [id number] Date of Birth: Sep 12, 1941   Medicare Important Message Given:  Yes  Talked with HCPOA, [name] by phone [phone]) and reviewed the Important Message from Medicare for [name].  She was in agreement with the discharge plan back to the facility.  Asked if she would like to have a copy of the form and she stated yes.  I sent securely via email to: [email or web address] and thanked her for her time.   Olegario Messier A Heather Mckendree 12/15/2020, 1:18 PM

## 2020-12-15 NOTE — Progress Notes (Addendum)
[facility] Progress Note  Patient: [name]    RKY:[id number]  DOA: 12/12/2020     Date of Service: the patient was seen and examined on 12/15/2020  Chief Complaint  Patient presents with  . Back Pain  . Neck Pain   Brief hospital course: [name] is a 80 y.o. female with Past medical history of Alzheimer's dementia, depression presented from [facility] with complaining of back pain and neck pain, patient rolled over at nighttime when she felt a pop did not tell the staff until today morning after breakfast.  Patient was given fentanyl and she was having respiratory failure in the ED.  On further questioning patient said that she was having cough and shortness of breath as well.  ED Course:  RR 31, hypoxic respiratory failure, leukocytosis WBC 22.7  CXR New basilar lung densities particularly on the left side. Findings could be related to atelectasis and small amount of pleural fluid. Difficult to exclude infection  CTA: No definite evidence of pulmonary embolus. Small left pleural effusion is noted with adjacent subsegmental atelectasis. Right posterior upper lobe opacity is noted concerning for atelectasis or possibly infiltrate. Small sliding-type hiatal hernia. 6 mm nodule is noted anteriorly in the right lower lobe. Non-contrast chest CT at 6-12 months is recommended. If the nodule is stable at time of repeat CT, then future CT at 18-24 months    At her baseline AAO x3, not aware of year and month, h/o Alzheimer's dementia    Assessment and Plan: Principal Problem:   Acute on chronic respiratory failure with hypoxia (HCC) Active Problems:   SOB (shortness of breath)   Community acquired pneumonia   # Acute upper respiratory failure secondary to community-acquired pneumonia Has been weaned to 2 L. Breathing comfortably. Some degree of atelectasis likely contributory. CTA neg. - RN to attempt to wean, may need to d/c on o2 - cont prn  duonebs  # Community-acquired pneumonia CXR and CTA reviewed revealing RLL pna. WBC normalized, clinically improving. Legionella and strep urine antigens neg. covid neg - continue rocephin/azithromycin (4/1> )  # Chest wall pain left-sided tenderness, musculoskeletal pain Continue Voltaren gel and Tylenol as needed  # Dementia and depression Resumed Depakote and Remeron PTA dose  # Hypomagnesemia Repleted   Body mass index is 16.6 kg/m.  Interventions:    Pressure Injury 09/25/19 Sacrum Mid;Left Stage 1 -  Intact skin with non-blanchable redness of a localized area usually over a bony prominence. non-blanchable redness to sacrum (Active)  09/25/19 0000  Location: Sacrum  Location Orientation: Mid;Left  Staging: Stage 1 -  Intact skin with non-blanchable redness of a localized area usually over a bony prominence.  Wound Description (Comments): non-blanchable redness to sacrum  Present on Admission:      Diet: Regular diet DVT Prophylaxis: Subcutaneous Lovenox   Advance goals of care discussion: DNR  Family Communication: family was NOT present at bedside, at the time of interview.    Disposition:  [facility] is from [facility], admitted with pneumonia, respiratory failure, still has respiratory failure, which precludes a safe discharge. Discharge to SNF, when respiratory failure will resolve, most likely in 1 to 2 days. Currently patient is on 2 L oxygen via nasal cannula, RN advised to check O2 saturation on room air   Subjective:  Patient feeling tired. Denies cough or fever. Has appetite but not eating much. No vomiting or diarrhea. No chest pain.     Physical Exam: General:  alert oriented to  place and person.  Appear in no distress, affect flat/depressed mood Eyes: PERRLA ENT: Oral Mucosa Clear, moist  Neck: no JVD,  Cardiovascular: S1 and S2 Present, no Murmur,  Respiratory: good respiratory effort, Bilateral Air entry equal and Decreased, mild  Crackles b/l at  bases Abdomen: Bowel Sound present, Soft and no tenderness,  Skin: no rashes Extremities: no Pedal edema, no calf tenderness Neurologic: without any new focal findings Gait not checked due to patient safety concerns  Vitals:   12/14/20 2219 12/14/20 2326 12/15/20 0558 12/15/20 0746  BP: (!) 126/55 (!) 113/53 124/62 (!) 112/55  Pulse: 74 73 69 71  Resp: 15 18 16 16   Temp: 99.1 F (37.3 C) 98.6 F (37 C) 98.1 F (36.7 C) 98.1 F (36.7 C)  TempSrc:      SpO2: 97% 97% 96% 96%  Weight:      Height:        Intake/Output Summary (Last 24 hours) at 12/15/2020 0835 Last data filed at 12/14/2020 1846 Gross per 24 hour  Intake 745.11 ml  Output 500 ml  Net 245.11 ml   Filed Weights   12/12/20 0907  Weight: 48.1 kg    Data Reviewed: I have personally reviewed and interpreted daily labs, tele strips, imagings as discussed above. I reviewed all nursing notes, pharmacy notes, vitals, pertinent old records I have discussed plan of care as described above with RN and patient/family.  CBC: Recent Labs  Lab 12/12/20 0918 12/13/20 0744 12/14/20 0431 12/15/20 0621  WBC 22.7* 14.2* 11.8* 8.3  HGB 12.1 10.8* 9.9* 10.4*  HCT 38.6 33.5* 32.0* 33.5*  MCV 91.7 92.5 95.2 93.3  PLT 493* 476* 494* 531*   Basic Metabolic Panel: Recent Labs  Lab 12/12/20 0918 12/13/20 0744 12/14/20 0431 12/15/20 0621  NA 138 137 141 140  K 3.8 3.7 3.9 4.0  CL 104 108 111 111  CO2 22 23 26 25   GLUCOSE 148* 89 87 80  BUN 19 15 18 15   CREATININE 0.67 0.55 0.47 0.56  CALCIUM 9.6 8.5* 8.6* 8.5*  MG  --  1.2* 1.8  --   PHOS  --  3.3 2.5  --     Studies: No results found.  Scheduled Meds: . aspirin  81 mg Oral Daily  . diclofenac Sodium  2 g Topical QID  . divalproex  125 mg Oral BID  . enoxaparin (LOVENOX) injection  40 mg Subcutaneous Q24H  . guaiFENesin  600 mg Oral BID  . lactulose  20 g Oral Daily  . mirtazapine  30 mg Oral Daily  . ondansetron  4 mg Oral Once  . pantoprazole  40 mg Oral  Daily  . polyethylene glycol  17 g Oral QHS  . tamsulosin  0.4 mg Oral Daily   Continuous Infusions: . sodium chloride 50 mL/hr at 12/14/20 1247  . azithromycin Stopped (12/15/20 0116)  . cefTRIAXone (ROCEPHIN)  IV Stopped (12/14/20 1622)   PRN Meds: acetaminophen, guaiFENesin, HYDROcodone-acetaminophen, hydrOXYzine, ipratropium-albuterol  Time spent: 35 minutes  Author: 02/13/21, MD [facility] 12/15/2020 8:35 AM  To reach On-call, see care teams to locate the attending and reach out to them via [email or web address]. If 7PM-7AM, please contact night-coverage If you still have difficulty reaching the attending provider, please page the [facility] (Director on Call) for [facility] on amion for assistance.

## 2020-12-15 NOTE — Progress Notes (Signed)
   12/15/20 1155  Clinical Encounter Type  Visited With Patient  Visit Type Spiritual support;Social support  Referral From Nurse  Consult/Referral To Chaplain   Chaplain received a spiritual consult from the nurse, stating the PT wanted prayer from the [place]. When Chaplain arrived the PT declined the visit.

## 2020-12-16 DIAGNOSIS — J9621 Acute and chronic respiratory failure with hypoxia: Secondary | ICD-10-CM | POA: Diagnosis not present

## 2020-12-16 LAB — BASIC METABOLIC PANEL
Anion gap: 8 (ref 5–15)
BUN: 12 mg/dL (ref 8–23)
CO2: 25 mmol/L (ref 22–32)
Calcium: 8.6 mg/dL — ABNORMAL LOW (ref 8.9–10.3)
Chloride: 108 mmol/L (ref 98–111)
Creatinine, Ser: 0.46 mg/dL (ref 0.44–1.00)
GFR, Estimated: >60 mL/min (ref >60)
Glucose, Bld: 89 mg/dL (ref 70–99)
Potassium: 3.8 mmol/L (ref 3.5–5.1)
Sodium: 141 mmol/L (ref 135–145)

## 2020-12-16 LAB — CBC
HCT: 30.8 % — ABNORMAL LOW (ref 36.0–46.0)
Hemoglobin: 9.7 g/dL — ABNORMAL LOW (ref 12.0–15.0)
MCH: 28.7 pg (ref 26.0–34.0)
MCHC: 31.5 g/dL (ref 30.0–36.0)
MCV: 91.1 fL (ref 80.0–100.0)
Platelets: 547 10*3/uL — ABNORMAL HIGH (ref 150–400)
RBC: 3.38 MIL/uL — ABNORMAL LOW (ref 3.87–5.11)
RDW: 12.7 % (ref 11.5–15.5)
WBC: 10.1 10*3/uL (ref 4.0–10.5)
nRBC: 0 % (ref 0.0–0.2)

## 2020-12-16 LAB — FERRITIN: Ferritin: 71 ng/mL (ref 11–307)

## 2020-12-16 LAB — PATHOLOGIST SMEAR REVIEW

## 2020-12-16 LAB — SAVE SMEAR(SSMR), FOR PROVIDER SLIDE REVIEW

## 2020-12-16 LAB — PHOSPHORUS: Phosphorus: 2.5 mg/dL (ref 2.5–4.6)

## 2020-12-16 LAB — MAGNESIUM: Magnesium: 1.2 mg/dL — ABNORMAL LOW (ref 1.7–2.4)

## 2020-12-16 MED ORDER — AMOXICILLIN 500 MG PO CAPS: 1000.0000 mg | ORAL_CAPSULE | Freq: Three times a day (TID) | ORAL | 2 refills | Status: DC

## 2020-12-16 MED ORDER — MAGNESIUM SULFATE 2 GM/50ML IV SOLN
2.0000 g | Freq: Once | INTRAVENOUS | Status: AC
  Administered 2020-12-16: 2 g via INTRAVENOUS
  Filled 2020-12-16: qty 50

## 2020-12-16 NOTE — TOC Transition Note (Signed)
Transition of Care [facility]) - CM/SW Discharge Note   Patient Details  Name: [name] MRN: [id number] Date of Birth: 1940-11-01  Transition of Care [facility]) CM/SW Contact:  Allayne Butcher, RN Phone Number: 12/16/2020, 9:31 AM   Clinical Narrative:    Patient is medically cleared for discharge back to [facility].  FL2 and Discharge summary faxed to [facility] at [phone].  Oxygen ordered from [facility].  Oxygen will need to be set up at facility before leaving the hospital.  [facility] will arrange transport once equipment is delivered.  [facility] arranged with [facility]  Barbara Cower with [facility] aware of discharge today.     Final next level of care: Assisted Living Barriers to Discharge: Barriers Resolved   Patient Goals and CMS Choice Patient states their goals for this hospitalization and ongoing recovery are:: Plan to return to [facility]- discussed with Legal guardian CMS Medicare.gov Compare Post Acute Care list provided to:: Legal Guardian Choice offered to / list presented to : [facility] POA / Guardian  Discharge Placement              Patient chooses bed at:  [facility]) Patient to be transferred to facility by: [facility] Name of family member notified: Pietro Cassis- Legal guardian Patient and family notified of of transfer: 12/16/20  Discharge Plan and Services   Discharge Planning Services: CM Consult Post Acute Care Choice: Home Health          DME Arranged: Oxygen DME Agency: [facility] Date DME Agency Contacted: 12/16/20 Time DME Agency Contacted: [phone] Representative spoke with at DME Agency: Ian Malkin HH Arranged: PT,OT HH Agency: [facility] Date HH Agency Contacted: 12/16/20 Time HH Agency Contacted: 0930 Representative spoke with at [facility] Agency: Barbara Cower  Social Determinants of Health (SDOH) Interventions     Readmission Risk Interventions No flowsheet data found.

## 2020-12-16 NOTE — Progress Notes (Signed)
SATURATION QUALIFICATIONS: (This note is used to comply with regulatory documentation for home oxygen)  Patient Saturations on Room Air at Rest = 88%   Patient Saturations on 1 Liters of oxygen while resting = 94%  Please briefly explain why patient needs home oxygen:

## 2020-12-16 NOTE — Progress Notes (Signed)
MD requested Rocephin be given early. Running it now at 0913. Patient tolerating it well.

## 2020-12-16 NOTE — Progress Notes (Signed)
Patient being transferred to 329 for discharge holding. Patient left with 1L of O2 on with no acute distress noted, Patient did receive morning medications prior to leaving. RN Lysle Morales received a report on the patient and verbalized understanding of her plan of care as well as her discharge plans.

## 2020-12-16 NOTE — [place] FL2 (Signed)
[place] MEDICAID FL2 LEVEL OF CARE SCREENING TOOL     IDENTIFICATION  Patient Name: [name] Birthdate: 08/17/1941 Sex: female Admission Date (Current Location): 12/12/2020  Campus and [place] Number:  Chiropodist and Address:  [facility], [address], [place], [place] [address]      Provider Number: [id number]  Attending Physician Name and Address:  Kathrynn Running, MD  Relative Name and Phone Number:  Pietro Cassis -Legal Guardian [phone]    Current Level of Care: Hospital Recommended Level of Care: Memory Care,Assisted Living Facility Prior Approval Number:    Date Approved/Denied:   PASRR Number:    Discharge Plan: Domiciliary (Rest home) (Memory Care ALF)    Current Diagnoses: Patient Active Problem List   Diagnosis Date Noted  . Bipolar disorder (HCC) 12/15/2020  . Dementia with behavioral disturbance (HCC) 12/15/2020  . SOB (shortness of breath) 12/12/2020  . Acute on chronic respiratory failure with hypoxia (HCC) 12/12/2020  . Community acquired pneumonia 12/12/2020  . Pressure injury of skin 09/25/2019  . Fall   . Closed left subtrochanteric femur fracture (HCC) 09/21/2019    Orientation RESPIRATION BLADDER Height & Weight     Self,Place,Situation  O2 (2L [place]) Incontinent Weight: 48.1 kg Height:  5\' 7"  (170.2 cm)  BEHAVIORAL SYMPTOMS/MOOD NEUROLOGICAL BOWEL NUTRITION STATUS      Incontinent Diet (Dysphagia diet 3)  AMBULATORY STATUS COMMUNICATION OF NEEDS Skin   Limited Assist Verbally PU Stage and Appropriate Care PU Stage 1 Dressing: Daily (foam dressing)                     Personal Care Assistance Level of Assistance  Bathing,Feeding,Dressing Bathing Assistance: Limited assistance Feeding assistance: Limited assistance Dressing Assistance: Limited assistance     Functional Limitations Info             SPECIAL CARE FACTORS FREQUENCY  Speech therapy     PT Frequency: home health  2 times per week OT Frequency: home health 2 times per week     Speech Therapy Frequency: home health 2 times per week      Contractures Contractures Info: Not present    Additional Factors Info  Code Status,Allergies Code Status Info: DNR Allergies Info: NKA           Current Medications (12/16/2020):  This is the current hospital active medication list Current Facility-Administered Medications  Medication Dose Route Frequency Provider Last Rate Last Admin  . 0.9 %  sodium chloride infusion   Intravenous Continuous 02/15/2021, MD 50 mL/hr at 12/16/20 0835 New Bag at 12/16/20 0835  . acetaminophen (TYLENOL) tablet 500 mg  500 mg Oral Q6H PRN 02/15/21, MD   500 mg at 12/15/20 2148  . aspirin chewable tablet 81 mg  81 mg Oral Daily 2149, MD   81 mg at 12/16/20 0853  . cefTRIAXone (ROCEPHIN) 2 g in sodium chloride 0.9 % 100 mL IVPB  2 g Intravenous Q24H 02/15/21, MD 200 mL/hr at 12/16/20 0859 IV Pump [employer] at 12/16/20 0859  . diclofenac Sodium (VOLTAREN) 1 % topical gel 2 g  2 g Topical QID 02/15/21, MD   2 g at 12/16/20 1123  . divalproex (DEPAKOTE) DR tablet 125 mg  125 mg Oral BID 02/15/21, MD   125 mg at 12/16/20 0855  . enoxaparin (LOVENOX) injection 40 mg  40 mg Subcutaneous Q24H 02/15/21, MD   40 mg at 12/15/20 2148  .  guaiFENesin (MUCINEX) 12 hr tablet 600 mg  600 mg Oral BID Gillis Santa, MD   600 mg at 12/16/20 0854  . guaiFENesin (ROBITUSSIN) 100 MG/5ML solution 200 mg  200 mg Oral Q6H PRN Gillis Santa, MD      . HYDROcodone-acetaminophen (NORCO/VICODIN) 5-325 MG per tablet 1-2 tablet  1-2 tablet Oral Q4H PRN Gillis Santa, MD   2 tablet at 12/13/20 1953  . hydrOXYzine (ATARAX/VISTARIL) tablet 25 mg  25 mg Oral TID PRN Gillis Santa, MD   25 mg at 12/16/20 0855  . ipratropium-albuterol (DUONEB) 0.5-2.5 (3) MG/3ML nebulizer solution 3 mL  3 mL Nebulization Q4H PRN Gillis Santa, MD      . lactulose (CHRONULAC) 10 GM/15ML solution 20  g  20 g Oral Daily Gillis Santa, MD   20 g at 12/16/20 0854  . magnesium sulfate IVPB 2 g 50 mL  2 g Intravenous Once Kathrynn Running, MD 50 mL/hr at 12/16/20 1123 2 g at 12/16/20 1123  . mirtazapine (REMERON) tablet 30 mg  30 mg Oral Daily Gillis Santa, MD   30 mg at 12/16/20 0853  . ondansetron (ZOFRAN-ODT) disintegrating tablet 4 mg  4 mg Oral Once Gillis Santa, MD      . pantoprazole (PROTONIX) EC tablet 40 mg  40 mg Oral Daily Gillis Santa, MD   40 mg at 12/16/20 0854  . polyethylene glycol (MIRALAX / GLYCOLAX) packet 17 g  17 g Oral QHS Gillis Santa, MD   17 g at 12/15/20 2148  . tamsulosin (FLOMAX) capsule 0.4 mg  0.4 mg Oral Daily Gillis Santa, MD   0.4 mg at 12/16/20 1829     Discharge Medications: Medication List    TAKE these medications   acetaminophen 500 MG tablet Commonly known as: TYLENOL Take 500 mg by mouth every 4 (four) hours as needed for mild pain or fever.   alum & mag hydroxide-simeth 200-200-20 MG/5ML suspension Commonly known as: MAALOX/MYLANTA Take 30 mLs by mouth 4 (four) times daily as needed for indigestion or heartburn.   amoxicillin 500 MG capsule Commonly known as: AMOXIL Take 2 capsules (1,000 mg total) by mouth 3 (three) times daily for 2 days. Start taking on: December 17, 2020   aspirin 81 MG chewable tablet Chew by mouth daily.   calcium carbonate 1500 (600 Ca) MG Tabs tablet Commonly known as: OSCAL Take 1 tablet by mouth daily.   cholecalciferol 25 MCG (1000 UNIT) tablet Commonly known as: VITAMIN D3 Take 1,000 Units by mouth daily.   divalproex 125 MG DR tablet Commonly known as: DEPAKOTE Take 125 mg by mouth 2 (two) times daily.   divalproex 125 MG capsule Commonly known as: DEPAKOTE SPRINKLE Take 1 capsule by mouth daily.   DULoxetine 60 MG capsule Commonly known as: CYMBALTA Take 60 mg by mouth daily.   enoxaparin 40 MG/0.4ML injection Commonly known as: LOVENOX Inject 0.4 mLs (40 mg total) into the skin  daily.   eszopiclone 2 MG Tabs tablet Commonly known as: LUNESTA Take 2 mg by mouth at bedtime. Take immediately before bedtime   feeding supplement Liqd Take 237 mLs by mouth 2 (two) times daily between meals.   guaifenesin 100 MG/5ML syrup Commonly known as: ROBITUSSIN Take 200 mg by mouth every 6 (six) hours as needed for cough.   HYDROcodone-acetaminophen 5-325 MG tablet Commonly known as: NORCO/VICODIN Take 1-2 tablets by mouth every 4 (four) hours as needed for moderate pain (pain score 4-6).   lactulose (encephalopathy) 10 GM/15ML Soln  Commonly known as: CHRONULAC Take 30 g by mouth daily.   lamoTRIgine 25 MG tablet Commonly known as: LAMICTAL Take 1 tablet by mouth daily.   loperamide 2 MG capsule Commonly known as: IMODIUM Take 2 mg by mouth every 3 (three) hours as needed for diarrhea or loose stools.   magnesium hydroxide 400 MG/5ML suspension Commonly known as: MILK OF MAGNESIA Take 30 mLs by mouth at bedtime as needed for mild constipation or moderate constipation.   Menthol-Zinc Oxide 0.44-20.625 % Oint Apply 1 application topically 4 (four) times daily as needed (skin irritation).   mirtazapine 30 MG tablet Commonly known as: REMERON Take 30 mg by mouth daily.   Multivitamin Adult (Minerals) Tabs Take 1 tablet by mouth daily.   neomycin-bacitracin-polymyxin ointment Commonly known as: NEOSPORIN Apply 1 application topically 4 (four) times daily as needed for wound care.   OLANZapine 5 MG tablet Commonly known as: ZYPREXA Take 5 mg by mouth at bedtime.   omeprazole 20 MG capsule Commonly known as: PRILOSEC Take 20 mg by mouth daily before breakfast.   ondansetron 4 MG disintegrating tablet Commonly known as: ZOFRAN-ODT Take 4 mg by mouth every 6 (six) hours as needed for nausea or vomiting.   polyethylene glycol 17 g packet Commonly known as: MIRALAX / GLYCOLAX Take 17 g by mouth at bedtime.   rivastigmine 6 MG  capsule Commonly known as: EXELON Take 6 mg by mouth 2 (two) times daily.   rosuvastatin 10 MG tablet Commonly known as: CRESTOR Take 10 mg by mouth daily.   senna-docusate 8.6-50 MG tablet Commonly known as: Senokot-S Take 2 tablets by mouth 2 (two) times daily.   traZODone 50 MG tablet Commonly known as: DESYREL Take 50 mg by mouth at bedtime.   vortioxetine HBr 20 MG Tabs tablet Commonly known as: TRINTELLIX Take 20 mg by mouth daily.      Relevant Imaging Results:  Relevant Lab Results:   Additional Information SS#: [id number]. Has a legal guardian through [facility] DSS: Pietro Cassis [phone]. Lives at [facility].  Allayne Butcher, RN

## 2020-12-16 NOTE — [place] FL2 (Signed)
[place] MEDICAID FL2 LEVEL OF CARE SCREENING TOOL     IDENTIFICATION  Patient Name: [name] Birthdate: [birth year] Sex: female Admission Date (Current Location): 12/12/2020  [place] and [place] Number:  Chiropodist and Address:  [facility], [address] [place], [place] [address]      Provider Number: [id number]  Attending Physician Name and Address:  Kathrynn Running, MD  Relative Name and Phone Number:  Pietro Cassis -Legal Guardian [phone]    Current Level of Care: Hospital Recommended Level of Care: Assisted Living Facility Prior Approval Number:    Date Approved/Denied:   PASRR Number:    Discharge Plan: Domiciliary (Rest home) (ALF)    Current Diagnoses: Patient Active Problem List   Diagnosis Date Noted  . Bipolar disorder (HCC) 12/15/2020  . Dementia with behavioral disturbance (HCC) 12/15/2020  . SOB (shortness of breath) 12/12/2020  . Acute on chronic respiratory failure with hypoxia (HCC) 12/12/2020  . Community acquired pneumonia 12/12/2020  . Pressure injury of skin 09/25/2019  . Fall   . Closed left subtrochanteric femur fracture (HCC) 09/21/2019    Orientation RESPIRATION BLADDER Height & Weight     Self,Place,Situation  O2 (2L [place]) Incontinent Weight: 48.1 kg Height:  5\' 7"  (170.2 cm)  BEHAVIORAL SYMPTOMS/MOOD NEUROLOGICAL BOWEL NUTRITION STATUS      Incontinent Diet (Dysphagia diet 3)  AMBULATORY STATUS COMMUNICATION OF NEEDS Skin   Limited Assist Verbally PU Stage and Appropriate Care PU Stage 1 Dressing: Daily (foam dressing)                     Personal Care Assistance Level of Assistance  Bathing,Feeding,Dressing Bathing Assistance: Limited assistance Feeding assistance: Limited assistance Dressing Assistance: Limited assistance     Functional Limitations Info             SPECIAL CARE FACTORS FREQUENCY  PT (By licensed PT),OT (By licensed OT)     PT Frequency: home  health 2 times per week OT Frequency: home health 2 times per week            Contractures Contractures Info: Not present    Additional Factors Info  Code Status,Allergies Code Status Info: DNR Allergies Info: NKA           Current Medications (12/16/2020):  This is the current hospital active medication list Current Facility-Administered Medications  Medication Dose Route Frequency Provider Last Rate Last Admin  . 0.9 %  sodium chloride infusion   Intravenous Continuous 02/15/2021, MD 50 mL/hr at 12/16/20 0835 New Bag at 12/16/20 0835  . acetaminophen (TYLENOL) tablet 500 mg  500 mg Oral Q6H PRN 02/15/21, MD   500 mg at 12/15/20 2148  . aspirin chewable tablet 81 mg  81 mg Oral Daily 2149, MD   81 mg at 12/16/20 0853  . cefTRIAXone (ROCEPHIN) 2 g in sodium chloride 0.9 % 100 mL IVPB  2 g Intravenous Q24H 02/15/21, MD 200 mL/hr at 12/16/20 0859 IV Pump [employer] at 12/16/20 0859  . diclofenac Sodium (VOLTAREN) 1 % topical gel 2 g  2 g Topical QID 02/15/21, MD   2 g at 12/15/20 2145  . divalproex (DEPAKOTE) DR tablet 125 mg  125 mg Oral BID 2146, MD   125 mg at 12/16/20 0855  . enoxaparin (LOVENOX) injection 40 mg  40 mg Subcutaneous Q24H 02/15/21, MD   40 mg at 12/15/20 2148  . guaiFENesin (MUCINEX) 12 hr  tablet 600 mg  600 mg Oral BID Gillis Santa, MD   600 mg at 12/16/20 0854  . guaiFENesin (ROBITUSSIN) 100 MG/5ML solution 200 mg  200 mg Oral Q6H PRN Gillis Santa, MD      . HYDROcodone-acetaminophen (NORCO/VICODIN) 5-325 MG per tablet 1-2 tablet  1-2 tablet Oral Q4H PRN Gillis Santa, MD   2 tablet at 12/13/20 1953  . hydrOXYzine (ATARAX/VISTARIL) tablet 25 mg  25 mg Oral TID PRN Gillis Santa, MD   25 mg at 12/16/20 0855  . ipratropium-albuterol (DUONEB) 0.5-2.5 (3) MG/3ML nebulizer solution 3 mL  3 mL Nebulization Q4H PRN Gillis Santa, MD      . lactulose (CHRONULAC) 10 GM/15ML solution 20 g  20 g Oral Daily Gillis Santa, MD   20 g at  12/16/20 0854  . mirtazapine (REMERON) tablet 30 mg  30 mg Oral Daily Gillis Santa, MD   30 mg at 12/16/20 0853  . ondansetron (ZOFRAN-ODT) disintegrating tablet 4 mg  4 mg Oral Once Gillis Santa, MD      . pantoprazole (PROTONIX) EC tablet 40 mg  40 mg Oral Daily Gillis Santa, MD   40 mg at 12/16/20 0854  . polyethylene glycol (MIRALAX / GLYCOLAX) packet 17 g  17 g Oral QHS Gillis Santa, MD   17 g at 12/15/20 2148  . tamsulosin (FLOMAX) capsule 0.4 mg  0.4 mg Oral Daily Gillis Santa, MD   0.4 mg at 12/16/20 1324     Discharge Medications: Medication List    TAKE these medications   acetaminophen 500 MG tablet Commonly known as: TYLENOL Take 500 mg by mouth every 4 (four) hours as needed for mild pain or fever.   alum & mag hydroxide-simeth 200-200-20 MG/5ML suspension Commonly known as: MAALOX/MYLANTA Take 30 mLs by mouth 4 (four) times daily as needed for indigestion or heartburn.   amoxicillin 500 MG capsule Commonly known as: AMOXIL Take 2 capsules (1,000 mg total) by mouth 3 (three) times daily for 2 days. Start taking on: December 17, 2020   aspirin 81 MG chewable tablet Chew by mouth daily.   calcium carbonate 1500 (600 Ca) MG Tabs tablet Commonly known as: OSCAL Take 1 tablet by mouth daily.   cholecalciferol 25 MCG (1000 UNIT) tablet Commonly known as: VITAMIN D3 Take 1,000 Units by mouth daily.   divalproex 125 MG DR tablet Commonly known as: DEPAKOTE Take 125 mg by mouth 2 (two) times daily.   divalproex 125 MG capsule Commonly known as: DEPAKOTE SPRINKLE Take 1 capsule by mouth daily.   DULoxetine 60 MG capsule Commonly known as: CYMBALTA Take 60 mg by mouth daily.   enoxaparin 40 MG/0.4ML injection Commonly known as: LOVENOX Inject 0.4 mLs (40 mg total) into the skin daily.   eszopiclone 2 MG Tabs tablet Commonly known as: LUNESTA Take 2 mg by mouth at bedtime. Take immediately before bedtime   feeding supplement Liqd Take 237 mLs by  mouth 2 (two) times daily between meals.   guaifenesin 100 MG/5ML syrup Commonly known as: ROBITUSSIN Take 200 mg by mouth every 6 (six) hours as needed for cough.   HYDROcodone-acetaminophen 5-325 MG tablet Commonly known as: NORCO/VICODIN Take 1-2 tablets by mouth every 4 (four) hours as needed for moderate pain (pain score 4-6).   lactulose (encephalopathy) 10 GM/15ML Soln Commonly known as: CHRONULAC Take 30 g by mouth daily.   lamoTRIgine 25 MG tablet Commonly known as: LAMICTAL Take 1 tablet by mouth daily.   loperamide 2 MG capsule  Commonly known as: IMODIUM Take 2 mg by mouth every 3 (three) hours as needed for diarrhea or loose stools.   magnesium hydroxide 400 MG/5ML suspension Commonly known as: MILK OF MAGNESIA Take 30 mLs by mouth at bedtime as needed for mild constipation or moderate constipation.   Menthol-Zinc Oxide 0.44-20.625 % Oint Apply 1 application topically 4 (four) times daily as needed (skin irritation).   mirtazapine 30 MG tablet Commonly known as: REMERON Take 30 mg by mouth daily.   Multivitamin Adult (Minerals) Tabs Take 1 tablet by mouth daily.   neomycin-bacitracin-polymyxin ointment Commonly known as: NEOSPORIN Apply 1 application topically 4 (four) times daily as needed for wound care.   OLANZapine 5 MG tablet Commonly known as: ZYPREXA Take 5 mg by mouth at bedtime.   omeprazole 20 MG capsule Commonly known as: PRILOSEC Take 20 mg by mouth daily before breakfast.   ondansetron 4 MG disintegrating tablet Commonly known as: ZOFRAN-ODT Take 4 mg by mouth every 6 (six) hours as needed for nausea or vomiting.   polyethylene glycol 17 g packet Commonly known as: MIRALAX / GLYCOLAX Take 17 g by mouth at bedtime.   rivastigmine 6 MG capsule Commonly known as: EXELON Take 6 mg by mouth 2 (two) times daily.   rosuvastatin 10 MG tablet Commonly known as: CRESTOR Take 10 mg by mouth daily.   senna-docusate 8.6-50  MG tablet Commonly known as: Senokot-S Take 2 tablets by mouth 2 (two) times daily.   traZODone 50 MG tablet Commonly known as: DESYREL Take 50 mg by mouth at bedtime.   vortioxetine HBr 20 MG Tabs tablet Commonly known as: TRINTELLIX Take 20 mg by mouth daily.        Relevant Imaging Results:  Relevant Lab Results:   Additional Information SS#: [id number]. Has a legal guardian through [facility] DSS: Pietro Cassis [phone]. Lives at [facility].  Allayne Butcher, RN

## 2020-12-16 NOTE — Discharge Summary (Addendum)
[name] WJX:[id number] DOB: 01/15/1941 DOA: 12/12/2020  PCP: Patient, No Pcp Per (Inactive)  Admit date: 12/12/2020 Discharge date: 12/16/2020  Time spent: 35 minutes  Recommendations for Outpatient Follow-up:  1. 2 more days amoxicillin  2. Will need repeat cbc in a few weeks (thrombocytosis). F/u ferritin and peripheral smear which were pending at time of discharge.    Discharge Diagnoses:  Principal Problem:   Acute on chronic respiratory failure with hypoxia (HCC) Active Problems:   SOB (shortness of breath)   Community acquired pneumonia   Discharge Condition: fair  Diet recommendation: heart healthy  Filed Weights   12/12/20 0907  Weight: 48.1 kg    History of present illness:  [name] is a 80 y.o. female with Past medical history of Alzheimer's dementia, depression presented from [facility] with complaining of back pain and neck pain, patient rolled over at nighttime when she felt a pop did not tell the staff until today morning after breakfast.  Patient was given fentanyl and she was having respiratory failure in the ED.  On further questioning patient said that she was having cough and shortness of breath as well.  ED Course:  RR 31, hypoxic respiratory failure, leukocytosis WBC 22.7  Hospital Course:   #Acute upper respiratory failure secondary to community-acquired pneumonia Has been weaned to 2 L. Breathing comfortably. Some degree of atelectasis likely contributory. CTA neg. - unable to wean off o2, discharging on 1 L (88% on room air), will need to continue incentive spirometry and monitor o2 needs, wean as able - cont prn duonebs  # Community-acquired pneumonia CXR and CTAreviewed revealing RLL pna. WBC normalized, clinically improving. Legionella and strep urine antigens neg. covid neg - treated with 4 days high-dose azithromycin and 5 days ceftriaxone. Will discharge with 2 more days amoxicillin 1 g tid beginning 4/6  # Chest wall  pain left-sided tenderness, musculoskeletal pain Continue Voltaren gel and Tylenol as needed  # Dementia and depression Resumed Depakote and Remeron PTA dose  # Hypomagnesemia Repleted  # Thrombocytosis Likely reactive - smear and ferritin pending, will need repeat CBC in several weeks  Procedures:  none   Consultations:  none  Discharge Exam: Vitals:   12/15/20 2338 12/16/20 0438  BP: 112/61 (!) 104/55  Pulse: 77 68  Resp: 20 18  Temp: 98.4 F (36.9 C) 97.9 F (36.6 C)  SpO2: 96% 94%    General:  alert oriented to place and person.  Appear in no distress, affect flat/depressed mood Eyes: PERRLA ENT: Oral Mucosa Clear, moist  Neck: no JVD,  Cardiovascular: S1 and S2 Present, no Murmur,  Respiratory: good respiratory effort, Bilateral Air entry equal and Decreased, mild  Crackles b/l at bases Abdomen: Bowel Sound present, Soft and no tenderness,  Skin: no rashes Extremities: no Pedal edema, no calf tenderness Neurologic: without any new focal findings Gait not checked due to patient safety concerns  Discharge Instructions   Discharge Instructions    Diet - low sodium heart healthy   Complete by: As directed    Face-to-face encounter (required for Medicare/Medicaid patients)   Complete by: As directed    I Silvano BilisNoah B Trenna Kiely certify that this patient is under my care and that I, or a nurse practitioner or physician's assistant working with me, had a face-to-face encounter that meets the physician face-to-face encounter requirements with this patient on 12/16/2020. The encounter with the patient was in whole, or in part for the following medical condition(s) which is  the primary reason for home health care (List medical condition): dementia, pneumonia   The encounter with the patient was in whole, or in part, for the following medical condition, which is the primary reason for home health care: community acquired pneumonia   I certify that, based on my findings, the  following services are medically necessary home health services: Physical therapy   Reason for Medically Necessary Home Health Services: Therapy- Investment banker, operational, Patent examiner   My clinical findings support the need for the above services: Cognitive impairments, dementia, or mental confusion  that make it unsafe to leave home   Further, I certify that my clinical findings support that this patient is homebound due to: Unable to leave home safely without assistance   Home Health   Complete by: As directed    To provide the following care/treatments:  PT OT     Increase activity slowly   Complete by: As directed      Allergies as of 12/16/2020   No Known Allergies     Medication List    TAKE these medications   acetaminophen 500 MG tablet Commonly known as: TYLENOL Take 500 mg by mouth every 4 (four) hours as needed for mild pain or fever.   alum & mag hydroxide-simeth 200-200-20 MG/5ML suspension Commonly known as: MAALOX/MYLANTA Take 30 mLs by mouth 4 (four) times daily as needed for indigestion or heartburn.   amoxicillin 500 MG capsule Commonly known as: AMOXIL Take 2 capsules (1,000 mg total) by mouth 3 (three) times daily for 2 days. Start taking on: December 17, 2020   aspirin 81 MG chewable tablet Chew by mouth daily.   calcium carbonate 1500 (600 Ca) MG Tabs tablet Commonly known as: OSCAL Take 1 tablet by mouth daily.   cholecalciferol 25 MCG (1000 UNIT) tablet Commonly known as: VITAMIN D3 Take 1,000 Units by mouth daily.   divalproex 125 MG DR tablet Commonly known as: DEPAKOTE Take 125 mg by mouth 2 (two) times daily.   divalproex 125 MG capsule Commonly known as: DEPAKOTE SPRINKLE Take 1 capsule by mouth daily.   DULoxetine 60 MG capsule Commonly known as: CYMBALTA Take 60 mg by mouth daily.   enoxaparin 40 MG/0.4ML injection Commonly known as: LOVENOX Inject 0.4 mLs (40 mg total) into the skin daily.   eszopiclone 2 MG Tabs  tablet Commonly known as: LUNESTA Take 2 mg by mouth at bedtime. Take immediately before bedtime   feeding supplement Liqd Take 237 mLs by mouth 2 (two) times daily between meals.   guaifenesin 100 MG/5ML syrup Commonly known as: ROBITUSSIN Take 200 mg by mouth every 6 (six) hours as needed for cough.   HYDROcodone-acetaminophen 5-325 MG tablet Commonly known as: NORCO/VICODIN Take 1-2 tablets by mouth every 4 (four) hours as needed for moderate pain (pain score 4-6).   lactulose (encephalopathy) 10 GM/15ML Soln Commonly known as: CHRONULAC Take 30 g by mouth daily.   lamoTRIgine 25 MG tablet Commonly known as: LAMICTAL Take 1 tablet by mouth daily.   loperamide 2 MG capsule Commonly known as: IMODIUM Take 2 mg by mouth every 3 (three) hours as needed for diarrhea or loose stools.   magnesium hydroxide 400 MG/5ML suspension Commonly known as: MILK OF MAGNESIA Take 30 mLs by mouth at bedtime as needed for mild constipation or moderate constipation.   Menthol-Zinc Oxide 0.44-20.625 % Oint Apply 1 application topically 4 (four) times daily as needed (skin irritation).   mirtazapine 30 MG tablet Commonly  known as: REMERON Take 30 mg by mouth daily.   Multivitamin Adult (Minerals) Tabs Take 1 tablet by mouth daily.   neomycin-bacitracin-polymyxin ointment Commonly known as: NEOSPORIN Apply 1 application topically 4 (four) times daily as needed for wound care.   OLANZapine 5 MG tablet Commonly known as: ZYPREXA Take 5 mg by mouth at bedtime.   omeprazole 20 MG capsule Commonly known as: PRILOSEC Take 20 mg by mouth daily before breakfast.   ondansetron 4 MG disintegrating tablet Commonly known as: ZOFRAN-ODT Take 4 mg by mouth every 6 (six) hours as needed for nausea or vomiting.   polyethylene glycol 17 g packet Commonly known as: MIRALAX / GLYCOLAX Take 17 g by mouth at bedtime.   rivastigmine 6 MG capsule Commonly known as: EXELON Take 6 mg by mouth 2 (two)  times daily.   rosuvastatin 10 MG tablet Commonly known as: CRESTOR Take 10 mg by mouth daily.   senna-docusate 8.6-50 MG tablet Commonly known as: Senokot-S Take 2 tablets by mouth 2 (two) times daily.   traZODone 50 MG tablet Commonly known as: DESYREL Take 50 mg by mouth at bedtime.   vortioxetine HBr 20 MG Tabs tablet Commonly known as: TRINTELLIX Take 20 mg by mouth daily.      No Known Allergies    The results of significant diagnostics from this hospitalization (including imaging, microbiology, ancillary and laboratory) are listed below for reference.    Significant Diagnostic Studies: CT Angio Chest PE W and/or Wo Contrast  Result Date: 12/12/2020 CLINICAL DATA:  Chest pain. EXAM: CT ANGIOGRAPHY CHEST WITH CONTRAST TECHNIQUE: Multidetector CT imaging of the chest was performed using the standard protocol during bolus administration of intravenous contrast. Multiplanar CT image reconstructions and MIPs were obtained to evaluate the vascular anatomy. CONTRAST:  46mL OMNIPAQUE IOHEXOL 350 MG/ML SOLN COMPARISON:  None. FINDINGS: Cardiovascular: Satisfactory opacification of the pulmonary arteries to the segmental level. No evidence of pulmonary embolism. Normal heart size. No pericardial effusion. Atherosclerosis of thoracic aorta is noted without aneurysm or dissection. Mediastinum/Nodes: Thyroid gland appears normal. No adenopathy is noted. Small sliding-type hiatal hernia is noted. Lungs/Pleura: No pneumothorax is noted. Small left pleural effusion is noted with adjacent subsegmental atelectasis. Mild subsegmental atelectasis scarring is noted inferiorly in the right middle lobe. Right posterior upper lobe opacity is noted concerning for atelectasis or possibly infiltrate. 6 mm nodule is noted anteriorly in the right lower lobe best seen on image number 69 of series 6. Upper Abdomen: No acute abnormality. Musculoskeletal: No chest wall abnormality. No acute or significant osseous  findings. Review of the MIP images confirms the above findings. IMPRESSION: No definite evidence of pulmonary embolus. Small left pleural effusion is noted with adjacent subsegmental atelectasis. Right posterior upper lobe opacity is noted concerning for atelectasis or possibly infiltrate. Small sliding-type hiatal hernia. 6 mm nodule is noted anteriorly in the right lower lobe. Non-contrast chest CT at 6-12 months is recommended. If the nodule is stable at time of repeat CT, then future CT at 18-24 months (from today's scan) is considered optional for low-risk patients, but is recommended for high-risk patients. This recommendation follows the consensus statement: Guidelines for Management of Incidental Pulmonary Nodules Detected on CT Images: From the Fleischner Society 2017; Radiology 2017; 284:228-243. Aortic Atherosclerosis (ICD10-I70.0). Electronically Signed   By: Lupita Raider M.D.   On: 12/12/2020 11:08   DG Chest Portable 1 View  Result Date: 12/12/2020 CLINICAL DATA:  Chest pain and left arm pain. EXAM: PORTABLE CHEST 1  VIEW COMPARISON:  02/29/2020 FINDINGS: New densities at the left lung base. Heart size is within normal limits and stable. There may be vascular congestion or densities at the medial right lung base. Patient is slightly rotated towards the left. No acute bone abnormality. IMPRESSION: New basilar lung densities particularly on the left side. Findings could be related to atelectasis and small amount of pleural fluid. Difficult to exclude infection. Electronically Signed   By: Richarda Overlie M.D.   On: 12/12/2020 09:34   DG Shoulder Left  Result Date: 12/12/2020 CLINICAL DATA:  Left chest pain. EXAM: LEFT SHOULDER - 2+ VIEW COMPARISON:  None. FINDINGS: There is no evidence of fracture or dislocation. There is no evidence of arthropathy or other focal bone abnormality. Soft tissues are unremarkable. IMPRESSION: Negative. Electronically Signed   By: Lupita Raider M.D.   On: 12/12/2020  09:34    Microbiology: Recent Results (from the past 240 hour(s))  Resp Panel by RT-PCR (Flu A&B, Covid) Nasopharyngeal Swab     Status: None   Collection Time: 12/12/20  5:27 PM   Specimen: Nasopharyngeal Swab; Nasopharyngeal(NP) swabs in vial transport medium  Result Value Ref Range Status   SARS Coronavirus 2 by RT PCR NEGATIVE NEGATIVE Final    Comment: (NOTE) SARS-CoV-2 target nucleic acids are NOT DETECTED.  The SARS-CoV-2 RNA is generally detectable in upper respiratory specimens during the acute phase of infection. The lowest concentration of SARS-CoV-2 viral copies this assay can detect is 138 copies/mL. A negative result does not preclude SARS-Cov-2 infection and should not be used as the sole basis for treatment or other patient management decisions. A negative result may occur with  improper specimen collection/handling, submission of specimen other than nasopharyngeal swab, presence of viral mutation(s) within the areas targeted by this assay, and inadequate number of viral copies(<138 copies/mL). A negative result must be combined with clinical observations, patient history, and epidemiological information. The expected result is Negative.  Fact Sheet for Patients:  BloggerCourse.com  Fact Sheet for Healthcare Providers:  SeriousBroker.it  This test is no t yet approved or cleared by the [place] FDA and  has been authorized for detection and/or diagnosis of SARS-CoV-2 by FDA under an Emergency Use Authorization (EUA). This EUA will remain  in effect (meaning this test can be used) for the duration of the COVID-19 declaration under Section 564(b)(1) of the Act, 21 U.S.C.section 360bbb-3(b)(1), unless the authorization is terminated  or revoked sooner.       Influenza A by PCR NEGATIVE NEGATIVE Final   Influenza B by PCR NEGATIVE NEGATIVE Final    Comment: (NOTE) The Xpert Xpress SARS-CoV-2/FLU/RSV plus  assay is intended as an aid in the diagnosis of influenza from Nasopharyngeal swab specimens and should not be used as a sole basis for treatment. Nasal washings and aspirates are unacceptable for Xpert Xpress SARS-CoV-2/FLU/RSV testing.  Fact Sheet for Patients: BloggerCourse.com  Fact Sheet for Healthcare Providers: SeriousBroker.it  This test is not yet approved or cleared by the [place] FDA and has been authorized for detection and/or diagnosis of SARS-CoV-2 by FDA under an Emergency Use Authorization (EUA). This EUA will remain in effect (meaning this test can be used) for the duration of the COVID-19 declaration under Section 564(b)(1) of the Act, 21 U.S.C. section 360bbb-3(b)(1), unless the authorization is terminated or revoked.  Performed at [facility], [address] [place], [place] [address]   Blood culture (routine x 2)     Status: None (Preliminary result)  Collection Time: 12/12/20  5:27 PM   Specimen: BLOOD  Result Value Ref Range Status   Specimen Description BLOOD LEFT ANTECUBITAL  Final   Special Requests   Final    BOTTLES DRAWN AEROBIC AND ANAEROBIC Blood Culture results may not be optimal due to an inadequate volume of blood received in culture bottles   Culture   Final    NO GROWTH 4 DAYS Performed at [facility], [address] [place], [place] [address]    Report Status PENDING  Incomplete  Blood culture (routine x 2)     Status: None (Preliminary result)   Collection Time: 12/12/20  5:27 PM   Specimen: BLOOD  Result Value Ref Range Status   Specimen Description BLOOD BLOOD RIGHT WRIST  Final   Special Requests   Final    BOTTLES DRAWN AEROBIC AND ANAEROBIC Blood Culture results may not be optimal due to an inadequate volume of blood received in culture bottles   Culture   Final    NO GROWTH 4 DAYS Performed at [facility], [address], [place],  [place] [address]    Report Status PENDING  Incomplete  MRSA PCR Screening     Status: None   Collection Time: 12/15/20  6:00 AM   Specimen: Nasopharyngeal  Result Value Ref Range Status   MRSA by PCR NEGATIVE NEGATIVE Final    Comment:        The GeneXpert MRSA Assay (FDA approved for NASAL specimens only), is one component of a comprehensive MRSA colonization surveillance program. It is not intended to diagnose MRSA infection nor to guide or monitor treatment for MRSA infections. Performed at [facility], [address] [place], [place] [address]      Labs: Basic Metabolic Panel: Recent Labs  Lab 12/12/20 [phone] 12/13/20 0744 12/14/20 0431 12/15/20 0621 12/16/20 0425  NA 138 137 141 140 141  K 3.8 3.7 3.9 4.0 3.8  CL 104 108 111 111 108  CO2 GLUCOSE 148* 89 87 80 89  BUN CREATININE 0.67 0.55 0.47 0.56 0.46  CALCIUM 9.6 8.5* 8.6* 8.5* 8.6*  MG  --  1.2* 1.8  --  1.2*  PHOS  --  3.3 2.5  --  2.5   Liver Function Tests: Recent Labs  Lab 12/12/20 0918  AST 26  ALT 23  ALKPHOS 84  BILITOT 0.4  PROT 7.4  ALBUMIN 3.5   No results for input(s): LIPASE, AMYLASE in the last 168 hours. Recent Labs  Lab 12/15/20 0621  AMMONIA 22   CBC: Recent Labs  Lab 12/12/20 0918 12/13/20 0744 12/14/20 0431 12/15/20 0621 12/16/20 0425  WBC 22.7* 14.2* 11.8* 8.3 10.1  HGB 12.1 10.8* 9.9* 10.4* 9.7*  HCT 38.6 33.5* 32.0* 33.5* 30.8*  MCV 91.7 92.5 95.2 93.3 91.1  PLT 493* 476* 494* 531* 547*   Cardiac Enzymes: No results for input(s): CKTOTAL, CKMB, CKMBINDEX, TROPONINI in the last 168 hours. BNP: BNP (last 3 results) No results for input(s): BNP in the last 8760 hours.  ProBNP (last 3 results) No results for input(s): PROBNP in the last 8760 hours.  CBG: Recent Labs  Lab 12/15/20 1126  GLUCAP 73       Signed:  Silvano Bilis MD.  [facility] 12/16/2020, 8:48 AM

## 2020-12-16 NOTE — Progress Notes (Signed)
SLP Cancellation Note  Patient Details Name: [name] MRN: [id number] DOB: 1941-04-22   Cancelled treatment:       Reason Eval/Treat Not Completed:  (chart reviewed; met w/ NSG while pt slept). Discussed pt's status; pt is pending Discharge to [facility] after Lunch today. NSG denied any difficulty w/ swallowing po's; discussed general aspiration precautions and support needed at mealtime d/t suspected fatigue and weakness sec. to illness. Recommend continue current diet w/ general aspiration precautions; Pills in Puree for safer swallowing as needed. NSG agreed and share in report.      Orinda Kenner, MS, CCC-SLP Speech Language Pathologist Rehab Services [phone] [facility] 12/16/2020, 11:27 AM

## 2020-12-16 NOTE — TOC Transition Note (Signed)
Transition of Care [facility]) - CM/SW Discharge Note   Patient Details  Name: [name] MRN: [id number] Date of Birth: 09-28-1940  Transition of Care [facility]) CM/SW Contact:  Allayne Butcher, RN Phone Number: 12/16/2020, 11:51 AM   Clinical Narrative:    Oxygen has been delivered to [facility].  Bedside RN will call report to [phone].  RNCM will arrange EMS transportation.  Updated FL2 will be faxed and sent over to [facility].  RNCM will arranged EMS transportation.     Final next level of care: Assisted Living Barriers to Discharge: Barriers Resolved   Patient Goals and CMS Choice Patient states their goals for this hospitalization and ongoing recovery are:: Plan to return to [facility]- discussed with Legal guardian CMS Medicare.gov Compare Post Acute Care list provided to:: Legal Guardian Choice offered to / list presented to : [facility] POA / Guardian  Discharge Placement              Patient chooses bed at:  [facility]) Patient to be transferred to facility by: [facility] Name of family member notified: Pietro Cassis- Legal guardian Patient and family notified of of transfer: 12/16/20  Discharge Plan and Services   Discharge Planning Services: CM Consult Post Acute Care Choice: Home Health          DME Arranged: Oxygen DME Agency: [employer] Date DME Agency Contacted: 12/16/20 Time DME Agency Contacted: [phone] Representative spoke with at DME Agency: Ian Malkin HH Arranged: PT,OT HH Agency: [facility] Date HH Agency Contacted: 12/16/20 Time HH Agency Contacted: 0930 Representative spoke with at [facility] Agency: Barbara Cower  Social Determinants of Health (SDOH) Interventions     Readmission Risk Interventions No flowsheet data found.

## 2020-12-17 LAB — CULTURE, BLOOD (ROUTINE X 2)
Culture: NO GROWTH
Culture: NO GROWTH

## 2020-12-19 ENCOUNTER — Inpatient Hospital Stay
Admission: EM | Admit: 2020-12-19 | Discharge: 2020-12-25 | DRG: 189 | Disposition: A | Discharge status: Skilled Nursing Facility | Payer: Medicare Other | Attending: Internal Medicine | Admitting: Internal Medicine

## 2020-12-19 ENCOUNTER — Other Ambulatory Visit: Payer: Self-pay

## 2020-12-19 ENCOUNTER — Emergency Department: Payer: Medicare Other

## 2020-12-19 DIAGNOSIS — D75839 Thrombocytosis, unspecified: Secondary | ICD-10-CM | POA: Diagnosis present

## 2020-12-19 DIAGNOSIS — J90 Pleural effusion, not elsewhere classified: Secondary | ICD-10-CM | POA: Diagnosis present

## 2020-12-19 DIAGNOSIS — J9621 Acute and chronic respiratory failure with hypoxia: Secondary | ICD-10-CM | POA: Diagnosis not present

## 2020-12-19 DIAGNOSIS — Z8701 Personal history of pneumonia (recurrent): Secondary | ICD-10-CM

## 2020-12-19 DIAGNOSIS — G8929 Other chronic pain: Secondary | ICD-10-CM | POA: Diagnosis present

## 2020-12-19 DIAGNOSIS — G309 Alzheimer's disease, unspecified: Secondary | ICD-10-CM | POA: Diagnosis present

## 2020-12-19 DIAGNOSIS — E785 Hyperlipidemia, unspecified: Secondary | ICD-10-CM

## 2020-12-19 DIAGNOSIS — J9601 Acute respiratory failure with hypoxia: Secondary | ICD-10-CM | POA: Diagnosis not present

## 2020-12-19 DIAGNOSIS — F319 Bipolar disorder, unspecified: Secondary | ICD-10-CM

## 2020-12-19 DIAGNOSIS — R0602 Shortness of breath: Secondary | ICD-10-CM

## 2020-12-19 DIAGNOSIS — F0391 Unspecified dementia with behavioral disturbance: Secondary | ICD-10-CM | POA: Diagnosis not present

## 2020-12-19 DIAGNOSIS — I739 Peripheral vascular disease, unspecified: Secondary | ICD-10-CM

## 2020-12-19 DIAGNOSIS — Z66 Do not resuscitate: Secondary | ICD-10-CM | POA: Diagnosis present

## 2020-12-19 DIAGNOSIS — R54 Age-related physical debility: Secondary | ICD-10-CM | POA: Diagnosis present

## 2020-12-19 DIAGNOSIS — Z79899 Other long term (current) drug therapy: Secondary | ICD-10-CM

## 2020-12-19 DIAGNOSIS — F0281 Dementia in other diseases classified elsewhere with behavioral disturbance: Secondary | ICD-10-CM | POA: Diagnosis present

## 2020-12-19 DIAGNOSIS — Z20822 Contact with and (suspected) exposure to covid-19: Secondary | ICD-10-CM | POA: Diagnosis present

## 2020-12-19 DIAGNOSIS — F03918 Unspecified dementia, unspecified severity, with other behavioral disturbance: Secondary | ICD-10-CM | POA: Diagnosis present

## 2020-12-19 DIAGNOSIS — Z681 Body mass index (BMI) 19 or less, adult: Secondary | ICD-10-CM

## 2020-12-19 DIAGNOSIS — D649 Anemia, unspecified: Secondary | ICD-10-CM | POA: Diagnosis present

## 2020-12-19 DIAGNOSIS — K219 Gastro-esophageal reflux disease without esophagitis: Secondary | ICD-10-CM | POA: Diagnosis not present

## 2020-12-19 DIAGNOSIS — R9431 Abnormal electrocardiogram [ECG] [EKG]: Secondary | ICD-10-CM | POA: Diagnosis present

## 2020-12-19 DIAGNOSIS — E162 Hypoglycemia, unspecified: Secondary | ICD-10-CM | POA: Diagnosis present

## 2020-12-19 DIAGNOSIS — J189 Pneumonia, unspecified organism: Secondary | ICD-10-CM | POA: Diagnosis not present

## 2020-12-19 DIAGNOSIS — R269 Unspecified abnormalities of gait and mobility: Secondary | ICD-10-CM | POA: Diagnosis present

## 2020-12-19 DIAGNOSIS — G4700 Insomnia, unspecified: Secondary | ICD-10-CM

## 2020-12-19 DIAGNOSIS — Z7982 Long term (current) use of aspirin: Secondary | ICD-10-CM

## 2020-12-19 DIAGNOSIS — E876 Hypokalemia: Secondary | ICD-10-CM | POA: Diagnosis present

## 2020-12-19 DIAGNOSIS — Z9981 Dependence on supplemental oxygen: Secondary | ICD-10-CM

## 2020-12-19 DIAGNOSIS — Z87891 Personal history of nicotine dependence: Secondary | ICD-10-CM

## 2020-12-19 DIAGNOSIS — Z96642 Presence of left artificial hip joint: Secondary | ICD-10-CM | POA: Diagnosis present

## 2020-12-19 DIAGNOSIS — N390 Urinary tract infection, site not specified: Secondary | ICD-10-CM | POA: Diagnosis present

## 2020-12-19 DIAGNOSIS — R627 Adult failure to thrive: Secondary | ICD-10-CM | POA: Diagnosis present

## 2020-12-19 DIAGNOSIS — Z993 Dependence on wheelchair: Secondary | ICD-10-CM

## 2020-12-19 LAB — CBC WITH DIFFERENTIAL/PLATELET
Abs Immature Granulocytes: 0.05 K/uL (ref 0.00–0.07)
Basophils Absolute: 0 K/uL (ref 0.0–0.1)
Basophils Relative: 0 %
Eosinophils Absolute: 0.3 K/uL (ref 0.0–0.5)
Eosinophils Relative: 3 %
HCT: 35.4 % — ABNORMAL LOW (ref 36.0–46.0)
Hemoglobin: 11.3 g/dL — ABNORMAL LOW (ref 12.0–15.0)
Immature Granulocytes: 0 %
Lymphocytes Relative: 20 %
Lymphs Abs: 2.2 K/uL (ref 0.7–4.0)
MCH: 28.8 pg (ref 26.0–34.0)
MCHC: 31.9 g/dL (ref 30.0–36.0)
MCV: 90.1 fL (ref 80.0–100.0)
Monocytes Absolute: 1 K/uL (ref 0.1–1.0)
Monocytes Relative: 9 %
Neutro Abs: 7.7 K/uL (ref 1.7–7.7)
Neutrophils Relative %: 68 %
Platelets: 667 K/uL — ABNORMAL HIGH (ref 150–400)
RBC: 3.93 MIL/uL (ref 3.87–5.11)
RDW: 13 % (ref 11.5–15.5)
WBC: 11.2 K/uL — ABNORMAL HIGH (ref 4.0–10.5)
nRBC: 0 % (ref 0.0–0.2)

## 2020-12-19 LAB — PROCALCITONIN: Procalcitonin: <0.1 ng/mL

## 2020-12-19 LAB — BASIC METABOLIC PANEL
Anion gap: 10 (ref 5–15)
BUN: 18 mg/dL (ref 8–23)
CO2: 26 mmol/L (ref 22–32)
Calcium: 9.2 mg/dL (ref 8.9–10.3)
Chloride: 106 mmol/L (ref 98–111)
Creatinine, Ser: 0.58 mg/dL (ref 0.44–1.00)
GFR, Estimated: >60 mL/min (ref >60)
Glucose, Bld: 90 mg/dL (ref 70–99)
Potassium: 3.3 mmol/L — ABNORMAL LOW (ref 3.5–5.1)
Sodium: 142 mmol/L (ref 135–145)

## 2020-12-19 LAB — TROPONIN I (HIGH SENSITIVITY)
Troponin I (High Sensitivity): 7 ng/L
Troponin I (High Sensitivity): 7 ng/L (ref <18)

## 2020-12-19 LAB — RESP PANEL BY RT-PCR (FLU A&B, COVID) ARPGX2
Influenza A by PCR: NEGATIVE
Influenza B by PCR: NEGATIVE
SARS Coronavirus 2 by RT PCR: NEGATIVE

## 2020-12-19 LAB — MAGNESIUM: Magnesium: 1.1 mg/dL — ABNORMAL LOW (ref 1.7–2.4)

## 2020-12-19 MED ORDER — SODIUM CHLORIDE 0.9 % IV SOLN
2.0000 g | Freq: Once | INTRAVENOUS | Status: AC
  Administered 2020-12-19: 2 g via INTRAVENOUS
  Filled 2020-12-19: qty 2

## 2020-12-19 MED ORDER — ROSUVASTATIN CALCIUM 10 MG PO TABS
10.0000 mg | ORAL_TABLET | Freq: Every day | ORAL | Status: DC
  Administered 2020-12-20 – 2020-12-25 (×6): 10 mg via ORAL
  Filled 2020-12-19 (×7): qty 1

## 2020-12-19 MED ORDER — OLANZAPINE 5 MG PO TABS
5.0000 mg | ORAL_TABLET | Freq: Every day | ORAL | Status: DC
  Administered 2020-12-19 – 2020-12-24 (×5): 5 mg via ORAL
  Filled 2020-12-19 (×7): qty 1

## 2020-12-19 MED ORDER — MIRTAZAPINE 15 MG PO TABS
30.0000 mg | ORAL_TABLET | Freq: Every day | ORAL | Status: DC
  Administered 2020-12-20 – 2020-12-25 (×6): 30 mg via ORAL
  Filled 2020-12-19 (×6): qty 2

## 2020-12-19 MED ORDER — IPRATROPIUM-ALBUTEROL 0.5-2.5 (3) MG/3ML IN SOLN
3.0000 mL | Freq: Four times a day (QID) | RESPIRATORY_TRACT | Status: DC | PRN
  Administered 2020-12-19 – 2020-12-21 (×2): 3 mL via RESPIRATORY_TRACT
  Filled 2020-12-19 (×2): qty 3

## 2020-12-19 MED ORDER — ALUM & MAG HYDROXIDE-SIMETH 200-200-20 MG/5ML PO SUSP: 30.0000 mL | Freq: Four times a day (QID) | ORAL | Status: DC | PRN

## 2020-12-19 MED ORDER — SODIUM CHLORIDE 0.9% FLUSH
3.0000 mL | Freq: Two times a day (BID) | INTRAVENOUS | Status: DC
  Administered 2020-12-20 – 2020-12-24 (×11): 3 mL via INTRAVENOUS

## 2020-12-19 MED ORDER — LAMOTRIGINE 25 MG PO TABS
25.0000 mg | ORAL_TABLET | Freq: Every day | ORAL | Status: DC
  Administered 2020-12-20 – 2020-12-25 (×6): 25 mg via ORAL
  Filled 2020-12-19 (×6): qty 1

## 2020-12-19 MED ORDER — SENNOSIDES-DOCUSATE SODIUM 8.6-50 MG PO TABS
2.0000 | ORAL_TABLET | Freq: Two times a day (BID) | ORAL | Status: DC
  Administered 2020-12-19 – 2020-12-25 (×9): 2 via ORAL
  Filled 2020-12-19 (×10): qty 2

## 2020-12-19 MED ORDER — POLYETHYLENE GLYCOL 3350 17 G PO PACK: 17.0000 g | PACK | Freq: Every day | ORAL | Status: DC | PRN

## 2020-12-19 MED ORDER — ASPIRIN 81 MG PO CHEW
81.0000 mg | CHEWABLE_TABLET | Freq: Every day | ORAL | Status: DC
  Administered 2020-12-20 – 2020-12-25 (×6): 81 mg via ORAL
  Filled 2020-12-19 (×6): qty 1

## 2020-12-19 MED ORDER — VANCOMYCIN HCL IN DEXTROSE 1-5 GM/200ML-% IV SOLN
1000.0000 mg | Freq: Once | INTRAVENOUS | Status: AC
  Administered 2020-12-19: 1000 mg via INTRAVENOUS
  Filled 2020-12-19: qty 200

## 2020-12-19 MED ORDER — RIVASTIGMINE TARTRATE 3 MG PO CAPS
6.0000 mg | ORAL_CAPSULE | Freq: Two times a day (BID) | ORAL | Status: DC
  Administered 2020-12-19 – 2020-12-25 (×10): 6 mg via ORAL
  Filled 2020-12-19 (×15): qty 2

## 2020-12-19 MED ORDER — ONDANSETRON 4 MG PO TBDP: 4.0000 mg | ORAL_TABLET | Freq: Four times a day (QID) | ORAL | Status: DC | PRN

## 2020-12-19 MED ORDER — ACETAMINOPHEN 650 MG RE SUPP: 650.0000 mg | Freq: Four times a day (QID) | RECTAL | Status: DC | PRN

## 2020-12-19 MED ORDER — POTASSIUM CHLORIDE CRYS ER 20 MEQ PO TBCR
40.0000 meq | EXTENDED_RELEASE_TABLET | Freq: Once | ORAL | Status: AC
  Administered 2020-12-19: 40 meq via ORAL
  Filled 2020-12-19: qty 2

## 2020-12-19 MED ORDER — MAGNESIUM HYDROXIDE 400 MG/5ML PO SUSP: 30.0000 mL | Freq: Every evening | ORAL | Status: DC | PRN

## 2020-12-19 MED ORDER — PANTOPRAZOLE SODIUM 40 MG PO TBEC
40.0000 mg | DELAYED_RELEASE_TABLET | Freq: Every day | ORAL | Status: DC
  Administered 2020-12-20 – 2020-12-25 (×6): 40 mg via ORAL
  Filled 2020-12-19 (×6): qty 1

## 2020-12-19 MED ORDER — ADULT MULTIVITAMIN W/MINERALS CH
1.0000 | ORAL_TABLET | Freq: Every day | ORAL | Status: DC
  Administered 2020-12-20 – 2020-12-25 (×6): 1 via ORAL
  Filled 2020-12-19 (×6): qty 1

## 2020-12-19 MED ORDER — LACTULOSE 10 GM/15ML PO SOLN
30.0000 g | Freq: Every day | ORAL | Status: DC
  Administered 2020-12-20 – 2020-12-25 (×5): 30 g via ORAL
  Filled 2020-12-19 (×6): qty 60

## 2020-12-19 MED ORDER — CAMPHOR-MENTHOL 0.5-0.5 % EX LOTN
1.0000 "application " | TOPICAL_LOTION | Freq: Four times a day (QID) | CUTANEOUS | Status: DC | PRN
  Filled 2020-12-19: qty 222

## 2020-12-19 MED ORDER — ACETAMINOPHEN 325 MG PO TABS
650.0000 mg | ORAL_TABLET | Freq: Four times a day (QID) | ORAL | Status: DC | PRN
  Administered 2020-12-19: 650 mg via ORAL
  Filled 2020-12-19: qty 2

## 2020-12-19 MED ORDER — ENSURE ENLIVE PO LIQD
237.0000 mL | Freq: Two times a day (BID) | ORAL | Status: DC
  Administered 2020-12-20 – 2020-12-25 (×10): 237 mL via ORAL

## 2020-12-19 MED ORDER — DULOXETINE HCL 30 MG PO CPEP
60.0000 mg | ORAL_CAPSULE | Freq: Every day | ORAL | Status: DC
  Administered 2020-12-20 – 2020-12-25 (×6): 60 mg via ORAL
  Filled 2020-12-19 (×7): qty 2

## 2020-12-19 MED ORDER — SODIUM CHLORIDE 0.9 % IV SOLN
500.0000 mg | INTRAVENOUS | Status: DC
  Administered 2020-12-19 – 2020-12-20 (×2): 500 mg via INTRAVENOUS
  Filled 2020-12-19 (×3): qty 500

## 2020-12-19 MED ORDER — SODIUM CHLORIDE 0.9 % IV SOLN
2.0000 g | INTRAVENOUS | Status: DC
  Administered 2020-12-20 – 2020-12-21 (×2): 2 g via INTRAVENOUS
  Filled 2020-12-19: qty 20
  Filled 2020-12-19 (×2): qty 2

## 2020-12-19 MED ORDER — DIVALPROEX SODIUM 125 MG PO DR TAB
125.0000 mg | DELAYED_RELEASE_TABLET | Freq: Two times a day (BID) | ORAL | Status: DC
  Administered 2020-12-19 – 2020-12-25 (×10): 125 mg via ORAL
  Filled 2020-12-19 (×15): qty 1

## 2020-12-19 MED ORDER — ENOXAPARIN SODIUM 40 MG/0.4ML ~~LOC~~ SOLN
40.0000 mg | SUBCUTANEOUS | Status: DC
  Administered 2020-12-19 – 2020-12-24 (×5): 40 mg via SUBCUTANEOUS
  Filled 2020-12-19 (×6): qty 0.4

## 2020-12-19 MED ORDER — TRAZODONE HCL 50 MG PO TABS
50.0000 mg | ORAL_TABLET | Freq: Every day | ORAL | Status: DC
  Administered 2020-12-19 – 2020-12-24 (×5): 50 mg via ORAL
  Filled 2020-12-19 (×6): qty 1

## 2020-12-19 NOTE — ED Notes (Signed)
High fall precautions in place.

## 2020-12-19 NOTE — ED Notes (Signed)
Pt cleaned due to episode of urinary incontinence, peri care performed and sheets changed. New brief placed on pt and purewick in use for urinary management.

## 2020-12-19 NOTE — ED Triage Notes (Signed)
Pt presents from [facility] with complaint of difficulty breathing that started last night. EMS reports her oxygen sat was 87% on room air when they arrived, placed her on oxygen via nasal cannula at 4L and she is 94% upon arrival to ED. She reports she is currently taking amoxicillin for UTI, denies respiratory medical history.

## 2020-12-19 NOTE — ED Provider Notes (Signed)
[facility] Emergency Department Provider Note   ____________________________________________   I have reviewed the triage vital signs and the nursing notes.   HISTORY  Chief Complaint Shortness of Breath   History limited by and level 5 caveat due to: Dementia   HPI [name] is a 80 y.o. female who presents to the emergency department today because of concern for shortness of breath. The patient states that it started last night. It started gradually while she was tossing and turning. States that she has had some cough and that it hurst all over when she coughs. Denies any fevers. Denies any leg pain or swelling. Did have a recent hospitalization for pneumonia and says that she thinks she finished her antibiotics.    Records reviewed. Per medical record review patient has a history of dementia, recent hospitalization. Was supposed to be discharged home on 1L oxygen.   Past Medical History:  Diagnosis Date  . Alzheimer's dementia (HCC)   . Insomnia   . Low back pain   . Vomiting     Patient Active Problem List   Diagnosis Date Noted  . Bipolar disorder (HCC) 12/15/2020  . Dementia with behavioral disturbance (HCC) 12/15/2020  . SOB (shortness of breath) 12/12/2020  . Acute on chronic respiratory failure with hypoxia (HCC) 12/12/2020  . Community acquired pneumonia 12/12/2020  . Pressure injury of skin 09/25/2019  . Fall   . Closed left subtrochanteric femur fracture (HCC) 09/21/2019    Past Surgical History:  Procedure Laterality Date  . HIP ARTHROPLASTY Left 09/22/2019   Procedure: ARTHROPLASTY BIPOLAR HIP (HEMIARTHROPLASTY) RIGHT;  Surgeon: Christena Flake, MD;  Location: [facility] ORS;  Service: Orthopedics;  Laterality: Left;  Marland Kitchen VAGINAL HYSTERECTOMY      Prior to Admission medications   Medication Sig Start Date End Date Taking? Authorizing Provider  acetaminophen (TYLENOL) 500 MG tablet Take 500 mg by mouth every 4 (four) hours as needed  for mild pain or fever.     [provider]  alum & mag hydroxide-simeth (MAALOX/MYLANTA) 200-200-20 MG/5ML suspension Take 30 mLs by mouth 4 (four) times daily as needed for indigestion or heartburn.    [provider]  amoxicillin (AMOXIL) 500 MG capsule Take 2 capsules (1,000 mg total) by mouth 3 (three) times daily for 2 days. 12/17/20 12/19/20  Wouk, Wilfred Curtis, MD  aspirin 81 MG chewable tablet Chew by mouth daily.    [provider]  calcium carbonate (OSCAL) 1500 (600 Ca) MG TABS tablet Take 1 tablet by mouth daily. 12/05/20   [provider]  cholecalciferol (VITAMIN D3) 25 MCG (1000 UT) tablet Take 1,000 Units by mouth daily.    [provider]  divalproex (DEPAKOTE SPRINKLE) 125 MG capsule Take 1 capsule by mouth daily. 11/18/20   [provider]  divalproex (DEPAKOTE) 125 MG DR tablet Take 125 mg by mouth 2 (two) times daily.    [provider]  DULoxetine (CYMBALTA) 60 MG capsule Take 60 mg by mouth daily.    [provider]  enoxaparin (LOVENOX) 40 MG/0.4ML injection Inject 0.4 mLs (40 mg total) into the skin daily. 09/24/19   Anson Oregon, PA-C  eszopiclone (LUNESTA) 2 MG TABS tablet Take 2 mg by mouth at bedtime. Take immediately before bedtime    [provider]  feeding supplement, ENSURE ENLIVE, (ENSURE ENLIVE) LIQD Take 237 mLs by mouth 2 (two) times daily between meals. 09/27/19   Rolly Salter, MD  guaifenesin (ROBITUSSIN) 100 MG/5ML  syrup Take 200 mg by mouth every 6 (six) hours as needed for cough.    [provider]  HYDROcodone-acetaminophen (NORCO/VICODIN) 5-325 MG tablet Take 1-2 tablets by mouth every 4 (four) hours as needed for moderate pain (pain score 4-6). Patient not taking: Reported on 12/14/2020 09/24/19   Anson Oregon, PA-C  lactulose, encephalopathy, (CHRONULAC) 10 GM/15ML SOLN Take 30 g by mouth daily.    [provider]  lamoTRIgine (LAMICTAL) 25 MG tablet  Take 1 tablet by mouth daily.    [provider]  loperamide (IMODIUM) 2 MG capsule Take 2 mg by mouth every 3 (three) hours as needed for diarrhea or loose stools.    [provider]  magnesium hydroxide (MILK OF MAGNESIA) 400 MG/5ML suspension Take 30 mLs by mouth at bedtime as needed for mild constipation or moderate constipation.    [provider]  Menthol-Zinc Oxide 0.44-20.625 % OINT Apply 1 application topically 4 (four) times daily as needed (skin irritation).    [provider]  mirtazapine (REMERON) 30 MG tablet Take 30 mg by mouth daily.    [provider]  Multiple Vitamins-Minerals (MULTIVITAMIN ADULT, MINERALS,) TABS Take 1 tablet by mouth daily.    [provider]  neomycin-bacitracin-polymyxin (NEOSPORIN) ointment Apply 1 application topically 4 (four) times daily as needed for wound care. Patient not taking: No sig reported    [provider]  OLANZapine (ZYPREXA) 5 MG tablet Take 5 mg by mouth at bedtime.    [provider]  omeprazole (PRILOSEC) 20 MG capsule Take 20 mg by mouth daily before breakfast.    [provider]  ondansetron (ZOFRAN-ODT) 4 MG disintegrating tablet Take 4 mg by mouth every 6 (six) hours as needed for nausea or vomiting.    [provider]  polyethylene glycol (MIRALAX / GLYCOLAX) 17 g packet Take 17 g by mouth at bedtime.    [provider]  rivastigmine (EXELON) 6 MG capsule Take 6 mg by mouth 2 (two) times daily.    [provider]  rosuvastatin (CRESTOR) 10 MG tablet Take 10 mg by mouth daily.    [provider]  senna-docusate (SENOKOT-S) 8.6-50 MG tablet Take 2 tablets by mouth 2 (two) times daily.    [provider]  traZODone (DESYREL) 50 MG tablet Take 50 mg by mouth at bedtime.    [provider]  vortioxetine HBr (TRINTELLIX) 20 MG TABS tablet Take 20 mg by mouth daily. Patient not taking: No sig reported     [provider]    Allergies Patient has no known allergies.  History reviewed. No pertinent family history.  Social History Social History   Tobacco Use  . Smoking status: Former Games developer  . Smokeless tobacco: Never Used  Substance Use Topics  . Alcohol use: Not Currently  . Drug use: Not Currently    Review of Systems Constitutional: No fever/chills Eyes: No visual changes. ENT: No sore throat. Cardiovascular: Positive for chest pain. Respiratory: Positive for shortness of breath. Gastrointestinal: No abdominal pain.  No nausea, no vomiting.  No diarrhea.   Genitourinary: Negative for dysuria. Musculoskeletal: Negative for back pain. Skin: Negative for rash. Neurological: Negative for headaches, focal weakness or numbness.  ____________________________________________   PHYSICAL EXAM:  VITAL SIGNS: ED Triage Vitals  Enc Vitals Group     BP 12/19/20 1714 (!) 146/68     Pulse Rate 12/19/20 1714 68     Resp --      Temp 12/19/20  1714 98.2 F (36.8 C)     Temp Source 12/19/20 1714 Oral     SpO2 12/19/20 1705 96 %     Weight 12/19/20 1709 109 lb (49.4 kg)     Height 12/19/20 1709 5' 7.5" (1.715 m)     Head Circumference --      Peak Flow --      Pain Score 12/19/20 1709 0   Constitutional: Awake and alert. Not completely oriented.  Eyes: Conjunctivae are normal.  ENT      Head: Normocephalic and atraumatic.      Nose: No congestion/rhinnorhea.      Mouth/Throat: Mucous membranes are moist.      Neck: No stridor. Hematological/Lymphatic/Immunilogical: No cervical lymphadenopathy. Cardiovascular: Normal rate, regular rhythm.  No murmurs, rubs, or gallops.  Respiratory: Normal respiratory effort without tachypnea nor retractions. Diminished breath sounds in the bases.  Gastrointestinal: Soft and non tender. No rebound. No guarding.  Genitourinary: Deferred Musculoskeletal: Normal range of motion in all extremities. No lower extremity  edema. Neurologic:  Dementia. Awake and alert. Not completely oriented. Moving all extremities.  Skin:  Skin is warm, dry and intact. No rash noted. Psychiatric: Mood and affect are normal. Speech and behavior are normal. Patient exhibits appropriate insight and judgment.  ____________________________________________    LABS (pertinent positives/negatives)  COVID negative Trop hs 7 BMP wnl except k 3.3 CBC wbc 11.2, hgb 11.3, plt 667  ____________________________________________   EKG  I, Phineas Semen, attending physician, personally viewed and interpreted this EKG  EKG Time: 1704 Rate: 71 Rhythm: sinus rhythm Axis: normal Intervals: qtc 469 QRS: narrow ST changes: no st elevation, t wave inversion v1, v2, v3 Impression: abnormal ekg  ____________________________________________    RADIOLOGY   CXR Small left pleural effusion and opacity. Increasing right airspace opacities.   ____________________________________________   PROCEDURES  Procedures  ____________________________________________   INITIAL IMPRESSION / ASSESSMENT AND PLAN / ED COURSE  Pertinent labs & imaging results that were available during my care of the patient were reviewed by me and considered in my medical decision making (see chart for details).  Patient presented to the emergency department today because of concerns for shortness of breath.  Patient did have a recent admission to the hospital for pneumonia.  Work-up today is discerning for worsening pneumonia with worsening chest x-ray findings as well as white blood cell count that is starting to elevate again.  Because of this do think patient would benefit from readmission.  Will start IV antibiotics and talk to the hospital service.  ____________________________________________   FINAL CLINICAL IMPRESSION(S) / ED DIAGNOSES  Final diagnoses:  Shortness of breath     Note: This dictation was prepared with Dragon dictation. Any  transcriptional errors that result from this process are unintentional     Phineas Semen, MD 12/19/20 2257

## 2020-12-19 NOTE — H&P (Addendum)
History and Physical   [name]:[id number] DOB: 21-Sep-1940 DOA: 12/19/2020  PCP: Patient, No Pcp Per (Inactive)   Patient coming from:  house.  Chief Complaint: Shortness of breath  HPI: [name] is a 80 y.o. female with medical history significant of bipolar, dementia, insomnia, gait disorder, PAD, hyperlipidemia, GERD, chronic pain who presents with shortness of breath.  History obtained with the assistance of chart review due to patient's dementia.  Attempted to reach out to her caseworker as she is a ward of [facility], but there was no answer.  Reports patient began having shortness of breath last night at her facility.  This was persistent into today and so EMS was called.  Patient was found to be saturating around 97% on room air when EMS arrived and she was placed on 4 L nasal cannula with improvement to saturations of around 94%.  Per reports that she was on amoxicillin for UTI.  Of note, she was recently admitted from 4/1-4/5 for similar symptoms and pneumonia.  Discharge summary states she was discharged on 1 L but it was not reported whether or not she had come off of this.  She was treated with ceftriaxone and azithromycin and discharged on amoxicillin.  She denies fevers, chills, chest pain, abdominal pain.   ED Course: Vital signs in the ED significant for initially requiring 4 L to maintain saturations has been weaned down to 1 to 2 L.  Vital signs otherwise stable.  Lab work-up showed BMP with potassium 3.3.  CBC showed leukocytosis to 11.2, hemoglobin of 11.3, platelets 667.  Troponin negative x2.  Respiratory panel flu COVID negative.  Blood cultures pending.  Chest x-ray showed small left effusion and left-sided basilar opacity somewhat increased from 1 week ago.  Also showed increasing right patchy opacities.  Patient received vancomycin and cefepime in the ED.  Review of Systems: As per HPI otherwise all other systems reviewed and are  negative.  Past Medical History:  Diagnosis Date  . Alzheimer's dementia (HCC)   . Insomnia   . Low back pain   . Vomiting     Past Surgical History:  Procedure Laterality Date  . HIP ARTHROPLASTY Left 09/22/2019   Procedure: ARTHROPLASTY BIPOLAR HIP (HEMIARTHROPLASTY) RIGHT;  Surgeon: Christena Flake, MD;  Location: [facility] ORS;  Service: Orthopedics;  Laterality: Left;  Marland Kitchen VAGINAL HYSTERECTOMY      Social History  reports that she has quit smoking. She has never used smokeless tobacco. She reports previous alcohol use. She reports previous drug use.  No Known Allergies  History reviewed. No pertinent family history. Unknown, patient has dementia and family history is unknown.  Prior to Admission medications   Medication Sig Start Date End Date Taking? Authorizing Provider  acetaminophen (TYLENOL) 500 MG tablet Take 500 mg by mouth every 4 (four) hours as needed for mild pain or fever.     [provider]  alum & mag hydroxide-simeth (MAALOX/MYLANTA) 200-200-20 MG/5ML suspension Take 30 mLs by mouth 4 (four) times daily as needed for indigestion or heartburn.    [provider]  amoxicillin (AMOXIL) 500 MG capsule Take 2 capsules (1,000 mg total) by mouth 3 (three) times daily for 2 days. 12/17/20 12/19/20  Wouk, Wilfred Curtis, MD  aspirin 81 MG chewable tablet Chew by mouth daily.    [provider]  calcium carbonate (OSCAL) 1500 (600 Ca) MG TABS tablet Take 1 tablet by mouth daily. 12/05/20   [provider]  cholecalciferol (VITAMIN D3) 25 MCG (1000 UT) tablet Take 1,000 Units by mouth daily.    [provider]  divalproex (DEPAKOTE SPRINKLE) 125 MG capsule Take 1 capsule by mouth daily. 11/18/20   [provider]  divalproex (DEPAKOTE) 125 MG DR tablet Take 125 mg by mouth 2 (two) times daily.    [provider]  DULoxetine (CYMBALTA) 60 MG capsule Take 60 mg by mouth daily.    [provider]  enoxaparin (LOVENOX) 40  MG/0.4ML injection Inject 0.4 mLs (40 mg total) into the skin daily. 09/24/19   Anson Oregon, PA-C  eszopiclone (LUNESTA) 2 MG TABS tablet Take 2 mg by mouth at bedtime. Take immediately before bedtime    [provider]  feeding supplement, ENSURE ENLIVE, (ENSURE ENLIVE) LIQD Take 237 mLs by mouth 2 (two) times daily between meals. 09/27/19   Rolly Salter, MD  guaifenesin (ROBITUSSIN) 100 MG/5ML syrup Take 200 mg by mouth every 6 (six) hours as needed for cough.    [provider]  HYDROcodone-acetaminophen (NORCO/VICODIN) 5-325 MG tablet Take 1-2 tablets by mouth every 4 (four) hours as needed for moderate pain (pain score 4-6). Patient not taking: Reported on 12/14/2020 09/24/19   Anson Oregon, PA-C  lactulose, encephalopathy, (CHRONULAC) 10 GM/15ML SOLN Take 30 g by mouth daily.    [provider]  lamoTRIgine (LAMICTAL) 25 MG tablet Take 1 tablet by mouth daily.    [provider]  loperamide (IMODIUM) 2 MG capsule Take 2 mg by mouth every 3 (three) hours as needed for diarrhea or loose stools.    [provider]  magnesium hydroxide (MILK OF MAGNESIA) 400 MG/5ML suspension Take 30 mLs by mouth at bedtime as needed for mild constipation or moderate constipation.    [provider]  Menthol-Zinc Oxide 0.44-20.625 % OINT Apply 1 application topically 4 (four) times daily as needed (skin irritation).    [provider]  mirtazapine (REMERON) 30 MG tablet Take 30 mg by mouth daily.    [provider]  Multiple Vitamins-Minerals (MULTIVITAMIN ADULT, MINERALS,) TABS Take 1 tablet by mouth daily.    [provider]  neomycin-bacitracin-polymyxin (NEOSPORIN) ointment Apply 1 application topically 4 (four) times daily as needed for wound care. Patient not taking: No sig reported    [provider]  OLANZapine (ZYPREXA) 5 MG tablet Take 5 mg by mouth at bedtime.    [provider]  omeprazole  (PRILOSEC) 20 MG capsule Take 20 mg by mouth daily before breakfast.    [provider]  ondansetron (ZOFRAN-ODT) 4 MG disintegrating tablet Take 4 mg by mouth every 6 (six) hours as needed for nausea or vomiting.    [provider]  polyethylene glycol (MIRALAX / GLYCOLAX) 17 g packet Take 17 g by mouth at bedtime.    [provider]  rivastigmine (EXELON) 6 MG capsule Take 6 mg by mouth 2 (two) times daily.    [provider]  rosuvastatin (CRESTOR) 10 MG tablet Take 10 mg by mouth daily.    [provider]  senna-docusate (SENOKOT-S) 8.6-50 MG tablet Take 2 tablets by mouth 2 (two) times daily.    [provider]  traZODone (DESYREL) 50 MG tablet Take 50 mg by mouth at bedtime.    [provider]  vortioxetine HBr (TRINTELLIX) 20 MG TABS tablet Take 20 mg by mouth daily. Patient not taking: No sig reported    [provider]    Physical Exam: Vitals:  12/19/20 1830 12/19/20 1930 12/19/20 2000 12/19/20 2045  BP: 131/65 136/68 (!) 143/76 (!) 144/64  Pulse: 70 71 73 64  Resp: 16 18 19 17   Temp:      TempSrc:      SpO2: 95% 98% 96% 98%  Weight:      Height:       Physical Exam Constitutional:      General: She is not in acute distress.    Appearance: Normal appearance.     Comments: Thin elderly female, reporting still feeling short of breath.  HENT:     Head: Normocephalic and atraumatic.     Mouth/Throat:     Mouth: Mucous membranes are moist.     Pharynx: Oropharynx is clear.  Eyes:     Extraocular Movements: Extraocular movements intact.     Pupils: Pupils are equal, round, and reactive to light.  Cardiovascular:     Rate and Rhythm: Normal rate and regular rhythm.     Pulses: Normal pulses.     Heart sounds: Normal heart sounds.  Pulmonary:     Effort: Pulmonary effort is normal. No respiratory distress.     Comments: Distant breath sounds throughout, further decreased at bilateral  bases. Abdominal:     General: Bowel sounds are normal. There is no distension.     Palpations: Abdomen is soft.     Tenderness: There is no abdominal tenderness.  Musculoskeletal:        General: No swelling or deformity.  Skin:    General: Skin is warm and dry.  Neurological:     General: No focal deficit present.     Mental Status: Mental status is at baseline.    Labs on Admission: I have personally reviewed following labs and imaging studies  CBC: Recent Labs  Lab 12/13/20 0744 12/14/20 0431 12/15/20 0621 12/16/20 0425 12/19/20 1711  WBC 14.2* 11.8* 8.3 10.1 11.2*  NEUTROABS  --   --   --   --  7.7  HGB 10.8* 9.9* 10.4* 9.7* 11.3*  HCT 33.5* 32.0* 33.5* 30.8* 35.4*  MCV 92.5 95.2 93.3 91.1 90.1  PLT 476* 494* 531* 547* 667*    Basic Metabolic Panel: Recent Labs  Lab 12/13/20 0744 12/14/20 0431 12/15/20 0621 12/16/20 0425 12/19/20 1711  NA 137 141 140 141 142  K 3.7 3.9 4.0 3.8 3.3*  CL 108 111 111 108 106  CO2 23 26 25 25 26   GLUCOSE 89 87 80 89 90  BUN 15 18 15 12 18   CREATININE 0.55 0.47 0.56 0.46 0.58  CALCIUM 8.5* 8.6* 8.5* 8.6* 9.2  MG 1.2* 1.8  --  1.2*  --   PHOS 3.3 2.5  --  2.5  --     GFR: Estimated Creatinine Clearance: 43.7 mL/min (by C-G formula based on SCr of 0.58 mg/dL).  Liver Function Tests: No results for input(s): AST, ALT, ALKPHOS, BILITOT, PROT, ALBUMIN in the last 168 hours.  Urine analysis:    Component Value Date/Time   COLORURINE YELLOW (A) 12/12/2020 1628   APPEARANCEUR CLEAR (A) 12/12/2020 1628   APPEARANCEUR Clear 10/22/2012 0404   LABSPEC >1.046 (H) 12/12/2020 1628   LABSPEC 1.035 10/22/2012 0404   PHURINE 5.0 12/12/2020 1628   GLUCOSEU NEGATIVE 12/12/2020 1628   GLUCOSEU Negative 10/22/2012 0404   HGBUR NEGATIVE 12/12/2020 1628   BILIRUBINUR NEGATIVE 12/12/2020 1628   BILIRUBINUR Negative 10/22/2012 0404   KETONESUR 5 (A) 12/12/2020 1628   PROTEINUR NEGATIVE 12/12/2020 1628   UROBILINOGEN 0.2  06/14/2011 1805    NITRITE NEGATIVE 12/12/2020 1628   LEUKOCYTESUR SMALL (A) 12/12/2020 1628   LEUKOCYTESUR Negative 10/22/2012 0404    Radiological Exams on Admission: DG Chest Portable 1 View  Result Date: 12/19/2020 CLINICAL DATA:  Shortness of breath.  Difficulty breathing. EXAM: PORTABLE CHEST 1 VIEW COMPARISON:  Radiograph and CT 12/12/2020. FINDINGS: Unchanged heart size and mediastinal contours. Aortic atherosclerosis. Small left pleural effusion and basilar opacity, slightly progressed from imaging 1 week ago. Increasing patchy right basilar opacity. Increasing peribronchial thickening. No pneumothorax. No acute osseous abnormalities are seen. IMPRESSION: Small left pleural effusion and basilar opacity, slightly progressed from imaging 1 week ago. Increasing patchy right basilar opacity may be atelectasis or pneumonia. Given distribution, consider aspiration. Electronically Signed   By: Narda RutherfordMelanie  Sanford M.D.   On: 12/19/2020 18:19    EKG: Independently reviewed. Sinus rhythm at 71 bpm.  Assessment/Plan Principal Problem:   Acute respiratory failure with hypoxia (HCC) Active Problems:   Bipolar disorder (HCC)   Dementia with behavioral disturbance (HCC)   HCAP (healthcare-associated pneumonia)   PAD (peripheral artery disease) (HCC)   HLD (hyperlipidemia)   Insomnia   GERD (gastroesophageal reflux disease)  Pneumonia Acute hypoxic respiratory failure > Patient recently admitted for pneumonia discharged 3 days ago.  Was reportedly discharged on amoxicillin which she likely completed and 1 L of oxygen which it is unclear if she has continued to require. > Saturating 87% on room air for EMS improvement to 94% on 4 L has been able to be weaned down to 1 to 2 L in the ED. > Received cefepime and vancomycin in the ED for concern of failure of previous antibiotics versus resistant organism. > Also noted to have leukocytosis to 11.2 and chest x-ray with similar but increased opacities at the left base  and right side. > Oxygenating well on current and oxygen but continues to report some shortness of breath - We will switch to ceftriaxone and azithromycin for now pending MRSA swab - MRSA swab, is covered with vancomycin received in ED for now. - Trend fever curve and white count - Check Procalcitonin - Duo nebs PRN - Wean oxygen as tolerated  Hypokalemia > Potassium 3.3 in the ED - K-Dur 40 mEq - Check magnesium  Thrombocytosis > Likely reactive, will continue to monitor - Trend CBC  Normocytic Anemia > Hemoglobin 11.3 which is near/above her baseline - Trend CBC  Reported recent UTI - We will recheck urinalysis and urine cultures - Covered with antibiotics for pneumonia  Dementia Bipolar Insomnia - Continue home lamotrigine, olanzapine, trazodone, mirtazapine, Depakote, rivastigmine, duloxetine  GERD - Continue home PPI  PAD Hyperlipidemia - Continue home rosuvastatin and aspirin  DVT prophylaxis: Lovenox  Code Status:   DNR  Family Communication:  [place] [facility] but there was no answer.   Disposition Plan:   Patient is from:  [facility]  Anticipated DC to:  [facility]  Anticipated DC date:  1 to 3 days  Anticipated DC barriers: None  Consults called:  None  Admission status:  Observation, MedSurg with telemetry  Severity of Illness: The appropriate patient status for this patient is OBSERVATION. Observation status is judged to be reasonable and necessary in order to provide the required intensity of service to ensure the patient's safety. The patient's presenting symptoms, physical exam findings, and initial radiographic and laboratory data in the context of their medical condition is felt to place them at decreased risk for  further clinical deterioration. Furthermore, it is anticipated that the patient will be medically stable for discharge from the hospital within 2 midnights of admission. The following factors  support the patient status of observation.   " The patient's presenting symptoms include shortness of breath. " The physical exam findings include distant breath sounds with decreased sounds at bases. " The initial radiographic and laboratory data are leukocytosis to 11.2, hemoglobin 1.3, platelets 667, potassium 3.3.Marland Kitchen   Synetta Fail MD [facility]  How to contact the [facility] Attending or Consulting provider 7A - 7P or covering provider during after hours 7P -7A, for this patient?   1. Check the care team in [facility] and look for a) attending/consulting [facility] provider listed and b) the [facility] team listed 2. Log into [email or web address] and use [facility] universal password to access. If you do not have the password, please contact the hospital operator. 3. Locate the [facility] provider you are looking for under [facility] and page to a number that you can be directly reached. 4. If you still have difficulty reaching the provider, please page the [facility] (Director on Call) for the Hospitalists listed on amion for assistance.  12/19/2020, 9:13 PM

## 2020-12-19 NOTE — ED Notes (Signed)
Continuous cardiac and pulse ox monitoring.  

## 2020-12-20 DIAGNOSIS — J9621 Acute and chronic respiratory failure with hypoxia: Secondary | ICD-10-CM | POA: Diagnosis present

## 2020-12-20 DIAGNOSIS — E162 Hypoglycemia, unspecified: Secondary | ICD-10-CM | POA: Diagnosis present

## 2020-12-20 DIAGNOSIS — J189 Pneumonia, unspecified organism: Secondary | ICD-10-CM | POA: Diagnosis present

## 2020-12-20 DIAGNOSIS — R269 Unspecified abnormalities of gait and mobility: Secondary | ICD-10-CM | POA: Diagnosis present

## 2020-12-20 DIAGNOSIS — K219 Gastro-esophageal reflux disease without esophagitis: Secondary | ICD-10-CM | POA: Diagnosis present

## 2020-12-20 DIAGNOSIS — Z7982 Long term (current) use of aspirin: Secondary | ICD-10-CM | POA: Diagnosis not present

## 2020-12-20 DIAGNOSIS — Z66 Do not resuscitate: Secondary | ICD-10-CM | POA: Diagnosis present

## 2020-12-20 DIAGNOSIS — Z20822 Contact with and (suspected) exposure to covid-19: Secondary | ICD-10-CM | POA: Diagnosis present

## 2020-12-20 DIAGNOSIS — J9601 Acute respiratory failure with hypoxia: Secondary | ICD-10-CM | POA: Diagnosis not present

## 2020-12-20 DIAGNOSIS — R627 Adult failure to thrive: Secondary | ICD-10-CM | POA: Diagnosis present

## 2020-12-20 DIAGNOSIS — R0600 Dyspnea, unspecified: Secondary | ICD-10-CM | POA: Diagnosis not present

## 2020-12-20 DIAGNOSIS — F0281 Dementia in other diseases classified elsewhere with behavioral disturbance: Secondary | ICD-10-CM | POA: Diagnosis present

## 2020-12-20 DIAGNOSIS — G8929 Other chronic pain: Secondary | ICD-10-CM | POA: Diagnosis present

## 2020-12-20 DIAGNOSIS — R9431 Abnormal electrocardiogram [ECG] [EKG]: Secondary | ICD-10-CM | POA: Diagnosis present

## 2020-12-20 DIAGNOSIS — Z87891 Personal history of nicotine dependence: Secondary | ICD-10-CM | POA: Diagnosis not present

## 2020-12-20 DIAGNOSIS — F319 Bipolar disorder, unspecified: Secondary | ICD-10-CM | POA: Diagnosis not present

## 2020-12-20 DIAGNOSIS — Z96642 Presence of left artificial hip joint: Secondary | ICD-10-CM | POA: Diagnosis present

## 2020-12-20 DIAGNOSIS — E876 Hypokalemia: Secondary | ICD-10-CM | POA: Diagnosis present

## 2020-12-20 DIAGNOSIS — I739 Peripheral vascular disease, unspecified: Secondary | ICD-10-CM | POA: Diagnosis present

## 2020-12-20 DIAGNOSIS — F0391 Unspecified dementia with behavioral disturbance: Secondary | ICD-10-CM | POA: Diagnosis not present

## 2020-12-20 DIAGNOSIS — G309 Alzheimer's disease, unspecified: Secondary | ICD-10-CM | POA: Diagnosis present

## 2020-12-20 DIAGNOSIS — G4700 Insomnia, unspecified: Secondary | ICD-10-CM | POA: Diagnosis present

## 2020-12-20 DIAGNOSIS — J90 Pleural effusion, not elsewhere classified: Secondary | ICD-10-CM | POA: Diagnosis present

## 2020-12-20 DIAGNOSIS — N390 Urinary tract infection, site not specified: Secondary | ICD-10-CM | POA: Diagnosis present

## 2020-12-20 DIAGNOSIS — E785 Hyperlipidemia, unspecified: Secondary | ICD-10-CM | POA: Diagnosis present

## 2020-12-20 DIAGNOSIS — Z681 Body mass index (BMI) 19 or less, adult: Secondary | ICD-10-CM | POA: Diagnosis not present

## 2020-12-20 DIAGNOSIS — Z79899 Other long term (current) drug therapy: Secondary | ICD-10-CM | POA: Diagnosis not present

## 2020-12-20 LAB — URINALYSIS, ROUTINE W REFLEX MICROSCOPIC
Bacteria, UA: NONE SEEN
Bilirubin Urine: NEGATIVE
Glucose, UA: NEGATIVE mg/dL
Hgb urine dipstick: NEGATIVE
Ketones, ur: 20 mg/dL — AB
Nitrite: NEGATIVE
Protein, ur: NEGATIVE mg/dL
Specific Gravity, Urine: 1.017 (ref 1.005–1.030)
Squamous Epithelial / HPF: >50 — ABNORMAL HIGH (ref 0–5)
pH: 6 (ref 5.0–8.0)

## 2020-12-20 LAB — GLUCOSE, CAPILLARY
Glucose-Capillary: 77 mg/dL (ref 70–99)
Glucose-Capillary: 101 mg/dL — ABNORMAL HIGH (ref 70–99)
Glucose-Capillary: 109 mg/dL — ABNORMAL HIGH (ref 70–99)
Glucose-Capillary: 86 mg/dL (ref 70–99)
Glucose-Capillary: 86 mg/dL (ref 70–99)

## 2020-12-20 LAB — CBC
HCT: 29.7 % — ABNORMAL LOW (ref 36.0–46.0)
Hemoglobin: 9.5 g/dL — ABNORMAL LOW (ref 12.0–15.0)
MCH: 29.1 pg (ref 26.0–34.0)
MCHC: 32 g/dL (ref 30.0–36.0)
MCV: 91.1 fL (ref 80.0–100.0)
Platelets: 531 10*3/uL — ABNORMAL HIGH (ref 150–400)
RBC: 3.26 MIL/uL — ABNORMAL LOW (ref 3.87–5.11)
RDW: 13.2 % (ref 11.5–15.5)
WBC: 7.9 10*3/uL (ref 4.0–10.5)
nRBC: 0 % (ref 0.0–0.2)

## 2020-12-20 LAB — PROCALCITONIN: Procalcitonin: <0.1 ng/mL

## 2020-12-20 LAB — COMPREHENSIVE METABOLIC PANEL
ALT: 15 U/L (ref 0–44)
AST: 18 U/L (ref 15–41)
Albumin: 2.3 g/dL — ABNORMAL LOW (ref 3.5–5.0)
Alkaline Phosphatase: 49 U/L (ref 38–126)
Anion gap: 7 (ref 5–15)
BUN: 13 mg/dL (ref 8–23)
CO2: 23 mmol/L (ref 22–32)
Calcium: 8.5 mg/dL — ABNORMAL LOW (ref 8.9–10.3)
Chloride: 110 mmol/L (ref 98–111)
Creatinine, Ser: 0.63 mg/dL (ref 0.44–1.00)
GFR, Estimated: >60 mL/min (ref >60)
Glucose, Bld: 74 mg/dL (ref 70–99)
Potassium: 3.8 mmol/L (ref 3.5–5.1)
Sodium: 140 mmol/L (ref 135–145)
Total Bilirubin: 0.7 mg/dL (ref 0.3–1.2)
Total Protein: 4.9 g/dL — ABNORMAL LOW (ref 6.5–8.1)

## 2020-12-20 LAB — MRSA PCR SCREENING: MRSA by PCR: NEGATIVE

## 2020-12-20 MED ORDER — DEXTROSE IN LACTATED RINGERS 5 % IV SOLN: INTRAVENOUS | Status: DC

## 2020-12-20 MED ORDER — ORAL CARE MOUTH RINSE
15.0000 mL | Freq: Two times a day (BID) | OROMUCOSAL | Status: DC
  Administered 2020-12-20 – 2020-12-25 (×8): 15 mL via OROMUCOSAL

## 2020-12-20 MED ORDER — SODIUM CHLORIDE 0.9 % IV SOLN
INTRAVENOUS | Status: DC | PRN
  Administered 2020-12-20: 500 mL via INTRAVENOUS

## 2020-12-20 MED ORDER — POTASSIUM CHLORIDE CRYS ER 20 MEQ PO TBCR
40.0000 meq | EXTENDED_RELEASE_TABLET | Freq: Once | ORAL | Status: AC
  Administered 2020-12-20: 40 meq via ORAL
  Filled 2020-12-20: qty 2

## 2020-12-20 MED ORDER — MAGNESIUM SULFATE 2 GM/50ML IV SOLN
2.0000 g | Freq: Two times a day (BID) | INTRAVENOUS | Status: AC
  Administered 2020-12-20 (×2): 2 g via INTRAVENOUS
  Filled 2020-12-20 (×2): qty 50

## 2020-12-20 NOTE — Progress Notes (Signed)
PROGRESS NOTE    BESAN KETCHEM  TGG:[id number] DOB: 28-Jan-1941 DOA: 12/19/2020 PCP: Patient, No Pcp Per (Inactive)    Brief Narrative:  80 year old female from assisted living facility with history of bipolar disorder, dementia, insomnia, peripheral arterial disease, GERD and chronic pain syndrome who was recently admitted to the hospital brought back to the emergency room with shortness of breath found at her assisted living place.  Reportedly patient was on amoxicillin for UTI.  She was recently admitted to the hospital 4/1-4/5 for similar symptoms and treated for pneumonia.  She was discharged on 2 L oxygen.  Patient poor historian.  In the emergency room, on 2 L oxygen.  Potassium 3.3.  Otherwise electrolytes normal.  Chest x-ray with a small left sided effusion and left basilar opacity more prominent than a week ago.  Increasing right patchy opacities.  Was given vancomycin cefepime in the ER due to recent hospitalization and admitted.   Assessment & Plan:   Principal Problem:   Acute respiratory failure with hypoxia (HCC) Active Problems:   Bipolar disorder (HCC)   Dementia with behavioral disturbance (HCC)   HCAP (healthcare-associated pneumonia)   PAD (peripheral artery disease) (HCC)   HLD (hyperlipidemia)   Insomnia   GERD (gastroesophageal reflux disease)  Pneumonia, present on admission.  Suspect both gram-positive and gram-negative.  Acute hypoxemic respiratory failure, acute on chronic and probably new baseline. Recently completed treatment.  Unsure whether she has recurrent pneumonia all evolving changes on her x-ray.  Continue Rocephin and azithromycin today until further data available.  Procalcitonin was normal. Seen by speech on 4/2, no evidence of aspiration. Will check 2D echocardiogram to assess any congestive heart failure. We will try incentive spirometry, mobility, breathing exercises and bronchodilator therapies.  Work with PT OT.  Monitor in the hospital today  due to significant symptoms.  Hypokalemia: Replaced and improved.    Severe hypomagnesemia: Magnesium level 1.1 on presentation.  Not replaced yet.  Will prescribe IV magnesium 2 g 2 times a day today, recheck level tomorrow morning.  Also check phosphorus.  Hypoglycemia: Due to poor oral intake.  On maintenance dextrose today.  Continue.  Dementia/bipolar disorder and insomnia: On multiple medications including lamotrigine, olanzapine, trazodone, mirtazapine, Depakote, rivastigmine and duloxetine that we will continue.  Hypoglycemia/recurrent hospitalization/severe electrolyte abnormalities due to poor oral intake and adult failure to thrive: Looks like patient has recently worsening clinical condition.  Will consult palliative care to continue goal of care discussion and to explore if patient is eligible for hospice level of care.   DVT prophylaxis: enoxaparin (LOVENOX) injection 40 mg Start: 12/19/20 2200   Code Status: DNR Family Communication: None at the bedside Disposition Plan: Status is: Observation  The patient will require care spanning > 2 midnights and should be moved to inpatient because: Persistent severe electrolyte disturbances and IV treatments appropriate due to intensity of illness or inability to take PO  Dispo: The patient is from: ALF              Anticipated d/c is to: ALF              Patient currently is not medically stable to d/c.   Difficult to place patient No  Patient with magnesium of 1.1 that needs IV replacement and monitoring in the hospital.  She also needs IV antibiotics pending final cultures.  Will need inpatient hospitalization.       Consultants:   Palliative  Procedures:   None  Antimicrobials:  Anti-infectives (From  admission, onward)   Start     Dose/Rate Route Frequency Ordered Stop   12/20/20 1900  cefTRIAXone (ROCEPHIN) 2 g in sodium chloride 0.9 % 100 mL IVPB        2 g 200 mL/hr over 30 Minutes Intravenous Every 24 hours  12/19/20 2058 12/25/20 1859   12/19/20 2100  azithromycin (ZITHROMAX) 500 mg in sodium chloride 0.9 % 250 mL IVPB        500 mg 250 mL/hr over 60 Minutes Intravenous Every 24 hours 12/19/20 2058 12/24/20 2059   12/19/20 1915  vancomycin (VANCOCIN) IVPB 1000 mg/200 mL premix        1,000 mg 200 mL/hr over 60 Minutes Intravenous  Once 12/19/20 1906 12/19/20 2100   12/19/20 1915  ceFEPIme (MAXIPIME) 2 g in sodium chloride 0.9 % 100 mL IVPB        2 g 200 mL/hr over 30 Minutes Intravenous  Once 12/19/20 1906 12/19/20 2021         Subjective: Patient seen and examined.  Poor historian.  Overnight events noted.  Her blood sugar was less than 70 and currently remains on dextrose drip.  Patient herself tells me that she lives in an assisted living facility and sent here because of shortness of breath.  Overnight afebrile.  Mostly on room air.  Objective: Vitals:   12/20/20 0001 12/20/20 0440 12/20/20 0840 12/20/20 0841  BP: (!) 128/57 125/72 (!) 112/57   Pulse: 70 72 60 61  Resp: Temp: 98.4 F (36.9 C) 98.4 F (36.9 C) (!) 97.4 F (36.3 C)   TempSrc:      SpO2: 98% 96% 97% 97%  Weight:      Height:        Intake/Output Summary (Last 24 hours) at 12/20/2020 1039 Last data filed at 12/20/2020 0640 Gross per 24 hour  Intake 300 ml  Output 299 ml  Net 1 ml   Filed Weights   12/19/20 1709 12/19/20 2229  Weight: 49.4 kg 55.3 kg    Examination:  General exam: Chronically sick looking debilitated and frail female complains of shortness of breath.  Looks fairly comfortable at rest. Respiratory system: Mostly conducted airway sounds.SpO2: 97 % O2 Flow Rate (L/min): 2 L/min  Cardiovascular system: S1 & S2 heard, RRR. No JVD, murmurs, rubs, gallops or clicks. No pedal edema. Gastrointestinal system: Abdomen is nondistended, soft and nontender. No organomegaly or masses felt. Normal bowel sounds heard. Central nervous system: Alert and oriented x2-3.  Generalized  weakness. Extremities: Symmetric 5 x 5 power. Skin: No rashes, lesions or ulcers Psychiatry: Judgement and insight appear normal. Mood is anxious and affect is flat.    Data Reviewed: I have personally reviewed following labs and imaging studies  CBC: Recent Labs  Lab 12/14/20 0431 12/15/20 0621 12/16/20 0425 12/19/20 1711 12/20/20 0532  WBC 11.8* 8.3 10.1 11.2* 7.9  NEUTROABS  --   --   --  7.7  --   HGB 9.9* 10.4* 9.7* 11.3* 9.5*  HCT 32.0* 33.5* 30.8* 35.4* 29.7*  MCV 95.2 93.3 91.1 90.1 91.1  PLT 494* 531* 547* 667* 531*   Basic Metabolic Panel: Recent Labs  Lab 12/14/20 0431 12/15/20 0621 12/16/20 0425 12/19/20 1711 12/20/20 0532  NA 141 140 141 142 140  K 3.9 4.0 3.8 3.3* 3.8  CL 111 111 108 106 110  CO2 GLUCOSE 87 80 89 90 74  BUN 13  CREATININE 0.47 0.56 0.46 0.58 0.63  CALCIUM 8.6* 8.5* 8.6* 9.2 8.5*  MG 1.8  --  1.2* 1.1*  --   PHOS 2.5  --  2.5  --   --    GFR: Estimated Creatinine Clearance: 49 mL/min (by C-G formula based on SCr of 0.63 mg/dL). Liver Function Tests: Recent Labs  Lab 12/20/20 0532  AST 18  ALT 15  ALKPHOS 49  BILITOT 0.7  PROT 4.9*  ALBUMIN 2.3*   No results for input(s): LIPASE, AMYLASE in the last 168 hours. Recent Labs  Lab 12/15/20 0621  AMMONIA 22   Coagulation Profile: No results for input(s): INR, PROTIME in the last 168 hours. Cardiac Enzymes: No results for input(s): CKTOTAL, CKMB, CKMBINDEX, TROPONINI in the last 168 hours. BNP (last 3 results) No results for input(s): PROBNP in the last 8760 hours. HbA1C: No results for input(s): HGBA1C in the last 72 hours. CBG: Recent Labs  Lab 12/15/20 1126 12/20/20 0741  GLUCAP 73 77   Lipid Profile: No results for input(s): CHOL, HDL, LDLCALC, TRIG, CHOLHDL, LDLDIRECT in the last 72 hours. Thyroid Function Tests: No results for input(s): TSH, T4TOTAL, FREET4, T3FREE, THYROIDAB in the last 72 hours. Anemia Panel: No results for  input(s): VITAMINB12, FOLATE, FERRITIN, TIBC, IRON, RETICCTPCT in the last 72 hours. Sepsis Labs: Recent Labs  Lab 12/19/20 1711 12/20/20 0532  PROCALCITON <0.10 <0.10    Recent Results (from the past 240 hour(s))  Resp Panel by RT-PCR (Flu A&B, Covid) Nasopharyngeal Swab     Status: None   Collection Time: 12/12/20  5:27 PM   Specimen: Nasopharyngeal Swab; Nasopharyngeal(NP) swabs in vial transport medium  Result Value Ref Range Status   SARS Coronavirus 2 by RT PCR NEGATIVE NEGATIVE Final    Comment: (NOTE) SARS-CoV-2 target nucleic acids are NOT DETECTED.  The SARS-CoV-2 RNA is generally detectable in upper respiratory specimens during the acute phase of infection. The lowest concentration of SARS-CoV-2 viral copies this assay can detect is 138 copies/mL. A negative result does not preclude SARS-Cov-2 infection and should not be used as the sole basis for treatment or other patient management decisions. A negative result may occur with  improper specimen collection/handling, submission of specimen other than nasopharyngeal swab, presence of viral mutation(s) within the areas targeted by this assay, and inadequate number of viral copies(<138 copies/mL). A negative result must be combined with clinical observations, patient history, and epidemiological information. The expected result is Negative.  Fact Sheet for Patients:  BloggerCourse.com  Fact Sheet for Healthcare Providers:  SeriousBroker.it  This test is no t yet approved or cleared by the [place] FDA and  has been authorized for detection and/or diagnosis of SARS-CoV-2 by FDA under an Emergency Use Authorization (EUA). This EUA will remain  in effect (meaning this test can be used) for the duration of the COVID-19 declaration under Section 564(b)(1) of the Act, 21 U.S.C.section 360bbb-3(b)(1), unless the authorization is terminated  or revoked sooner.        Influenza A by PCR NEGATIVE NEGATIVE Final   Influenza B by PCR NEGATIVE NEGATIVE Final    Comment: (NOTE) The Xpert Xpress SARS-CoV-2/FLU/RSV plus assay is intended as an aid in the diagnosis of influenza from Nasopharyngeal swab specimens and should not be used as a sole basis for treatment. Nasal washings and aspirates are unacceptable for Xpert Xpress SARS-CoV-2/FLU/RSV testing.  Fact Sheet for Patients: BloggerCourse.com  Fact Sheet for Healthcare Providers: SeriousBroker.it  This test is not yet approved or  cleared by the [place] and has been authorized for detection and/or diagnosis of SARS-CoV-2 by FDA under an Emergency Use Authorization (EUA). This EUA will remain in effect (meaning this test can be used) for the duration of the COVID-19 declaration under Section 564(b)(1) of the Act, 21 U.S.C. section 360bbb-3(b)(1), unless the authorization is terminated or revoked.  Performed at [facility], [address] [place], [place] [address]   Blood culture (routine x 2)     Status: None   Collection Time: 12/12/20  5:27 PM   Specimen: BLOOD  Result Value Ref Range Status   Specimen Description BLOOD LEFT ANTECUBITAL  Final   Special Requests   Final    BOTTLES DRAWN AEROBIC AND ANAEROBIC Blood Culture results may not be optimal due to an inadequate volume of blood received in culture bottles   Culture   Final    NO GROWTH 5 DAYS Performed at [facility], [address] [place], [place] [address]    Report Status 12/17/2020 FINAL  Final  Blood culture (routine x 2)     Status: None   Collection Time: 12/12/20  5:27 PM   Specimen: BLOOD  Result Value Ref Range Status   Specimen Description BLOOD BLOOD RIGHT WRIST  Final   Special Requests   Final    BOTTLES DRAWN AEROBIC AND ANAEROBIC Blood Culture results may not be optimal due to an inadequate volume of blood received in culture bottles    Culture   Final    NO GROWTH 5 DAYS Performed at [facility], [address] [place], [place] [address]    Report Status 12/17/2020 FINAL  Final  MRSA PCR Screening     Status: None   Collection Time: 12/15/20  6:00 AM   Specimen: Nasopharyngeal  Result Value Ref Range Status   MRSA by PCR NEGATIVE NEGATIVE Final    Comment:        The GeneXpert MRSA Assay (FDA approved for NASAL specimens only), is one component of a comprehensive MRSA colonization surveillance program. It is not intended to diagnose MRSA infection nor to guide or monitor treatment for MRSA infections. Performed at [facility], [address] [place], [place] [address]   Resp Panel by RT-PCR (Flu A&B, Covid) Nasopharyngeal Swab     Status: None   Collection Time: 12/19/20  5:50 PM   Specimen: Nasopharyngeal Swab; Nasopharyngeal(NP) swabs in vial transport medium  Result Value Ref Range Status   SARS Coronavirus 2 by RT PCR NEGATIVE NEGATIVE Final    Comment: (NOTE) SARS-CoV-2 target nucleic acids are NOT DETECTED.  The SARS-CoV-2 RNA is generally detectable in upper respiratory specimens during the acute phase of infection. The lowest concentration of SARS-CoV-2 viral copies this assay can detect is 138 copies/mL. A negative result does not preclude SARS-Cov-2 infection and should not be used as the sole basis for treatment or other patient management decisions. A negative result may occur with  improper specimen collection/handling, submission of specimen other than nasopharyngeal swab, presence of viral mutation(s) within the areas targeted by this assay, and inadequate number of viral copies(<138 copies/mL). A negative result must be combined with clinical observations, patient history, and epidemiological information. The expected result is Negative.  Fact Sheet for Patients:  BloggerCourse.com  Fact Sheet for Healthcare Providers:   SeriousBroker.it  This test is no t yet approved or cleared by the [place] FDA and  has been authorized for detection and/or diagnosis of SARS-CoV-2  by FDA under an Emergency Use Authorization (EUA). This EUA will remain  in effect (meaning this test can be used) for the duration of the COVID-19 declaration under Section 564(b)(1) of the Act, 21 U.S.C.section 360bbb-3(b)(1), unless the authorization is terminated  or revoked sooner.       Influenza A by PCR NEGATIVE NEGATIVE Final   Influenza B by PCR NEGATIVE NEGATIVE Final    Comment: (NOTE) The Xpert Xpress SARS-CoV-2/FLU/RSV plus assay is intended as an aid in the diagnosis of influenza from Nasopharyngeal swab specimens and should not be used as a sole basis for treatment. Nasal washings and aspirates are unacceptable for Xpert Xpress SARS-CoV-2/FLU/RSV testing.  Fact Sheet for Patients: BloggerCourse.comhttps://www.fda.gov/media/152166/download  Fact Sheet for Healthcare Providers: SeriousBroker.ithttps://www.fda.gov/media/152162/download  This test is not yet approved or cleared by the [place] FDA and has been authorized for detection and/or diagnosis of SARS-CoV-2 by FDA under an Emergency Use Authorization (EUA). This EUA will remain in effect (meaning this test can be used) for the duration of the COVID-19 declaration under Section 564(b)(1) of the Act, 21 U.S.C. section 360bbb-3(b)(1), unless the authorization is terminated or revoked.  Performed at [facility], [address] [place], [place] [address]   Blood culture (routine x 2)     Status: None (Preliminary result)   Collection Time: 12/19/20  7:20 PM   Specimen: BLOOD  Result Value Ref Range Status   Specimen Description BLOOD  RIGHT FOREARM   Final   Special Requests   Final    BOTTLES DRAWN AEROBIC AND ANAEROBIC Blood Culture adequate volume   Culture   Final    NO GROWTH < 12 HOURS Performed at [facility], [address] [place], [place] [address]    Report Status PENDING  Incomplete  Blood culture (routine x 2)     Status: None (Preliminary result)   Collection Time: 12/19/20  7:30 PM   Specimen: BLOOD  Result Value Ref Range Status   Specimen Description BLOOD  LEFT FOREARM  Final   Special Requests   Final    BOTTLES DRAWN AEROBIC AND ANAEROBIC Blood Culture results may not be optimal due to an inadequate volume of blood received in culture bottles   Culture   Final    NO GROWTH < 12 HOURS Performed at [facility], [address] [place], [place] [address]    Report Status PENDING  Incomplete  MRSA PCR Screening     Status: None   Collection Time: 12/20/20  6:09 AM   Specimen: Nasal Mucosa; Nasopharyngeal  Result Value Ref Range Status   MRSA by PCR NEGATIVE NEGATIVE Final    Comment:        The GeneXpert MRSA Assay (FDA approved for NASAL specimens only), is one component of a comprehensive MRSA colonization surveillance program. It is not intended to diagnose MRSA infection nor to guide or monitor treatment for MRSA infections. Performed at [facility], [address] [place], [place] [address]          Radiology Studies: DG Chest Portable 1 View  Result Date: 12/19/2020 CLINICAL DATA:  Shortness of breath.  Difficulty breathing. EXAM: PORTABLE CHEST 1 VIEW COMPARISON:  Radiograph and CT 12/12/2020. FINDINGS: Unchanged heart size and mediastinal contours. Aortic atherosclerosis. Small left pleural effusion and basilar opacity, slightly progressed from imaging 1 week ago. Increasing patchy right basilar opacity. Increasing peribronchial thickening. No pneumothorax. No acute osseous abnormalities are seen. IMPRESSION: Small left pleural effusion and basilar  opacity, slightly progressed from imaging 1 week ago. Increasing patchy right basilar opacity may be atelectasis or pneumonia. Given distribution, consider aspiration. Electronically Signed   By: Narda Rutherford M.D.   On: 12/19/2020 18:19        Scheduled Meds: . aspirin  81 mg Oral Daily  . divalproex  125 mg Oral BID  . DULoxetine  60 mg Oral Daily  . enoxaparin (LOVENOX) injection  40 mg Subcutaneous Q24H  . feeding supplement  237 mL Oral BID BM  . lactulose  30 g Oral Daily  . lamoTRIgine  25 mg Oral Daily  . mouth rinse  15 mL Mouth Rinse BID  . mirtazapine  30 mg Oral Daily  . multivitamin with minerals  1 tablet Oral Daily  . OLANZapine  5 mg Oral QHS  . pantoprazole  40 mg Oral Daily  . rivastigmine  6 mg Oral BID  . rosuvastatin  10 mg Oral Daily  . senna-docusate  2 tablet Oral BID  . sodium chloride flush  3 mL Intravenous Q12H  . traZODone  50 mg Oral QHS   Continuous Infusions: . azithromycin 500 mg (12/19/20 2141)  . cefTRIAXone (ROCEPHIN)  IV    . dextrose 5% lactated ringers 75 mL/hr at 12/20/20 0800  . magnesium sulfate bolus IVPB       LOS: 0 days    Time spent: 35 minutes    Dorcas Carrow, MD [facility] Pager [phone]

## 2020-12-20 NOTE — Plan of Care (Signed)
  Problem: Clinical Measurements: Goal: Respiratory complications will improve Outcome: Progressing   Problem: Nutrition: Goal: Adequate nutrition will be maintained Outcome: Progressing   

## 2020-12-20 NOTE — Progress Notes (Signed)
OT Cancellation Note  Patient Details Name: ALOHILANI LEVENHAGEN MRN: [id number] DOB: 1941/02/27   Cancelled Treatment:    Reason Eval/Treat Not Completed: Other (comment). Consult received, chart reviewed. Upon attempt, pt with nursing for care. Will re-attempt OT evaluation next date.  Wynona Canes, MPH, MS, OTR/L ascom [phone] 12/20/20, 4:17 PM

## 2020-12-20 NOTE — Progress Notes (Signed)
Patient only taking one or two sips of water, refusing to drink more. NP Jon Billings notified. Ordered bladder scan: urine. Did I&O as ordered. Was only able to get about 5 ml patient resisting and not cooperating, very tense, urinated on bed. Post void residual .  Started on  dextrose 5% in LR @75 .

## 2020-12-20 NOTE — Progress Notes (Signed)
Weaned O2 down to 2L.   Madie Reno, RN

## 2020-12-20 NOTE — Progress Notes (Signed)
Cross Cover Poor oral intake and decreased urine output with glucose readings low end of normal. Patient started on dextrose fluids and cbg monitoring

## 2020-12-21 ENCOUNTER — Inpatient Hospital Stay (HOSPITAL_COMMUNITY)
Admit: 2020-12-21 | Discharge: 2020-12-21 | Disposition: A | Discharge status: Home / Self Care | Payer: Medicare Other | Attending: Internal Medicine | Admitting: Internal Medicine

## 2020-12-21 DIAGNOSIS — R0600 Dyspnea, unspecified: Secondary | ICD-10-CM

## 2020-12-21 LAB — PROCALCITONIN: Procalcitonin: <0.1 ng/mL

## 2020-12-21 LAB — BASIC METABOLIC PANEL
Anion gap: 6 (ref 5–15)
BUN: 8 mg/dL (ref 8–23)
CO2: 26 mmol/L (ref 22–32)
Calcium: 8.8 mg/dL — ABNORMAL LOW (ref 8.9–10.3)
Chloride: 110 mmol/L (ref 98–111)
Creatinine, Ser: 0.48 mg/dL (ref 0.44–1.00)
GFR, Estimated: >60 mL/min (ref >60)
Glucose, Bld: 95 mg/dL (ref 70–99)
Potassium: 4.1 mmol/L (ref 3.5–5.1)
Sodium: 142 mmol/L (ref 135–145)

## 2020-12-21 LAB — ECHOCARDIOGRAM COMPLETE
AR max vel: 1.86 cm2
AV Peak grad: 12 mmHg
Ao pk vel: 1.74 m/s
Area-P 1/2: 3.36 cm2
Height: 67 in
S' Lateral: 2.26 cm
Weight: 2871.27 oz

## 2020-12-21 LAB — GLUCOSE, CAPILLARY
Glucose-Capillary: 105 mg/dL — ABNORMAL HIGH (ref 70–99)
Glucose-Capillary: 105 mg/dL — ABNORMAL HIGH (ref 70–99)
Glucose-Capillary: 117 mg/dL — ABNORMAL HIGH (ref 70–99)
Glucose-Capillary: 133 mg/dL — ABNORMAL HIGH (ref 70–99)
Glucose-Capillary: 142 mg/dL — ABNORMAL HIGH (ref 70–99)
Glucose-Capillary: 96 mg/dL (ref 70–99)

## 2020-12-21 LAB — URINE CULTURE: Culture: NO GROWTH

## 2020-12-21 LAB — CBC WITH DIFFERENTIAL/PLATELET
Abs Immature Granulocytes: 0.03 10*3/uL (ref 0.00–0.07)
Basophils Absolute: 0 10*3/uL (ref 0.0–0.1)
Basophils Relative: 0 %
Eosinophils Absolute: 0.5 10*3/uL (ref 0.0–0.5)
Eosinophils Relative: 7 %
HCT: 33.1 % — ABNORMAL LOW (ref 36.0–46.0)
Hemoglobin: 10.9 g/dL — ABNORMAL LOW (ref 12.0–15.0)
Immature Granulocytes: 0 %
Lymphocytes Relative: 33 %
Lymphs Abs: 2.3 10*3/uL (ref 0.7–4.0)
MCH: 29.9 pg (ref 26.0–34.0)
MCHC: 32.9 g/dL (ref 30.0–36.0)
MCV: 90.9 fL (ref 80.0–100.0)
Monocytes Absolute: 0.7 10*3/uL (ref 0.1–1.0)
Monocytes Relative: 10 %
Neutro Abs: 3.4 10*3/uL (ref 1.7–7.7)
Neutrophils Relative %: 50 %
Platelets: 580 10*3/uL — ABNORMAL HIGH (ref 150–400)
RBC: 3.64 MIL/uL — ABNORMAL LOW (ref 3.87–5.11)
RDW: 13.2 % (ref 11.5–15.5)
WBC: 6.9 10*3/uL (ref 4.0–10.5)
nRBC: 0 % (ref 0.0–0.2)

## 2020-12-21 LAB — MAGNESIUM: Magnesium: 1.9 mg/dL (ref 1.7–2.4)

## 2020-12-21 LAB — PHOSPHORUS: Phosphorus: 2.9 mg/dL (ref 2.5–4.6)

## 2020-12-21 MED ORDER — AZITHROMYCIN 250 MG PO TABS
500.0000 mg | ORAL_TABLET | Freq: Every day | ORAL | Status: DC
  Administered 2020-12-21: 500 mg via ORAL
  Filled 2020-12-21: qty 2

## 2020-12-21 MED ORDER — ZOLPIDEM TARTRATE 5 MG PO TABS: 5.0000 mg | ORAL_TABLET | Freq: Every evening | ORAL | Status: DC | PRN

## 2020-12-21 NOTE — Progress Notes (Signed)
*  PRELIMINARY RESULTS* Echocardiogram 2D Echocardiogram has been performed.  Garrel Ridgel Darald Uzzle 12/21/2020, 9:54 AM

## 2020-12-21 NOTE — Progress Notes (Addendum)
PT recommends SNF. Left VM for Legal Guardian requesting a return call. Left number for weekday TOC if Guardian is unable to respond today.  Alfonso Ramus, [place] [phone]

## 2020-12-21 NOTE — Progress Notes (Signed)
PHARMACIST - PHYSICIAN COMMUNICATION  CONCERNING: Antibiotic IV to Oral Route Change Policy  RECOMMENDATION: This patient is receiving azithromycin by the intravenous route.  Based on criteria approved by the Pharmacy and Therapeutics Committee, the antibiotic(s) is/are being converted to the equivalent oral dose form(s).   DESCRIPTION: These criteria include:  Patient being treated for a respiratory tract infection, urinary tract infection, cellulitis or clostridium difficile associated diarrhea if on metronidazole  The patient is not neutropenic and does not exhibit a GI malabsorption state  The patient is eating (either orally or via tube) and/or has been taking other orally administered medications for a least 24 hours  The patient is improving clinically and has a Tmax < 100.5  If you have questions about this conversion, please contact the Pharmacy Department   Tressie Ellis  12/21/20

## 2020-12-21 NOTE — Progress Notes (Signed)
PROGRESS NOTE    [name]  SNK:[id number] DOB: [birth year] DOA: 12/19/2020 PCP: Patient, No Pcp Per (Inactive)    Brief Narrative:  80 year old female from assisted living facility with history of bipolar disorder, dementia, insomnia, peripheral arterial disease, GERD and chronic pain syndrome who was recently admitted to the hospital brought back to the emergency room with shortness of breath found at her assisted living place.  Reportedly patient was on amoxicillin for UTI.  She was recently admitted to the hospital 4/1-4/5 for similar symptoms and treated for pneumonia.  She was discharged on 2 L oxygen.  Patient poor historian.  In the emergency room, on 2 L oxygen.  Potassium 3.3.  Otherwise electrolytes normal.  Chest x-ray with a small left sided effusion and left basilar opacity more prominent than a week ago.  Increasing right patchy opacities.  Was given vancomycin cefepime in the ER due to recent hospitalization and admitted.   Assessment & Plan:   Principal Problem:   Acute respiratory failure with hypoxia (HCC) Active Problems:   Bipolar disorder (HCC)   Dementia with behavioral disturbance (HCC)   HCAP (healthcare-associated pneumonia)   PAD (peripheral artery disease) (HCC)   HLD (hyperlipidemia)   Insomnia   GERD (gastroesophageal reflux disease)  Pneumonia, present on admission.  Suspect both gram-positive and gram-negative.  Acute hypoxemic respiratory failure, acute on chronic and probably new baseline. Recently completed treatment.  Unsure whether she has recurrent pneumonia or all evolving changes on her x-ray.  Continue Rocephin and azithromycin until further data available.  Procalcitonin was normal. Seen by speech on 4/2, no evidence of aspiration. 2D echocardiogram pending. We will try incentive spirometry, mobility, breathing exercises and bronchodilator therapies.  Work with PT OT.  Looks like she is mostly wheelchair-bound.  May go back to assisted living  facility with PT OT.  Hypokalemia: Replaced and improved.    Severe hypomagnesemia: Replaced and adequate.  Hypoglycemia: Due to poor oral intake.  On maintenance dextrose today.  Continue.  Dementia/bipolar disorder and insomnia: On multiple medications including lamotrigine, olanzapine, trazodone, mirtazapine, Depakote, rivastigmine and duloxetine that we will continue.  Hypoglycemia/recurrent hospitalization/severe electrolyte abnormalities due to poor oral intake and adult failure to thrive: Looks like patient has recently worsening clinical condition.  Will consult palliative care to continue goal of care discussion and to explore if patient is eligible for hospice level of care.  Patient continues to be on dextrose drip to keep up her blood sugars.  She is not eating. Not stable for discharge.   DVT prophylaxis: enoxaparin (LOVENOX) injection 40 mg Start: 12/19/20 2200   Code Status: DNR Family Communication: None at the bedside Disposition Plan: Status is: Inpatient.  Dispo: The patient is from: ALF              Anticipated d/c is to: ALF              Patient currently is not medically stable to d/c.   Difficult to place patient No     Consultants:   Palliative  Procedures:   None  Antimicrobials:  Anti-infectives (From admission, onward)   Start     Dose/Rate Route Frequency Ordered Stop   12/21/20 2100  azithromycin (ZITHROMAX) tablet 500 mg        500 mg Oral Daily 12/21/20 0812 12/24/20 2059   12/20/20 1900  cefTRIAXone (ROCEPHIN) 2 g in sodium chloride 0.9 % 100 mL IVPB        2 g 200 mL/hr over  30 Minutes Intravenous Every 24 hours 12/19/20 2058 12/25/20 1859   12/19/20 2100  azithromycin (ZITHROMAX) 500 mg in sodium chloride 0.9 % 250 mL IVPB  Status:  Discontinued        500 mg 250 mL/hr over 60 Minutes Intravenous Every 24 hours 12/19/20 2058 12/21/20 0812   12/19/20 1915  vancomycin (VANCOCIN) IVPB 1000 mg/200 mL premix        1,000 mg 200 mL/hr  over 60 Minutes Intravenous  Once 12/19/20 1906 12/19/20 2100   12/19/20 1915  ceFEPIme (MAXIPIME) 2 g in sodium chloride 0.9 % 100 mL IVPB        2 g 200 mL/hr over 30 Minutes Intravenous  Once 12/19/20 1906 12/19/20 2021         Subjective: Patient seen and examined.  She was awake and alert.  Patient would say that she has no appetite.  She just does not feel well.  Remains afebrile.  Not in any obvious distress or short of breath but she feels like she is short of breath.  Objective: Vitals:   12/21/20 0404 12/21/20 0410 12/21/20 0428 12/21/20 0929  BP: (!) 113/53   (!) 89/59  Pulse: 73   87  Resp: 16   14  Temp: 97.9 F (36.6 C)   99.1 F (37.3 C)  TempSrc:    Oral  SpO2: 90% 95%  93%  Weight:   81.4 kg   Height:        Intake/Output Summary (Last 24 hours) at 12/21/2020 1030 Last data filed at 12/21/2020 0600 Gross per 24 hour  Intake 613.76 ml  Output 700 ml  Net -86.24 ml   Filed Weights   12/19/20 1709 12/19/20 2229 12/21/20 0428  Weight: 49.4 kg 55.3 kg 81.4 kg    Examination:  General exam: Chronically sick looking debilitated and frail female complains of shortness of breath.  Looks fairly comfortable at rest. Respiratory system: Mostly conducted airway sounds.SpO2: 93 % O2 Flow Rate (L/min): 3 L/min  Cardiovascular system: S1 & S2 heard, RRR. No JVD, murmurs, rubs, gallops or clicks. No pedal edema. Gastrointestinal system: Abdomen is nondistended, soft and nontender. No organomegaly or masses felt. Normal bowel sounds heard. Central nervous system: Alert and oriented x2-3.  Generalized weakness. Extremities: Symmetric 5 x 5 power. Skin: No rashes, lesions or ulcers Psychiatry: Judgement and insight appear normal. Mood is anxious and affect is flat.    Data Reviewed: I have personally reviewed following labs and imaging studies  CBC: Recent Labs  Lab 12/15/20 0621 12/16/20 0425 12/19/20 1711 12/20/20 0532 12/21/20 0414  WBC 8.3 10.1 11.2* 7.9  6.9  NEUTROABS  --   --  7.7  --  3.4  HGB 10.4* 9.7* 11.3* 9.5* 10.9*  HCT 33.5* 30.8* 35.4* 29.7* 33.1*  MCV 93.3 91.1 90.1 91.1 90.9  PLT 531* 547* 667* 531* 580*   Basic Metabolic Panel: Recent Labs  Lab 12/15/20 0621 12/16/20 0425 12/19/20 1711 12/20/20 0532 12/21/20 0414  NA 140 141 142 140 142  K 4.0 3.8 3.3* 3.8 4.1  CL 111 108 106 110 110  CO2 25 25 26 23 26   GLUCOSE 80 89 90 74 95  BUN 15 12 18 13 8   CREATININE 0.56 0.46 0.58 0.63 0.48  CALCIUM 8.5* 8.6* 9.2 8.5* 8.8*  MG  --  1.2* 1.1*  --  1.9  PHOS  --  2.5  --   --  2.9   GFR: Estimated Creatinine Clearance: 61.5 mL/min (  by C-G formula based on SCr of 0.48 mg/dL). Liver Function Tests: Recent Labs  Lab 12/20/20 0532  AST 18  ALT 15  ALKPHOS 49  BILITOT 0.7  PROT 4.9*  ALBUMIN 2.3*   No results for input(s): LIPASE, AMYLASE in the last 168 hours. Recent Labs  Lab 12/15/20 0621  AMMONIA 22   Coagulation Profile: No results for input(s): INR, PROTIME in the last 168 hours. Cardiac Enzymes: No results for input(s): CKTOTAL, CKMB, CKMBINDEX, TROPONINI in the last 168 hours. BNP (last 3 results) No results for input(s): PROBNP in the last 8760 hours. HbA1C: No results for input(s): HGBA1C in the last 72 hours. CBG: Recent Labs  Lab 12/20/20 2037 12/20/20 2119 12/21/20 0008 12/21/20 0409 12/21/20 0730  GLUCAP 86 86 96 105* 133*   Lipid Profile: No results for input(s): CHOL, HDL, LDLCALC, TRIG, CHOLHDL, LDLDIRECT in the last 72 hours. Thyroid Function Tests: No results for input(s): TSH, T4TOTAL, FREET4, T3FREE, THYROIDAB in the last 72 hours. Anemia Panel: No results for input(s): VITAMINB12, FOLATE, FERRITIN, TIBC, IRON, RETICCTPCT in the last 72 hours. Sepsis Labs: Recent Labs  Lab 12/19/20 1711 12/20/20 0532 12/21/20 0414  PROCALCITON <0.10 <0.10 <0.10    Recent Results (from the past 240 hour(s))  Resp Panel by RT-PCR (Flu A&B, Covid) Nasopharyngeal Swab     Status: None    Collection Time: 12/12/20  5:27 PM   Specimen: Nasopharyngeal Swab; Nasopharyngeal(NP) swabs in vial transport medium  Result Value Ref Range Status   SARS Coronavirus 2 by RT PCR NEGATIVE NEGATIVE Final    Comment: (NOTE) SARS-CoV-2 target nucleic acids are NOT DETECTED.  The SARS-CoV-2 RNA is generally detectable in upper respiratory specimens during the acute phase of infection. The lowest concentration of SARS-CoV-2 viral copies this assay can detect is 138 copies/mL. A negative result does not preclude SARS-Cov-2 infection and should not be used as the sole basis for treatment or other patient management decisions. A negative result may occur with  improper specimen collection/handling, submission of specimen other than nasopharyngeal swab, presence of viral mutation(s) within the areas targeted by this assay, and inadequate number of viral copies(<138 copies/mL). A negative result must be combined with clinical observations, patient history, and epidemiological information. The expected result is Negative.  Fact Sheet for Patients:  BloggerCourse.com  Fact Sheet for Healthcare Providers:  SeriousBroker.it  This test is no t yet approved or cleared by the [place] FDA and  has been authorized for detection and/or diagnosis of SARS-CoV-2 by FDA under an Emergency Use Authorization (EUA). This EUA will remain  in effect (meaning this test can be used) for the duration of the COVID-19 declaration under Section 564(b)(1) of the Act, 21 U.S.C.section 360bbb-3(b)(1), unless the authorization is terminated  or revoked sooner.       Influenza A by PCR NEGATIVE NEGATIVE Final   Influenza B by PCR NEGATIVE NEGATIVE Final    Comment: (NOTE) The Xpert Xpress SARS-CoV-2/FLU/RSV plus assay is intended as an aid in the diagnosis of influenza from Nasopharyngeal swab specimens and should not be used as a sole basis for treatment.  Nasal washings and aspirates are unacceptable for Xpert Xpress SARS-CoV-2/FLU/RSV testing.  Fact Sheet for Patients: BloggerCourse.com  Fact Sheet for Healthcare Providers: SeriousBroker.it  This test is not yet approved or cleared by the [place] FDA and has been authorized for detection and/or diagnosis of SARS-CoV-2 by FDA under an Emergency Use Authorization (EUA). This EUA will remain in effect (meaning this test  can be used) for the duration of the COVID-19 declaration under Section 564(b)(1) of the Act, 21 U.S.C. section 360bbb-3(b)(1), unless the authorization is terminated or revoked.  Performed at [facility], [address] [place], [place] [address]   Blood culture (routine x 2)     Status: None   Collection Time: 12/12/20  5:27 PM   Specimen: BLOOD  Result Value Ref Range Status   Specimen Description BLOOD LEFT ANTECUBITAL  Final   Special Requests   Final    BOTTLES DRAWN AEROBIC AND ANAEROBIC Blood Culture results may not be optimal due to an inadequate volume of blood received in culture bottles   Culture   Final    NO GROWTH 5 DAYS Performed at [facility], [address] [place], [place] [address]    Report Status 12/17/2020 FINAL  Final  Blood culture (routine x 2)     Status: None   Collection Time: 12/12/20  5:27 PM   Specimen: BLOOD  Result Value Ref Range Status   Specimen Description BLOOD BLOOD RIGHT WRIST  Final   Special Requests   Final    BOTTLES DRAWN AEROBIC AND ANAEROBIC Blood Culture results may not be optimal due to an inadequate volume of blood received in culture bottles   Culture   Final    NO GROWTH 5 DAYS Performed at [facility], [address] [place], [place] [address]    Report Status 12/17/2020 FINAL  Final  MRSA PCR Screening     Status: None   Collection Time: 12/15/20  6:00 AM   Specimen: Nasopharyngeal  Result Value Ref Range Status    MRSA by PCR NEGATIVE NEGATIVE Final    Comment:        The GeneXpert MRSA Assay (FDA approved for NASAL specimens only), is one component of a comprehensive MRSA colonization surveillance program. It is not intended to diagnose MRSA infection nor to guide or monitor treatment for MRSA infections. Performed at [facility], [address] [place], [place] [address]   Resp Panel by RT-PCR (Flu A&B, Covid) Nasopharyngeal Swab     Status: None   Collection Time: 12/19/20  5:50 PM   Specimen: Nasopharyngeal Swab; Nasopharyngeal(NP) swabs in vial transport medium  Result Value Ref Range Status   SARS Coronavirus 2 by RT PCR NEGATIVE NEGATIVE Final    Comment: (NOTE) SARS-CoV-2 target nucleic acids are NOT DETECTED.  The SARS-CoV-2 RNA is generally detectable in upper respiratory specimens during the acute phase of infection. The lowest concentration of SARS-CoV-2 viral copies this assay can detect is 138 copies/mL. A negative result does not preclude SARS-Cov-2 infection and should not be used as the sole basis for treatment or other patient management decisions. A negative result may occur with  improper specimen collection/handling, submission of specimen other than nasopharyngeal swab, presence of viral mutation(s) within the areas targeted by this assay, and inadequate number of viral copies(<138 copies/mL). A negative result must be combined with clinical observations, patient history, and epidemiological information. The expected result is Negative.  Fact Sheet for Patients:  BloggerCourse.com  Fact Sheet for Healthcare Providers:  SeriousBroker.it  This test is no t yet approved or cleared by the [place] FDA and  has been authorized for detection and/or diagnosis of SARS-CoV-2 by FDA under an Emergency Use Authorization (EUA). This EUA will remain  in effect (meaning this test can be used) for the  duration of the COVID-19 declaration under Section 564(b)(1) of the  Act, 21 U.S.C.section 360bbb-3(b)(1), unless the authorization is terminated  or revoked sooner.       Influenza A by PCR NEGATIVE NEGATIVE Final   Influenza B by PCR NEGATIVE NEGATIVE Final    Comment: (NOTE) The Xpert Xpress SARS-CoV-2/FLU/RSV plus assay is intended as an aid in the diagnosis of influenza from Nasopharyngeal swab specimens and should not be used as a sole basis for treatment. Nasal washings and aspirates are unacceptable for Xpert Xpress SARS-CoV-2/FLU/RSV testing.  Fact Sheet for Patients: BloggerCourse.com  Fact Sheet for Healthcare Providers: SeriousBroker.it  This test is not yet approved or cleared by the [place] FDA and has been authorized for detection and/or diagnosis of SARS-CoV-2 by FDA under an Emergency Use Authorization (EUA). This EUA will remain in effect (meaning this test can be used) for the duration of the COVID-19 declaration under Section 564(b)(1) of the Act, 21 U.S.C. section 360bbb-3(b)(1), unless the authorization is terminated or revoked.  Performed at [facility], [address] [place], [place] [address]   Blood culture (routine x 2)     Status: None (Preliminary result)   Collection Time: 12/19/20  7:20 PM   Specimen: BLOOD  Result Value Ref Range Status   Specimen Description BLOOD  RIGHT FOREARM   Final   Special Requests   Final    BOTTLES DRAWN AEROBIC AND ANAEROBIC Blood Culture adequate volume   Culture   Final    NO GROWTH 2 DAYS Performed at [facility], [address] [place], [place] [address]    Report Status PENDING  Incomplete  Blood culture (routine x 2)     Status: None (Preliminary result)   Collection Time: 12/19/20  7:30 PM   Specimen: BLOOD  Result Value Ref Range Status   Specimen Description BLOOD  LEFT FOREARM  Final   Special Requests   Final     BOTTLES DRAWN AEROBIC AND ANAEROBIC Blood Culture results may not be optimal due to an inadequate volume of blood received in culture bottles   Culture   Final    NO GROWTH 2 DAYS Performed at [facility], [address], [place], [place] [address]    Report Status PENDING  Incomplete  MRSA PCR Screening     Status: None   Collection Time: 12/20/20  6:09 AM   Specimen: Nasal Mucosa; Nasopharyngeal  Result Value Ref Range Status   MRSA by PCR NEGATIVE NEGATIVE Final    Comment:        The GeneXpert MRSA Assay (FDA approved for NASAL specimens only), is one component of a comprehensive MRSA colonization surveillance program. It is not intended to diagnose MRSA infection nor to guide or monitor treatment for MRSA infections. Performed at [facility], [address] [place], [place] [address]          Radiology Studies: DG Chest Portable 1 View  Result Date: 12/19/2020 CLINICAL DATA:  Shortness of breath.  Difficulty breathing. EXAM: PORTABLE CHEST 1 VIEW COMPARISON:  Radiograph and CT 12/12/2020. FINDINGS: Unchanged heart size and mediastinal contours. Aortic atherosclerosis. Small left pleural effusion and basilar opacity, slightly progressed from imaging 1 week ago. Increasing patchy right basilar opacity. Increasing peribronchial thickening. No pneumothorax. No acute osseous abnormalities are seen. IMPRESSION: Small left pleural effusion and basilar opacity, slightly progressed from imaging 1 week ago. Increasing patchy right basilar opacity may be atelectasis or pneumonia. Given distribution, consider aspiration. Electronically Signed   By: Narda Rutherford M.D.   On: 12/19/2020  18:19        Scheduled Meds: . aspirin  81 mg Oral Daily  . azithromycin  500 mg Oral Daily  . divalproex  125 mg Oral BID  . DULoxetine  60 mg Oral Daily  . enoxaparin (LOVENOX) injection  40 mg Subcutaneous Q24H  . feeding supplement  237 mL Oral BID BM  . lactulose  30 g  Oral Daily  . lamoTRIgine  25 mg Oral Daily  . mouth rinse  15 mL Mouth Rinse BID  . mirtazapine  30 mg Oral Daily  . multivitamin with minerals  1 tablet Oral Daily  . OLANZapine  5 mg Oral QHS  . pantoprazole  40 mg Oral Daily  . rivastigmine  6 mg Oral BID  . rosuvastatin  10 mg Oral Daily  . senna-docusate  2 tablet Oral BID  . sodium chloride flush  3 mL Intravenous Q12H  . traZODone  50 mg Oral QHS   Continuous Infusions: . sodium chloride Stopped (12/20/20 1342)  . cefTRIAXone (ROCEPHIN)  IV 2 g (12/20/20 1913)  . dextrose 5% lactated ringers 75 mL/hr at 12/21/20 0928     LOS: 1 day    Time spent: 32 minutes    Amber CarrowKuber Glayds [place], MD [facility] Pager [phone]

## 2020-12-21 NOTE — Evaluation (Signed)
Physical Therapy Evaluation Patient Details Name: [name] MRN: [id number] DOB: Apr 26, 1941 Today's Date: 12/21/2020   History of Present Illness  80 year old female from assisted living facility with history of bipolar disorder, dementia, insomnia, peripheral arterial disease, GERD and chronic pain syndrome who was recently admitted to the hospital brought back to the emergency room with shortness of breath found at her assisted living place. Found to have acute respiratory failure with hypoxia, pneumonia.     Clinical Impression  Patient agreeable to PT with encouragement. Patient reports she uses wheelchair for primary means of mobility at ALF. Patient needs Mod A for bed mobility and Max A for incremental scooting along edge of bed during evaluation. Patient was fatigued with activity, however no significant shortness of breath noted. Sp02 90-92% with heart rate in the 90's with activity. Patient educated on positioning and breathing techniques and demonstrated pursed lip breathing with cueing. Patient currently needs assistance with mobility and would benefit from ongoing PT to maximize independence and facilitate return to prior level of function. SNF recommended at this time.     Follow Up Recommendations SNF    Equipment Recommendations  None recommended by PT    Recommendations for Other Services       Precautions / Restrictions Precautions Precautions: Fall Restrictions Weight Bearing Restrictions: No      Mobility  Bed Mobility Overal bed mobility: Needs Assistance Bed Mobility: Supine to Sit;Sit to Supine     Supine to sit: Mod assist Sit to supine: Mod assist   General bed mobility comments: assistance for trunk and BLE support. verbal cues for sequencing and technique    Transfers Overall transfer level: Needs assistance   Transfers: Lateral/Scoot Transfers          Lateral/Scoot Transfers: Max assist General transfer comment: Max A for incremental  scoot transfer along edge of bed. verbal cues for technique.decreased weight acceptance on BLE with transfer efforts  Ambulation/Gait             General Gait Details: not attempted as patient reports she is non ambulatory baseline  Stairs            Wheelchair Mobility    Modified Rankin (Stroke Patients Only)       Balance Overall balance assessment: Needs assistance Sitting-balance support: Feet supported;Bilateral upper extremity supported Sitting balance-Leahy Scale: Good                                       Pertinent Vitals/Pain Pain Assessment: No/denies pain    Home Living Family/patient expects to be discharged to:: Assisted living               Home Equipment: Wheelchair - manual;Shower seat;Grab bars - toilet;Grab bars - tub/shower      Prior Function Level of Independence: Needs assistance   Gait / Transfers Assistance Needed: patient reports she is non ambulatory at baseline. patient reports she pivots to and from her wheelchair without assistance. patient propels wheelchair with UE and LE support but no phyiscal assistance required at baseline  ADL's / Homemaking Assistance Needed: patient reports she has assistance for ADLs at her ALF  Comments: patient has medication management and meals prepared at ALF per report     Hand Dominance        Extremity/Trunk Assessment   Upper Extremity Assessment Upper Extremity Assessment: Generalized weakness    Lower Extremity Assessment  Lower Extremity Assessment: Generalized weakness       Communication      Cognition Arousal/Alertness: Awake/alert Behavior During Therapy: Flat affect Overall Cognitive Status: History of cognitive impairments - at baseline                                 General Comments: patient is oriented to person only. patient is able to follow single step commands consistently and perseverated on feeling short of breath       General Comments      Exercises     Assessment/Plan    PT Assessment Patient needs continued PT services  PT Problem List Decreased strength;Decreased activity tolerance;Decreased balance;Decreased mobility;Decreased cognition;Decreased safety awareness       PT Treatment Interventions Gait training;DME instruction;Functional mobility training;Therapeutic activities;Therapeutic exercise;Neuromuscular re-education;Balance training    PT Goals (Current goals can be found in the Care Plan section)  Acute Rehab PT Goals Patient Stated Goal: to have better breathing PT Goal Formulation: With patient Time For Goal Achievement: 01/04/21 Potential to Achieve Goals: Good    Frequency Min 2X/week   Barriers to discharge        Co-evaluation               AM-PAC PT "6 Clicks" Mobility  Outcome Measure Help needed turning from your back to your side while in a flat bed without using bedrails?: A Lot Help needed moving from lying on your back to sitting on the side of a flat bed without using bedrails?: A Lot Help needed moving to and from a bed to a chair (including a wheelchair)?: A Lot Help needed standing up from a chair using your arms (e.g., wheelchair or bedside chair)?: A Lot Help needed to walk in hospital room?: Total Help needed climbing 3-5 steps with a railing? : Total 6 Click Score: 10    End of Session Equipment Utilized During Treatment: Oxygen Activity Tolerance: Patient limited by fatigue Patient left: in bed;with call bell/phone within reach;with bed alarm set Nurse Communication: Mobility status PT Visit Diagnosis: Unsteadiness on feet (R26.81);Muscle weakness (generalized) (M62.81)    Time: 0950-1010 PT Time Calculation (min) (ACUTE ONLY): 20 min   Charges:   PT Evaluation $PT Eval Moderate Complexity: 1 Mod PT Treatments $Therapeutic Activity: 8-22 mins        Donna Bernard, PT, MPT  Ina Homes 12/21/2020, 1:35 PM

## 2020-12-21 NOTE — Progress Notes (Signed)
Patient just discharged last week. Patient with high Readmission Risk Score.   Information from Readmission Screen completed on 12/15/20:  "Patient admitted to the hospital with pneumonia on acute O2 at 2L.  Patient is from [facility].  [facility] is under [facility] guardianship since 2008.  Plan is for discharge back to [facility], Legal guardian has been updated.  RNCM has also reached out to [facility] to let them know patient may be returning soon and may also need oxygen at the facility.   Per legal guardian and [facility] patient is mostly wheelchair bound at baseline.  PT is recommending home health.  Barbara Cower with [facility] will accept referral for PT and OT. "    Alfonso Ramus, LCSW [phone]

## 2020-12-21 NOTE — Evaluation (Signed)
Occupational Therapy Evaluation Patient Details Name: [name] MRN: [id number] DOB: Dec 28, 1940 Today's Date: 12/21/2020    History of Present Illness 80 year old female from assisted living facility with history of bipolar disorder, dementia, insomnia, peripheral arterial disease, GERD and chronic pain syndrome who was recently admitted to the hospital brought back to the emergency room with shortness of breath found at her assisted living place. Found to have acute respiratory failure with hypoxia, pneumonia,   Clinical Impression   Pt seen for OT evaluation on this date. Upon arrival to room, pt awake and seated upright in bed on 3L of O2 via [place]. Pt A&Ox2. Pt reported no pain, however reported that she felt short of breath. When informed SpO2 95%, pt agreeable to OT evaluation. Pt able to follow single step commands consistently, however perseverated on feeling short of breath (SpO2 >92% throughout session). Pt currently presents with decreased strength, activity tolerance, and awareness of deficits, and requires SUPERVISION for rolling, SET-UP ASSISTANCE for bed-level grooming tasks, and MIN A for supine<>sit transfers. Pt had good sitting balance at EOB during UE assessment, however shortly after assessment, reported dizziness and initiated return to supine. Pt would benefit from additional skilled OT services to maximize return to PLOF and prevent further deconditioning. Upon discharge, recommend SNF.     Follow Up Recommendations  SNF    Equipment Recommendations  Other (comment) (defer to next venue of care)       Precautions / Restrictions Precautions Precautions: Fall Restrictions Weight Bearing Restrictions: No      Mobility Bed Mobility Overal bed mobility: Needs Assistance Bed Mobility: Supine to Sit;Sit to Supine;Rolling Rolling: Supervision   Supine to sit: Min assist Sit to supine: Min assist   General bed mobility comments: Physical assistance for scooting hips  toward EOB    Transfers Overall transfer level: Needs assistance   assist General transfer comment: Pt reporting dizziness and initiating return to supine    Balance Overall balance assessment: Needs assistance Sitting-balance support: Feet supported;No upper extremity supported Sitting balance-Leahy Scale: Good Sitting balance - Comments: Good sitting balance at EOB during UE assessment                                   ADL either performed or assessed with clinical judgement   ADL Overall ADL's : Needs assistance/impaired     Grooming: Wash/dry face;Set up;Cueing for sequencing;Bed level                                                    Pertinent Vitals/Pain Pain Assessment: No/denies pain        Extremity/Trunk Assessment Upper Extremity Assessment Upper Extremity Assessment: Generalized weakness   Lower Extremity Assessment Lower Extremity Assessment: Generalized weakness       Communication Communication Communication: No difficulties   Cognition Arousal/Alertness: Awake/alert Behavior During Therapy: Flat affect Overall Cognitive Status: History of cognitive impairments - at baseline                                 General Comments: Pt oriented to person and place. Pt able to follow single step commands consistently, however perseverated on feeling short of breath  Home Living Family/patient expects to be discharged to:: Assisted living                             Home Equipment: Wheelchair - manual;Shower seat;Grab bars - toilet;Grab bars - tub/shower          Prior Functioning/Environment Level of Independence: Needs assistance  Gait / Transfers Assistance Needed: Patient reports using a WC at baseline for functional mobility. Pt reports she was able to pivots to/from [facility] without assistance. ADL's / Homemaking Assistance Needed: Pt reports that she is independent for  dressing, but requires assistance for bathing and IADL management   Comments: patient has medication management and meals prepared at ALF per report        OT Problem List: Decreased strength;Decreased activity tolerance;Decreased cognition;Cardiopulmonary status limiting activity      OT Treatment/Interventions: Self-care/ADL training;Therapeutic exercise;Energy conservation;DME and/or AE instruction;Therapeutic activities;Visual/perceptual remediation/compensation;Patient/family education;Balance training    OT Goals(Current goals can be found in the care plan section) Acute Rehab OT Goals Patient Stated Goal: to improve breathing OT Goal Formulation: With patient Time For Goal Achievement: 01/04/21 Potential to Achieve Goals: Good ADL Goals Pt Will Perform Grooming: with modified independence;sitting Pt Will Perform Upper Body Dressing: with modified independence;sitting Pt Will Perform Lower Body Dressing: with modified independence;sitting/lateral leans  OT Frequency: Min 1X/week    AM-PAC OT "6 Clicks" Daily Activity     Outcome Measure Help from another person eating meals?: None Help from another person taking care of personal grooming?: A Little Help from another person toileting, which includes using toliet, bedpan, or urinal?: A Lot Help from another person bathing (including washing, rinsing, drying)?: A Lot Help from another person to put on and taking off regular upper body clothing?: A Little Help from another person to put on and taking off regular lower body clothing?: A Lot 6 Click Score: 16   End of Session Equipment Utilized During Treatment: Oxygen Nurse Communication: Mobility status  Activity Tolerance: Patient tolerated treatment well Patient left: in bed;with call bell/phone within reach;with chair alarm set  OT Visit Diagnosis: Muscle weakness (generalized) (M62.81)                Time: 0240-9735 OT Time Calculation (min): 16 min Charges:  OT  General Charges $OT Visit: 1 Visit OT Evaluation $OT Eval Moderate Complexity: 1 Mod  Matthew Folks, OTR/L [facility] [phone]

## 2020-12-22 LAB — CBC WITH DIFFERENTIAL/PLATELET
Abs Immature Granulocytes: 0.03 10*3/uL (ref 0.00–0.07)
Basophils Absolute: 0 10*3/uL (ref 0.0–0.1)
Basophils Relative: 0 %
Eosinophils Absolute: 0.4 10*3/uL (ref 0.0–0.5)
Eosinophils Relative: 5 %
HCT: 33.3 % — ABNORMAL LOW (ref 36.0–46.0)
Hemoglobin: 10.6 g/dL — ABNORMAL LOW (ref 12.0–15.0)
Immature Granulocytes: 0 %
Lymphocytes Relative: 33 %
Lymphs Abs: 2.6 10*3/uL (ref 0.7–4.0)
MCH: 29.3 pg (ref 26.0–34.0)
MCHC: 31.8 g/dL (ref 30.0–36.0)
MCV: 92 fL (ref 80.0–100.0)
Monocytes Absolute: 0.8 10*3/uL (ref 0.1–1.0)
Monocytes Relative: 10 %
Neutro Abs: 4.2 10*3/uL (ref 1.7–7.7)
Neutrophils Relative %: 52 %
Platelets: 571 10*3/uL — ABNORMAL HIGH (ref 150–400)
RBC: 3.62 MIL/uL — ABNORMAL LOW (ref 3.87–5.11)
RDW: 13.2 % (ref 11.5–15.5)
WBC: 7.9 10*3/uL (ref 4.0–10.5)
nRBC: 0 % (ref 0.0–0.2)

## 2020-12-22 LAB — GLUCOSE, CAPILLARY
Glucose-Capillary: 111 mg/dL — ABNORMAL HIGH (ref 70–99)
Glucose-Capillary: 97 mg/dL (ref 70–99)

## 2020-12-22 LAB — COMPREHENSIVE METABOLIC PANEL
ALT: 13 U/L (ref 0–44)
AST: 15 U/L (ref 15–41)
Albumin: 2.4 g/dL — ABNORMAL LOW (ref 3.5–5.0)
Alkaline Phosphatase: 54 U/L (ref 38–126)
Anion gap: 7 (ref 5–15)
BUN: 13 mg/dL (ref 8–23)
CO2: 27 mmol/L (ref 22–32)
Calcium: 8.8 mg/dL — ABNORMAL LOW (ref 8.9–10.3)
Chloride: 108 mmol/L (ref 98–111)
Creatinine, Ser: 0.46 mg/dL (ref 0.44–1.00)
GFR, Estimated: >60 mL/min (ref >60)
Glucose, Bld: 99 mg/dL (ref 70–99)
Potassium: 4.2 mmol/L (ref 3.5–5.1)
Sodium: 142 mmol/L (ref 135–145)
Total Bilirubin: 0.2 mg/dL — ABNORMAL LOW (ref 0.3–1.2)
Total Protein: 5.2 g/dL — ABNORMAL LOW (ref 6.5–8.1)

## 2020-12-22 LAB — PHOSPHORUS: Phosphorus: 2.9 mg/dL (ref 2.5–4.6)

## 2020-12-22 LAB — MAGNESIUM: Magnesium: 1.5 mg/dL — ABNORMAL LOW (ref 1.7–2.4)

## 2020-12-22 MED ORDER — MAGNESIUM SULFATE 2 GM/50ML IV SOLN
2.0000 g | Freq: Once | INTRAVENOUS | Status: AC
  Administered 2020-12-22: 2 g via INTRAVENOUS
  Filled 2020-12-22: qty 50

## 2020-12-22 MED ORDER — MAGNESIUM OXIDE 400 (241.3 MG) MG PO TABS
400.0000 mg | ORAL_TABLET | Freq: Two times a day (BID) | ORAL | Status: DC
  Administered 2020-12-22 – 2020-12-25 (×7): 400 mg via ORAL
  Filled 2020-12-22 (×7): qty 1

## 2020-12-22 NOTE — Plan of Care (Signed)
  Problem: Nutrition: Goal: Adequate nutrition will be maintained Outcome: Progressing   

## 2020-12-22 NOTE — TOC Progression Note (Signed)
Transition of Care [facility]) - Progression Note    Patient Details  Name: [name] MRN: [id number] Date of Birth: 1940/12/24  [facility] CM/SW Contact  Chapman Fitch, RN Phone Number: 12/22/2020, 9:18 AM  Clinical Narrative:    Voicemail left on both voicemails for DSS.Case worker is :Pietro Cassis Corporate treasurer) Office: [phone]. Cell: [phone] to discuss SNF recommendations        Expected Discharge Plan and Services                                                 Social Determinants of Health (SDOH) Interventions    Readmission Risk Interventions No flowsheet data found.

## 2020-12-22 NOTE — Progress Notes (Signed)
PROGRESS NOTE    [name]  QMV:[id number] DOB: [birth year] DOA: 12/19/2020 PCP: Patient, No Pcp Per (Inactive)    Brief Narrative:  80 year old female from assisted living facility with history of bipolar disorder, dementia, insomnia, peripheral arterial disease, GERD and chronic pain syndrome who was recently admitted to the hospital brought back to the emergency room with shortness of breath found at her assisted living place.  Reportedly patient was on amoxicillin for UTI.  She was recently admitted to the hospital 4/1-4/5 for similar symptoms and treated for pneumonia.  She was discharged on 2 L oxygen.  Patient poor historian.  In the emergency room, on 2 L oxygen.  Potassium 3.3.  Otherwise electrolytes normal.  Chest x-ray with a small left sided effusion and left basilar opacity more prominent than a week ago.  Increasing right patchy opacities.  Was given vancomycin cefepime in the ER due to recent hospitalization and admitted. Patient remains in the hospital with not eating, hypoglycemic without IV dextrose. She has a Field seismologist guardian, Child psychotherapist.   Assessment & Plan:   Principal Problem:   Acute respiratory failure with hypoxia (HCC) Active Problems:   Bipolar disorder (HCC)   Dementia with behavioral disturbance (HCC)   HCAP (healthcare-associated pneumonia)   PAD (peripheral artery disease) (HCC)   HLD (hyperlipidemia)   Insomnia   GERD (gastroesophageal reflux disease)  Pneumonia, present on admission.  Suspected.  Ruled out.  Less likely.  Recently completely treated.  Will discontinue all antibiotics. Procalcitonin was normal. Seen by speech on 4/2, no evidence of aspiration. 2D echocardiogram with normal ejection fraction. We will try incentive spirometry, mobility, breathing exercises and bronchodilator therapies.  Work with PT OT.  Looks like she is mostly wheelchair-bound.  May go back to assisted living facility with PT OT. Keep on oxygen to keep  saturations more than 90%.  Hypokalemia: Replaced and improved.    Severe hypomagnesemia: Replaced.  Is still low.  Replace further today.  Hypoglycemia: Due to poor oral intake.  On maintenance dextrose today.  Continue.  Dementia/bipolar disorder and insomnia: On multiple medications including lamotrigine, olanzapine, trazodone, mirtazapine, Depakote, rivastigmine and duloxetine that we will continue.  Hypoglycemia/recurrent hospitalization/severe electrolyte abnormalities due to poor oral intake and adult failure to thrive: Looks like patient has recently worsening clinical condition.  Will consult palliative care to continue goal of care discussion and to explore if patient is eligible for hospice level of care.  Patient continues to be on dextrose drip to keep up her blood sugars.  She is not eating. Not stable for discharge.  Patient continues to not eating enough.  She does not have an appetite.  Her blood sugars are maintained on dextrose infusion.  With her advanced age and frailty, she likely candidate for hospice.  Will be seen by palliative today. Waiting for callback from her guardian, case management trying to connect.   DVT prophylaxis: enoxaparin (LOVENOX) injection 40 mg Start: 12/19/20 2200   Code Status: DNR Family Communication: No family.  Case management is waiting callback from legal guardian. Disposition Plan: Status is: Inpatient.  Dispo: The patient is from: ALF              Anticipated d/c is to: Unknown.  Skilled nursing facility vs hospice.              Patient currently is not medically stable to d/c.   Difficult to place patient No     Consultants:   Palliative  Procedures:  None  Antimicrobials:  Anti-infectives (From admission, onward)   Start     Dose/Rate Route Frequency Ordered Stop   12/21/20 2100  azithromycin (ZITHROMAX) tablet 500 mg  Status:  Discontinued        500 mg Oral Daily 12/21/20 0812 12/22/20 1051   12/20/20 1900   cefTRIAXone (ROCEPHIN) 2 g in sodium chloride 0.9 % 100 mL IVPB  Status:  Discontinued        2 g 200 mL/hr over 30 Minutes Intravenous Every 24 hours 12/19/20 2058 12/22/20 1051   12/19/20 2100  azithromycin (ZITHROMAX) 500 mg in sodium chloride 0.9 % 250 mL IVPB  Status:  Discontinued        500 mg 250 mL/hr over 60 Minutes Intravenous Every 24 hours 12/19/20 2058 12/21/20 0812   12/19/20 1915  vancomycin (VANCOCIN) IVPB 1000 mg/200 mL premix        1,000 mg 200 mL/hr over 60 Minutes Intravenous  Once 12/19/20 1906 12/19/20 2100   12/19/20 1915  ceFEPIme (MAXIPIME) 2 g in sodium chloride 0.9 % 100 mL IVPB        2 g 200 mL/hr over 30 Minutes Intravenous  Once 12/19/20 1906 12/19/20 2021         Subjective: Patient seen and examined.  Pleasant confused, flat affect.  Meal in front of the patient, she does not want to eat.  Denies any shortness of breath today. Very difficult historian.  Objective: Vitals:   12/21/20 1540 12/21/20 1927 12/22/20 0408 12/22/20 0757  BP: (!) 106/56 112/63 110/63 (!) 126/47  Pulse: 79 72 70 71  Resp: Temp: 98.5 F (36.9 C) 98.1 F (36.7 C) 97.8 F (36.6 C) 98.5 F (36.9 C)  TempSrc: Oral Oral  Oral  SpO2: 98% 99% 96% 100%  Weight:      Height:        Intake/Output Summary (Last 24 hours) at 12/22/2020 1053 Last data filed at 12/22/2020 0931 Gross per 24 hour  Intake 3394.94 ml  Output 1350 ml  Net 2044.94 ml   Filed Weights   12/19/20 1709 12/19/20 2229 12/21/20 0428  Weight: 49.4 kg 55.3 kg 81.4 kg    Examination:  General exam: Chronically sick looking debilitated and frail female complains of shortness of breath.  Looks fairly comfortable at rest. Respiratory system: Mostly conducted airway sounds.SpO2: 100 % O2 Flow Rate (L/min): 2.5 L/min  Cardiovascular system: S1 & S2 heard, RRR. No JVD, murmurs, rubs, gallops or clicks. No pedal edema. Gastrointestinal system: Abdomen is nondistended, soft and nontender. No  organomegaly or masses felt. Normal bowel sounds heard. Central nervous system: Alert and oriented x2-3.  Generalized weakness.  Very frail and debilitated. Extremities: Symmetric 5 x 5 power. Skin: No rashes, lesions or ulcers Psychiatry: Judgement and insight appear normal. Mood is anxious and affect is flat.    Data Reviewed: I have personally reviewed following labs and imaging studies  CBC: Recent Labs  Lab 12/16/20 0425 12/19/20 1711 12/20/20 0532 12/21/20 0414 12/22/20 0352  WBC 10.1 11.2* 7.9 6.9 7.9  NEUTROABS  --  7.7  --  3.4 4.2  HGB 9.7* 11.3* 9.5* 10.9* 10.6*  HCT 30.8* 35.4* 29.7* 33.1* 33.3*  MCV 91.1 90.1 91.1 90.9 92.0  PLT 547* 667* 531* 580* 571*   Basic Metabolic Panel: Recent Labs  Lab 12/16/20 0425 12/19/20 1711 12/20/20 0532 12/21/20 0414 12/22/20 0352  NA 141 142 140 142 142  K 3.8 3.3* 3.8 4.1 4.2  CL 108 106 110 110 108  CO2 25 26 23 26 27   GLUCOSE 89 90 74 95 99  BUN 12 18 13 8 13   CREATININE 0.46 0.58 0.63 0.48 0.46  CALCIUM 8.6* 9.2 8.5* 8.8* 8.8*  MG 1.2* 1.1*  --  1.9 1.5*  PHOS 2.5  --   --  2.9 2.9   GFR: Estimated Creatinine Clearance: 61.5 mL/min (by C-G formula based on SCr of 0.46 mg/dL). Liver Function Tests: Recent Labs  Lab 12/20/20 0532 12/22/20 0352  AST 18 15  ALT 15 13  ALKPHOS 49 54  BILITOT 0.7 0.2*  PROT 4.9* 5.2*  ALBUMIN 2.3* 2.4*   No results for input(s): LIPASE, AMYLASE in the last 168 hours. No results for input(s): AMMONIA in the last 168 hours. Coagulation Profile: No results for input(s): INR, PROTIME in the last 168 hours. Cardiac Enzymes: No results for input(s): CKTOTAL, CKMB, CKMBINDEX, TROPONINI in the last 168 hours. BNP (last 3 results) No results for input(s): PROBNP in the last 8760 hours. HbA1C: No results for input(s): HGBA1C in the last 72 hours. CBG: Recent Labs  Lab 12/21/20 0409 12/21/20 0730 12/21/20 1137 12/21/20 1542 12/21/20 2054  GLUCAP 105* 133* 117* 142* 105*    Lipid Profile: No results for input(s): CHOL, HDL, LDLCALC, TRIG, CHOLHDL, LDLDIRECT in the last 72 hours. Thyroid Function Tests: No results for input(s): TSH, T4TOTAL, FREET4, T3FREE, THYROIDAB in the last 72 hours. Anemia Panel: No results for input(s): VITAMINB12, FOLATE, FERRITIN, TIBC, IRON, RETICCTPCT in the last 72 hours. Sepsis Labs: Recent Labs  Lab 12/19/20 1711 12/20/20 0532 12/21/20 0414  PROCALCITON <0.10 <0.10 <0.10    Recent Results (from the past 240 hour(s))  Resp Panel by RT-PCR (Flu A&B, Covid) Nasopharyngeal Swab     Status: None   Collection Time: 12/12/20  5:27 PM   Specimen: Nasopharyngeal Swab; Nasopharyngeal(NP) swabs in vial transport medium  Result Value Ref Range Status   SARS Coronavirus 2 by RT PCR NEGATIVE NEGATIVE Final    Comment: (NOTE) SARS-CoV-2 target nucleic acids are NOT DETECTED.  The SARS-CoV-2 RNA is generally detectable in upper respiratory specimens during the acute phase of infection. The lowest concentration of SARS-CoV-2 viral copies this assay can detect is 138 copies/mL. A negative result does not preclude SARS-Cov-2 infection and should not be used as the sole basis for treatment or other patient management decisions. A negative result may occur with  improper specimen collection/handling, submission of specimen other than nasopharyngeal swab, presence of viral mutation(s) within the areas targeted by this assay, and inadequate number of viral copies(<138 copies/mL). A negative result must be combined with clinical observations, patient history, and epidemiological information. The expected result is Negative.  Fact Sheet for Patients:  02/20/21  Fact Sheet for Healthcare Providers:  02/11/21  This test is no t yet approved or cleared by the [employer] FDA and  has been authorized for detection and/or diagnosis of SARS-CoV-2 by FDA under an Emergency  Use Authorization (EUA). This EUA will remain  in effect (meaning this test can be used) for the duration of the COVID-19 declaration under Section 564(b)(1) of the Act, 21 U.S.C.section 360bbb-3(b)(1), unless the authorization is terminated  or revoked sooner.       Influenza A by PCR NEGATIVE NEGATIVE Final   Influenza B by PCR NEGATIVE NEGATIVE Final    Comment: (NOTE) The Xpert Xpress SARS-CoV-2/FLU/RSV plus assay is intended as an aid in the diagnosis of influenza from Nasopharyngeal swab specimens  and should not be used as a sole basis for treatment. Nasal washings and aspirates are unacceptable for Xpert Xpress SARS-CoV-2/FLU/RSV testing.  Fact Sheet for Patients: [employer]Course[employer]com  Fact Sheet for Healthcare Providers: SeriousBroker.it  This test is not yet approved or cleared by the [place] FDA and has been authorized for detection and/or diagnosis of SARS-CoV-2 by FDA under an Emergency Use Authorization (EUA). This EUA will remain in effect (meaning this test can be used) for the duration of the COVID-19 declaration under Section 564(b)(1) of the Act, 21 U.S.C. section 360bbb-3(b)(1), unless the authorization is terminated or revoked.  Performed at [facility], [address] [place], [place] [address]   Blood culture (routine x 2)     Status: None   Collection Time: 12/12/20  5:27 PM   Specimen: BLOOD  Result Value Ref Range Status   Specimen Description BLOOD LEFT ANTECUBITAL  Final   Special Requests   Final    BOTTLES DRAWN AEROBIC AND ANAEROBIC Blood Culture results may not be optimal due to an inadequate volume of blood received in culture bottles   Culture   Final    NO GROWTH 5 DAYS Performed at [facility], [address] [place], [place] [address]    Report Status 12/17/2020 FINAL  Final  Blood culture (routine x 2)     Status: None   Collection Time: 12/12/20  5:27 PM    Specimen: BLOOD  Result Value Ref Range Status   Specimen Description BLOOD BLOOD RIGHT WRIST  Final   Special Requests   Final    BOTTLES DRAWN AEROBIC AND ANAEROBIC Blood Culture results may not be optimal due to an inadequate volume of blood received in culture bottles   Culture   Final    NO GROWTH 5 DAYS Performed at [facility], [address] [place], [place] [address]    Report Status 12/17/2020 FINAL  Final  MRSA PCR Screening     Status: None   Collection Time: 12/15/20  6:00 AM   Specimen: Nasopharyngeal  Result Value Ref Range Status   MRSA by PCR NEGATIVE NEGATIVE Final    Comment:        The GeneXpert MRSA Assay (FDA approved for NASAL specimens only), is one component of a comprehensive MRSA colonization surveillance program. It is not intended to diagnose MRSA infection nor to guide or monitor treatment for MRSA infections. Performed at [facility], [address] [place], [place] [address]   Resp Panel by RT-PCR (Flu A&B, Covid) Nasopharyngeal Swab     Status: None   Collection Time: 12/19/20  5:50 PM   Specimen: Nasopharyngeal Swab; Nasopharyngeal(NP) swabs in vial transport medium  Result Value Ref Range Status   SARS Coronavirus 2 by RT PCR NEGATIVE NEGATIVE Final    Comment: (NOTE) SARS-CoV-2 target nucleic acids are NOT DETECTED.  The SARS-CoV-2 RNA is generally detectable in upper respiratory specimens during the acute phase of infection. The lowest concentration of SARS-CoV-2 viral copies this assay can detect is 138 copies/mL. A negative result does not preclude SARS-Cov-2 infection and should not be used as the sole basis for treatment or other patient management decisions. A negative result may occur with  improper specimen collection/handling, submission of specimen other than nasopharyngeal swab, presence of viral mutation(s) within the areas targeted by this assay, and inadequate number of viral copies(<138 copies/mL).  A negative result must be combined with clinical observations, patient history, and epidemiological information. The expected result  is Negative.  Fact Sheet for Patients:  BloggerCourse.com  Fact Sheet for Healthcare Providers:  SeriousBroker.it  This test is no t yet approved or cleared by the [place] FDA and  has been authorized for detection and/or diagnosis of SARS-CoV-2 by FDA under an Emergency Use Authorization (EUA). This EUA will remain  in effect (meaning this test can be used) for the duration of the COVID-19 declaration under Section 564(b)(1) of the Act, 21 U.S.C.section 360bbb-3(b)(1), unless the authorization is terminated  or revoked sooner.       Influenza A by PCR NEGATIVE NEGATIVE Final   Influenza B by PCR NEGATIVE NEGATIVE Final    Comment: (NOTE) The Xpert Xpress SARS-CoV-2/FLU/RSV plus assay is intended as an aid in the diagnosis of influenza from Nasopharyngeal swab specimens and should not be used as a sole basis for treatment. Nasal washings and aspirates are unacceptable for Xpert Xpress SARS-CoV-2/FLU/RSV testing.  Fact Sheet for Patients: BloggerCourse.com  Fact Sheet for Healthcare Providers: SeriousBroker.it  This test is not yet approved or cleared by the [place] FDA and has been authorized for detection and/or diagnosis of SARS-CoV-2 by FDA under an Emergency Use Authorization (EUA). This EUA will remain in effect (meaning this test can be used) for the duration of the COVID-19 declaration under Section 564(b)(1) of the Act, 21 U.S.C. section 360bbb-3(b)(1), unless the authorization is terminated or revoked.  Performed at [facility], [address] [place], [place] [address]   Blood culture (routine x 2)     Status: None (Preliminary result)   Collection Time: 12/19/20  7:20 PM   Specimen: BLOOD  Result Value Ref  Range Status   Specimen Description BLOOD  RIGHT FOREARM   Final   Special Requests   Final    BOTTLES DRAWN AEROBIC AND ANAEROBIC Blood Culture adequate volume   Culture   Final    NO GROWTH 3 DAYS Performed at [facility], [address], [place], [place] [address]    Report Status PENDING  Incomplete  Blood culture (routine x 2)     Status: None (Preliminary result)   Collection Time: 12/19/20  7:30 PM   Specimen: BLOOD  Result Value Ref Range Status   Specimen Description BLOOD  LEFT FOREARM  Final   Special Requests   Final    BOTTLES DRAWN AEROBIC AND ANAEROBIC Blood Culture results may not be optimal due to an inadequate volume of blood received in culture bottles   Culture   Final    NO GROWTH 3 DAYS Performed at [facility], [address], [place], [place] [address]    Report Status PENDING  Incomplete  MRSA PCR Screening     Status: None   Collection Time: 12/20/20  6:09 AM   Specimen: Nasal Mucosa; Nasopharyngeal  Result Value Ref Range Status   MRSA by PCR NEGATIVE NEGATIVE Final    Comment:        The GeneXpert MRSA Assay (FDA approved for NASAL specimens only), is one component of a comprehensive MRSA colonization surveillance program. It is not intended to diagnose MRSA infection nor to guide or monitor treatment for MRSA infections. Performed at [facility], [address] [place], [place] [address]   Culture, Urine     Status: None   Collection Time: 12/20/20  7:52 AM   Specimen: Urine, Random  Result Value Ref Range Status   Specimen Description   Final    URINE, RANDOM Performed at [facility], [address] [place], [place] [address]    Special Requests   Final    NONE Performed at [facility], [address] [place], [place] [address]    Culture   Final    NO GROWTH Performed at [facility], [address] [place], [place] [address]    Report Status 12/21/2020 FINAL  Final          Radiology Studies: ECHOCARDIOGRAM COMPLETE  Result Date: 12/21/2020    ECHOCARDIOGRAM REPORT   Patient Name:   TANAZIA ACHEE Pieper Date of Exam: 12/21/2020 Medical Rec #:  [id number]       Height:       67.0 in Accession #:    [id number]      Weight:       179.5 lb Date of Birth:  [birth year]        BSA:          1.931 m Patient Age:    79 years        BP:           113/53 mmHg Patient Gender: F               HR:           81 bpm. Exam Location:  [facility] Procedure: 2D Echo and Strain Analysis Indications:     Dyspnea R06.00  History:         Patient has no prior history of Echocardiogram examinations.  Sonographer:     Wonda Cerise RDCS Referring Phys:  [id number] Dorcas Carrow Diagnosing Phys: Jodelle Red MD IMPRESSIONS  1. Left ventricular ejection fraction, by estimation, is 65 to 70%. The left ventricle has normal function. The left ventricle has no regional wall motion abnormalities. Left ventricular diastolic parameters are indeterminate.  2. Right ventricular systolic function is normal. The right ventricular size is normal. Tricuspid regurgitation signal is inadequate for assessing PA pressure.  3. Right atrial size was mildly dilated.  4. The mitral valve is grossly normal. Trivial mitral valve regurgitation. No evidence of mitral stenosis.  5. The aortic valve was not well visualized. There is moderate calcification of the aortic valve. There is mild thickening of the aortic valve. Aortic valve regurgitation is not visualized. Mild to moderate aortic valve sclerosis/calcification is present, without any evidence of aortic stenosis.  6. The inferior vena cava is normal in size with greater than 50% respiratory variability, suggesting right atrial pressure of 3 mmHg. Comparison(s): No prior Echocardiogram. Conclusion(s)/Recommendation(s): Otherwise normal echocardiogram, with minor abnormalities described in the report. FINDINGS  Left Ventricle: Left ventricular ejection fraction, by  estimation, is 65 to 70%. The left ventricle has normal function. The left ventricle has no regional wall motion abnormalities. Global longitudinal strain performed but not reported based on interpreter judgement due to suboptimal tracking. The left ventricular internal cavity size was normal in size. There is no left ventricular hypertrophy. Left ventricular diastolic parameters are indeterminate. Right Ventricle: The right ventricular size is normal. Right vetricular wall thickness was not well visualized. Right ventricular systolic function is normal. Tricuspid regurgitation signal is inadequate for assessing PA pressure. Left Atrium: Left atrial size was normal in size. Right Atrium: Right atrial size was mildly dilated. Pericardium: There is no evidence of pericardial effusion. Mitral Valve: The mitral valve is grossly normal. Trivial mitral valve regurgitation. No evidence of mitral valve stenosis. Tricuspid Valve: The tricuspid valve is normal in structure. Tricuspid valve regurgitation is trivial. No evidence of  tricuspid stenosis. Aortic Valve: The aortic valve was not well visualized. There is moderate calcification of the aortic valve. There is mild thickening of the aortic valve. Aortic valve regurgitation is not visualized. Mild to moderate aortic valve sclerosis/calcification  is present, without any evidence of aortic stenosis. Aortic valve peak gradient measures 12.0 mmHg. Pulmonic Valve: The pulmonic valve was not well visualized. Pulmonic valve regurgitation is not visualized. No evidence of pulmonic stenosis. Aorta: The aortic root and ascending aorta are structurally normal, with no evidence of dilitation. Venous: The inferior vena cava is normal in size with greater than 50% respiratory variability, suggesting right atrial pressure of 3 mmHg. IAS/Shunts: The atrial septum is grossly normal.  LEFT VENTRICLE PLAX 2D LVIDd:         3.69 cm  Diastology LVIDs:         2.26 cm  LV e' medial:    8.49  cm/s LV PW:         0.87 cm  LV E/e' medial:  8.2 LV IVS:        0.90 cm  LV e' lateral:   6.20 cm/s LVOT diam:     1.80 cm  LV E/e' lateral: 11.3 LV SV:         55 LV SV Index:   29 LVOT Area:     2.54 cm  RIGHT VENTRICLE RV Basal diam:  2.89 cm RV S prime:     15.20 cm/s TAPSE (M-mode): 1.9 cm LEFT ATRIUM             Index       RIGHT ATRIUM           Index LA diam:        3.00 cm 1.55 cm/m  RA Area:     14.50 cm LA Vol (A2C):   31.6 ml 16.36 ml/m RA Volume:   39.90 ml  20.66 ml/m LA Vol (A4C):   34.9 ml 18.07 ml/m LA Biplane Vol: 35.0 ml 18.12 ml/m  AORTIC VALVE                PULMONIC VALVE AV Area (Vmax): 1.86 cm    PV Vmax:       1.02 m/s AV Vmax:        173.50 cm/s PV Peak grad:  4.2 mmHg AV Peak Grad:   12.0 mmHg LVOT Vmax:      127.00 cm/s LVOT Vmean:     74.400 cm/s LVOT VTI:       0.218 m  AORTA Ao Root diam: 3.00 cm MITRAL VALVE               TRICUSPID VALVE MV Area (PHT): 3.36 cm    TV Peak grad:   28.8 mmHg MV Decel Time: 226 msec    TV Vmax:        2.68 m/s MV E velocity: 69.80 cm/s MV A velocity: 98.50 cm/s  SHUNTS MV E/A ratio:  0.71        Systemic VTI:  0.22 m                            Systemic Diam: 1.80 cm Jodelle Red MD Electronically signed by Jodelle Red MD Signature Date/Time: 12/21/2020/5:38:20 PM    Final         Scheduled Meds: . aspirin  81 mg Oral Daily  . divalproex  125 mg Oral BID  . DULoxetine  60  mg Oral Daily  . enoxaparin (LOVENOX) injection  40 mg Subcutaneous Q24H  . feeding supplement  237 mL Oral BID BM  . lactulose  30 g Oral Daily  . lamoTRIgine  25 mg Oral Daily  . magnesium oxide  400 mg Oral BID  . mouth rinse  15 mL Mouth Rinse BID  . mirtazapine  30 mg Oral Daily  . multivitamin with minerals  1 tablet Oral Daily  . OLANZapine  5 mg Oral QHS  . pantoprazole  40 mg Oral Daily  . rivastigmine  6 mg Oral BID  . rosuvastatin  10 mg Oral Daily  . senna-docusate  2 tablet Oral BID  . sodium chloride flush  3 mL Intravenous  Q12H  . traZODone  50 mg Oral QHS   Continuous Infusions: . sodium chloride Stopped (12/21/20 0432)  . dextrose 5% lactated ringers 75 mL/hr at 12/22/20 0931  . magnesium sulfate bolus IVPB 2 g (12/22/20 1023)     LOS: 2 days    Time spent: 32 minutes    Dorcas CarrowKuber Lorie Cleckley, MD [facility] Pager [phone]

## 2020-12-22 NOTE — Progress Notes (Signed)
Patient states she is having difficulty with breathing. Patient was unable to give further details, just continues to repeat the same comment. Patient observed to not be in distress and [place] is inserted properly in patient's nose. Encouraged IS with patient, but due to cognitive impairment patient will need continuous reinforcement.      12/22/20 1611  Respiratory  Respiratory Interventions Incentive spirometry w/ teach back  Incentive Spirometry  Incentive Spirometry - Achieved (mL) (RN, NT, or RT) 500 mL  Incentive Spirometry - # of Times (RN or NT) 8  Incentive Spirometry Effort (RN) Needs reinforcement

## 2020-12-23 LAB — GLUCOSE, CAPILLARY
Glucose-Capillary: 91 mg/dL (ref 70–99)
Glucose-Capillary: 87 mg/dL (ref 70–99)
Glucose-Capillary: 97 mg/dL (ref 70–99)

## 2020-12-23 MED ORDER — MAGNESIUM SULFATE 2 GM/50ML IV SOLN
2.0000 g | Freq: Once | INTRAVENOUS | Status: AC
  Administered 2020-12-23: 2 g via INTRAVENOUS
  Filled 2020-12-23: qty 50

## 2020-12-23 NOTE — TOC Initial Note (Signed)
Transition of Care [facility]) - Initial/Assessment Note    Patient Details  Name: [name] MRN: [id number] Date of Birth: 22-Feb-1941  Transition of [facility]) CM/SW Contact:    Chapman Fitch, RN Phone Number: 12/23/2020, 1:41 PM  Clinical Narrative:                  Received return call from Pietro Cassis  With DSS. She is in agreement to bed search for SNF.  She request call for update from MD.  MD notified.   Palliative consult pending.  PASRR went to level 2.  FL2 sent for signature.  After signed clinical will need to be sent to [facility].  MD notified,  Bed search initiated        Patient Goals and CMS Choice        Expected Discharge Plan and Services                                                Prior Living Arrangements/Services                       Activities of Daily Living   ADL Screening (condition at time of admission) Patient's cognitive ability adequate to safely complete daily activities?: No Is the patient deaf or have difficulty hearing?: No Does the patient have difficulty concentrating, remembering, or making decisions?: Yes Does the patient have difficulty dressing or bathing?: Yes Independently performs ADLs?: No Communication: Independent Dressing (OT): Needs assistance Is this a change from baseline?: Pre-admission baseline Grooming: Needs assistance Is this a change from baseline?: Pre-admission baseline Bathing: Needs assistance Is this a change from baseline?: Pre-admission baseline Toileting: Needs assistance Is this a change from baseline?: Pre-admission baseline In/Out Bed: Needs assistance Is this a change from baseline?: Pre-admission baseline Walks in Home: Needs assistance (wheelchair) Is this a change from baseline?: Pre-admission baseline Weakness of Legs: Both  Permission Sought/Granted                  Emotional Assessment              Admission diagnosis:  Shortness of breath  [R06.02] HCAP (healthcare-associated pneumonia) [J18.9] Patient Active Problem List   Diagnosis Date Noted  . HCAP (healthcare-associated pneumonia) 12/19/2020  . PAD (peripheral artery disease) (HCC) 12/19/2020  . HLD (hyperlipidemia) 12/19/2020  . Insomnia 12/19/2020  . GERD (gastroesophageal reflux disease) 12/19/2020  . Bipolar disorder (HCC) 12/15/2020  . Dementia with behavioral disturbance (HCC) 12/15/2020  . SOB (shortness of breath) 12/12/2020  . Acute respiratory failure with hypoxia (HCC) 12/12/2020  . Community acquired pneumonia 12/12/2020  . Pressure injury of skin 09/25/2019  . Fall   . Closed left subtrochanteric femur fracture (HCC) 09/21/2019   PCP:  Patient, No Pcp Per (Inactive) Pharmacy:  No Pharmacies Listed    Social Determinants of Health (SDOH) Interventions    Readmission Risk Interventions No flowsheet data found.

## 2020-12-23 NOTE — Plan of Care (Signed)
  Problem: Nutrition: Goal: Adequate nutrition will be maintained Outcome: Progressing   

## 2020-12-23 NOTE — Progress Notes (Signed)
     Referral received for [name] :goals of care discussion. Chart reviewed and updates received from RN. Patient assessed and is unable to engage appropriately in discussions. Attempted to contact patient's DSS Guardian, [name]. Unable to reach. Voicemail left with contact information given.   PMT will re-attempt to contact family at a later time/date. Detailed note and recommendations to follow once GOC has been completed.   Thank you for your referral and allowing PMT to assist in [name]'s care.   Willette Alma, AGPCNP-BC Palliative Medicine Team  Phone: [phone] Pager: [phone] Amion: N. Cousar   NO CHARGE

## 2020-12-23 NOTE — [place] FL2 (Signed)
[place] MEDICAID FL2 LEVEL OF CARE SCREENING TOOL     IDENTIFICATION  Patient Name: [name] Birthdate: [birth year] Sex: female Admission Date (Current Location): 12/19/2020  [place] and [place] Number:  Chiropodist and Address:  [facility], [address], [place], [place] [address]      Provider Number: [id number]  Attending Physician Name and Address:  Lynn Ito, MD  Relative Name and Phone Number:  Pietro Cassis -Legal Guardian [phone]    Current Level of Care: Hospital Recommended Level of Care: Skilled Nursing Facility Prior Approval Number:    Date Approved/Denied:   PASRR Number: Pending  Discharge Plan: SNF    Current Diagnoses: Patient Active Problem List   Diagnosis Date Noted  . HCAP (healthcare-associated pneumonia) 12/19/2020  . PAD (peripheral artery disease) (HCC) 12/19/2020  . HLD (hyperlipidemia) 12/19/2020  . Insomnia 12/19/2020  . GERD (gastroesophageal reflux disease) 12/19/2020  . Bipolar disorder (HCC) 12/15/2020  . Dementia with behavioral disturbance (HCC) 12/15/2020  . SOB (shortness of breath) 12/12/2020  . Acute respiratory failure with hypoxia (HCC) 12/12/2020  . Community acquired pneumonia 12/12/2020  . Pressure injury of skin 09/25/2019  . Fall   . Closed left subtrochanteric femur fracture (HCC) 09/21/2019    Orientation RESPIRATION BLADDER Height & Weight     Self,Place  O2 (2.5 Malin) Incontinent,External catheter Weight: 56.3 kg Height:  5\' 7"  (170.2 cm)  BEHAVIORAL SYMPTOMS/MOOD NEUROLOGICAL BOWEL NUTRITION STATUS      Incontinent Diet (Regular)  AMBULATORY STATUS COMMUNICATION OF NEEDS Skin   Limited Assist (WC bound as baseline.  Needs assistance with transfers) Verbally Normal                       Personal Care Assistance Level of Assistance              Functional Limitations Info             SPECIAL CARE FACTORS FREQUENCY  PT (By licensed PT),OT (By  licensed OT)                    Contractures Contractures Info: Not present    Additional Factors Info  Code Status,Allergies Code Status Info: DNR Allergies Info: NKDA           Current Medications (12/23/2020):  This is the current hospital active medication list Current Facility-Administered Medications  Medication Dose Route Frequency Provider Last Rate Last Admin  . 0.9 %  sodium chloride infusion   Intravenous PRN 02/22/2021, MD   Stopped at 12/21/20 0432  . acetaminophen (TYLENOL) tablet 650 mg  650 mg Oral Q6H PRN 02/20/21, MD   650 mg at 12/19/20 2347   Or  . acetaminophen (TYLENOL) suppository 650 mg  650 mg Rectal Q6H PRN 2348, MD      . alum & mag hydroxide-simeth (MAALOX/MYLANTA) 200-200-20 MG/5ML suspension 30 mL  30 mL Oral QID PRN 03-22-2001, MD      . aspirin chewable tablet 81 mg  81 mg Oral Daily Synetta Fail, MD   81 mg at 12/23/20 1030  . camphor-menthol (SARNA) lotion 1 application  1 application Topical QID PRN 02/22/21, MD      . divalproex (DEPAKOTE) DR tablet 125 mg  125 mg Oral BID Synetta Fail, MD   125 mg at 12/23/20 1030  . DULoxetine (CYMBALTA) DR capsule 60 mg  60 mg Oral  Daily Synetta Fail, MD   60 mg at 12/23/20 1029  . enoxaparin (LOVENOX) injection 40 mg  40 mg Subcutaneous Q24H Synetta Fail, MD   40 mg at 12/22/20 2132  . feeding supplement (ENSURE ENLIVE / ENSURE PLUS) liquid 237 mL  237 mL Oral BID BM Synetta Fail, MD   237 mL at 12/23/20 1044  . ipratropium-albuterol (DUONEB) 0.5-2.5 (3) MG/3ML nebulizer solution 3 mL  3 mL Nebulization Q6H PRN Synetta Fail, MD   3 mL at 12/21/20 0425  . lactulose (CHRONULAC) 10 GM/15ML solution 30 g  30 g Oral Daily Synetta Fail, MD   30 g at 12/22/20 0950  . lamoTRIgine (LAMICTAL) tablet 25 mg  25 mg Oral Daily Synetta Fail, MD   25 mg at 12/23/20 1030  . magnesium hydroxide (MILK OF MAGNESIA)  suspension 30 mL  30 mL Oral QHS PRN Synetta Fail, MD      . magnesium oxide (MAG-OX) tablet 400 mg  400 mg Oral BID Dorcas Carrow, MD   400 mg at 12/23/20 1029  . MEDLINE mouth rinse  15 mL Mouth Rinse BID Synetta Fail, MD   15 mL at 12/23/20 1030  . mirtazapine (REMERON) tablet 30 mg  30 mg Oral Daily Synetta Fail, MD   30 mg at 12/23/20 1029  . multivitamin with minerals tablet 1 tablet  1 tablet Oral Daily Synetta Fail, MD   1 tablet at 12/23/20 1030  . OLANZapine (ZYPREXA) tablet 5 mg  5 mg Oral QHS Synetta Fail, MD   5 mg at 12/22/20 2135  . ondansetron (ZOFRAN-ODT) disintegrating tablet 4 mg  4 mg Oral Q6H PRN Synetta Fail, MD      . pantoprazole (PROTONIX) EC tablet 40 mg  40 mg Oral Daily Synetta Fail, MD   40 mg at 12/23/20 1030  . polyethylene glycol (MIRALAX / GLYCOLAX) packet 17 g  17 g Oral Daily PRN Synetta Fail, MD      . rivastigmine (EXELON) capsule 6 mg  6 mg Oral BID Synetta Fail, MD   6 mg at 12/23/20 1030  . rosuvastatin (CRESTOR) tablet 10 mg  10 mg Oral Daily Synetta Fail, MD   10 mg at 12/23/20 1030  . senna-docusate (Senokot-S) tablet 2 tablet  2 tablet Oral BID Synetta Fail, MD   2 tablet at 12/22/20 2134  . sodium chloride flush (NS) 0.9 % injection 3 mL  3 mL Intravenous Q12H Synetta Fail, MD   3 mL at 12/23/20 1043  . traZODone (DESYREL) tablet 50 mg  50 mg Oral QHS Synetta Fail, MD   50 mg at 12/22/20 2134  . zolpidem (AMBIEN) tablet 5 mg  5 mg Oral QHS PRN Dorcas Carrow, MD         Discharge Medications: Please see discharge summary for a list of discharge medications.  Relevant Imaging Results:  Relevant Lab Results:   Additional Information SS#: [id number]. Has a legal guardian through [facility] DSS: Pietro Cassis [phone]. Lives at [facility].  Chapman Fitch, RN

## 2020-12-23 NOTE — Progress Notes (Signed)
PROGRESS NOTE    COURNEY GARROD  WUJ:[id number] DOB: Jul 29, 1941 DOA: 12/19/2020 PCP: Patient, No Pcp Per (Inactive)    Brief Narrative:  80 year old female from assisted living facility with history of bipolar disorder, dementia, insomnia, peripheral arterial disease, GERD and chronic pain syndrome who was recently admitted to the hospital brought back to the emergency room with shortness of breath found at her assisted living place.  Reportedly patient was on amoxicillin for UTI.  She was recently admitted to the hospital 4/1-4/5 for similar symptoms and treated for pneumonia.  She was discharged on 2 L oxygen.  Patient poor historian.  In the emergency room, on 2 L oxygen.  Potassium 3.3.  Otherwise electrolytes normal.  Chest x-ray with a small left sided effusion and left basilar opacity more prominent than a week ago.  Increasing right patchy opacities.  Was given vancomycin cefepime in the ER due to recent hospitalization and admitted. Patient remains in the hospital with not eating, hypoglycemic without IV dextrose. She has a Field seismologist guardian, Child psychotherapist.  4/12-reports not hungry. No other complaints  Assessment & Plan:   Principal Problem:   Acute respiratory failure with hypoxia (HCC) Active Problems:   Bipolar disorder (HCC)   Dementia with behavioral disturbance (HCC)   HCAP (healthcare-associated pneumonia)   PAD (peripheral artery disease) (HCC)   HLD (hyperlipidemia)   Insomnia   GERD (gastroesophageal reflux disease)  Pneumonia, present on admission.  Suspected.  Ruled out.  Less likely.  Recently completely treated.  Will discontinue all antibiotics. Procalcitonin was normal. Seen by speech on 4/2, no evidence of aspiration. 2D echocardiogram with normal ejection fraction. We will try incentive spirometry, mobility, breathing exercises and bronchodilator therapies.   4/12-PT/OT - rec. snf Keep O2 sat 92 and above percent  Hypokalemia: Replaced and remained  stable  Severe hypomagnesemia: Replaced.  Ck am labs  Hypoglycemia: Due to poor oral intake.   was maintenance dextrose Still not much appetite.  Encouraged po intake  Dementia/bipolar disorder and insomnia: On multiple medications including lamotrigine, olanzapine, trazodone, mirtazapine, Depakote, rivastigmine and duloxetine that we will continue.  Hypoglycemia/recurrent hospitalization/severe electrolyte abnormalities due to poor oral intake and adult failure to thrive: Looks like patient has recently worsening clinical condition.   4/12-palliative following , see note   Patient continues to be on dextrose drip to keep up her blood sugars.  She is not eating. Not stable for discharge.  Patient continues to not eating enough.  She does not have an appetite.  Her blood sugars are maintained on dextrose infusion.  With her advanced age and frailty, she likely candidate for hospice.  Will be seen by palliative today. Waiting for callback from her guardian, case management trying to connect.   DVT prophylaxis: enoxaparin (LOVENOX) injection 40 mg Start: 12/19/20 2200   Code Status: DNR Family Communication: No family.  Case management is waiting callback from legal guardian. Disposition Plan: Status is: Inpatient.  Dispo: The patient is from: ALF              Anticipated d/c is to: Unknown.  Skilled nursing facility vs hospice.              Patient currently is not medically stable to d/c.   Difficult to place patient No     Consultants:   Palliative  Procedures:   None  Antimicrobials:  Anti-infectives (From admission, onward)   Start     Dose/Rate Route Frequency Ordered Stop   12/21/20 2100  azithromycin (ZITHROMAX) tablet 500 mg  Status:  Discontinued        500 mg Oral Daily 12/21/20 0812 12/22/20 1051   12/20/20 1900  cefTRIAXone (ROCEPHIN) 2 g in sodium chloride 0.9 % 100 mL IVPB  Status:  Discontinued        2 g 200 mL/hr over 30 Minutes Intravenous Every 24  hours 12/19/20 2058 12/22/20 1051   12/19/20 2100  azithromycin (ZITHROMAX) 500 mg in sodium chloride 0.9 % 250 mL IVPB  Status:  Discontinued        500 mg 250 mL/hr over 60 Minutes Intravenous Every 24 hours 12/19/20 2058 12/21/20 0812   12/19/20 1915  vancomycin (VANCOCIN) IVPB 1000 mg/200 mL premix        1,000 mg 200 mL/hr over 60 Minutes Intravenous  Once 12/19/20 1906 12/19/20 2100   12/19/20 1915  ceFEPIme (MAXIPIME) 2 g in sodium chloride 0.9 % 100 mL IVPB        2 g 200 mL/hr over 30 Minutes Intravenous  Once 12/19/20 1906 12/19/20 2021         Subjective: No complaints.  Reports not being hungry.  Objective: Vitals:   12/22/20 1940 12/23/20 0405 12/23/20 0500 12/23/20 0820  BP: 122/61 (!) 116/57  115/63  Pulse: 85 75  68  Resp: 16     Temp: 98.6 F (37 C) 98.3 F (36.8 C)  98 F (36.7 C)  TempSrc:  Oral  Axillary  SpO2: 97% 96%  100%  Weight:   56.3 kg   Height:        Intake/Output Summary (Last 24 hours) at 12/23/2020 1503 Last data filed at 12/23/2020 1300 Gross per 24 hour  Intake 594 ml  Output 550 ml  Net 44 ml   Filed Weights   12/19/20 2229 12/21/20 0428 12/23/20 0500  Weight: 55.3 kg 81.4 kg 56.3 kg    Examination: NAD, lying in bed CTA no wheeze rales rhonchi's  regular S1-S2 no gallops Soft benign positive bowel sounds No edema Mood and affect appropriate in current setting Grossly intact    Data Reviewed: I have personally reviewed following labs and imaging studies  CBC: Recent Labs  Lab 12/19/20 1711 12/20/20 0532 12/21/20 0414 12/22/20 0352  WBC 11.2* 7.9 6.9 7.9  NEUTROABS 7.7  --  3.4 4.2  HGB 11.3* 9.5* 10.9* 10.6*  HCT 35.4* 29.7* 33.1* 33.3*  MCV 90.1 91.1 90.9 92.0  PLT 667* 531* 580* 571*   Basic Metabolic Panel: Recent Labs  Lab 12/19/20 1711 12/20/20 0532 12/21/20 0414 12/22/20 0352  NA 142 140 142 142  K 3.3* 3.8 4.1 4.2  CL 106 110 110 108  CO2 26 23 26 27   GLUCOSE 90 74 95 99  BUN 18 13 8 13    CREATININE 0.58 0.63 0.48 0.46  CALCIUM 9.2 8.5* 8.8* 8.8*  MG 1.1*  --  1.9 1.5*  PHOS  --   --  2.9 2.9   GFR: Estimated Creatinine Clearance: 49.8 mL/min (by C-G formula based on SCr of 0.46 mg/dL). Liver Function Tests: Recent Labs  Lab 12/20/20 0532 12/22/20 0352  AST 18 15  ALT 15 13  ALKPHOS 49 54  BILITOT 0.7 0.2*  PROT 4.9* 5.2*  ALBUMIN 2.3* 2.4*   No results for input(s): LIPASE, AMYLASE in the last 168 hours. No results for input(s): AMMONIA in the last 168 hours. Coagulation Profile: No results for input(s): INR, PROTIME in the last 168 hours. Cardiac Enzymes: No results for  input(s): CKTOTAL, CKMB, CKMBINDEX, TROPONINI in the last 168 hours. BNP (last 3 results) No results for input(s): PROBNP in the last 8760 hours. HbA1C: No results for input(s): HGBA1C in the last 72 hours. CBG: Recent Labs  Lab 12/22/20 1130 12/22/20 1822 12/23/20 0241 12/23/20 0558 12/23/20 1154  GLUCAP 111* 97 91 87 97   Lipid Profile: No results for input(s): CHOL, HDL, LDLCALC, TRIG, CHOLHDL, LDLDIRECT in the last 72 hours. Thyroid Function Tests: No results for input(s): TSH, T4TOTAL, FREET4, T3FREE, THYROIDAB in the last 72 hours. Anemia Panel: No results for input(s): VITAMINB12, FOLATE, FERRITIN, TIBC, IRON, RETICCTPCT in the last 72 hours. Sepsis Labs: Recent Labs  Lab 12/19/20 1711 12/20/20 0532 12/21/20 0414  PROCALCITON <0.10 <0.10 <0.10    Recent Results (from the past 240 hour(s))  MRSA PCR Screening     Status: None   Collection Time: 12/15/20  6:00 AM   Specimen: Nasopharyngeal  Result Value Ref Range Status   MRSA by PCR NEGATIVE NEGATIVE Final    Comment:        The GeneXpert MRSA Assay (FDA approved for NASAL specimens only), is one component of a comprehensive MRSA colonization surveillance program. It is not intended to diagnose MRSA infection nor to guide or monitor treatment for MRSA infections. Performed at [facility], [address] [place], [place] [address]   Resp Panel by RT-PCR (Flu A&B, Covid) Nasopharyngeal Swab     Status: None   Collection Time: 12/19/20  5:50 PM   Specimen: Nasopharyngeal Swab; Nasopharyngeal(NP) swabs in vial transport medium  Result Value Ref Range Status   SARS Coronavirus 2 by RT PCR NEGATIVE NEGATIVE Final    Comment: (NOTE) SARS-CoV-2 target nucleic acids are NOT DETECTED.  The SARS-CoV-2 RNA is generally detectable in upper respiratory specimens during the acute phase of infection. The lowest concentration of SARS-CoV-2 viral copies this assay can detect is 138 copies/mL. A negative result does not preclude SARS-Cov-2 infection and should not be used as the sole basis for treatment or other patient management decisions. A negative result may occur with  improper specimen collection/handling, submission of specimen other than nasopharyngeal swab, presence of viral mutation(s) within the areas targeted by this assay, and inadequate number of viral copies(<138 copies/mL). A negative result must be combined with clinical observations, patient history, and epidemiological information. The expected result is Negative.  Fact Sheet for Patients:  BloggerCourse.com  Fact Sheet for Healthcare Providers:  SeriousBroker.it  This test is no t yet approved or cleared by the [place] FDA and  has been authorized for detection and/or diagnosis of SARS-CoV-2 by FDA under an Emergency Use Authorization (EUA). This EUA will remain  in effect (meaning this test can be used) for the duration of the COVID-19 declaration under Section 564(b)(1) of the Act, 21 U.S.C.section 360bbb-3(b)(1), unless the authorization is terminated  or revoked sooner.       Influenza A by PCR NEGATIVE NEGATIVE Final   Influenza B by PCR NEGATIVE NEGATIVE Final    Comment: (NOTE) The Xpert Xpress SARS-CoV-2/FLU/RSV plus assay is intended as an aid in  the diagnosis of influenza from Nasopharyngeal swab specimens and should not be used as a sole basis for treatment. Nasal washings and aspirates are unacceptable for Xpert Xpress SARS-CoV-2/FLU/RSV testing.  Fact Sheet for Patients: BloggerCourse.com  Fact Sheet for Healthcare Providers: SeriousBroker.it  This test is not yet approved or cleared by the [place] FDA and has been authorized for detection and/or diagnosis  of SARS-CoV-2 by FDA under an Emergency Use Authorization (EUA). This EUA will remain in effect (meaning this test can be used) for the duration of the COVID-19 declaration under Section 564(b)(1) of the Act, 21 U.S.C. section 360bbb-3(b)(1), unless the authorization is terminated or revoked.  Performed at [facility], [address] [place], [place] [address]   Blood culture (routine x 2)     Status: None (Preliminary result)   Collection Time: 12/19/20  7:20 PM   Specimen: BLOOD  Result Value Ref Range Status   Specimen Description BLOOD  RIGHT FOREARM   Final   Special Requests   Final    BOTTLES DRAWN AEROBIC AND ANAEROBIC Blood Culture adequate volume   Culture   Final    NO GROWTH 4 DAYS Performed at [facility], [address], [place], [place] [address]    Report Status PENDING  Incomplete  Blood culture (routine x 2)     Status: None (Preliminary result)   Collection Time: 12/19/20  7:30 PM   Specimen: BLOOD  Result Value Ref Range Status   Specimen Description BLOOD  LEFT FOREARM  Final   Special Requests   Final    BOTTLES DRAWN AEROBIC AND ANAEROBIC Blood Culture results may not be optimal due to an inadequate volume of blood received in culture bottles   Culture   Final    NO GROWTH 4 DAYS Performed at [facility], [address], [place], [place] [address]    Report Status PENDING  Incomplete  MRSA PCR Screening     Status: None   Collection Time:  12/20/20  6:09 AM   Specimen: Nasal Mucosa; Nasopharyngeal  Result Value Ref Range Status   MRSA by PCR NEGATIVE NEGATIVE Final    Comment:        The GeneXpert MRSA Assay (FDA approved for NASAL specimens only), is one component of a comprehensive MRSA colonization surveillance program. It is not intended to diagnose MRSA infection nor to guide or monitor treatment for MRSA infections. Performed at [facility], [address] [place], [place] [address]   Culture, Urine     Status: None   Collection Time: 12/20/20  7:52 AM   Specimen: Urine, Random  Result Value Ref Range Status   Specimen Description   Final    URINE, RANDOM Performed at [facility], [address] [place], [place] [address]    Special Requests   Final    NONE Performed at [facility], [address] [place], [place] [address]    Culture   Final    NO GROWTH Performed at [facility], [address] [place], [place] [address]    Report Status 12/21/2020 FINAL  Final         Radiology Studies: No results found.      Scheduled Meds: . aspirin  81 mg Oral Daily  . divalproex  125 mg Oral BID  . DULoxetine  60 mg Oral Daily  . enoxaparin (LOVENOX) injection  40 mg Subcutaneous Q24H  . feeding supplement  237 mL Oral BID BM  . lactulose  30 g Oral Daily  . lamoTRIgine  25 mg Oral Daily  . magnesium oxide  400 mg Oral BID  . mouth rinse  15 mL Mouth Rinse BID  . mirtazapine  30 mg Oral Daily  . multivitamin with minerals  1 tablet Oral Daily  . OLANZapine  5 mg Oral QHS  . pantoprazole  40 mg Oral Daily  . rivastigmine  6 mg Oral BID  . rosuvastatin  10 mg Oral Daily  . senna-docusate  2 tablet Oral BID  . sodium chloride flush  3 mL Intravenous Q12H  . traZODone  50 mg Oral QHS   Continuous Infusions: . sodium chloride Stopped (12/21/20 0432)     LOS: 3 days    Time spent: 32 minutes    Lynn Ito, MD [facility] Pager  [phone]

## 2020-12-23 NOTE — Progress Notes (Signed)
Physical Therapy Treatment Patient Details Name: [name] MRN: [id number] DOB: [birth year] Today's Date: 12/23/2020    History of Present Illness 80 year old female from assisted living facility with history of bipolar disorder, dementia, insomnia, peripheral arterial disease, GERD and chronic pain syndrome who was recently admitted to the hospital brought back to the emergency room with shortness of breath found at her assisted living place. Found to have acute respiratory failure with hypoxia, pneumonia,    PT Comments    Pt is making gradual progress towards goals with ability to squat pivot transfer to recliner. Follows commands well despite confusion, however demonstrates flat affect. All mobility performed on 2.5 L of O2 with sats at 91%. Pt presents with depressed affect, however agreeable to sit in recliner at this time. Will continue to progress as able.   Follow Up Recommendations  SNF     Equipment Recommendations  None recommended by PT    Recommendations for Other Services       Precautions / Restrictions Precautions Precautions: Fall Restrictions Weight Bearing Restrictions: No    Mobility  Bed Mobility Overal bed mobility: Needs Assistance Bed Mobility: Supine to Sit;Sit to Supine;Rolling Rolling: Min assist   Supine to sit: Max assist     General bed mobility comments: pt prefers to roll into sidelying prior to transitioning to EOB. Needs heavy assist to transition to EOB and complains of pain in B LEs with movement. Once seated, able to sit with upright posture and cga    Transfers Overall transfer level: Needs assistance Equipment used: 1 person hand held assist Transfers: Squat Pivot Transfers     Squat pivot transfers: Max assist     General transfer comment: Able to perform squat pivot transfer to recliner with max assist. Pt fearful and unable to stand fully upright  Ambulation/Gait             General Gait Details: not attempted as  patient reports she is non ambulatory baseline   Stairs             Wheelchair Mobility    Modified Rankin (Stroke Patients Only)       Balance Overall balance assessment: Needs assistance Sitting-balance support: Feet supported;No upper extremity supported Sitting balance-Leahy Scale: Good Sitting balance - Comments: Good sitting balance at EOB during UE assessment   Standing balance support: Bilateral upper extremity supported Standing balance-Leahy Scale: Poor                              Cognition Arousal/Alertness: Awake/alert Behavior During Therapy: Flat affect Overall Cognitive Status: History of cognitive impairments - at baseline                                 General Comments: oriented to person, lethargic, however easily awakens and able to participate. Very fearful of falling      Exercises Other Exercises Other Exercises: supine/seated ther-ex on B LE including LAQ, hip abd/add, SLRs, QS, and ankle pumps. All ther-ex performed x 10 reps with min assist and safe technique. Cues for correct technique    General Comments        Pertinent Vitals/Pain Pain Assessment: Faces Faces Pain Scale: Hurts even more Pain Location: B LE Pain Descriptors / Indicators: Discomfort Pain Intervention(s): Limited activity within patient's tolerance    Home Living  Prior Function            PT Goals (current goals can now be found in the care plan section) Acute Rehab PT Goals Patient Stated Goal: to improve breathing PT Goal Formulation: With patient Time For Goal Achievement: 01/04/21 Potential to Achieve Goals: Good Progress towards PT goals: Progressing toward goals    Frequency    Min 2X/week      PT Plan Current plan remains appropriate    Co-evaluation              AM-PAC PT "6 Clicks" Mobility   Outcome Measure  Help needed turning from your back to your side while in a flat  bed without using bedrails?: A Lot Help needed moving from lying on your back to sitting on the side of a flat bed without using bedrails?: A Lot Help needed moving to and from a bed to a chair (including a wheelchair)?: A Lot Help needed standing up from a chair using your arms (e.g., wheelchair or bedside chair)?: A Lot Help needed to walk in hospital room?: Total Help needed climbing 3-5 steps with a railing? : Total 6 Click Score: 10    End of Session Equipment Utilized During Treatment: Gait belt;Oxygen Activity Tolerance: Patient limited by fatigue Patient left: in chair;with chair alarm set Nurse Communication: Mobility status PT Visit Diagnosis: Unsteadiness on feet (R26.81);Muscle weakness (generalized) (M62.81)     Time: 1026-1050 PT Time Calculation (min) (ACUTE ONLY): 24 min  Charges:  $Therapeutic Exercise: 8-22 mins $Therapeutic Activity: 8-22 mins                     Elizabeth Palau, PT, DPT [phone]    [name] 12/23/2020, 1:32 PM

## 2020-12-24 DIAGNOSIS — Z66 Do not resuscitate: Secondary | ICD-10-CM

## 2020-12-24 DIAGNOSIS — Z515 Encounter for palliative care: Secondary | ICD-10-CM

## 2020-12-24 DIAGNOSIS — Z7189 Other specified counseling: Secondary | ICD-10-CM

## 2020-12-24 LAB — GLUCOSE, CAPILLARY
Glucose-Capillary: 108 mg/dL — ABNORMAL HIGH (ref 70–99)
Glucose-Capillary: 110 mg/dL — ABNORMAL HIGH (ref 70–99)
Glucose-Capillary: 117 mg/dL — ABNORMAL HIGH (ref 70–99)
Glucose-Capillary: 88 mg/dL (ref 70–99)

## 2020-12-24 LAB — CULTURE, BLOOD (ROUTINE X 2)
Culture: NO GROWTH
Culture: NO GROWTH
Special Requests: ADEQUATE

## 2020-12-24 LAB — MAGNESIUM: Magnesium: 1.6 mg/dL — ABNORMAL LOW (ref 1.7–2.4)

## 2020-12-24 MED ORDER — MAGNESIUM SULFATE 2 GM/50ML IV SOLN
2.0000 g | Freq: Once | INTRAVENOUS | Status: AC
  Administered 2020-12-24: 2 g via INTRAVENOUS
  Filled 2020-12-24: qty 50

## 2020-12-24 MED ORDER — SALINE SPRAY 0.65 % NA SOLN
1.0000 | NASAL | Status: DC | PRN
  Administered 2020-12-24: 1 via NASAL
  Filled 2020-12-24: qty 44

## 2020-12-24 NOTE — Care Management (Signed)
Dementia is Primary and Supersedes MI

## 2020-12-24 NOTE — TOC Progression Note (Signed)
Transition of Care [facility]) - Progression Note    Patient Details  Name: [name] MRN: [id number] Date of Birth: 08/15/1941  [facility] CM/SW Contact  Chapman Fitch, RN Phone Number: 12/24/2020, 9:05 AM  Clinical Narrative:     Clinical updated to [facility] for level 2 PASRR        Expected Discharge Plan and Services                                                 Social Determinants of Health (SDOH) Interventions    Readmission Risk Interventions No flowsheet data found.

## 2020-12-24 NOTE — Progress Notes (Signed)
PROGRESS NOTE    [name]  ZOX:[id number] DOB: 06/13/1941 DOA: 12/19/2020 PCP: Patient, No Pcp Per (Inactive)    Brief Narrative:  80 year old female from assisted living facility with history of bipolar disorder, dementia, insomnia, peripheral arterial disease, GERD and chronic pain syndrome who was recently admitted to the hospital brought back to the emergency room with shortness of breath found at her assisted living place.  Reportedly patient was on amoxicillin for UTI.  She was recently admitted to the hospital 4/1-4/5 for similar symptoms and treated for pneumonia.  She was discharged on 2 L oxygen.  Patient poor historian.  In the emergency room, on 2 L oxygen.  Potassium 3.3.  Otherwise electrolytes normal.  Chest x-ray with a small left sided effusion and left basilar opacity more prominent than a week ago.  Increasing right patchy opacities.  Was given vancomycin cefepime in the ER due to recent hospitalization and admitted. Patient remains in the hospital with not eating, hypoglycemic without IV dextrose. She has a Field seismologiststate appointed guardian, Child psychotherapistsocial worker.   4/13- more cooperative on exam today. Willing to eat her breakfast which I setup infront of her this am. Denies any complaints.    Assessment & Plan:   Principal Problem:   Acute respiratory failure with hypoxia (HCC) Active Problems:   Bipolar disorder (HCC)   Dementia with behavioral disturbance (HCC)   HCAP (healthcare-associated pneumonia)   PAD (peripheral artery disease) (HCC)   HLD (hyperlipidemia)   Insomnia   GERD (gastroesophageal reflux disease)  Pneumonia, present on admission.  Suspected.  Ruled out.  Less likely.  Recently completely treated.  Will discontinue all antibiotics. Procalcitonin was normal. Seen by speech on 4/2, no evidence of aspiration. 2D echocardiogram with normal ejection fraction. We will try incentive spirometry, mobility, breathing exercises and bronchodilator therapies.   4/13  -PT OT recommend SNF  Keep O2 sat above 92%  IS   Hypokalemia: Full exam remained stable    Severe hypomagnesemia:  Was replaced  We will check level today    Hypoglycemia: Due to poor oral intake.   Maintain on dextrose  Not much appetite unless we encourage  Did encourage her to increase her p.o. intake  Her hypoglycemia has improved based on BG's level  Need to monitor as she has poor p.o. intake   Dementia/bipolar disorder and insomnia: On multiple medications including lamotrigine, olanzapine, trazodone, mirtazapine, Depakote, rivastigmine and duloxetine that we will continue.  Hypoglycemia/recurrent hospitalization/severe electrolyte abnormalities due to poor oral intake and adult failure to thrive: Looks like patient has recently worsening clinical condition.   palliative following , see note   Please see note from palliative care    DVT prophylaxis: lovenox  Code Status: DNR Family Communication:Disposition Plan: Status is: Inpatient.  Dispo: The patient is from: ALF              Anticipated d/c is to: Unknown.  Skilled nursing facility vs hospice.              Patient currently is not medically stable to d/c.   Difficult to place patient No     Consultants:   Palliative  Procedures:   None  Antimicrobials:  Anti-infectives (From admission, onward)   Start     Dose/Rate Route Frequency Ordered Stop   12/21/20 2100  azithromycin (ZITHROMAX) tablet 500 mg  Status:  Discontinued        500 mg Oral Daily 12/21/20 0812 12/22/20 1051   12/20/20 1900  cefTRIAXone (  ROCEPHIN) 2 g in sodium chloride 0.9 % 100 mL IVPB  Status:  Discontinued        2 g 200 mL/hr over 30 Minutes Intravenous Every 24 hours 12/19/20 2058 12/22/20 1051   12/19/20 2100  azithromycin (ZITHROMAX) 500 mg in sodium chloride 0.9 % 250 mL IVPB  Status:  Discontinued        500 mg 250 mL/hr over 60 Minutes Intravenous Every 24 hours 12/19/20 2058 12/21/20 0812   12/19/20 1915   vancomycin (VANCOCIN) IVPB 1000 mg/200 mL premix        1,000 mg 200 mL/hr over 60 Minutes Intravenous  Once 12/19/20 1906 12/19/20 2100   12/19/20 1915  ceFEPIme (MAXIPIME) 2 g in sodium chloride 0.9 % 100 mL IVPB        2 g 200 mL/hr over 30 Minutes Intravenous  Once 12/19/20 1906 12/19/20 2021         Subjective: More cooperative. Has no complaints this am. Promised to eat breakfast for me.   Objective: Vitals:   12/23/20 2000 12/23/20 2057 12/24/20 0404 12/24/20 0741  BP:  (!) 107/50 (!) 102/51 (!) 104/45  Pulse:  73 71 78  Resp:  18 16 18   Temp:  98.2 F (36.8 C) 98.2 F (36.8 C) (!) 97.5 F (36.4 C)  TempSrc:  Oral  Oral  SpO2: 99% 98% 98% 96%  Weight:      Height:        Intake/Output Summary (Last 24 hours) at 12/24/2020 0824 Last data filed at 12/23/2020 1700 Gross per 24 hour  Intake 477 ml  Output --  Net 477 ml   Filed Weights   12/19/20 2229 12/21/20 0428 12/23/20 0500  Weight: 55.3 kg 81.4 kg 56.3 kg    Examination: NAD, sitting up in bed CTA no wheeze rales rhonchi's Regular S1-S2 no gallops Soft benign positive bowel sounds No edema Grossly intact Mood and affect appropriate in current setting    Data Reviewed: I have personally reviewed following labs and imaging studies  CBC: Recent Labs  Lab 12/19/20 1711 12/20/20 0532 12/21/20 0414 12/22/20 0352  WBC 11.2* 7.9 6.9 7.9  NEUTROABS 7.7  --  3.4 4.2  HGB 11.3* 9.5* 10.9* 10.6*  HCT 35.4* 29.7* 33.1* 33.3*  MCV 90.1 91.1 90.9 92.0  PLT 667* 531* 580* 571*   Basic Metabolic Panel: Recent Labs  Lab 12/19/20 1711 12/20/20 0532 12/21/20 0414 12/22/20 0352  NA 142 140 142 142  K 3.3* 3.8 4.1 4.2  CL 106 110 110 108  CO2 26 23 26 27   GLUCOSE 90 74 95 99  BUN 18 13 8 13   CREATININE 0.58 0.63 0.48 0.46  CALCIUM 9.2 8.5* 8.8* 8.8*  MG 1.1*  --  1.9 1.5*  PHOS  --   --  2.9 2.9   GFR: Estimated Creatinine Clearance: 49.8 mL/min (by C-G formula based on SCr of 0.46  mg/dL). Liver Function Tests: Recent Labs  Lab 12/20/20 0532 12/22/20 0352  AST 18 15  ALT 15 13  ALKPHOS 49 54  BILITOT 0.7 0.2*  PROT 4.9* 5.2*  ALBUMIN 2.3* 2.4*   No results for input(s): LIPASE, AMYLASE in the last 168 hours. No results for input(s): AMMONIA in the last 168 hours. Coagulation Profile: No results for input(s): INR, PROTIME in the last 168 hours. Cardiac Enzymes: No results for input(s): CKTOTAL, CKMB, CKMBINDEX, TROPONINI in the last 168 hours. BNP (last 3 results) No results for input(s): PROBNP in the last  8760 hours. HbA1C: No results for input(s): HGBA1C in the last 72 hours. CBG: Recent Labs  Lab 12/23/20 0241 12/23/20 0558 12/23/20 1154 12/24/20 0017 12/24/20 0616  GLUCAP 91 87 97 108* 88   Lipid Profile: No results for input(s): CHOL, HDL, LDLCALC, TRIG, CHOLHDL, LDLDIRECT in the last 72 hours. Thyroid Function Tests: No results for input(s): TSH, T4TOTAL, FREET4, T3FREE, THYROIDAB in the last 72 hours. Anemia Panel: No results for input(s): VITAMINB12, FOLATE, FERRITIN, TIBC, IRON, RETICCTPCT in the last 72 hours. Sepsis Labs: Recent Labs  Lab 12/19/20 1711 12/20/20 0532 12/21/20 0414  PROCALCITON <0.10 <0.10 <0.10    Recent Results (from the past 240 hour(s))  MRSA PCR Screening     Status: None   Collection Time: 12/15/20  6:00 AM   Specimen: Nasopharyngeal  Result Value Ref Range Status   MRSA by PCR NEGATIVE NEGATIVE Final    Comment:        The GeneXpert MRSA Assay (FDA approved for NASAL specimens only), is one component of a comprehensive MRSA colonization surveillance program. It is not intended to diagnose MRSA infection nor to guide or monitor treatment for MRSA infections. Performed at [facility], [address] [place], [place] [address]   Resp Panel by RT-PCR (Flu A&B, Covid) Nasopharyngeal Swab     Status: None   Collection Time: 12/19/20  5:50 PM   Specimen: Nasopharyngeal Swab;  Nasopharyngeal(NP) swabs in vial transport medium  Result Value Ref Range Status   SARS Coronavirus 2 by RT PCR NEGATIVE NEGATIVE Final    Comment: (NOTE) SARS-CoV-2 target nucleic acids are NOT DETECTED.  The SARS-CoV-2 RNA is generally detectable in upper respiratory specimens during the acute phase of infection. The lowest concentration of SARS-CoV-2 viral copies this assay can detect is 138 copies/mL. A negative result does not preclude SARS-Cov-2 infection and should not be used as the sole basis for treatment or other patient management decisions. A negative result may occur with  improper specimen collection/handling, submission of specimen other than nasopharyngeal swab, presence of viral mutation(s) within the areas targeted by this assay, and inadequate number of viral copies(<138 copies/mL). A negative result must be combined with clinical observations, patient history, and epidemiological information. The expected result is Negative.  Fact Sheet for Patients:  BloggerCourse.com  Fact Sheet for Healthcare Providers:  SeriousBroker.it  This test is no t yet approved or cleared by the [place] FDA and  has been authorized for detection and/or diagnosis of SARS-CoV-2 by FDA under an Emergency Use Authorization (EUA). This EUA will remain  in effect (meaning this test can be used) for the duration of the COVID-19 declaration under Section 564(b)(1) of the Act, 21 U.S.C.section 360bbb-3(b)(1), unless the authorization is terminated  or revoked sooner.       Influenza A by PCR NEGATIVE NEGATIVE Final   Influenza B by PCR NEGATIVE NEGATIVE Final    Comment: (NOTE) The Xpert Xpress SARS-CoV-2/FLU/RSV plus assay is intended as an aid in the diagnosis of influenza from Nasopharyngeal swab specimens and should not be used as a sole basis for treatment. Nasal washings and aspirates are unacceptable for Xpert Xpress  SARS-CoV-2/FLU/RSV testing.  Fact Sheet for Patients: BloggerCourse.com  Fact Sheet for Healthcare Providers: SeriousBroker.it  This test is not yet approved or cleared by the [place] FDA and has been authorized for detection and/or diagnosis of SARS-CoV-2 by FDA under an Emergency Use Authorization (EUA). This EUA will remain in effect (meaning this test can be used)  for the duration of the COVID-19 declaration under Section 564(b)(1) of the Act, 21 U.S.C. section 360bbb-3(b)(1), unless the authorization is terminated or revoked.  Performed at [facility], [address] [place], [place] [address]   Blood culture (routine x 2)     Status: None   Collection Time: 12/19/20  7:20 PM   Specimen: BLOOD  Result Value Ref Range Status   Specimen Description BLOOD  RIGHT FOREARM   Final   Special Requests   Final    BOTTLES DRAWN AEROBIC AND ANAEROBIC Blood Culture adequate volume   Culture   Final    NO GROWTH 5 DAYS Performed at [facility], [address] [place], [place] [address]    Report Status 12/24/2020 FINAL  Final  Blood culture (routine x 2)     Status: None   Collection Time: 12/19/20  7:30 PM   Specimen: BLOOD  Result Value Ref Range Status   Specimen Description BLOOD  LEFT FOREARM  Final   Special Requests   Final    BOTTLES DRAWN AEROBIC AND ANAEROBIC Blood Culture results may not be optimal due to an inadequate volume of blood received in culture bottles   Culture   Final    NO GROWTH 5 DAYS Performed at [facility], [address] [place], [place] [address]    Report Status 12/24/2020 FINAL  Final  MRSA PCR Screening     Status: None   Collection Time: 12/20/20  6:09 AM   Specimen: Nasal Mucosa; Nasopharyngeal  Result Value Ref Range Status   MRSA by PCR NEGATIVE NEGATIVE Final    Comment:        The GeneXpert MRSA Assay (FDA approved for NASAL specimens only), is  one component of a comprehensive MRSA colonization surveillance program. It is not intended to diagnose MRSA infection nor to guide or monitor treatment for MRSA infections. Performed at [facility], [address] [place], [place] [address]   Culture, Urine     Status: None   Collection Time: 12/20/20  7:52 AM   Specimen: Urine, Random  Result Value Ref Range Status   Specimen Description   Final    URINE, RANDOM Performed at [facility], [address] [place], [place] [address]    Special Requests   Final    NONE Performed at [facility], [address] [place], [place] [address]    Culture   Final    NO GROWTH Performed at [facility], [address], [place], [place] [address]    Report Status 12/21/2020 FINAL  Final         Radiology Studies: No results found.      Scheduled Meds: . aspirin  81 mg Oral Daily  . divalproex  125 mg Oral BID  . DULoxetine  60 mg Oral Daily  . enoxaparin (LOVENOX) injection  40 mg Subcutaneous Q24H  . feeding supplement  237 mL Oral BID BM  . lactulose  30 g Oral Daily  . lamoTRIgine  25 mg Oral Daily  . magnesium oxide  400 mg Oral BID  . mouth rinse  15 mL Mouth Rinse BID  . mirtazapine  30 mg Oral Daily  . multivitamin with minerals  1 tablet Oral Daily  . OLANZapine  5 mg Oral QHS  . pantoprazole  40 mg Oral Daily  . rivastigmine  6 mg Oral BID  . rosuvastatin  10 mg Oral Daily  . senna-docusate  2 tablet Oral BID  . sodium chloride flush  3 mL Intravenous Q12H  . traZODone  50 mg Oral QHS   Continuous Infusions: . sodium chloride Stopped (12/21/20 0432)     LOS: 4 days    Time spent: 32 minutes    Lynn Ito, MD [facility] Pager [phone]

## 2020-12-24 NOTE — TOC Progression Note (Signed)
Transition of Care [facility]) - Progression Note    Patient Details  Name: [name] MRN: [id number] Date of Birth: March 14, 1941  Transition [facility] Care [facility]) CM/SW Contact  Chapman Fitch, RN Phone Number: 12/24/2020, 9:37 AM  Clinical Narrative:     30 day note sent for MD to sign.  MD notified        Expected Discharge Plan and Services                                                 Social Determinants of Health (SDOH) Interventions    Readmission Risk Interventions No flowsheet data found.

## 2020-12-24 NOTE — TOC Progression Note (Signed)
Transition of Care [facility]) - Progression Note    Patient Details  Name: [name] MRN: [id number] Date of Birth: July 22, 1941  Transition of Care [facility]) CM/SW Contact  Chapman Fitch, RN Phone Number: 12/24/2020, 9:46 AM  Clinical Narrative:      Notified by Babette Relic at [facility] that they are currently unable to access the HUB.   Referral manually faxed       Expected Discharge Plan and Services                                                 Social Determinants of Health (SDOH) Interventions    Readmission Risk Interventions No flowsheet data found.

## 2020-12-24 NOTE — TOC Progression Note (Signed)
Transition of Care [facility]) - Progression Note    Patient Details  Name: FRADEL BALDONADO MRN: [id number] Date of Birth: 05-Jun-1941  Transition of Care [facility]) CM/SW Contact  Chapman Fitch, RN Phone Number: 12/24/2020, 3:13 PM  Clinical Narrative:      All updated clinical has been submitted to [facility]  Per Tammy at [facility] they can offer a bed for SNF. Copayes of $194.50 start on day 21 Voicemail let for DSS to present this offer       Expected Discharge Plan and Services                                                 Social Determinants of Health (SDOH) Interventions    Readmission Risk Interventions No flowsheet data found.

## 2020-12-24 NOTE — Progress Notes (Signed)
Occupational Therapy Treatment Patient Details Name: NAQUITA NAPPIER MRN: [id number] DOB: 12-03-1940 Today's Date: 12/24/2020    History of present illness 80 year old female from assisted living facility with history of bipolar disorder, dementia, insomnia, peripheral arterial disease, GERD and chronic pain syndrome who was recently admitted to the hospital brought back to the emergency room with shortness of breath found at her assisted living place. Found to have acute respiratory failure with hypoxia, pneumonia,   OT comments  Ms Cameron was seen for OT treatment on this date. Upon arrival to room pt beginning bed level toileting with nursing student. Pt required MIN A rolling at bed level for toileting, MAX A for perihygiene. MAX A sup<>sit, pt tolerated ~10 mins sitting EOB for grooming tasks. SBA + MIN VCs for seated grooming tasks - cues for thoroughness and initiation.  MAX A don B socks seated EOB. Pt gave one-word responses to questions. Intially pt participatory however following ~8 mins sitting EOB pt requires increased cues for participation in grooming. At end of session family member entered room, updated on therapy progress. Pt making good progress toward goals. Pt continues to benefit from skilled OT services to maximize return to PLOF and minimize risk of future falls, injury, caregiver burden, and readmission. Will continue to follow POC. Discharge recommendation remains appropriate.    Follow Up Recommendations  SNF    Equipment Recommendations  Other (comment) (defer to next venue of care)    Recommendations for Other Services      Precautions / Restrictions Precautions Precautions: Fall Restrictions Weight Bearing Restrictions: No       Mobility Bed Mobility Overal bed mobility: Needs Assistance Bed Mobility: Rolling;Supine to Sit;Sit to Supine Rolling: Min assist   Supine to sit: Max assist Sit to supine: Max assist   General bed mobility comments: rigid  BLEs, prefers rolling to sidelyine then sitting EOB    Transfers Overall transfer level: Needs assistance   Transfers: Lateral/Scoot Transfers          Lateral/Scoot Transfers: Max assist      Balance Overall balance assessment: Needs assistance Sitting-balance support: Feet supported;No upper extremity supported Sitting balance-Leahy Scale: Good     Standing balance support: Bilateral upper extremity supported Standing balance-Leahy Scale: Poor                             ADL either performed or assessed with clinical judgement   ADL Overall ADL's : Needs assistance/impaired                                       General ADL Comments: SBA + MIN VCs for seated grooming tasks - cues for thoroughness and initiation. MIN A rolling at bed level for toileitn, MAX A for perihygiene. MAX A don B socks seated EOB               Cognition Arousal/Alertness: Awake/alert Behavior During Therapy: Flat affect Overall Cognitive Status: History of cognitive impairments - at baseline                                 General Comments: one-word responses to questions. Intially participatory however following ~8 mins sitting EOB pt requires increased cues for participation in grooming        Exercises Exercises: Other  exercises Other Exercises Other Exercises: Pt educated re: OT role, DME recs, d/c recs, falls prevention, ECS, IS frequency and use Other Exercises: LBD, toileting, grooming, sup<>sit, sit<>stand, sitting/standing balance/tolerance, lateral scoot t/f, IS      General Comments SpO2 mid 90s t/o on 1L [place]    Pertinent Vitals/ Pain       Pain Assessment: No/denies pain         Frequency  Min 1X/week        Progress Toward Goals  OT Goals(current goals can now be found in the care plan section)  Progress towards OT goals: Progressing toward goals  Acute Rehab OT Goals Patient Stated Goal: to improve breathing OT  Goal Formulation: With patient Time For Goal Achievement: 01/04/21 Potential to Achieve Goals: Good ADL Goals Pt Will Perform Grooming: with modified independence;sitting Pt Will Perform Upper Body Dressing: with modified independence;sitting Pt Will Perform Lower Body Dressing: with modified independence;sitting/lateral leans  Plan Discharge plan remains appropriate;Frequency remains appropriate       AM-PAC OT "6 Clicks" Daily Activity     Outcome Measure   Help from another person eating meals?: None Help from another person taking care of personal grooming?: A Little Help from another person toileting, which includes using toliet, bedpan, or urinal?: A Lot Help from another person bathing (including washing, rinsing, drying)?: A Lot Help from another person to put on and taking off regular upper body clothing?: A Little Help from another person to put on and taking off regular lower body clothing?: A Lot 6 Click Score: 16    End of Session    OT Visit Diagnosis: Muscle weakness (generalized) (M62.81)   Activity Tolerance Patient tolerated treatment well   Patient Left in bed;with call bell/phone within reach;with bed alarm set;with family/visitor present   Nurse Communication          Time: 6834-1962 OT Time Calculation (min): 27 min  Charges: OT General Charges $OT Visit: 1 Visit OT Treatments $Self Care/Home Management : 23-37 mins  Kathie Dike, M.S. OTR/L  12/24/20, 3:53 PM  ascom [phone]

## 2020-12-24 NOTE — Care Management (Signed)
GO:TLXBW [place] Date of Birth:05/19/1941 Date: 12/24/20   To Whom It May Concern:  Please be advised that the above-named patient will require a short-term nursing home stay - anticipated 30 days or less for rehabilitation and strengthening.  The plan is for return home.

## 2020-12-24 NOTE — TOC Progression Note (Signed)
Transition of Care [facility]) - Progression Note    Patient Details  Name: CONSUELA WIDENER MRN: [id number] Date of Birth: 06-12-1941  [facility] of [facility]) CM/SW Contact  Chapman Fitch, RN Phone Number: 12/24/2020, 9:15 AM  Clinical Narrative:      Spoke with guardian Pietro Cassis  Presented bed offer at [facility]. She would like me to check with [facility] to see if they can offer. Called [facility] and [facility] and requested she review the case.  Per DSS they did not receive an update yesterday from MD.  MD notified       Expected Discharge Plan and Services                                                 Social Determinants of Health (SDOH) Interventions    Readmission Risk Interventions No flowsheet data found.

## 2020-12-24 NOTE — Consult Note (Signed)
Consultation Note Date: 12/24/2020   Patient Name: [name]  DOB: Jul 04, 1941  MRN: [id number]  Age / Sex: 80 y.o., female   PCP: Patient, No Pcp Per (Inactive) Referring Physician: Lynn Ito, MD   REASON FOR CONSULTATION:Establishing goals of care  Palliative Care consult requested for goals of care discussion in this 80 y.o. female with a medical history significant for dementia, insomnia, PAD, bipolar, hyperlipidemia, GERD, and chronic pain. She presented to ED from [facility] via EMS with complaints of shortness of breath. Patient is a ward of state with [facility] DSS worker. Of note patient recently discharged 3 days prior after receiving treatment for pneumonia. Since admission patient continues to have poor oral intake resulting in hypoglycemic episodes.   Clinical Assessment and Goals of Care: I have reviewed medical records including lab results, imaging, Epic notes, and MAR, received report from the bedside RN, and assessed the patient.   I spoke with patient's DSS Guardian, [name] to discuss diagnosis prognosis, GOC, EOL wishes, disposition and options.  I introduced Palliative Medicine as specialized medical care for people living with serious illness. It focuses on providing relief from the symptoms and stress of a serious illness. The goal is to improve quality of life for both the patient and the family. She verbalized understanding.   We discussed a brief life review of the patient. Guardian states patient has been a resident at [facility] for several years. She has been under the guardianship of DSS since about 2017.   We discussed Her current illness and what it means in the larger context of Her on-going co-morbidities. Natural disease trajectory and expectations at EOL were discussed. Detailed education on dementia, pneumonia, and concerns for patient's poor nutrition.   A detailed discussion was had today regarding advanced  directives.  Concepts specific to code status, artifical feeding and hydration, continued IV antibiotics and rehospitalization. Education provided with recommendations to complete MOST form with provided rationale. Amber states DSS policy has recently changed and they will only consider completion of MOST form or any other medical changes if they receive written notarized documentation explaining request and rationale of request and signed by the actual physician.   Values and goals of care important to patient were attempted to be elicited.   Goals expressed by guardian is to continue with current treatments with plans of discharging patient to SNF (specified preference for [facility]). They will continue to evaluate patient's condition on a daily basis and make any further decisions as needed.    I discussed the importance of continued conversation with medical providers regarding overall plan of care and treatment options, ensuring decisions are within the context of the patients values and GOCs.   Questions and concerns were addressed.  Legal Guardian was encouraged to call with questions or concerns.  PMT will continue to support holistically as needed.   CODE STATUS: DNR  ADVANCE DIRECTIVES: Primary Decision Maker: DSS Legal Guardian    SYMPTOM MANAGEMENT: per attending   Palliative Prophylaxis:   Aspiration, Bowel Regimen, Delirium Protocol, Eye Care, Frequent Pain Assessment, Oral Care, Palliative Wound Care and Turn Reposition  PSYCHO-SOCIAL/SPIRITUAL:  Support System: Family  Desire for further Chaplaincy support:no  Additional Recommendations (Limitations, Scope, Preferences):  Full Scope Treatment  Education on hospice/palliative    PAST MEDICAL HISTORY: Past Medical History:  Diagnosis Date  . Alzheimer's dementia (HCC)   . Insomnia   . Low back pain   . Vomiting  ALLERGIES:  has No Known Allergies.   MEDICATIONS:  Current Facility-Administered Medications   Medication Dose Route Frequency Provider Last Rate Last Admin  . 0.9 %  sodium chloride infusion   Intravenous PRN Dorcas Carrow, MD   Stopped at 12/21/20 0432  . acetaminophen (TYLENOL) tablet 650 mg  650 mg Oral Q6H PRN Synetta Fail, MD   650 mg at 12/19/20 2347   Or  . acetaminophen (TYLENOL) suppository 650 mg  650 mg Rectal Q6H PRN Synetta Fail, MD      . alum & mag hydroxide-simeth (MAALOX/MYLANTA) 200-200-20 MG/5ML suspension 30 mL  30 mL Oral QID PRN Synetta Fail, MD      . aspirin chewable tablet 81 mg  81 mg Oral Daily Synetta Fail, MD   81 mg at 12/24/20 0948  . camphor-menthol (SARNA) lotion 1 application  1 application Topical QID PRN Synetta Fail, MD      . divalproex (DEPAKOTE) DR tablet 125 mg  125 mg Oral BID Synetta Fail, MD   125 mg at 12/24/20 0948  . DULoxetine (CYMBALTA) DR capsule 60 mg  60 mg Oral Daily Synetta Fail, MD   60 mg at 12/24/20 0948  . enoxaparin (LOVENOX) injection 40 mg  40 mg Subcutaneous Q24H Synetta Fail, MD   40 mg at 12/23/20 2205  . feeding supplement (ENSURE ENLIVE / ENSURE PLUS) liquid 237 mL  237 mL Oral BID BM Synetta Fail, MD   237 mL at 12/24/20 1002  . ipratropium-albuterol (DUONEB) 0.5-2.5 (3) MG/3ML nebulizer solution 3 mL  3 mL Nebulization Q6H PRN Synetta Fail, MD   3 mL at 12/21/20 0425  . lactulose (CHRONULAC) 10 GM/15ML solution 30 g  30 g Oral Daily Synetta Fail, MD   30 g at 12/24/20 1048  . lamoTRIgine (LAMICTAL) tablet 25 mg  25 mg Oral Daily Synetta Fail, MD   25 mg at 12/24/20 0949  . magnesium hydroxide (MILK OF MAGNESIA) suspension 30 mL  30 mL Oral QHS PRN Synetta Fail, MD      . magnesium oxide (MAG-OX) tablet 400 mg  400 mg Oral BID Dorcas Carrow, MD   400 mg at 12/24/20 0949  . MEDLINE mouth rinse  15 mL Mouth Rinse BID Synetta Fail, MD   15 mL at 12/24/20 0959  . mirtazapine (REMERON) tablet 30 mg  30 mg Oral Daily Synetta Fail, MD   30 mg at 12/24/20 0949  . multivitamin with minerals tablet 1 tablet  1 tablet Oral Daily Synetta Fail, MD   1 tablet at 12/24/20 [phone]  . OLANZapine (ZYPREXA) tablet 5 mg  5 mg Oral QHS Synetta Fail, MD   5 mg at 12/23/20 2201  . ondansetron (ZOFRAN-ODT) disintegrating tablet 4 mg  4 mg Oral Q6H PRN Synetta Fail, MD      . pantoprazole (PROTONIX) EC tablet 40 mg  40 mg Oral Daily Synetta Fail, MD   40 mg at 12/24/20 0950  . polyethylene glycol (MIRALAX / GLYCOLAX) packet 17 g  17 g Oral Daily PRN Synetta Fail, MD      . rivastigmine (EXELON) capsule 6 mg  6 mg Oral BID Synetta Fail, MD   6 mg at 12/24/20 0948  . rosuvastatin (CRESTOR) tablet 10 mg  10 mg Oral Daily Synetta Fail, MD   10 mg at 12/24/20 0950  . senna-docusate (Senokot-S) tablet  2 tablet  2 tablet Oral BID Synetta FailMelvin, Alexander B, MD   2 tablet at 12/24/20 1048  . sodium chloride (OCEAN) 0.65 % nasal spray 1 spray  1 spray Each Nare PRN Lynn ItoAmery, Sahar, MD      . sodium chloride flush (NS) 0.9 % injection 3 mL  3 mL Intravenous Q12H Synetta FailMelvin, Alexander B, MD   3 mL at 12/24/20 1001  . traZODone (DESYREL) tablet 50 mg  50 mg Oral QHS Synetta FailMelvin, Alexander B, MD   50 mg at 12/23/20 2201  . zolpidem (AMBIEN) tablet 5 mg  5 mg Oral QHS PRN Dorcas CarrowGhimire, Kuber, MD        VITAL SIGNS: BP (!) 102/57 (BP Location: Left Arm)   Pulse 81   Temp 97.9 F (36.6 C) (Oral)   Resp 16   Ht 5\' 7"  (1.702 m)   Wt 56.3 kg   SpO2 97%   BMI 19.44 kg/m  Filed Weights   12/19/20 2229 12/21/20 0428 12/23/20 0500  Weight: 55.3 kg 81.4 kg 56.3 kg    Estimated body mass index is 19.44 kg/m as calculated from the following:   Height as of this encounter: 5\' 7"  (1.702 m).   Weight as of this encounter: 56.3 kg.  LABS: CBC:    Component Value Date/Time   WBC 7.9 12/22/2020 0352   HGB 10.6 (L) 12/22/2020 0352   HGB 14.5 10/22/2012 0051   HCT 33.3 (L) 12/22/2020 0352   HCT 44.4 10/22/2012 0051    PLT 571 (H) 12/22/2020 0352   PLT 255 10/22/2012 0051   Comprehensive Metabolic Panel:    Component Value Date/Time   NA 142 12/22/2020 0352   NA 143 10/22/2012 0051   K 4.2 12/22/2020 0352   K 3.7 10/22/2012 0051   BUN 13 12/22/2020 0352   BUN 33 (H) 10/22/2012 0051   CREATININE 0.46 12/22/2020 0352   CREATININE 0.66 10/22/2012 0051   ALBUMIN 2.4 (L) 12/22/2020 0352   ALBUMIN 4.0 10/22/2012 0051     Review of Systems  Unable to perform ROS: Dementia   Physical Exam General: NAD Cardiovascular: regular rate and rhythm Pulmonary: clear ant fields, diminished bilaterally  Abdomen: soft, nontender, + bowel sounds Neurological: mood appropriate, will follow commands   Prognosis: Guarded   Discharge Planning:  Skilled Nursing Facility for rehab with Palliative care service follow-up  Recommendations: . DNR/DNI-guardian states she is researching paperwork to confirm status  . Continue with current plan of care, per guardian clear in expressed goals to continue to treat the treatable, take care day by day and make medical decisions as needed, hopeful for discharge to [facility] facility to continue care. Detailed discussions and recommendation on completion of MOST considering patient's poor oral nutrition, pneumonia, dementia. Advised notarized document must be received by physician for consideration of completion of such document. This has not been the case in the past as NPs specifically Palliative have been able to complete and submit for final completion with Legal Guardians in the past.  . PMT will continue to support and follow as needed. Please call team line with urgent needs.   Palliative Performance Scale: PPS 30%              Guardian expressed understanding and was in agreement with this plan.   Thank you for allowing the Palliative Medicine Team to assist in the care of this patient. Please utilize secure chat with additional questions, if there is no response within 30  minutes please call  the above phone number.   Time In: 1400 Time Out: 1450 Time Total: 50 min.   Visit consisted of counseling and education dealing with the complex and emotionally intense issues of symptom management and palliative care in the setting of serious and potentially life-threatening illness.Greater than 50%  of this time was spent counseling and coordinating care related to the above assessment and plan.  Signed by:  Willette Alma, AGPCNP-BC Palliative Medicine Team  Phone: [phone] Pager: [phone] Amion: Thea Alken   Palliative Medicine Team providers are available by phone from 7am to 7pm daily and can be reached through the team cell phone.  Should this patient require assistance outside of these hours, please call the patient's attending physician.

## 2020-12-24 NOTE — Progress Notes (Signed)
Patient had friend Talbert Forest to visit today. Talbert Forest stated she used to work with patient and has been friends with Miss Younts. Shirley's contact info in chart.    Madie Reno, RN

## 2020-12-25 LAB — BASIC METABOLIC PANEL
Anion gap: 6 (ref 5–15)
BUN: 30 mg/dL — ABNORMAL HIGH (ref 8–23)
CO2: 30 mmol/L (ref 22–32)
Calcium: 9.2 mg/dL (ref 8.9–10.3)
Chloride: 104 mmol/L (ref 98–111)
Creatinine, Ser: 0.64 mg/dL (ref 0.44–1.00)
GFR, Estimated: >60 mL/min (ref >60)
Glucose, Bld: 92 mg/dL (ref 70–99)
Potassium: 4.2 mmol/L (ref 3.5–5.1)
Sodium: 140 mmol/L (ref 135–145)

## 2020-12-25 LAB — CBC
HCT: 32 % — ABNORMAL LOW (ref 36.0–46.0)
Hemoglobin: 9.9 g/dL — ABNORMAL LOW (ref 12.0–15.0)
MCH: 28.9 pg (ref 26.0–34.0)
MCHC: 30.9 g/dL (ref 30.0–36.0)
MCV: 93.3 fL (ref 80.0–100.0)
Platelets: 441 10*3/uL — ABNORMAL HIGH (ref 150–400)
RBC: 3.43 MIL/uL — ABNORMAL LOW (ref 3.87–5.11)
RDW: 13.3 % (ref 11.5–15.5)
WBC: 6.8 10*3/uL (ref 4.0–10.5)
nRBC: 0 % (ref 0.0–0.2)

## 2020-12-25 LAB — GLUCOSE, CAPILLARY
Glucose-Capillary: 102 mg/dL — ABNORMAL HIGH (ref 70–99)
Glucose-Capillary: 102 mg/dL — ABNORMAL HIGH (ref 70–99)
Glucose-Capillary: 88 mg/dL (ref 70–99)
Glucose-Capillary: 95 mg/dL (ref 70–99)

## 2020-12-25 LAB — RESP PANEL BY RT-PCR (FLU A&B, COVID) ARPGX2
Influenza A by PCR: NEGATIVE
Influenza B by PCR: NEGATIVE
SARS Coronavirus 2 by RT PCR: NEGATIVE

## 2020-12-25 MED ORDER — MAGNESIUM OXIDE 400 (241.3 MG) MG PO TABS: 400.0000 mg | ORAL_TABLET | Freq: Two times a day (BID) | ORAL | Status: DC

## 2020-12-25 NOTE — Progress Notes (Signed)
Patient weaned off from 2L O2 to Room Air. Patient sats is 91-92%. No complication of SOB

## 2020-12-25 NOTE — Discharge Summary (Signed)
[name] OHY:[id number] DOB: 04-15-1941 DOA: 12/19/2020  PCP: Patient, No Pcp Per (Inactive)  Admit date: 12/19/2020 Discharge date: 12/25/2020  Admitted From: [facility] Disposition:  SNF  Recommendations for Outpatient Follow-up:  1. Follow up with PCP in 1 week 2. Please obtain BMP/CBC in one week 3. Palliative care to follow up with patient at SNF 4.Place on aspiration precautions please     Discharge Condition:Stable CODE STATUS:DNR  Diet recommendation: Regular    Brief/Interim Summary: Per HPI: [name] is a 80 y.o. female with medical history significant of bipolar, dementia, insomnia, gait disorder, PAD, hyperlipidemia, GERD, chronic pain who presents with shortness of breath.Of note, she was recently admitted from 4/1-4/5 for similar symptoms and pneumonia.  Discharge summary states she was discharged on 1 L but it was not reported whether or not she had come off of this.  She was treated with ceftriaxone and azithromycin and discharged on amoxicillin.ED Course: Vital signs in the ED significant for initially requiring 4 L to maintain saturations has been weaned down to 1 to 2 L.  Vital signs otherwise stable.  Lab work-up showed BMP with potassium 3.3.  CBC showed leukocytosis to 11.2, hemoglobin of 11.3, platelets 667.  Troponin negative x2.  Respiratory panel flu COVID negative CXR: Small left pleural effusion and basilar opacity, slightly progressed from imaging 1 week ago. Increasing patchy right basilar opacity may be atelectasis or pneumonia. Given distribution, consider Aspiration.  Pneumonia, present on admission.    Ruled out.  Less likely.  Recently completely treated.   Antibiotics were discontinued Procalcitonin was normal. Seen by speech on 4/2, no evidence of aspiration. But with cxr findings may need to place her on aspiration precautions/HOB elevated during meals 2D echocardiogram with normal ejection fraction. Continue to use incentive  spirometry, mobility, breathing exercises  PT OT recommend SNF  On RA today 02 sat 91-92%   Hypokalemia: Replaced and remains stable   Severe hypomagnesemia:  Was replaced   continue to monitor as outpatient Continue with po magnesium   Hypoglycemia: Due to poor oral intake.   Maintained on dextrose  Not much appetite unless we encourage  BG stable. Needs protein supplements     Dementia/bipolar disorder and insomnia: On multiple medications including lamotrigine, olanzapine, trazodone, mirtazapine, Depakote, rivastigmine and duloxetine that we will continue.  Hypoglycemia/recurrent hospitalization/severe electrolyte abnormalities due to poor oral intake and adult failure to thrive: Looks like patient has recently worsening clinical condition.   palliative will need to continue to follow as outpatient.  Discussed patients condition , all questions was answered with her legar guardian [name].   Discharge Diagnoses:  Principal Problem:   Acute respiratory failure with hypoxia (HCC) Active Problems:   Bipolar disorder (HCC)   Dementia with behavioral disturbance (HCC)   HCAP (healthcare-associated pneumonia)   PAD (peripheral artery disease) (HCC)   HLD (hyperlipidemia)   Insomnia   GERD (gastroesophageal reflux disease)    Discharge Instructions  Discharge Instructions    Call MD for:  difficulty breathing, headache or visual disturbances   Complete by: As directed    Call MD for:  temperature >100.4   Complete by: As directed    Diet - low sodium heart healthy   Complete by: As directed    Increase activity slowly   Complete by: As directed      Allergies as of 12/25/2020   No Known Allergies     Medication List    STOP taking these medications  amoxicillin 500 MG capsule Commonly known as: AMOXIL   eszopiclone 2 MG Tabs tablet Commonly known as: LUNESTA   guaifenesin 100 MG/5ML syrup Commonly known as: ROBITUSSIN    HYDROcodone-acetaminophen 5-325 MG tablet Commonly known as: NORCO/VICODIN   loperamide 2 MG capsule Commonly known as: IMODIUM   vortioxetine HBr 20 MG Tabs tablet Commonly known as: TRINTELLIX     TAKE these medications   acetaminophen 500 MG tablet Commonly known as: TYLENOL Take 500 mg by mouth every 4 (four) hours as needed for mild pain or fever.   alum & mag hydroxide-simeth 200-200-20 MG/5ML suspension Commonly known as: MAALOX/MYLANTA Take 30 mLs by mouth 4 (four) times daily as needed for indigestion or heartburn.   aspirin 81 MG chewable tablet Chew by mouth daily.   calcium carbonate 1500 (600 Ca) MG Tabs tablet Commonly known as: OSCAL Take 1 tablet by mouth daily.   cholecalciferol 25 MCG (1000 UNIT) tablet Commonly known as: VITAMIN D3 Take 1,000 Units by mouth daily.   divalproex 125 MG DR tablet Commonly known as: DEPAKOTE Take 125 mg by mouth 2 (two) times daily.   DULoxetine 60 MG capsule Commonly known as: CYMBALTA Take 60 mg by mouth daily.   enoxaparin 40 MG/0.4ML injection Commonly known as: LOVENOX Inject 0.4 mLs (40 mg total) into the skin daily.   feeding supplement Liqd Take 237 mLs by mouth 2 (two) times daily between meals.   lactulose (encephalopathy) 10 GM/15ML Soln Commonly known as: CHRONULAC Take 30 g by mouth daily.   lamoTRIgine 25 MG tablet Commonly known as: LAMICTAL Take 1 tablet by mouth daily.   magnesium hydroxide 400 MG/5ML suspension Commonly known as: MILK OF MAGNESIA Take 30 mLs by mouth at bedtime as needed for mild constipation or moderate constipation.   magnesium oxide 400 (241.3 Mg) MG tablet Commonly known as: MAG-OX Take 1 tablet (400 mg total) by mouth 2 (two) times daily.   Menthol-Zinc Oxide 0.44-20.625 % Oint Apply 1 application topically 4 (four) times daily as needed (skin irritation).   mirtazapine 30 MG tablet Commonly known as: REMERON Take 30 mg by mouth daily.   Multivitamin Adult  (Minerals) Tabs Take 1 tablet by mouth daily.   neomycin-bacitracin-polymyxin ointment Commonly known as: NEOSPORIN Apply 1 application topically 4 (four) times daily as needed for wound care.   OLANZapine 5 MG tablet Commonly known as: ZYPREXA Take 5 mg by mouth at bedtime.   omeprazole 20 MG capsule Commonly known as: PRILOSEC Take 20 mg by mouth daily before breakfast.   ondansetron 4 MG disintegrating tablet Commonly known as: ZOFRAN-ODT Take 4 mg by mouth every 6 (six) hours as needed for nausea or vomiting.   polyethylene glycol 17 g packet Commonly known as: MIRALAX / GLYCOLAX Take 17 g by mouth at bedtime.   rivastigmine 6 MG capsule Commonly known as: EXELON Take 6 mg by mouth 2 (two) times daily.   rosuvastatin 10 MG tablet Commonly known as: CRESTOR Take 10 mg by mouth daily.   senna-docusate 8.6-50 MG tablet Commonly known as: Senokot-S Take 2 tablets by mouth 2 (two) times daily.   traZODone 50 MG tablet Commonly known as: DESYREL Take 50 mg by mouth at bedtime.       Contact information for after-discharge care    Destination    [facility] Preferred SNF .   Service: Skilled Paramedic information: [address] [place] [address] [phone]  No Known Allergies  Consultations:  palliative   Procedures/Studies: CT Angio Chest PE W and/or Wo Contrast  Result Date: 12/12/2020 CLINICAL DATA:  Chest pain. EXAM: CT ANGIOGRAPHY CHEST WITH CONTRAST TECHNIQUE: Multidetector CT imaging of the chest was performed using the standard protocol during bolus administration of intravenous contrast. Multiplanar CT image reconstructions and MIPs were obtained to evaluate the vascular anatomy. CONTRAST:  75mL OMNIPAQUE IOHEXOL 350 MG/ML SOLN COMPARISON:  None. FINDINGS: Cardiovascular: Satisfactory opacification of the pulmonary arteries to the segmental level. No evidence of pulmonary embolism.  Normal heart size. No pericardial effusion. Atherosclerosis of thoracic aorta is noted without aneurysm or dissection. Mediastinum/Nodes: Thyroid gland appears normal. No adenopathy is noted. Small sliding-type hiatal hernia is noted. Lungs/Pleura: No pneumothorax is noted. Small left pleural effusion is noted with adjacent subsegmental atelectasis. Mild subsegmental atelectasis scarring is noted inferiorly in the right middle lobe. Right posterior upper lobe opacity is noted concerning for atelectasis or possibly infiltrate. 6 mm nodule is noted anteriorly in the right lower lobe best seen on image number 69 of series 6. Upper Abdomen: No acute abnormality. Musculoskeletal: No chest wall abnormality. No acute or significant osseous findings. Review of the MIP images confirms the above findings. IMPRESSION: No definite evidence of pulmonary embolus. Small left pleural effusion is noted with adjacent subsegmental atelectasis. Right posterior upper lobe opacity is noted concerning for atelectasis or possibly infiltrate. Small sliding-type hiatal hernia. 6 mm nodule is noted anteriorly in the right lower lobe. Non-contrast chest CT at 6-12 months is recommended. If the nodule is stable at time of repeat CT, then future CT at 18-24 months (from today's scan) is considered optional for low-risk patients, but is recommended for high-risk patients. This recommendation follows the consensus statement: Guidelines for Management of Incidental Pulmonary Nodules Detected on CT Images: From the Fleischner Society 2017; Radiology 2017; 284:228-243. Aortic Atherosclerosis (ICD10-I70.0). Electronically Signed   By: Lupita Raider M.D.   On: 12/12/2020 11:08   DG Chest Portable 1 View  Result Date: 12/19/2020 CLINICAL DATA:  Shortness of breath.  Difficulty breathing. EXAM: PORTABLE CHEST 1 VIEW COMPARISON:  Radiograph and CT 12/12/2020. FINDINGS: Unchanged heart size and mediastinal contours. Aortic atherosclerosis. Small left  pleural effusion and basilar opacity, slightly progressed from imaging 1 week ago. Increasing patchy right basilar opacity. Increasing peribronchial thickening. No pneumothorax. No acute osseous abnormalities are seen. IMPRESSION: Small left pleural effusion and basilar opacity, slightly progressed from imaging 1 week ago. Increasing patchy right basilar opacity may be atelectasis or pneumonia. Given distribution, consider aspiration. Electronically Signed   By: Narda Rutherford M.D.   On: 12/19/2020 18:19   DG Chest Portable 1 View  Result Date: 12/12/2020 CLINICAL DATA:  Chest pain and left arm pain. EXAM: PORTABLE CHEST 1 VIEW COMPARISON:  02/29/2020 FINDINGS: New densities at the left lung base. Heart size is within normal limits and stable. There may be vascular congestion or densities at the medial right lung base. Patient is slightly rotated towards the left. No acute bone abnormality. IMPRESSION: New basilar lung densities particularly on the left side. Findings could be related to atelectasis and small amount of pleural fluid. Difficult to exclude infection. Electronically Signed   By: Richarda Overlie M.D.   On: 12/12/2020 09:34   DG Shoulder Left  Result Date: 12/12/2020 CLINICAL DATA:  Left chest pain. EXAM: LEFT SHOULDER - 2+ VIEW COMPARISON:  None. FINDINGS: There is no evidence of fracture or dislocation. There is no evidence of arthropathy or other  focal bone abnormality. Soft tissues are unremarkable. IMPRESSION: Negative. Electronically Signed   By: Lupita Raider M.D.   On: 12/12/2020 09:34   ECHOCARDIOGRAM COMPLETE  Result Date: 12/21/2020    ECHOCARDIOGRAM REPORT   Patient Name:   TEENA MANGUS Ranker Date of Exam: 12/21/2020 Medical Rec #:  [id number]       Height:       67.0 in Accession #:    [id number]      Weight:       179.5 lb Date of Birth:  [birth year]        BSA:          1.931 m Patient Age:    79 years        BP:           113/53 mmHg Patient Gender: F               HR:           81 bpm.  Exam Location:  [facility] Procedure: 2D Echo and Strain Analysis Indications:     Dyspnea R06.00  History:         Patient has no prior history of Echocardiogram examinations.  Sonographer:     Wonda Cerise RDCS Referring Phys:  [id number] Dorcas Carrow Diagnosing Phys: Jodelle Red MD IMPRESSIONS  1. Left ventricular ejection fraction, by estimation, is 65 to 70%. The left ventricle has normal function. The left ventricle has no regional wall motion abnormalities. Left ventricular diastolic parameters are indeterminate.  2. Right ventricular systolic function is normal. The right ventricular size is normal. Tricuspid regurgitation signal is inadequate for assessing PA pressure.  3. Right atrial size was mildly dilated.  4. The mitral valve is grossly normal. Trivial mitral valve regurgitation. No evidence of mitral stenosis.  5. The aortic valve was not well visualized. There is moderate calcification of the aortic valve. There is mild thickening of the aortic valve. Aortic valve regurgitation is not visualized. Mild to moderate aortic valve sclerosis/calcification is present, without any evidence of aortic stenosis.  6. The inferior vena cava is normal in size with greater than 50% respiratory variability, suggesting right atrial pressure of 3 mmHg. Comparison(s): No prior Echocardiogram. Conclusion(s)/Recommendation(s): Otherwise normal echocardiogram, with minor abnormalities described in the report. FINDINGS  Left Ventricle: Left ventricular ejection fraction, by estimation, is 65 to 70%. The left ventricle has normal function. The left ventricle has no regional wall motion abnormalities. Global longitudinal strain performed but not reported based on interpreter judgement due to suboptimal tracking. The left ventricular internal cavity size was normal in size. There is no left ventricular hypertrophy. Left ventricular diastolic parameters are indeterminate. Right Ventricle: The right ventricular size is  normal. Right vetricular wall thickness was not well visualized. Right ventricular systolic function is normal. Tricuspid regurgitation signal is inadequate for assessing PA pressure. Left Atrium: Left atrial size was normal in size. Right Atrium: Right atrial size was mildly dilated. Pericardium: There is no evidence of pericardial effusion. Mitral Valve: The mitral valve is grossly normal. Trivial mitral valve regurgitation. No evidence of mitral valve stenosis. Tricuspid Valve: The tricuspid valve is normal in structure. Tricuspid valve regurgitation is trivial. No evidence of tricuspid stenosis. Aortic Valve: The aortic valve was not well visualized. There is moderate calcification of the aortic valve. There is mild thickening of the aortic valve. Aortic valve regurgitation is not visualized. Mild to moderate aortic valve sclerosis/calcification  is present, without any evidence of aortic stenosis. Aortic valve  peak gradient measures 12.0 mmHg. Pulmonic Valve: The pulmonic valve was not well visualized. Pulmonic valve regurgitation is not visualized. No evidence of pulmonic stenosis. Aorta: The aortic root and ascending aorta are structurally normal, with no evidence of dilitation. Venous: The inferior vena cava is normal in size with greater than 50% respiratory variability, suggesting right atrial pressure of 3 mmHg. IAS/Shunts: The atrial septum is grossly normal.  LEFT VENTRICLE PLAX 2D LVIDd:         3.69 cm  Diastology LVIDs:         2.26 cm  LV e' medial:    8.49 cm/s LV PW:         0.87 cm  LV E/e' medial:  8.2 LV IVS:        0.90 cm  LV e' lateral:   6.20 cm/s LVOT diam:     1.80 cm  LV E/e' lateral: 11.3 LV SV:         55 LV SV Index:   29 LVOT Area:     2.54 cm  RIGHT VENTRICLE RV Basal diam:  2.89 cm RV S prime:     15.20 cm/s TAPSE (M-mode): 1.9 cm LEFT ATRIUM             Index       RIGHT ATRIUM           Index LA diam:        3.00 cm 1.55 cm/m  RA Area:     14.50 cm LA Vol (A2C):   31.6 ml  16.36 ml/m RA Volume:   39.90 ml  20.66 ml/m LA Vol (A4C):   34.9 ml 18.07 ml/m LA Biplane Vol: 35.0 ml 18.12 ml/m  AORTIC VALVE                PULMONIC VALVE AV Area (Vmax): 1.86 cm    PV Vmax:       1.02 m/s AV Vmax:        173.50 cm/s PV Peak grad:  4.2 mmHg AV Peak Grad:   12.0 mmHg LVOT Vmax:      127.00 cm/s LVOT Vmean:     74.400 cm/s LVOT VTI:       0.218 m  AORTA Ao Root diam: 3.00 cm MITRAL VALVE               TRICUSPID VALVE MV Area (PHT): 3.36 cm    TV Peak grad:   28.8 mmHg MV Decel Time: 226 msec    TV Vmax:        2.68 m/s MV E velocity: 69.80 cm/s MV A velocity: 98.50 cm/s  SHUNTS MV E/A ratio:  0.71        Systemic VTI:  0.22 m                            Systemic Diam: 1.80 cm Jodelle Red MD Electronically signed by Jodelle Red MD Signature Date/Time: 12/21/2020/5:38:20 PM    Final       Subjective: No complaints this am. Denies sob, cp, fever chills  Discharge Exam: Vitals:   12/25/20 0940 12/25/20 1134  BP: 107/61 108/69  Pulse: 73 88  Resp: 18 15  Temp: 98.1 F (36.7 C) 98.2 F (36.8 C)  SpO2: 96% 91%   Vitals:   12/24/20 2031 12/25/20 0447 12/25/20 0940 12/25/20 1134  BP: (!) 104/58 (!) 115/59 107/61 108/69  Pulse: 87 70 73 88  Resp: 18 18 18 15   Temp: 98.6 F (37 C) 98.5 F (36.9 C) 98.1 F (36.7 C) 98.2 F (36.8 C)  TempSrc: Oral Oral  Oral  SpO2: 97% 97% 96% 91%  Weight:      Height:        General: Pt is alert, awake, not in acute distress Cardiovascular: RRR, S1/S2 +, no rubs, no gallops Respiratory: CTA bilaterally, no wheezing, no rhonchi Abdominal: Soft, NT, ND, bowel sounds + Extremities: no edem    The results of significant diagnostics from this hospitalization (including imaging, microbiology, ancillary and laboratory) are listed below for reference.     Microbiology: Recent Results (from the past 240 hour(s))  Resp Panel by RT-PCR (Flu A&B, Covid) Nasopharyngeal Swab     Status: None   Collection Time:  12/19/20  5:50 PM   Specimen: Nasopharyngeal Swab; Nasopharyngeal(NP) swabs in vial transport medium  Result Value Ref Range Status   SARS Coronavirus 2 by RT PCR NEGATIVE NEGATIVE Final    Comment: (NOTE) SARS-CoV-2 target nucleic acids are NOT DETECTED.  The SARS-CoV-2 RNA is generally detectable in upper respiratory specimens during the acute phase of infection. The lowest concentration of SARS-CoV-2 viral copies this assay can detect is 138 copies/mL. A negative result does not preclude SARS-Cov-2 infection and should not be used as the sole basis for treatment or other patient management decisions. A negative result may occur with  improper specimen collection/handling, submission of specimen other than nasopharyngeal swab, presence of viral mutation(s) within the areas targeted by this assay, and inadequate number of viral copies(<138 copies/mL). A negative result must be combined with clinical observations, patient history, and epidemiological information. The expected result is Negative.  Fact Sheet for Patients:  BloggerCourse.comhttps://www.fda.gov/media/152166/download  Fact Sheet for Healthcare Providers:  SeriousBroker.ithttps://www.fda.gov/media/152162/download  This test is no t yet approved or cleared by the [place] FDA and  has been authorized for detection and/or diagnosis of SARS-CoV-2 by FDA under an Emergency Use Authorization (EUA). This EUA will remain  in effect (meaning this test can be used) for the duration of the COVID-19 declaration under Section 564(b)(1) of the Act, 21 U.S.C.section 360bbb-3(b)(1), unless the authorization is terminated  or revoked sooner.       Influenza A by PCR NEGATIVE NEGATIVE Final   Influenza B by PCR NEGATIVE NEGATIVE Final    Comment: (NOTE) The Xpert Xpress SARS-CoV-2/FLU/RSV plus assay is intended as an aid in the diagnosis of influenza from Nasopharyngeal swab specimens and should not be used as a sole basis for treatment. Nasal washings  and aspirates are unacceptable for Xpert Xpress SARS-CoV-2/FLU/RSV testing.  Fact Sheet for Patients: BloggerCourse.comhttps://www.fda.gov/media/152166/download  Fact Sheet for Healthcare Providers: SeriousBroker.ithttps://www.fda.gov/media/152162/download  This test is not yet approved or cleared by the [place] FDA and has been authorized for detection and/or diagnosis of SARS-CoV-2 by FDA under an Emergency Use Authorization (EUA). This EUA will remain in effect (meaning this test can be used) for the duration of the COVID-19 declaration under Section 564(b)(1) of the Act, 21 U.S.C. section 360bbb-3(b)(1), unless the authorization is terminated or revoked.  Performed at [facility], [address] [place], [place] [address]   Blood culture (routine x 2)     Status: None   Collection Time: 12/19/20  7:20 PM   Specimen: BLOOD  Result Value Ref Range Status   Specimen Description BLOOD  RIGHT FOREARM   Final   Special Requests   Final    BOTTLES DRAWN AEROBIC AND ANAEROBIC  Blood Culture adequate volume   Culture   Final    NO GROWTH 5 DAYS Performed at [facility], [address] [place], [place] [address]    Report Status 12/24/2020 FINAL  Final  Blood culture (routine x 2)     Status: None   Collection Time: 12/19/20  7:30 PM   Specimen: BLOOD  Result Value Ref Range Status   Specimen Description BLOOD  LEFT FOREARM  Final   Special Requests   Final    BOTTLES DRAWN AEROBIC AND ANAEROBIC Blood Culture results may not be optimal due to an inadequate volume of blood received in culture bottles   Culture   Final    NO GROWTH 5 DAYS Performed at [facility], [address] [place], [place] [address]    Report Status 12/24/2020 FINAL  Final  MRSA PCR Screening     Status: None   Collection Time: 12/20/20  6:09 AM   Specimen: Nasal Mucosa; Nasopharyngeal  Result Value Ref Range Status   MRSA by PCR NEGATIVE NEGATIVE Final    Comment:        The GeneXpert MRSA  Assay (FDA approved for NASAL specimens only), is one component of a comprehensive MRSA colonization surveillance program. It is not intended to diagnose MRSA infection nor to guide or monitor treatment for MRSA infections. Performed at [facility], [address] [place], [place] [address]   Culture, Urine     Status: None   Collection Time: 12/20/20  7:52 AM   Specimen: Urine, Random  Result Value Ref Range Status   Specimen Description   Final    URINE, RANDOM Performed at [facility], [address] [place], [place] [address]    Special Requests   Final    NONE Performed at [facility], [address], [place], [place] [address]    Culture   Final    NO GROWTH Performed at [facility], [address], [place], [place] [address]    Report Status 12/21/2020 FINAL  Final  Resp Panel by RT-PCR (Flu A&B, Covid) Nasopharyngeal Swab     Status: None   Collection Time: 12/25/20 10:47 AM   Specimen: Nasopharyngeal Swab; Nasopharyngeal(NP) swabs in vial transport medium  Result Value Ref Range Status   SARS Coronavirus 2 by RT PCR NEGATIVE NEGATIVE Final    Comment: (NOTE) SARS-CoV-2 target nucleic acids are NOT DETECTED.  The SARS-CoV-2 RNA is generally detectable in upper respiratory specimens during the acute phase of infection. The lowest concentration of SARS-CoV-2 viral copies this assay can detect is 138 copies/mL. A negative result does not preclude SARS-Cov-2 infection and should not be used as the sole basis for treatment or other patient management decisions. A negative result may occur with  improper specimen collection/handling, submission of specimen other than nasopharyngeal swab, presence of viral mutation(s) within the areas targeted by this assay, and inadequate number of viral copies(<138 copies/mL). A negative result must be combined with clinical observations, patient history, and epidemiological information. The  expected result is Negative.  Fact Sheet for Patients:  BloggerCourse.com  Fact Sheet for Healthcare Providers:  SeriousBroker.it  This test is no t yet approved or cleared by the [place] FDA and  has been authorized for detection and/or diagnosis of SARS-CoV-2 by FDA under an Emergency Use Authorization (EUA). This EUA will remain  in effect (meaning this test can be used) for the duration of the COVID-19 declaration under Section 564(b)(1)  of the Act, 21 U.S.C.section 360bbb-3(b)(1), unless the authorization is terminated  or revoked sooner.       Influenza A by PCR NEGATIVE NEGATIVE Final   Influenza B by PCR NEGATIVE NEGATIVE Final    Comment: (NOTE) The Xpert Xpress SARS-CoV-2/FLU/RSV plus assay is intended as an aid in the diagnosis of influenza from Nasopharyngeal swab specimens and should not be used as a sole basis for treatment. Nasal washings and aspirates are unacceptable for Xpert Xpress SARS-CoV-2/FLU/RSV testing.  Fact Sheet for Patients: BloggerCourse.com  Fact Sheet for Healthcare Providers: SeriousBroker.it  This test is not yet approved or cleared by the [place] FDA and has been authorized for detection and/or diagnosis of SARS-CoV-2 by FDA under an Emergency Use Authorization (EUA). This EUA will remain in effect (meaning this test can be used) for the duration of the COVID-19 declaration under Section 564(b)(1) of the Act, 21 U.S.C. section 360bbb-3(b)(1), unless the authorization is terminated or revoked.  Performed at [facility], [address] [place], [place] [address]      Labs: BNP (last 3 results) No results for input(s): BNP in the last 8760 hours. Basic Metabolic Panel: Recent Labs  Lab 12/19/20 1711 12/20/20 0532 12/21/20 0414 12/22/20 0352 12/24/20 1344 12/25/20 0421  NA 142 140 142 142  --  140  K 3.3*  3.8 4.1 4.2  --  4.2  CL 106 110 110 108  --  104  CO2 --  30  GLUCOSE 90 74 95 99  --  92  BUN --  30*  CREATININE 0.58 0.63 0.48 0.46  --  0.64  CALCIUM 9.2 8.5* 8.8* 8.8*  --  9.2  MG 1.1*  --  1.9 1.5* 1.6*  --   PHOS  --   --  2.9 2.9  --   --    Liver Function Tests: Recent Labs  Lab 12/20/20 0532 12/22/20 0352  AST 18 15  ALT 15 13  ALKPHOS 49 54  BILITOT 0.7 0.2*  PROT 4.9* 5.2*  ALBUMIN 2.3* 2.4*   No results for input(s): LIPASE, AMYLASE in the last 168 hours. No results for input(s): AMMONIA in the last 168 hours. CBC: Recent Labs  Lab 12/19/20 1711 12/20/20 0532 12/21/20 0414 12/22/20 0352 12/25/20 0421  WBC 11.2* 7.9 6.9 7.9 6.8  NEUTROABS 7.7  --  3.4 4.2  --   HGB 11.3* 9.5* 10.9* 10.6* 9.9*  HCT 35.4* 29.7* 33.1* 33.3* 32.0*  MCV 90.1 91.1 90.9 92.0 93.3  PLT 667* 531* 580* 571* 441*   Cardiac Enzymes: No results for input(s): CKTOTAL, CKMB, CKMBINDEX, TROPONINI in the last 168 hours. BNP: Invalid input(s): POCBNP CBG: Recent Labs  Lab 12/24/20 1229 12/24/20 1949 12/25/20 0012 12/25/20 0555 12/25/20 1135  GLUCAP 117* 110* 102* 88 102*   D-Dimer No results for input(s): DDIMER in the last 72 hours. Hgb A1c No results for input(s): HGBA1C in the last 72 hours. Lipid Profile No results for input(s): CHOL, HDL, LDLCALC, TRIG, CHOLHDL, LDLDIRECT in the last 72 hours. Thyroid function studies No results for input(s): TSH, T4TOTAL, T3FREE, THYROIDAB in the last 72 hours.  Invalid input(s): FREET3 Anemia work up No results for input(s): VITAMINB12, FOLATE, FERRITIN, TIBC, IRON, RETICCTPCT in the last 72 hours. Urinalysis    Component Value Date/Time   COLORURINE YELLOW (A) 12/19/2020 0752   APPEARANCEUR CLOUDY (A) 12/19/2020 0752   APPEARANCEUR Clear 10/22/2012 0404   LABSPEC  1.017 12/19/2020 0752   LABSPEC 1.035 10/22/2012 0404   PHURINE 6.0 12/19/2020 0752   GLUCOSEU NEGATIVE 12/19/2020 0752   GLUCOSEU  Negative 10/22/2012 0404   HGBUR NEGATIVE 12/19/2020 0752   BILIRUBINUR NEGATIVE 12/19/2020 0752   BILIRUBINUR Negative 10/22/2012 0404   KETONESUR 20 (A) 12/19/2020 0752   PROTEINUR NEGATIVE 12/19/2020 0752   UROBILINOGEN 0.2 06/14/2011 1805   NITRITE NEGATIVE 12/19/2020 0752   LEUKOCYTESUR SMALL (A) 12/19/2020 0752   LEUKOCYTESUR Negative 10/22/2012 0404   Sepsis Labs Invalid input(s): PROCALCITONIN,  WBC,  LACTICIDVEN Microbiology Recent Results (from the past 240 hour(s))  Resp Panel by RT-PCR (Flu A&B, Covid) Nasopharyngeal Swab     Status: None   Collection Time: 12/19/20  5:50 PM   Specimen: Nasopharyngeal Swab; Nasopharyngeal(NP) swabs in vial transport medium  Result Value Ref Range Status   SARS Coronavirus 2 by RT PCR NEGATIVE NEGATIVE Final    Comment: (NOTE) SARS-CoV-2 target nucleic acids are NOT DETECTED.  The SARS-CoV-2 RNA is generally detectable in upper respiratory specimens during the acute phase of infection. The lowest concentration of SARS-CoV-2 viral copies this assay can detect is 138 copies/mL. A negative result does not preclude SARS-Cov-2 infection and should not be used as the sole basis for treatment or other patient management decisions. A negative result may occur with  improper specimen collection/handling, submission of specimen other than nasopharyngeal swab, presence of viral mutation(s) within the areas targeted by this assay, and inadequate number of viral copies(<138 copies/mL). A negative result must be combined with clinical observations, patient history, and epidemiological information. The expected result is Negative.  Fact Sheet for Patients:  BloggerCourse.com  Fact Sheet for Healthcare Providers:  SeriousBroker.it  This test is no t yet approved or cleared by the [place] FDA and  has been authorized for detection and/or diagnosis of SARS-CoV-2 by FDA under an Emergency Use  Authorization (EUA). This EUA will remain  in effect (meaning this test can be used) for the duration of the COVID-19 declaration under Section 564(b)(1) of the Act, 21 U.S.C.section 360bbb-3(b)(1), unless the authorization is terminated  or revoked sooner.       Influenza A by PCR NEGATIVE NEGATIVE Final   Influenza B by PCR NEGATIVE NEGATIVE Final    Comment: (NOTE) The Xpert Xpress SARS-CoV-2/FLU/RSV plus assay is intended as an aid in the diagnosis of influenza from Nasopharyngeal swab specimens and should not be used as a sole basis for treatment. Nasal washings and aspirates are unacceptable for Xpert Xpress SARS-CoV-2/FLU/RSV testing.  Fact Sheet for Patients: BloggerCourse.com  Fact Sheet for Healthcare Providers: SeriousBroker.it  This test is not yet approved or cleared by the [place] FDA and has been authorized for detection and/or diagnosis of SARS-CoV-2 by FDA under an Emergency Use Authorization (EUA). This EUA will remain in effect (meaning this test can be used) for the duration of the COVID-19 declaration under Section 564(b)(1) of the Act, 21 U.S.C. section 360bbb-3(b)(1), unless the authorization is terminated or revoked.  Performed at [facility], [address] [place], [place] [address]   Blood culture (routine x 2)     Status: None   Collection Time: 12/19/20  7:20 PM   Specimen: BLOOD  Result Value Ref Range Status   Specimen Description BLOOD  RIGHT FOREARM   Final   Special Requests   Final    BOTTLES DRAWN AEROBIC AND ANAEROBIC Blood Culture adequate volume   Culture   Final    NO GROWTH 5  DAYS Performed at [facility], [address] [place], [place] [address]    Report Status 12/24/2020 FINAL  Final  Blood culture (routine x 2)     Status: None   Collection Time: 12/19/20  7:30 PM   Specimen: BLOOD  Result Value Ref Range Status   Specimen Description BLOOD   LEFT FOREARM  Final   Special Requests   Final    BOTTLES DRAWN AEROBIC AND ANAEROBIC Blood Culture results may not be optimal due to an inadequate volume of blood received in culture bottles   Culture   Final    NO GROWTH 5 DAYS Performed at [facility], [address] [place], [place] [address]    Report Status 12/24/2020 FINAL  Final  MRSA PCR Screening     Status: None   Collection Time: 12/20/20  6:09 AM   Specimen: Nasal Mucosa; Nasopharyngeal  Result Value Ref Range Status   MRSA by PCR NEGATIVE NEGATIVE Final    Comment:        The GeneXpert MRSA Assay (FDA approved for NASAL specimens only), is one component of a comprehensive MRSA colonization surveillance program. It is not intended to diagnose MRSA infection nor to guide or monitor treatment for MRSA infections. Performed at [facility], [address] [place], [place] [address]   Culture, Urine     Status: None   Collection Time: 12/20/20  7:52 AM   Specimen: Urine, Random  Result Value Ref Range Status   Specimen Description   Final    URINE, RANDOM Performed at [facility], [address], [place], [place] [address]    Special Requests   Final    NONE Performed at [facility], [address], [place], [place] [address]    Culture   Final    NO GROWTH Performed at [facility], [address] [place], [place] [address]    Report Status 12/21/2020 FINAL  Final  Resp Panel by RT-PCR (Flu A&B, Covid) Nasopharyngeal Swab     Status: None   Collection Time: 12/25/20 10:47 AM   Specimen: Nasopharyngeal Swab; Nasopharyngeal(NP) swabs in vial transport medium  Result Value Ref Range Status   SARS Coronavirus 2 by RT PCR NEGATIVE NEGATIVE Final    Comment: (NOTE) SARS-CoV-2 target nucleic acids are NOT DETECTED.  The SARS-CoV-2 RNA is generally detectable in upper respiratory specimens during the acute phase of infection. The lowest concentration of  SARS-CoV-2 viral copies this assay can detect is 138 copies/mL. A negative result does not preclude SARS-Cov-2 infection and should not be used as the sole basis for treatment or other patient management decisions. A negative result may occur with  improper specimen collection/handling, submission of specimen other than nasopharyngeal swab, presence of viral mutation(s) within the areas targeted by this assay, and inadequate number of viral copies(<138 copies/mL). A negative result must be combined with clinical observations, patient history, and epidemiological information. The expected result is Negative.  Fact Sheet for Patients:  BloggerCourse.com  Fact Sheet for Healthcare Providers:  SeriousBroker.it  This test is no t yet approved or cleared by the [place] FDA and  has been authorized for detection and/or diagnosis of SARS-CoV-2 by FDA under an Emergency Use Authorization (EUA). This EUA will remain  in effect (meaning this test can be used) for the duration of the COVID-19 declaration under Section 564(b)(1) of the Act, 21 U.S.C.section 360bbb-3(b)(1), unless the authorization is terminated  or revoked sooner.  Influenza A by PCR NEGATIVE NEGATIVE Final   Influenza B by PCR NEGATIVE NEGATIVE Final    Comment: (NOTE) The Xpert Xpress SARS-CoV-2/FLU/RSV plus assay is intended as an aid in the diagnosis of influenza from Nasopharyngeal swab specimens and should not be used as a sole basis for treatment. Nasal washings and aspirates are unacceptable for Xpert Xpress SARS-CoV-2/FLU/RSV testing.  Fact Sheet for Patients: BloggerCourse.com  Fact Sheet for Healthcare Providers: SeriousBroker.it  This test is not yet approved or cleared by the [place] FDA and has been authorized for detection and/or diagnosis of SARS-CoV-2 by FDA under an Emergency Use  Authorization (EUA). This EUA will remain in effect (meaning this test can be used) for the duration of the COVID-19 declaration under Section 564(b)(1) of the Act, 21 U.S.C. section 360bbb-3(b)(1), unless the authorization is terminated or revoked.  Performed at [facility], [address] [place], [place] [address]      Time coordinating discharge: Over 30 minutes  SIGNED:   Lynn Ito, MD  [facility] 12/25/2020, 12:04 PM Pager   If 7PM-7AM, please contact night-coverage [email or web address] Password [email or web address]

## 2020-12-25 NOTE — TOC Transition Note (Signed)
Transition of Care [facility]) - CM/SW Discharge Note   Patient Details  Name: [name] MRN: [id number] Date of Birth: 08-29-1941  Transition [facility]) CM/SW Contact:  Chapman Fitch, RN Phone Number: 12/25/2020, 3:29 PM   Clinical Narrative:    Guardian has accepted bed at [facility] Patient to discharge today Bedside RN to call report  EMS packet and DNR on chart EMS transport called  DC info sent in HUB Bedside RN to call report  Dss is aware of discharge and has been updated by MD   Final next level of care: Skilled Nursing Facility     Patient Goals and CMS Choice        Discharge Placement              Patient chooses bed at: [facility] Patient to be transferred to facility by: EMS Name of family member notified: [name] DSS Patient and family notified of of transfer: 12/25/20  Discharge Plan and Services                                     Social Determinants of Health (SDOH) Interventions     Readmission Risk Interventions Readmission Risk Prevention Plan 12/25/2020  Transportation Screening Complete  PCP or Specialist Appt within 3-5 Days Complete  HRI or Home Care Consult Complete  Social Work Consult for Recovery Care Planning/Counseling Complete  Palliative Care Screening Complete  Medication Review [facility]) Complete  Some recent data might be hidden

## 2020-12-25 NOTE — Progress Notes (Signed)
[facility] Room 202 [facility]) Hospital Liaison RN note:  Received new referral for [facility] out patient palliative program to follow post discharge to [facility] from [facility], TOC. Patient information given to referral. Will follow for disposition.  Thank you for this referral.  Cyndra Numbers, RN [facility] Liaison [phone]

## 2020-12-25 NOTE — Plan of Care (Signed)
  Problem: Clinical Measurements: Goal: Respiratory complications will improve Outcome: Progressing   Problem: Nutrition: Goal: Adequate nutrition will be maintained Outcome: Progressing   Problem: Pain Managment: Goal: General experience of comfort will improve Outcome: Progressing    

## 2020-12-25 NOTE — TOC Progression Note (Signed)
Transition of Care [facility]) - Progression Note    Patient Details  Name: [name] MRN: [id number] Date of Birth: 1941/04/07  Transition of Care [facility]) CM/SW Contact  Chapman Fitch, RN Phone Number: 12/25/2020, 8:44 AM  Clinical Narrative:    [name] [phone] H        Expected Discharge Plan and Services                                                 Social Determinants of Health (SDOH) Interventions    Readmission Risk Interventions No flowsheet data found.

## 2020-12-25 NOTE — Progress Notes (Signed)
Physical Therapy Treatment Patient Details Name: [name] MRN: [id number] DOB: 1941-03-03 Today's Date: 12/25/2020    History of Present Illness 80 year old female from assisted living facility with history of bipolar disorder, dementia, insomnia, peripheral arterial disease, GERD and chronic pain syndrome who was recently admitted to the hospital brought back to the emergency room with shortness of breath found at her assisted living place. Found to have acute respiratory failure with hypoxia, pneumonia,    PT Comments    Pt initially refusing session but agreed with mod encouragement to supine exercises.  Participated in exercises as described below.  Pt self limits exercises and self terminates session "that is enough" and covers legs back up.  She is able to reposition herself in bed for pressure relief.  Encouraged OOB to chair or at least sitting EOB but she again declines at end of session.  She does state she is "scared".  Encouragement and education given but she remains firm in her refusal.   Follow Up Recommendations  SNF     Equipment Recommendations  None recommended by PT    Recommendations for Other Services       Precautions / Restrictions Precautions Precautions: Fall Restrictions Weight Bearing Restrictions: No    Mobility  Bed Mobility               General bed mobility comments: refuysed    Transfers                    Ambulation/Gait                 Stairs             Wheelchair Mobility    Modified Rankin (Stroke Patients Only)       Balance                                            Cognition Arousal/Alertness: Awake/alert Behavior During Therapy: Flat affect Overall Cognitive Status: History of cognitive impairments - at baseline                                        Exercises Other Exercises Other Exercises: supine/seated ther-ex on B LE including LAQ, hip  abd/add, SLRs, QS, and ankle pumps. All ther-ex performed x 10 reps with min assist and safe technique. Cues for correct technique    General Comments        Pertinent Vitals/Pain Pain Assessment: No/denies pain    Home Living                      Prior Function            PT Goals (current goals can now be found in the care plan section) Progress towards PT goals: Not progressing toward goals - comment    Frequency    Min 2X/week      PT Plan Current plan remains appropriate    Co-evaluation              AM-PAC PT "6 Clicks" Mobility   Outcome Measure  Help needed turning from your back to your side while in a flat bed without using bedrails?: A Lot Help needed moving from lying on your back to sitting on  the side of a flat bed without using bedrails?: A Lot Help needed moving to and from a bed to a chair (including a wheelchair)?: A Lot Help needed standing up from a chair using your arms (e.g., wheelchair or bedside chair)?: A Lot Help needed to walk in hospital room?: Total Help needed climbing 3-5 steps with a railing? : Total 6 Click Score: 10    End of Session   Activity Tolerance: Other (comment) (limited by fear)   Nurse Communication: Mobility status PT Visit Diagnosis: Unsteadiness on feet (R26.81);Muscle weakness (generalized) (M62.81)     Time: 3748-2707 PT Time Calculation (min) (ACUTE ONLY): 8 min  Charges:  $Therapeutic Exercise: 8-22 mins                    Danielle Dess, PTA 12/25/20, 11:18 AM

## 2021-01-08 ENCOUNTER — Other Ambulatory Visit: Payer: Self-pay

## 2021-01-08 ENCOUNTER — Non-Acute Institutional Stay: Payer: Medicare Other | Admitting: Primary Care

## 2021-01-08 DIAGNOSIS — F0391 Unspecified dementia with behavioral disturbance: Secondary | ICD-10-CM

## 2021-01-08 DIAGNOSIS — J9601 Acute respiratory failure with hypoxia: Secondary | ICD-10-CM

## 2021-01-08 DIAGNOSIS — Z515 Encounter for palliative care: Secondary | ICD-10-CM

## 2021-01-08 NOTE — Progress Notes (Signed)
[facility] Palliative Care Consult Note Telephone: [phone]  Fax: [phone]    Date of encounter: 01/08/21 PATIENT NAME: [name] [address] [place] [address]   [phone] (home)  DOB: Aug 07, 1941 MRN: [id number] PRIMARY CARE PROVIDER:   Rica Koyanagi, MD [place] [address] [phone]  REFERRING PROVIDER:   Rica Koyanagi, MD [place] [address] [phone]  RESPONSIBLE PARTY:    Contact Information    Name Relation Home Work [place], [place] [phone]   [name] [phone]  [phone]       I met face to face with patient in  home. Palliative Care was asked to follow this patient by consultation request of  Rica Koyanagi, MD to address advance care planning and complex medical decision making. This is the initial visit.                                    ASSESSMENT AND PLAN / RECOMMENDATIONS:    Advance Care Planning/Goals of Care: Goals include to maximize quality of life and symptom management.   Has legal guardian, [name], Amber  CODE STATUS: FULL CODE  Symptom Management/Plan:  Met with patient in nursing home room. She was awake and interactive. She states she has some pain, eats well and uses oxygen at night. She states she is doing exercises here and trying to get stronger.   She endorses being very cold, and I got her some more covers. She states she is also painful, so asked SNF staff to give PRN tylenol. I would recommend scheduling tylenol ATC if pain continues.  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 She is eating well however, but still losing weight. 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   Follow up Palliative Care Visit: Palliative care will continue to follow for complex medical decision making, advance care planning, and clarification of goals.  Return 4 weeks or prn.  I spent 35 minutes providing this consultation. More than 50% of the time in this consultation was spent in counseling and care coordination.  PPS: 30%  HOSPICE ELIGIBILITY/DIAGNOSIS: TBD  Chief Complaint: debility  HISTORY OF PRESENT ILLNESS:  [name] is a 80 y.o. year old female  with h/o hip fx in 2/22, bipolar, dementia, insomnia, gait disorder, PAD, hyperlipidemia, GERD, chronic pain. Was in ED from ALF for SOB, transferred to SNF for rehab.  History obtained from review of EMR, discussion with primary team, and interview with family, facility staff/caregiver and/or Ms. Murcia.  I reviewed available labs, medications, imaging, studies and related documents from the EMR.  Records reviewed and summarized above.   ROS/staff General: NAD ENMT: denies dysphagia Cardiovascular: denies chest pain, denies DOE Pulmonary: denies cough, denies increased SOB Abdomen: endorses good appetite, denies constipation, endorses incontinence of bowel GU: denies dysuria, endorses incontinence of urine MSK:  endorses weakness,  no falls reported Skin: denies rashes or wounds Neurological: endorses general  pain, denies insomnia Psych: Endorses flat mood Heme/lymph/immuno: denies bruises, abnormal bleeding  Physical Exam: Current and past weights: 114-116 lbs now, historically 124 lbs.  Constitutional: NAD General: frail appearing, thin EYES: anicteric sclera, lids intact, no discharge  ENMT: intact hearing, oral mucous membranes moist CV: RRR, no LE edema Pulmonary: LCTA, no increased work of breathing, no cough,  room air, oxygen at hs Abdomen: intake 75-100%, no ascites MSK: severe sarcopenia, moves all extremities, non ambulatory Skin: warm and dry, no rashes or wounds on visible skin Neuro:  + generalized weakness,  severe cognitive impairment Psych: anxious affect, A and O x 1 Hem/lymph/immuno: no widespread bruising   CURRENT PROBLEM LIST:  Patient  Active Problem List   Diagnosis Date Noted  . HCAP (healthcare-associated pneumonia) 12/19/2020  . PAD (peripheral artery disease) ([place]) 12/19/2020  . HLD (hyperlipidemia) 12/19/2020  . Insomnia 12/19/2020  . GERD (gastroesophageal reflux disease) 12/19/2020  . Bipolar disorder ([place]) 12/15/2020  . Dementia with behavioral disturbance ([place]) 12/15/2020  . SOB (shortness of breath) 12/12/2020  . Acute respiratory failure with hypoxia ([place]) 12/12/2020  . Community acquired pneumonia 12/12/2020  . Pressure injury of skin 09/25/2019  . Fall   . Closed left subtrochanteric femur fracture ([place]) 09/21/2019   PAST MEDICAL HISTORY:  Active Ambulatory Problems    Diagnosis Date Noted  . Closed left subtrochanteric femur fracture ([place]) 09/21/2019  . Fall   . Pressure injury of skin 09/25/2019  . SOB (shortness of breath) 12/12/2020  . Acute respiratory failure with hypoxia ([place]) 12/12/2020  . Community acquired pneumonia 12/12/2020  . Bipolar disorder ([place]) 12/15/2020  . Dementia with behavioral disturbance ([place]) 12/15/2020  . HCAP (healthcare-associated pneumonia) 12/19/2020  . PAD (peripheral artery disease) ([place]) 12/19/2020  . HLD (hyperlipidemia) 12/19/2020  . Insomnia 12/19/2020  . GERD (gastroesophageal reflux disease) 12/19/2020   Resolved Ambulatory Problems    Diagnosis Date Noted  . No Resolved Ambulatory Problems   Past Medical History:  Diagnosis Date  . Alzheimer's dementia ([place])   . Low back pain   . Vomiting    SOCIAL HX:  Social History   Tobacco Use  . Smoking status: Former Research scientist (life sciences)  . Smokeless tobacco: Never Used  Substance Use Topics  . Alcohol use: Not Currently   FAMILY HX: No further family history attainable from patient or on chart review; no family present.    ALLERGIES: No Known Allergies   PERTINENT MEDICATIONS:  Outpatient Encounter Medications as of 01/08/2021  Medication Sig  . acetaminophen (TYLENOL) 500 MG tablet Take 500 mg by mouth every  4 (four) hours as needed for mild pain or fever.   Marland Kitchen alum & mag hydroxide-simeth (MAALOX/MYLANTA) 200-200-20 MG/5ML suspension Take 30 mLs by mouth 4 (four) times daily as needed for indigestion or heartburn.  Marland Kitchen aspirin 81 MG chewable tablet Chew by mouth daily.  . calcium carbonate (OSCAL) 1500 (600 Ca) MG TABS tablet Take 1 tablet by mouth daily.  . cholecalciferol (VITAMIN D3) 25 MCG (1000 UT) tablet Take 1,000 Units by mouth daily.  . divalproex (DEPAKOTE) 125 MG DR tablet Take 125 mg by mouth 2 (two) times daily.  . DULoxetine (CYMBALTA) 60 MG capsule Take 60 mg by mouth daily.  Marland Kitchen enoxaparin (LOVENOX) 40 MG/0.4ML injection Inject 0.4 mLs (40 mg total) into the skin daily.  . feeding supplement, ENSURE ENLIVE, (ENSURE ENLIVE) LIQD Take 237 mLs by mouth 2 (two) times daily between meals.  . lactulose, encephalopathy, (CHRONULAC) 10 GM/15ML SOLN Take 30 g by mouth daily.  Marland Kitchen lamoTRIgine (LAMICTAL) 25 MG tablet Take 1 tablet by mouth daily.  . magnesium hydroxide (MILK OF MAGNESIA) 400 MG/5ML suspension Take 30 mLs by mouth at bedtime as needed for mild constipation or moderate constipation.  . magnesium oxide (MAG-OX) 400 (241.3 Mg) MG tablet Take 1 tablet (400 mg total) by mouth 2 (two) times  daily.  . Menthol-Zinc Oxide 0.44-20.625 % OINT Apply 1 application topically 4 (four) times daily as needed (skin irritation).  . mirtazapine (REMERON) 30 MG tablet Take 30 mg by mouth daily.  . Multiple Vitamins-Minerals (MULTIVITAMIN ADULT, MINERALS,) TABS Take 1 tablet by mouth daily.  Marland Kitchen neomycin-bacitracin-polymyxin (NEOSPORIN) ointment Apply 1 application topically 4 (four) times daily as needed for wound care.  Marland Kitchen OLANZapine (ZYPREXA) 5 MG tablet Take 5 mg by mouth at bedtime.  Marland Kitchen omeprazole (PRILOSEC) 20 MG capsule Take 20 mg by mouth daily before breakfast.  . ondansetron (ZOFRAN-ODT) 4 MG disintegrating tablet Take 4 mg by mouth every 6 (six) hours as needed for nausea or vomiting.  . polyethylene  glycol (MIRALAX / GLYCOLAX) 17 g packet Take 17 g by mouth at bedtime.  . rivastigmine (EXELON) 6 MG capsule Take 6 mg by mouth 2 (two) times daily.  . rosuvastatin (CRESTOR) 10 MG tablet Take 10 mg by mouth daily.  Marland Kitchen senna-docusate (SENOKOT-S) 8.6-50 MG tablet Take 2 tablets by mouth 2 (two) times daily.  . traZODone (DESYREL) 50 MG tablet Take 50 mg by mouth at bedtime.   No facility-administered encounter medications on file as of 01/08/2021.     Thank you for the opportunity to participate in the care of Ms. Lopp.  The palliative care team will continue to follow. Please call our office at [phone] if we can be of additional assistance.   Jason Coop, NP , DNP, MPH, AGPCNP-BC, ACHPN  COVID-19 PATIENT SCREENING TOOL Asked and negative response unless otherwise noted:   Have you had symptoms of covid, tested positive or been in contact with someone with symptoms/positive test in the past 5-10 days?

## 2021-02-06 ENCOUNTER — Telehealth: Payer: Self-pay | Admitting: Primary Care

## 2021-02-06 NOTE — Telephone Encounter (Signed)
Tc to Thornton Dales, DSS POA. Message left to ask if she would want [place] to continue to follow patient in her new location as she has d/c from [facility].

## 2021-02-18 ENCOUNTER — Other Ambulatory Visit: Payer: Self-pay

## 2021-02-18 ENCOUNTER — Encounter: Payer: Self-pay | Admitting: Adult Health Nurse Practitioner

## 2021-02-18 ENCOUNTER — Non-Acute Institutional Stay: Payer: Medicare Other | Admitting: Adult Health Nurse Practitioner

## 2021-02-18 VITALS — HR 100 | Wt 117.0 lb

## 2021-02-18 DIAGNOSIS — Z515 Encounter for palliative care: Secondary | ICD-10-CM

## 2021-02-18 DIAGNOSIS — F0391 Unspecified dementia with behavioral disturbance: Secondary | ICD-10-CM

## 2021-02-18 DIAGNOSIS — G4700 Insomnia, unspecified: Secondary | ICD-10-CM

## 2021-02-18 NOTE — Progress Notes (Signed)
[facility] Consult Note Telephone: [phone]  Fax: [phone]    Date of encounter: 02/18/21 PATIENT NAME: [name] [address] [place] [address]   [phone] (home)  DOB: [birth year] MRN: [id number] PRIMARY CARE PROVIDER:    [facility],  [place] [address] [phone]  REFERRING PROVIDER:   [facility] [place] [address] [place],  [place] [address] [phone]  RESPONSIBLE PARTY:    Contact Information    Name Relation Home Work [place], [place] [phone]   Erline Levine [phone]  [phone]       I met face to face with patient in facility. Palliative Care was asked to follow this patient by consultation request of  [facility] to address advance care planning and complex medical decision making. This is a follow up visit.  Spoke with legal guardian via telephone to update on today's visit                                   [place] / RECOMMENDATIONS:   Advance Care Planning/Goals of Care: Goals include to maximize quality of life and symptom management. Our advance care planning conversation included a discussion about:    Confirmed with legal guardian that patient is a full code and not DNR as there were discrepancies in electronic medical record.  CODE STATUS: Full code  Symptom Management/Plan:  Dementia: Patient seems to be at her baseline.  She is able to make her needs known.  Patient's weight has improved as in March she was 107 pounds and she is currently at 116 pounds.  Continue supportive care at facility  Insomnia: The patient complains of insomnia she is reported by staff to be sleeping well on several occasions.  Discussed with patient today that she needs to be taking current dose of trazodone more consistently before having any  increase made as she does refuse it at some nights.  Did discuss with staff patient's request to have trazodone given at 10 PM it is currently documented as being given at bedtime.  Continue follow-up and recommendations by psych services.   Follow up Palliative Care Visit: Palliative care will continue to follow for complex medical decision making, advance care planning, and clarification of goals. Return 8-10 weeks or prn. Legal guardian encouraged to call with any questions or concerns  I spent 40 minutes providing this consultation. More than 50% of the time in this consultation was spent in counseling and care coordination.  PPS: 40%  HOSPICE ELIGIBILITY/DIAGNOSIS: TBD  Chief Complaint: Follow-up palliative visit  HISTORY OF PRESENT ILLNESS:  [name] is a 80 y.o. year old female  with dementia, bipolar, PAD, HLD, GERD.  Patient had 2 hospital stays in April 1 for UTI and the other for shortness of breath.  After this she went to [facility] for short-term rehab.  She is now back at [place].  Patient's main concern today is her insomnia and states that her trazodone needs to be increased.  Patient also states that her trazodone is offered to her at 6 at night and she refuses it when it they try to give it to her this early.  Is asking that it be given to her at 10:00 PM.  Did speak with staff who state that  her trazodone is to be given to her at bedtime.  Staff also reports that she frequently refuses a lot of her other meds as well.  Did speak with PCP who is at the facility today and she states that this is a common request of the patient to have her trazodone increased.  PCP also states that staff will state that patient will sleep well for couple of days but will still complain of insomnia.  PCP does state that she is being followed by psych services who helps manage this along with her bipolar.  Patient is wheelchair-bound and is working with PT currently.  She is able to  assist with transfers and requires standby assist with ADLs.  Patient denies pain, shortness of breath, cough, N/V/D, constipation, dysuria, hematuria.  Rest of 10 point ROS asked and negative except what is in HPI.  History obtained from review of EMR, discussion with primary team, and interview with legal guardian, facility staff and [name].    Physical Exam:  Constitutional: NAD General: WNWD EYES: anicteric sclera, lids intact, no discharge  ENMT: intact hearing, oral mucous membranes moist CV: S1S2, RRR, no LE edema Pulmonary: LCTA, no increased work of breathing, no cough Abdomen:  normo-active BS + 4 quadrants, soft and non tender MSK: moves all extremities, nonambulatory and is able to propel herself in wheelchair Skin: warm and dry, no rashes on visible skin Neuro:  A and O to person, place, able to tell month but not year Hem/lymph/immuno: no widespread bruising   Thank you for the opportunity to participate in the care of [name].  The palliative care team will continue to follow. Please call our office at [phone] if we can be of additional assistance.   Christen Wardrop Jenetta Downer, NP , DNP  This chart was dictated using voice recognition software. Despite best efforts to proofread, errors can occur which can change the documentation meaning.   COVID-19 PATIENT SCREENING TOOL Asked and negative response unless otherwise noted:   Have you had symptoms of covid, tested positive or been in contact with someone with symptoms/positive test in the past 5-10 days?

## 2021-03-04 ENCOUNTER — Telehealth: Payer: Self-pay

## 2021-03-04 NOTE — Telephone Encounter (Signed)
Received message to call Joyce Gross at [facility] re: concerns about patient refusing meds and verbalizing feelings of giving up. Spoke with Palliative NP who shared that she had spoken with patient's PCP today re: situation. Phone call placed to [facility] to f/u with Joyce Gross. Had to leave message for [facility].

## 2021-03-28 ENCOUNTER — Emergency Department: Payer: Medicare Other

## 2021-03-28 ENCOUNTER — Emergency Department
Admission: EM | Admit: 2021-03-28 | Discharge: 2021-03-29 | Disposition: A | Discharge status: Home / Self Care | Payer: Medicare Other | Attending: Emergency Medicine | Admitting: Emergency Medicine

## 2021-03-28 DIAGNOSIS — F0280 Dementia in other diseases classified elsewhere without behavioral disturbance: Secondary | ICD-10-CM | POA: Diagnosis not present

## 2021-03-28 DIAGNOSIS — G309 Alzheimer's disease, unspecified: Secondary | ICD-10-CM | POA: Diagnosis not present

## 2021-03-28 DIAGNOSIS — Z7982 Long term (current) use of aspirin: Secondary | ICD-10-CM | POA: Diagnosis not present

## 2021-03-28 DIAGNOSIS — Z87891 Personal history of nicotine dependence: Secondary | ICD-10-CM | POA: Diagnosis not present

## 2021-03-28 DIAGNOSIS — R42 Dizziness and giddiness: Secondary | ICD-10-CM | POA: Diagnosis not present

## 2021-03-28 DIAGNOSIS — Z79899 Other long term (current) drug therapy: Secondary | ICD-10-CM | POA: Diagnosis not present

## 2021-03-28 LAB — CBC WITH DIFFERENTIAL/PLATELET
Abs Immature Granulocytes: 0.02 10*3/uL (ref 0.00–0.07)
Basophils Absolute: 0 10*3/uL (ref 0.0–0.1)
Basophils Relative: 0 %
Eosinophils Absolute: 0.4 10*3/uL (ref 0.0–0.5)
Eosinophils Relative: 5 %
HCT: 41.1 % (ref 36.0–46.0)
Hemoglobin: 12.7 g/dL (ref 12.0–15.0)
Immature Granulocytes: 0 %
Lymphocytes Relative: 25 %
Lymphs Abs: 1.8 10*3/uL (ref 0.7–4.0)
MCH: 27.9 pg (ref 26.0–34.0)
MCHC: 30.9 g/dL (ref 30.0–36.0)
MCV: 90.1 fL (ref 80.0–100.0)
Monocytes Absolute: 0.6 10*3/uL (ref 0.1–1.0)
Monocytes Relative: 9 %
Neutro Abs: 4.5 10*3/uL (ref 1.7–7.7)
Neutrophils Relative %: 61 %
Platelets: 359 10*3/uL (ref 150–400)
RBC: 4.56 MIL/uL (ref 3.87–5.11)
RDW: 13.9 % (ref 11.5–15.5)
WBC: 7.4 10*3/uL (ref 4.0–10.5)
nRBC: 0 % (ref 0.0–0.2)

## 2021-03-28 LAB — URINALYSIS, COMPLETE (UACMP) WITH MICROSCOPIC
Bacteria, UA: NONE SEEN
Bilirubin Urine: NEGATIVE
Glucose, UA: 50 mg/dL — AB
Hgb urine dipstick: NEGATIVE
Ketones, ur: NEGATIVE mg/dL
Nitrite: NEGATIVE
Protein, ur: NEGATIVE mg/dL
Specific Gravity, Urine: 1.026 (ref 1.005–1.030)
pH: 5 (ref 5.0–8.0)

## 2021-03-28 LAB — MAGNESIUM: Magnesium: 1.2 mg/dL — ABNORMAL LOW (ref 1.7–2.4)

## 2021-03-28 LAB — BASIC METABOLIC PANEL
Anion gap: 7 (ref 5–15)
BUN: 25 mg/dL — ABNORMAL HIGH (ref 8–23)
CO2: 25 mmol/L (ref 22–32)
Calcium: 9.1 mg/dL (ref 8.9–10.3)
Chloride: 108 mmol/L (ref 98–111)
Creatinine, Ser: 0.68 mg/dL (ref 0.44–1.00)
GFR, Estimated: >60 mL/min (ref >60)
Glucose, Bld: 171 mg/dL — ABNORMAL HIGH (ref 70–99)
Potassium: 3.9 mmol/L (ref 3.5–5.1)
Sodium: 140 mmol/L (ref 135–145)

## 2021-03-28 MED ORDER — MAGNESIUM SULFATE 2 GM/50ML IV SOLN
2.0000 g | Freq: Once | INTRAVENOUS | Status: AC
  Administered 2021-03-28: 2 g via INTRAVENOUS
  Filled 2021-03-28: qty 50

## 2021-03-28 NOTE — ED Provider Notes (Signed)
[facility] Emergency Department Provider Note ____________________________________________   Event Date/Time   First MD Initiated Contact with Patient 03/28/21 [phone]     (approximate)  I have reviewed the triage vital signs and the nursing notes.  HISTORY  Chief Complaint Dizziness   HPI [name] is a 80 y.o. femalewho presents to the ED for evaluation of dizziness and falling sensation.   Chart review indicates hx dementia, bipolar disorder, HLD, GERD, chronic pain syndrome and PAD.  Patient presents today to the ED from her SNF for evaluation of dizziness and a sensation of falling.  She was reportedly outside of her SNF smoking a cigarette when staff noted that she had a odd look in her face, and told staff that she felt she was going to fall. Unknown when the symptoms started, she was reportedly normal last night, but she cannot tell me, due to her disorientation and dementia.  She reports that she continues to have a sensation of that she is falling towards the right, despite sitting comfortably supine in bed.  Patient denies pain anywhere and denies additional symptoms beyond dizziness and falling sensation.  History somewhat limited due to her disorientation and dementia  Past Medical History:  Diagnosis Date  . Alzheimer's dementia (HCC)   . Insomnia   . Low back pain   . Vomiting     Patient Active Problem List   Diagnosis Date Noted  . HCAP (healthcare-associated pneumonia) 12/19/2020  . PAD (peripheral artery disease) (HCC) 12/19/2020  . HLD (hyperlipidemia) 12/19/2020  . Insomnia 12/19/2020  . GERD (gastroesophageal reflux disease) 12/19/2020  . Bipolar disorder (HCC) 12/15/2020  . Dementia with behavioral disturbance (HCC) 12/15/2020  . SOB (shortness of breath) 12/12/2020  . Acute respiratory failure with hypoxia (HCC) 12/12/2020  . Community acquired pneumonia 12/12/2020  . Pressure injury of skin 09/25/2019  . Fall    . Closed left subtrochanteric femur fracture (HCC) 09/21/2019    Past Surgical History:  Procedure Laterality Date  . HIP ARTHROPLASTY Left 09/22/2019   Procedure: ARTHROPLASTY BIPOLAR HIP (HEMIARTHROPLASTY) RIGHT;  Surgeon: Christena FlakePoggi, John J, MD;  Location: [facility] ORS;  Service: Orthopedics;  Laterality: Left;  Marland Kitchen. VAGINAL HYSTERECTOMY      Prior to Admission medications   Medication Sig Start Date End Date Taking? Authorizing Provider  acetaminophen (TYLENOL) 500 MG tablet Take 500 mg by mouth every 4 (four) hours as needed for mild pain or fever.     [provider]  alum & mag hydroxide-simeth (MAALOX/MYLANTA) 200-200-20 MG/5ML suspension Take 30 mLs by mouth 4 (four) times daily as needed for indigestion or heartburn.    [provider]  aspirin 81 MG chewable tablet Chew by mouth daily.    [provider]  calcium carbonate (OSCAL) 1500 (600 Ca) MG TABS tablet Take 1 tablet by mouth daily. 12/05/20   [provider]  cholecalciferol (VITAMIN D3) 25 MCG (1000 UT) tablet Take 1,000 Units by mouth daily.    [provider]  divalproex (DEPAKOTE) 125 MG DR tablet Take 125 mg by mouth 2 (two) times daily.    [provider]  DULoxetine (CYMBALTA) 60 MG capsule Take 60 mg by mouth daily.    [provider]  enoxaparin (LOVENOX) 40 MG/0.4ML injection Inject 0.4 mLs (40 mg total) into the skin daily. 09/24/19   Anson OregonMcGhee, James Lance, PA-C  feeding supplement, ENSURE ENLIVE, (ENSURE ENLIVE) LIQD Take 237 mLs by mouth 2 (two) times daily between meals. 09/27/19  Rolly Salter, MD  lactulose, encephalopathy, (CHRONULAC) 10 GM/15ML SOLN Take 30 g by mouth daily.    [provider]  lamoTRIgine (LAMICTAL) 25 MG tablet Take 1 tablet by mouth daily.    [provider]  magnesium hydroxide (MILK OF MAGNESIA) 400 MG/5ML suspension Take 30 mLs by mouth at bedtime as needed for mild constipation or moderate constipation.    [provider]  magnesium oxide (MAG-OX) 400 (241.3 Mg) MG tablet Take 1 tablet (400 mg total) by mouth 2 (two) times daily. 12/25/20   Lynn Ito, MD  Menthol-Zinc Oxide 0.44-20.625 % OINT Apply 1 application topically 4 (four) times daily as needed (skin irritation).    [provider]  mirtazapine (REMERON) 30 MG tablet Take 30 mg by mouth daily.    [provider]  Multiple Vitamins-Minerals (MULTIVITAMIN ADULT, MINERALS,) TABS Take 1 tablet by mouth daily.    [provider]  neomycin-bacitracin-polymyxin (NEOSPORIN) ointment Apply 1 application topically 4 (four) times daily as needed for wound care.    [provider]  OLANZapine (ZYPREXA) 5 MG tablet Take 5 mg by mouth at bedtime.    [provider]  omeprazole (PRILOSEC) 20 MG capsule Take 20 mg by mouth daily before breakfast.    [provider]  ondansetron (ZOFRAN-ODT) 4 MG disintegrating tablet Take 4 mg by mouth every 6 (six) hours as needed for nausea or vomiting.    [provider]  polyethylene glycol (MIRALAX / GLYCOLAX) 17 g packet Take 17 g by mouth at bedtime.    [provider]  rivastigmine (EXELON) 6 MG capsule Take 6 mg by mouth 2 (two) times daily.    [provider]  rosuvastatin (CRESTOR) 10 MG tablet Take 10 mg by mouth daily.    [provider]  senna-docusate (SENOKOT-S) 8.6-50 MG tablet Take 2 tablets by mouth 2 (two) times daily.    [provider]  traZODone (DESYREL) 50 MG tablet Take 50 mg by mouth at bedtime.    [provider]    Allergies Patient has no known allergies.  No family history on file.  Social History Social History   Tobacco Use  . Smoking status: Former  . Smokeless tobacco: Never  Substance Use Topics  . Alcohol use: Not Currently  . Drug use: Not Currently    Review of Systems  Unable to be accurately assessed due to patient's disorientation and  dementia ____________________________________________   PHYSICAL EXAM:  VITAL SIGNS: Vitals:   03/28/21 1430 03/28/21 1504  BP:  109/65  Pulse: 62 (!) 59  Resp: 15 16  Temp:    SpO2: 98% 97%    Constitutional: Alert and pleasantly disoriented. Well appearing and in no acute distress. Eyes: Conjunctivae are normal. PERRL. EOMI. Head: Atraumatic. Nose: No congestion/rhinnorhea. Mouth/Throat: Mucous membranes are moist.  Oropharynx non-erythematous. Neck: No stridor. No cervical spine tenderness to palpation. Cardiovascular: Normal rate, regular rhythm. Grossly normal heart sounds.  Good peripheral circulation. Respiratory: Normal respiratory effort.  No retractions. Lungs CTAB. Gastrointestinal: Soft , nondistended, nontender to palpation. No CVA tenderness. Musculoskeletal: No lower extremity tenderness nor edema.  No joint effusions. No signs of acute trauma. Neurologic:  Normal speech and language.  Very mild asymmetry to finger-nose-finger with some dysmetria noted on the right, otherwise Cranial nerves II through XII intact 5/5 strength and sensation in all 4 extremities Skin:  Skin is warm, dry and intact. No rash noted. Psychiatric: Mood and affect are normal. Speech and  behavior are normal. ____________________________________________   LABS (all labs ordered are listed, but only abnormal results are displayed)  Labs Reviewed  BASIC METABOLIC PANEL - Abnormal; Notable for the following components:      Result Value   Glucose, Bld 171 (*)    BUN 25 (*)    All other components within normal limits  MAGNESIUM - Abnormal; Notable for the following components:   Magnesium 1.2 (*)    All other components within normal limits  CBC WITH DIFFERENTIAL/PLATELET  URINALYSIS, COMPLETE (UACMP) WITH MICROSCOPIC   ____________________________________________  12 Lead EKG  Sinus rhythm, rate of 67 bpm.  Normal axis and intervals.  No evidence of acute  ischemia. ____________________________________________  RADIOLOGY  ED MD interpretation: CT head reviewed by me without evidence of acute intracranial pathology.  Official radiology report(s): CT Head Wo Contrast  Result Date: 03/28/2021 CLINICAL DATA:  80 year old female with new dizziness. Unsteadiness. EXAM: CT HEAD WITHOUT CONTRAST TECHNIQUE: Contiguous axial images were obtained from the base of the skull through the vertex without intravenous contrast. COMPARISON:  Brain MRI 11/16/2006.  Head CT 02/03/2010. FINDINGS: Brain: Cerebral volume is not significantly changed since 2011. No midline shift, ventriculomegaly, mass effect, evidence of mass lesion, intracranial hemorrhage or evidence of cortically based acute infarction. Patchy bilateral periventricular white matter hypodensity has increased. No cortical encephalomalacia identified. Deep gray nuclei, brainstem and cerebellum appear normal for age. Vascular: Calcified atherosclerosis at the skull base. No suspicious intracranial vascular hyperdensity. Skull: No acute osseous abnormality identified. Sinuses/Orbits: Mild sinus mucoperiosteal thickening. Mild bubbly opacity and mucosal thickening in the sphenoid sinuses. Tympanic cavities and mastoids remain clear. Other: Visualized orbits and scalp soft tissues are within normal limits. IMPRESSION: 1. No acute intracranial abnormality. 2. Increased cerebral white matter changes since 2011 most commonly due to chronic small vessel disease. 3. Mild paranasal sinus inflammation, significance doubtful. Electronically Signed   By: Odessa Fleming M.D.   On: 03/28/2021 10:36   MR BRAIN WO CONTRAST  Result Date: 03/28/2021 CLINICAL DATA:  New dysmetria and following toward right. Rule out CVA EXAM: MRI HEAD WITHOUT CONTRAST TECHNIQUE: Multiplanar, multiecho pulse sequences of the brain and surrounding structures were obtained without intravenous contrast. COMPARISON:  CT head 03/28/2021 FINDINGS: Brain:  Generalized atrophy. Negative for hydrocephalus. Moderate white matter changes with periventricular deep white matter hyperintensity bilaterally. Mild hyperintensity in the pons. Negative for acute infarct, hemorrhage, mass Vascular: Normal arterial flow voids Skull and upper cervical spine: Negative Sinuses/Orbits: Mild mucosal edema paranasal sinuses. Negative orbit Other: None IMPRESSION: Negative for acute infarct Atrophy with chronic microvascular ischemic change. Electronically Signed   By: Marlan Palau M.D.   On: 03/28/2021 14:08    ____________________________________________   PROCEDURES and INTERVENTIONS  Procedure(s) performed (including Critical Care):  .1-3 Lead EKG Interpretation  Date/Time: 03/28/2021 12:04 PM Performed by: Delton Prairie, MD Authorized by: Delton Prairie, MD     Interpretation: normal     ECG rate:  64   ECG rate assessment: normal     Rhythm: sinus rhythm     Ectopy: none     Conduction: normal    Medications  magnesium sulfate IVPB 2 g 50 mL (0 g Intravenous Stopped 03/28/21 1259)    ____________________________________________   MDM / ED COURSE   Pleasantly demented 80 year old woman presents from her local SNF with a sensation of dizziness, with evidence of mild hypomagnesemia, but no evidence of acute CVA.  Normal vitals.  Exam is largely reassuring without signs of neurologic or  vascular deficits.  Initially, she did seem to have some very mild dysmetria on right-sided finger-nose-finger, otherwise no deficits.  Upon my reexamination this seems to have improved.  Blood work with mild low magnesium, thus repleted IV, otherwise unremarkable.  MRI obtained without evidence of acute CVA and CT scan demonstrates no evidence of acute ICH.  Patient signed out to oncoming divider to follow-up on urinalysis to ensure no evidence of acute cystitis contributing to her dizziness symptoms.  Anticipate discharge to her SNF thereafter.  Clinical Course as of  03/28/21 1528  Sat Mar 28, 2021  1526 Reassessed.  Patient looks well.  We discussed reassuring MRI results and need for urinalysis.  She is agreeable. [DS]    Clinical Course User Index [DS] Delton Prairie, MD    ____________________________________________   FINAL CLINICAL IMPRESSION(S) / ED DIAGNOSES  Final diagnoses:  Hypomagnesemia  Dizziness     ED Discharge Orders     None        Leatrice Parilla Katrinka Blazing   Note:  This document was prepared using Dragon voice recognition software and may include unintentional dictation errors.    Delton Prairie, MD 03/28/21 [phone]

## 2021-03-28 NOTE — ED Triage Notes (Signed)
Pt BIB [facility] from [facility]. Per EMS, pt was outside smoking cigarette and staff reports "a funny look on her face." During triage, pt repeatedly stating, "I feel like I'm gonna fall." No other medical complaints. Hx dementia.

## 2021-03-28 NOTE — ED Notes (Signed)
Spoke to [employer]   POA to alert them of immenent dc.

## 2021-03-28 NOTE — ED Provider Notes (Signed)
-----------------------------------------   10:40 PM on 03/28/2021 ----------------------------------------- Urinalysis shows no concerning findings.  Patient will be discharged back to her nursing facility.   Minna Antis, MD 03/28/21 2240

## 2021-03-28 NOTE — ED Notes (Signed)
Attempted straight cath without success. Pt tolerated poorly. PureWick placed on pt at this time for urine sample.

## 2021-03-28 NOTE — ED Notes (Signed)
Report called to [facility] spoke to [employer]

## 2021-03-28 NOTE — ED Notes (Signed)
Pt transported to CT via stretcher at this time.  

## 2021-03-29 NOTE — ED Notes (Signed)
Ems arrived, put dry brief on and placed wet pants in bag.

## 2021-03-29 NOTE — ED Notes (Signed)
Checked on pt. Asked if she needed a new brief and pt said no. Brought her a new warm blanket.

## 2021-04-24 ENCOUNTER — Ambulatory Visit: Payer: Self-pay | Admitting: Podiatry

## 2021-05-26 ENCOUNTER — Ambulatory Visit: Payer: Medicare Other | Admitting: Podiatry

## 2022-07-21 ENCOUNTER — Non-Acute Institutional Stay: Payer: Medicare Other | Admitting: Student

## 2022-07-21 DIAGNOSIS — F02818 Dementia in other diseases classified elsewhere, unspecified severity, with other behavioral disturbance: Secondary | ICD-10-CM

## 2022-07-21 DIAGNOSIS — Z515 Encounter for palliative care: Secondary | ICD-10-CM

## 2022-07-21 DIAGNOSIS — R634 Abnormal weight loss: Secondary | ICD-10-CM

## 2022-07-21 DIAGNOSIS — F319 Bipolar disorder, unspecified: Secondary | ICD-10-CM

## 2022-07-26 NOTE — Progress Notes (Signed)
[facility] Pall[facility] Consult Note Telephone: [phone]  Fax: [phone]   Date of encounter: 07/21/22 11:00PM PATIENT NAME: [name] [place] [address]   [phone] (home)  DOB: 08-Feb-1941 MRN: [id number] PRIMARY CARE PROVIDER:    [facility],  [name] [id number] [phone]  REFERRING PROVIDER:   [facility] [place] [name],  [name] [id number] [phone]  RESPONSIBLE PARTY:    Contact Information     Name Relation Home Work [place]  [phone]   [name] [phone]  [phone]        I met face to face with patient in the facility. Palliative Care was asked to follow this patient by consultation request of  [facility] to address advance care planning and complex medical decision making. This is the initial visit.                                     ASSESSMENT AND PLAN / RECOMMENDATIONS:   Advance Care Planning/Goals of Care: Goals include to maximize quality of life and symptom management. Patient/health care surrogate gave his/her permission to discuss.Our advance care planning conversation included a discussion about:    The value and importance of advance care planning  Experiences with loved ones who have been seriously ill or have died  Exploration of personal, cultural or spiritual beliefs that might influence medical decisions  Exploration of goals of care in the event of a sudden injury or illness  CODE STATUS: DNR  Education provided on Palliative medicine. She recently discharged from hospice d/t stability. Will monitor for further changes/declines, weight loss. Will refer back to hospice when she meets eligibility requirements. Reached out to patient's DSS guardian; awaiting response.   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 Reorient and redirect as needed. Monitor for cognitive and functional declines. Monitor for falls/safety. Monitor for worsening behaviors; continue medications as directed below. Weight loss-Monitor for weight loss. She is currently eating good. Routine weights per facility. Current weight 111.5; which down 3.5 pounds prior to hospice discharge.  Bipolar-monitor for worsening. Patient has been taking medications. Continue sertraline, mirtazapine, Lamictal, trazodone, olanzapine and clonazepam as directed.   Follow up Palliative Care Visit: Palliative care will continue to follow for complex medical decision making, advance care planning, and clarification of goals. Return in 4 weeks or prn.   This visit was coded based on medical decision making (MDM).  PPS: 30%  HOSPICE ELIGIBILITY/DIAGNOSIS: TBD  Chief Complaint: Palliative Medicine initial visit.   HISTORY OF PRESENT ILLNESS:  [name] is a 81 y.o. year old female  with mixed Alzheimer's and vascular dementia, bipolar disorder, anxiety, PAD, left hip fracture with ORIF 10/02/20, hyperlipidemia, anemia, insomnia, cachexia, GERD, right lower lobe nodule, severe protein calorie malnutrition, weight loss, hypoalbuminemia , history of smoking, AFTT. Hx of stage 3 wound to left hip and unstageable wound to right ankle.  Patient resides at [facility] on locked memory unit. She was discharged from hospice services on 07/16/22 due to stability, weight gain. Patient was 115 pounds; she is 111.5 pounds today. She requires assistance with all adl's. Staff report episodes of depression where she will sleep for extended periods with manic episodes. She is eating usually two  good meals a day; will sometimes eat two plates. She is up to w/c most days. Patient with agitation, anxiety. She is sometimes resistive to care; she has been taking her medications regularly. Requires assist x  1 for transfers. No recent falls reported. Patient received sitting in common area. She does answer direct questions. She first declines physical assessment. She did allow NP to assess lung and abdomen after much direction for med tech.   History obtained from review of EMR, discussion with primary team, and interview with family, facility staff/caregiver and/or [name].  I reviewed available labs, medications, imaging, studies and related documents from the EMR.  Records reviewed and summarized above.   ROS  Staff contributes d/t her dementia.   Physical Exam: Weight: 111.5 pounds today.  Constitutional: NAD General: frail appearing, thin EYES: anicteric sclera, lids intact, no discharge  ENMT: intact hearing, oral mucous membranes moist CV: S1S2, RRR, no LE edema Pulmonary: LCTA, no increased work of breathing, no cough, room air Abdomen: normo-active BS + 4 quadrants, soft and non tender GU: deferred MSK: + sarcopenia, moves all extremities Skin: warm and dry, no rashes or wounds on visible skin Neuro: + generalized weakness,  +cognitive impairment Psych: non-anxious affect, A and O to person Hem/lymph/immuno: no widespread bruising CURRENT PROBLEM LIST:  Patient Active Problem List   Diagnosis Date Noted   HCAP (healthcare-associated pneumonia) 12/19/2020   PAD (peripheral artery disease) ([place]) 12/19/2020   HLD (hyperlipidemia) 12/19/2020   Insomnia 12/19/2020   GERD (gastroesophageal reflux disease) 12/19/2020   Bipolar disorder ([place]) 12/15/2020   Dementia with behavioral disturbance ([place]) 12/15/2020   SOB (shortness of breath) 12/12/2020   Acute respiratory failure with hypoxia ([place]) 12/12/2020   Community acquired pneumonia 12/12/2020   Pressure injury of skin 09/25/2019   Fall    Closed left subtrochanteric femur fracture ([place]) 09/21/2019   PAST MEDICAL HISTORY:  Active Ambulatory Problems    Diagnosis Date Noted   Closed left subtrochanteric femur fracture  ([place]) 09/21/2019   Fall    Pressure injury of skin 09/25/2019   SOB (shortness of breath) 12/12/2020   Acute respiratory failure with hypoxia ([place]) 12/12/2020   Community acquired pneumonia 12/12/2020   Bipolar disorder ([place]) 12/15/2020   Dementia with behavioral disturbance ([place]) 12/15/2020   HCAP (healthcare-associated pneumonia) 12/19/2020   PAD (peripheral artery disease) ([place]) 12/19/2020   HLD (hyperlipidemia) 12/19/2020   Insomnia 12/19/2020   GERD (gastroesophageal reflux disease) 12/19/2020   Resolved Ambulatory Problems    Diagnosis Date Noted   No Resolved Ambulatory Problems   Past Medical History:  Diagnosis Date   Alzheimer's dementia ([place])    Low back pain    Vomiting    SOCIAL HX:  Social History   Tobacco Use   Smoking status: Former   Smokeless tobacco: Never  Substance Use Topics   Alcohol use: Not Currently   FAMILY HX: No family history on file.    ALLERGIES: No Known Allergies   PERTINENT MEDICATIONS:  Outpatient Encounter Medications as of 07/21/2022  Medication Sig   acetaminophen (TYLENOL) 500 MG tablet Take 500 mg by mouth every 4 (four) hours as needed for mild pain or fever.    alum & mag hydroxide-simeth (MAALOX/MYLANTA) 200-200-20 MG/5ML suspension Take 30 mLs by mouth 4 (four) times daily as needed for indigestion or heartburn.   aspirin 81 MG chewable tablet Chew by mouth daily.   calcium carbonate (OSCAL) 1500 (600 Ca) MG TABS tablet Take 1 tablet by mouth daily.  cholecalciferol (VITAMIN D3) 25 MCG (1000 UT) tablet Take 1,000 Units by mouth daily.   divalproex (DEPAKOTE) 125 MG DR tablet Take 125 mg by mouth 2 (two) times daily.   DULoxetine (CYMBALTA) 60 MG capsule Take 60 mg by mouth daily.   enoxaparin (LOVENOX) 40 MG/0.4ML injection Inject 0.4 mLs (40 mg total) into the skin daily.   feeding supplement, ENSURE ENLIVE, (ENSURE ENLIVE) LIQD Take 237 mLs by mouth 2 (two) times daily between meals.   lactulose, encephalopathy,  (CHRONULAC) 10 GM/15ML SOLN Take 30 g by mouth daily.   lamoTRIgine (LAMICTAL) 25 MG tablet Take 1 tablet by mouth daily.   magnesium hydroxide (MILK OF MAGNESIA) 400 MG/5ML suspension Take 30 mLs by mouth at bedtime as needed for mild constipation or moderate constipation.   magnesium oxide (MAG-OX) 400 (241.3 Mg) MG tablet Take 1 tablet (400 mg total) by mouth 2 (two) times daily.   Menthol-Zinc Oxide 0.44-20.625 % OINT Apply 1 application topically 4 (four) times daily as needed (skin irritation).   mirtazapine (REMERON) 30 MG tablet Take 30 mg by mouth daily.   Multiple Vitamins-Minerals (MULTIVITAMIN ADULT, MINERALS,) TABS Take 1 tablet by mouth daily.   neomycin-bacitracin-polymyxin (NEOSPORIN) ointment Apply 1 application topically 4 (four) times daily as needed for wound care.   OLANZapine (ZYPREXA) 5 MG tablet Take 5 mg by mouth at bedtime.   omeprazole (PRILOSEC) 20 MG capsule Take 20 mg by mouth daily before breakfast.   ondansetron (ZOFRAN-ODT) 4 MG disintegrating tablet Take 4 mg by mouth every 6 (six) hours as needed for nausea or vomiting.   polyethylene glycol (MIRALAX / GLYCOLAX) 17 g packet Take 17 g by mouth at bedtime.   rivastigmine (EXELON) 6 MG capsule Take 6 mg by mouth 2 (two) times daily.   rosuvastatin (CRESTOR) 10 MG tablet Take 10 mg by mouth daily.   senna-docusate (SENOKOT-S) 8.6-50 MG tablet Take 2 tablets by mouth 2 (two) times daily.   traZODone (DESYREL) 50 MG tablet Take 50 mg by mouth at bedtime.   No facility-administered encounter medications on file as of 07/21/2022.   Thank you for the opportunity to participate in the care of [name].  The palliative care team will continue to follow. Please call our office at [phone] if we can be of additional assistance.   Ezekiel Slocumb, NP   COVID-19 PATIENT SCREENING TOOL Asked and negative response unless otherwise noted:  Have you had symptoms of covid, tested positive or been in contact with someone  with symptoms/positive test in the past 5-10 days? No

## 2022-08-30 ENCOUNTER — Non-Acute Institutional Stay: Payer: Medicare Other | Admitting: Hospice

## 2022-08-30 DIAGNOSIS — Z515 Encounter for palliative care: Secondary | ICD-10-CM

## 2022-08-30 DIAGNOSIS — F319 Bipolar disorder, unspecified: Secondary | ICD-10-CM

## 2022-08-30 DIAGNOSIS — G4700 Insomnia, unspecified: Secondary | ICD-10-CM

## 2022-08-30 DIAGNOSIS — F03918 Unspecified dementia, unspecified severity, with other behavioral disturbance: Secondary | ICD-10-CM

## 2022-08-30 NOTE — Progress Notes (Signed)
[facility] Pall[facility] Consult Note Telephone: [phone]  Fax: [phone]   Date of encounter: 07/21/22 11:00PM PATIENT NAME: [name] [place] [address]   [phone] (home)  DOB: 1940/12/10 MRN: [id number] PRIMARY CARE PROVIDER:    Thea Gist FNP  REFERRING PROVIDER:   [facility], [address] [place],  [place] [address] [phone]  RESPONSIBLE PARTY:    Contact Information     Name Relation Home Work Birchwood [name]  [phone]   Erline Levine [phone]  [phone]        I met face to face with patient in the facility. Palliative Care was asked to follow this patient to address advance care planning and complex medical decision making. This a follow up visit.                                     ASSESSMENT AND PLAN / RECOMMENDATIONS:   Goals of Care: Goals include to maximize quality of life and symptom management.  Patient was recently discharged from hospice due to stability. CODE STATUS: DNR  Symptom Management/Plan: Dementia with behavioral disturbance: Memory loss and confusion is progressing in line with dementia disease trajectory.  FAST 7A.  Continue ongoing supportive care.  Fall/safety precautions. Agitation: Managed with clonazepam Bipolar-managed with Lamictal, Olanzapine Insomnia: Managed with Melatonin, Trazodone.  Routine CBC CMP Follow up Palliative Care Visit: Palliative care will continue to follow for complex medical decision making, advance care planning, and clarification of goals. Return in 4 weeks or prn.  PPS: 30%  HOSPICE ELIGIBILITY/DIAGNOSIS: TBD  Chief Complaint: Palliative Medicine initial visit.   HISTORY OF PRESENT ILLNESS:  [name] is a 81 y.o. year old female  with multiple comorbidities requiring close monitoring, with high risk of complications, mortality: Advanced  dementia, bipolar disorder, anxiety, PAD.  History of left hip fracture with ORIF 10/02/20, hyperlipidemia, anemia, insomnia, cachexia, GERD, right lower lobe nodule, severe protein calorie malnutrition, weight loss, hypoalbuminemia , history of smoking, History obtained from review of EMR, discussion with primary team, and interview with family, facility staff/caregiver and/or Ms. Samaan.  I reviewed available labs, medications, imaging, studies and related documents from the EMR.  Records reviewed and summarized above.    CURRENT PROBLEM LIST:  Patient Active Problem List   Diagnosis Date Noted   HCAP (healthcare-associated pneumonia) 12/19/2020   PAD (peripheral artery disease) ([place]) 12/19/2020   HLD (hyperlipidemia) 12/19/2020   Insomnia 12/19/2020   GERD (gastroesophageal reflux disease) 12/19/2020   Bipolar disorder ([place]) 12/15/2020   Dementia with behavioral disturbance ([place]) 12/15/2020   SOB (shortness of breath) 12/12/2020   Acute respiratory failure with hypoxia ([place]) 12/12/2020   Community acquired pneumonia 12/12/2020   Pressure injury of skin 09/25/2019   Fall    Closed left subtrochanteric femur fracture ([place]) 09/21/2019   PAST MEDICAL HISTORY:  Active Ambulatory Problems    Diagnosis Date Noted   Closed left subtrochanteric femur fracture ([place]) 09/21/2019   Fall    Pressure injury of skin 09/25/2019   SOB (shortness of breath) 12/12/2020   Acute respiratory failure with hypoxia ([place]) 12/12/2020   Community acquired pneumonia 12/12/2020   Bipolar disorder ([place]) 12/15/2020   Dementia with behavioral disturbance ([place]) 12/15/2020   HCAP (healthcare-associated pneumonia) 12/19/2020   PAD (peripheral artery disease) ([place]) 12/19/2020   HLD (hyperlipidemia) 12/19/2020  Insomnia 12/19/2020   GERD (gastroesophageal reflux disease) 12/19/2020   Resolved Ambulatory Problems    Diagnosis Date Noted   No Resolved Ambulatory Problems   Past Medical History:  Diagnosis  Date   Alzheimer's dementia ([place])    Low back pain    Vomiting    SOCIAL HX:  Social History   Tobacco Use   Smoking status: Former   Smokeless tobacco: Never  Substance Use Topics   Alcohol use: Not Currently   FAMILY HX: No family history on file.    ALLERGIES: No Known Allergies   PERTINENT MEDICATIONS:  Outpatient Encounter Medications as of 08/30/2022  Medication Sig   acetaminophen (TYLENOL) 500 MG tablet Take 500 mg by mouth every 4 (four) hours as needed for mild pain or fever.    alum & mag hydroxide-simeth (MAALOX/MYLANTA) 200-200-20 MG/5ML suspension Take 30 mLs by mouth 4 (four) times daily as needed for indigestion or heartburn.   aspirin 81 MG chewable tablet Chew by mouth daily.   calcium carbonate (OSCAL) 1500 (600 Ca) MG TABS tablet Take 1 tablet by mouth daily.   cholecalciferol (VITAMIN D3) 25 MCG (1000 UT) tablet Take 1,000 Units by mouth daily.   divalproex (DEPAKOTE) 125 MG DR tablet Take 125 mg by mouth 2 (two) times daily.   DULoxetine (CYMBALTA) 60 MG capsule Take 60 mg by mouth daily.   enoxaparin (LOVENOX) 40 MG/0.4ML injection Inject 0.4 mLs (40 mg total) into the skin daily.   feeding supplement, ENSURE ENLIVE, (ENSURE ENLIVE) LIQD Take 237 mLs by mouth 2 (two) times daily between meals.   lactulose, encephalopathy, (CHRONULAC) 10 GM/15ML SOLN Take 30 g by mouth daily.   lamoTRIgine (LAMICTAL) 25 MG tablet Take 1 tablet by mouth daily.   magnesium hydroxide (MILK OF MAGNESIA) 400 MG/5ML suspension Take 30 mLs by mouth at bedtime as needed for mild constipation or moderate constipation.   magnesium oxide (MAG-OX) 400 (241.3 Mg) MG tablet Take 1 tablet (400 mg total) by mouth 2 (two) times daily.   Menthol-Zinc Oxide 0.44-20.625 % OINT Apply 1 application topically 4 (four) times daily as needed (skin irritation).   mirtazapine (REMERON) 30 MG tablet Take 30 mg by mouth daily.   Multiple Vitamins-Minerals (MULTIVITAMIN ADULT, MINERALS,) TABS Take 1 tablet  by mouth daily.   neomycin-bacitracin-polymyxin (NEOSPORIN) ointment Apply 1 application topically 4 (four) times daily as needed for wound care.   OLANZapine (ZYPREXA) 5 MG tablet Take 5 mg by mouth at bedtime.   omeprazole (PRILOSEC) 20 MG capsule Take 20 mg by mouth daily before breakfast.   ondansetron (ZOFRAN-ODT) 4 MG disintegrating tablet Take 4 mg by mouth every 6 (six) hours as needed for nausea or vomiting.   polyethylene glycol (MIRALAX / GLYCOLAX) 17 g packet Take 17 g by mouth at bedtime.   rivastigmine (EXELON) 6 MG capsule Take 6 mg by mouth 2 (two) times daily.   rosuvastatin (CRESTOR) 10 MG tablet Take 10 mg by mouth daily.   senna-docusate (SENOKOT-S) 8.6-50 MG tablet Take 2 tablets by mouth 2 (two) times daily.   traZODone (DESYREL) 50 MG tablet Take 50 mg by mouth at bedtime.   No facility-administered encounter medications on file as of 08/30/2022.  I spent 45 minutes providing this consultation; time includes spent with patient/family, chart review and documentation. More than 50% of the time in this consultation was spent on care coordination.   Thank you for the opportunity to participate in the care of Ms. Oguinn.  The palliative care  team will continue to follow. Please call our office at [phone] if we can be of additional assistance.   Teodoro Spray, NP

## 2022-10-01 ENCOUNTER — Non-Acute Institutional Stay: Payer: Medicare Other | Admitting: Hospice

## 2022-10-01 DIAGNOSIS — F03918 Unspecified dementia, unspecified severity, with other behavioral disturbance: Secondary | ICD-10-CM

## 2022-10-01 DIAGNOSIS — Z515 Encounter for palliative care: Secondary | ICD-10-CM

## 2022-10-01 DIAGNOSIS — F319 Bipolar disorder, unspecified: Secondary | ICD-10-CM

## 2022-10-01 DIAGNOSIS — G4700 Insomnia, unspecified: Secondary | ICD-10-CM

## 2022-10-01 DIAGNOSIS — F419 Anxiety disorder, unspecified: Secondary | ICD-10-CM

## 2022-10-01 NOTE — Progress Notes (Signed)
[facility] Consult Note Telephone: [phone]  Fax: [phone]   PATIENT NAME: [name] [address] [place] [address]   [phone] (home)  DOB: 05-Dec-1940 MRN: [id number] PRIMARY CARE PROVIDER:    Thea Gist FNP  REFERRING PROVIDER:   [facility] [address] [place],  [place] [address] [phone]  RESPONSIBLE PARTY:    Contact Information     Name Relation Home Work [place]igold  [phone]   Erline Levine [phone]  [phone]        I met face to face with patient in the facility. Palliative Care was asked to follow this patient to address advance care planning and complex medical decision making. This a follow up visit.                                     ASSESSMENT AND PLAN / RECOMMENDATIONS:   Goals of Care: Goals include to maximize quality of life and symptom management.  Patient was recently discharged from hospice due to stability. CODE STATUS: DNR  Patient was a hospice patient previously; discharged for stability.  Symptom Management/Plan: Dementia with behavioral disturbance: Memory loss and confusion  at baseline.   FAST 7A.  Continue ongoing supportive care.  Fall/safety precautions. Anxiety: Continue clonazepam. Deescalation techniques, calm approach; Psych consult as planned.  Bipolar-managed with Lamictal, Olanzapine. Psych following.  Insomnia: Managed with Melatonin, Trazodone.  Routine CBC CMP Follow up Palliative Care Visit: Palliative care will continue to follow for complex medical decision making, advance care planning, and clarification of goals. Return in 4 weeks or prn.  PPS: 30%  HOSPICE ELIGIBILITY/DIAGNOSIS: TBD  Chief Complaint: Palliative Medicine f/u visit.   HISTORY OF PRESENT ILLNESS:  [name] is a 82 y.o. year old female  with multiple comorbidities requiring close  monitoring, with high risk of complications, mortality: Advanced dementia, bipolar disorder, anxiety, PAD.  History of left hip fracture with ORIF 10/02/20, hyperlipidemia, anemia, insomnia, cachexia, GERD, right lower lobe nodule, severe protein calorie malnutrition, weight loss, hypoalbuminemia , history of smoking. Patient denies pain/discomfort, FLACC 0. Rest of 10 point ROS asked and negative.  History obtained from review of EMR, discussion with primary team, and interview with family, facility staff/caregiver and/or Ms. George.  I reviewed available labs, medications, imaging, studies and related documents from the EMR.  Records reviewed and summarized above.    CURRENT PROBLEM LIST:  Patient Active Problem List   Diagnosis Date Noted   HCAP (healthcare-associated pneumonia) 12/19/2020   PAD (peripheral artery disease) ([place]) 12/19/2020   HLD (hyperlipidemia) 12/19/2020   Insomnia 12/19/2020   GERD (gastroesophageal reflux disease) 12/19/2020   Bipolar disorder ([place]) 12/15/2020   Dementia with behavioral disturbance ([place]) 12/15/2020   SOB (shortness of breath) 12/12/2020   Acute respiratory failure with hypoxia ([place]) 12/12/2020   Community acquired pneumonia 12/12/2020   Pressure injury of skin 09/25/2019   Fall    Closed left subtrochanteric femur fracture ([place]) 09/21/2019   PAST MEDICAL HISTORY:  Active Ambulatory Problems    Diagnosis Date Noted   Closed left subtrochanteric femur fracture ([place]) 09/21/2019   Fall    Pressure injury of skin 09/25/2019   SOB (shortness of breath) 12/12/2020   Acute respiratory failure with hypoxia ([place]) 12/12/2020   Community acquired pneumonia 12/12/2020   Bipolar disorder ([place]) 12/15/2020  Dementia with behavioral disturbance ([place]) 12/15/2020   HCAP (healthcare-associated pneumonia) 12/19/2020   PAD (peripheral artery disease) ([place]) 12/19/2020   HLD (hyperlipidemia) 12/19/2020   Insomnia 12/19/2020   GERD (gastroesophageal reflux  disease) 12/19/2020   Resolved Ambulatory Problems    Diagnosis Date Noted   No Resolved Ambulatory Problems   Past Medical History:  Diagnosis Date   Alzheimer's dementia ([place])    Low back pain    Vomiting    SOCIAL HX:  Social History   Tobacco Use   Smoking status: Former   Smokeless tobacco: Never  Substance Use Topics   Alcohol use: Not Currently   FAMILY HX: No family history on file.    ALLERGIES: No Known Allergies   PERTINENT MEDICATIONS:  Outpatient Encounter Medications as of 10/01/2022  Medication Sig   acetaminophen (TYLENOL) 500 MG tablet Take 500 mg by mouth every 4 (four) hours as needed for mild pain or fever.    alum & mag hydroxide-simeth (MAALOX/MYLANTA) 200-200-20 MG/5ML suspension Take 30 mLs by mouth 4 (four) times daily as needed for indigestion or heartburn.   aspirin 81 MG chewable tablet Chew by mouth daily.   calcium carbonate (OSCAL) 1500 (600 Ca) MG TABS tablet Take 1 tablet by mouth daily.   cholecalciferol (VITAMIN D3) 25 MCG (1000 UT) tablet Take 1,000 Units by mouth daily.   divalproex (DEPAKOTE) 125 MG DR tablet Take 125 mg by mouth 2 (two) times daily.   DULoxetine (CYMBALTA) 60 MG capsule Take 60 mg by mouth daily.   enoxaparin (LOVENOX) 40 MG/0.4ML injection Inject 0.4 mLs (40 mg total) into the skin daily.   feeding supplement, ENSURE ENLIVE, (ENSURE ENLIVE) LIQD Take 237 mLs by mouth 2 (two) times daily between meals.   lactulose, encephalopathy, (CHRONULAC) 10 GM/15ML SOLN Take 30 g by mouth daily.   lamoTRIgine (LAMICTAL) 25 MG tablet Take 1 tablet by mouth daily.   magnesium hydroxide (MILK OF MAGNESIA) 400 MG/5ML suspension Take 30 mLs by mouth at bedtime as needed for mild constipation or moderate constipation.   magnesium oxide (MAG-OX) 400 (241.3 Mg) MG tablet Take 1 tablet (400 mg total) by mouth 2 (two) times daily.   Menthol-Zinc Oxide 0.44-20.625 % OINT Apply 1 application topically 4 (four) times daily as needed (skin  irritation).   mirtazapine (REMERON) 30 MG tablet Take 30 mg by mouth daily.   Multiple Vitamins-Minerals (MULTIVITAMIN ADULT, MINERALS,) TABS Take 1 tablet by mouth daily.   neomycin-bacitracin-polymyxin (NEOSPORIN) ointment Apply 1 application topically 4 (four) times daily as needed for wound care.   OLANZapine (ZYPREXA) 5 MG tablet Take 5 mg by mouth at bedtime.   omeprazole (PRILOSEC) 20 MG capsule Take 20 mg by mouth daily before breakfast.   ondansetron (ZOFRAN-ODT) 4 MG disintegrating tablet Take 4 mg by mouth every 6 (six) hours as needed for nausea or vomiting.   polyethylene glycol (MIRALAX / GLYCOLAX) 17 g packet Take 17 g by mouth at bedtime.   rivastigmine (EXELON) 6 MG capsule Take 6 mg by mouth 2 (two) times daily.   rosuvastatin (CRESTOR) 10 MG tablet Take 10 mg by mouth daily.   senna-docusate (SENOKOT-S) 8.6-50 MG tablet Take 2 tablets by mouth 2 (two) times daily.   traZODone (DESYREL) 50 MG tablet Take 50 mg by mouth at bedtime.   No facility-administered encounter medications on file as of 10/01/2022.   I spent 45 minutes providing this consultation; time includes spent with patient/family, chart review and documentation. More than 50% of the  time in this consultation was spent on care coordination.   Thank you for the opportunity to participate in the care of Ms. Yasui.  The palliative care team will continue to follow. Please call our office at [phone] if we can be of additional assistance.   Teodoro Spray, NP

## 2022-11-16 ENCOUNTER — Non-Acute Institutional Stay: Payer: Medicare Other | Admitting: Hospice

## 2022-11-16 DIAGNOSIS — Z515 Encounter for palliative care: Secondary | ICD-10-CM

## 2022-11-16 DIAGNOSIS — G4700 Insomnia, unspecified: Secondary | ICD-10-CM

## 2022-11-16 DIAGNOSIS — F319 Bipolar disorder, unspecified: Secondary | ICD-10-CM

## 2022-11-16 DIAGNOSIS — F419 Anxiety disorder, unspecified: Secondary | ICD-10-CM

## 2022-11-16 DIAGNOSIS — F03918 Unspecified dementia, unspecified severity, with other behavioral disturbance: Secondary | ICD-10-CM

## 2022-11-16 NOTE — Progress Notes (Signed)
[facility] Consult Note Telephone: [phone]  Fax: [phone]   PATIENT NAME: [name] [address] [place] [address]   [phone] (home)  DOB: 05-Dec-1940 MRN: [id number] PRIMARY CARE PROVIDER:    Thea Gist FNP  REFERRING PROVIDER:   [facility], [address] [place],  [place] [address] [phone]  RESPONSIBLE PARTY:    Contact Information     Name Relation Home Work Merigold  [phone]   Erline Levine [phone]  [phone]        I met face to face with patient in the facility. Palliative Care was asked to follow this patient to address advance care planning and complex medical decision making. This a follow up visit.                                     ASSESSMENT AND PLAN / RECOMMENDATIONS:   Goals of Care: Goals include to maximize quality of life and symptom management.  Patient was recently discharged from hospice due to stability. CODE STATUS: DNR  Patient was a hospice patient previously; discharged for stability.  Symptom Management/Plan: Dementia with behavioral disturbance: Memory loss and confusion  at baseline.   FAST 7A.  Continue ongoing supportive care.  Fall/safety precautions. Anxiety: Continue clonazepam. Deescalation techniques, calm approach; Psych consult as planned.  Bipolar-managed with Lamictal, Olanzapine. Psych following.  Insomnia: Managed with Melatonin, Trazodone.  Routine CBC CMP Follow up Palliative Care Visit: Palliative care will continue to follow for complex medical decision making, advance care planning, and clarification of goals. Return in 4 weeks or prn.  PPS: 30%  HOSPICE ELIGIBILITY/DIAGNOSIS: TBD  Chief Complaint: Palliative Medicine f/u visit.   HISTORY OF PRESENT ILLNESS:  [name] is a 82 y.o. year old female  with multiple comorbidities requiring close  monitoring, with high risk of complications, mortality: Advanced dementia, bipolar disorder, anxiety, PAD.  History of left hip fracture with ORIF 10/02/20, hyperlipidemia, anemia, insomnia, cachexia, GERD, right lower lobe nodule, severe protein calorie malnutrition, weight loss, hypoalbuminemia , history of smoking. Patient denies pain/discomfort, FLACC 0. Rest of 10 point ROS asked and negative.  History obtained from review of EMR, discussion with primary team, and interview with family, facility staff/caregiver and/or Ms. George.  I reviewed available labs, medications, imaging, studies and related documents from the EMR.  Records reviewed and summarized above.    CURRENT PROBLEM LIST:  Patient Active Problem List   Diagnosis Date Noted   HCAP (healthcare-associated pneumonia) 12/19/2020   PAD (peripheral artery disease) ([place]) 12/19/2020   HLD (hyperlipidemia) 12/19/2020   Insomnia 12/19/2020   GERD (gastroesophageal reflux disease) 12/19/2020   Bipolar disorder ([place]) 12/15/2020   Dementia with behavioral disturbance ([place]) 12/15/2020   SOB (shortness of breath) 12/12/2020   Acute respiratory failure with hypoxia ([place]) 12/12/2020   Community acquired pneumonia 12/12/2020   Pressure injury of skin 09/25/2019   Fall    Closed left subtrochanteric femur fracture ([place]) 09/21/2019   PAST MEDICAL HISTORY:  Active Ambulatory Problems    Diagnosis Date Noted   Closed left subtrochanteric femur fracture ([place]) 09/21/2019   Fall    Pressure injury of skin 09/25/2019   SOB (shortness of breath) 12/12/2020   Acute respiratory failure with hypoxia ([place]) 12/12/2020   Community acquired pneumonia 12/12/2020   Bipolar disorder ([place]) 12/15/2020  Dementia with behavioral disturbance ([place]) 12/15/2020   HCAP (healthcare-associated pneumonia) 12/19/2020   PAD (peripheral artery disease) ([place]) 12/19/2020   HLD (hyperlipidemia) 12/19/2020   Insomnia 12/19/2020   GERD (gastroesophageal reflux  disease) 12/19/2020   Resolved Ambulatory Problems    Diagnosis Date Noted   No Resolved Ambulatory Problems   Past Medical History:  Diagnosis Date   Alzheimer's dementia ([place])    Low back pain    Vomiting    SOCIAL HX:  Social History   Tobacco Use   Smoking status: Former   Smokeless tobacco: Never  Substance Use Topics   Alcohol use: Not Currently   FAMILY HX: No family history on file.    ALLERGIES: No Known Allergies   PERTINENT MEDICATIONS:  Outpatient Encounter Medications as of 11/16/2022  Medication Sig   acetaminophen (TYLENOL) 500 MG tablet Take 500 mg by mouth every 4 (four) hours as needed for mild pain or fever.    alum & mag hydroxide-simeth (MAALOX/MYLANTA) 200-200-20 MG/5ML suspension Take 30 mLs by mouth 4 (four) times daily as needed for indigestion or heartburn.   aspirin 81 MG chewable tablet Chew by mouth daily.   calcium carbonate (OSCAL) 1500 (600 Ca) MG TABS tablet Take 1 tablet by mouth daily.   cholecalciferol (VITAMIN D3) 25 MCG (1000 UT) tablet Take 1,000 Units by mouth daily.   divalproex (DEPAKOTE) 125 MG DR tablet Take 125 mg by mouth 2 (two) times daily.   DULoxetine (CYMBALTA) 60 MG capsule Take 60 mg by mouth daily.   enoxaparin (LOVENOX) 40 MG/0.4ML injection Inject 0.4 mLs (40 mg total) into the skin daily.   feeding supplement, ENSURE ENLIVE, (ENSURE ENLIVE) LIQD Take 237 mLs by mouth 2 (two) times daily between meals.   lactulose, encephalopathy, (CHRONULAC) 10 GM/15ML SOLN Take 30 g by mouth daily.   lamoTRIgine (LAMICTAL) 25 MG tablet Take 1 tablet by mouth daily.   magnesium hydroxide (MILK OF MAGNESIA) 400 MG/5ML suspension Take 30 mLs by mouth at bedtime as needed for mild constipation or moderate constipation.   magnesium oxide (MAG-OX) 400 (241.3 Mg) MG tablet Take 1 tablet (400 mg total) by mouth 2 (two) times daily.   Menthol-Zinc Oxide 0.44-20.625 % OINT Apply 1 application topically 4 (four) times daily as needed (skin  irritation).   mirtazapine (REMERON) 30 MG tablet Take 30 mg by mouth daily.   Multiple Vitamins-Minerals (MULTIVITAMIN ADULT, MINERALS,) TABS Take 1 tablet by mouth daily.   neomycin-bacitracin-polymyxin (NEOSPORIN) ointment Apply 1 application topically 4 (four) times daily as needed for wound care.   OLANZapine (ZYPREXA) 5 MG tablet Take 5 mg by mouth at bedtime.   omeprazole (PRILOSEC) 20 MG capsule Take 20 mg by mouth daily before breakfast.   ondansetron (ZOFRAN-ODT) 4 MG disintegrating tablet Take 4 mg by mouth every 6 (six) hours as needed for nausea or vomiting.   polyethylene glycol (MIRALAX / GLYCOLAX) 17 g packet Take 17 g by mouth at bedtime.   rivastigmine (EXELON) 6 MG capsule Take 6 mg by mouth 2 (two) times daily.   rosuvastatin (CRESTOR) 10 MG tablet Take 10 mg by mouth daily.   senna-docusate (SENOKOT-S) 8.6-50 MG tablet Take 2 tablets by mouth 2 (two) times daily.   traZODone (DESYREL) 50 MG tablet Take 50 mg by mouth at bedtime.   No facility-administered encounter medications on file as of 11/16/2022.   I spent 45 minutes providing this consultation; time includes spent with patient/family, chart review and documentation. More than 50% of the  time in this consultation was spent on care coordination.   Thank you for the opportunity to participate in the care of Ms. Castell.  The palliative care team will continue to follow. Please call our office at [phone] if we can be of additional assistance.   Teodoro Spray, NP

## 2022-11-16 NOTE — Progress Notes (Signed)
[facility] Consult Note Telephone: [phone]  Fax: [phone]   PATIENT NAME: [name] [address] [place] [address]   [phone] (home)  DOB: 1941-01-02 MRN: [id number] PRIMARY CARE PROVIDER:    Thea Gist FNP  REFERRING PROVIDER:   [facility] [place] [place],   [address] [phone]  RESPONSIBLE PARTY:    Contact Information     Name Relation Home Work [place]  [phone]   Erline Levine [phone]  [phone]        I met face to face with patient in the facility. Palliative Care was asked to follow this patient to address advance care planning and complex medical decision making. This a follow up visit.                                     ASSESSMENT AND PLAN / RECOMMENDATIONS:   Goals of Care: Goals include to maximize quality of life and symptom management.  Patient was recently discharged from hospice due to stability. CODE STATUS: DNR  Patient was a hospice patient previously; discharged for stability.  Symptom Management/Plan: Dementia with behavioral disturbance: Dementia with anxiety.  Progressive memory loss and confusion  at baseline.   FAST 7A.  Continue ongoing supportive care.  Fall/safety precautions. Anxiety: Managed with clonazepam. Deescalation techniques, calm approach; Psych consult as planned.  Bipolar-managed with Lamictal, Olanzapine.  Follow-up with psych as planned/as needed. Insomnia: Managed with Melatonin, Trazodone.  Routine CBC CMP.  Collaborative discussion with PCP.  No changes to plan of care.  Follow up Palliative Care Visit: Palliative care will continue to follow for complex medical decision making, advance care planning, and clarification of goals. Return in 4 weeks or prn.  PPS: 30%  HOSPICE ELIGIBILITY/DIAGNOSIS: TBD  Chief Complaint: Palliative  Medicine f/u visit.   HISTORY OF PRESENT ILLNESS:  [name] is a 82 y.o. year old female  with multiple comorbidities requiring close monitoring, with high risk of complications, mortality: Advanced dementia, bipolar disorder, anxiety, PAD.  History of left hip fracture with ORIF 10/02/20, hyperlipidemia, anemia, insomnia, cachexia, GERD, right lower lobe nodule, severe protein calorie malnutrition, weight loss, hypoalbuminemia , history of smoking. Patient denies pain/discomfort, FLACC 0. Rest of 10 point ROS asked and negative.  History obtained from review of EMR, discussion with primary team, and interview with family, facility staff/caregiver and/or Ms. Mcmannis.  I reviewed available labs, medications, imaging, studies and related documents from the EMR.  Records reviewed and summarized above.    CURRENT PROBLEM LIST:  Patient Active Problem List   Diagnosis Date Noted   HCAP (healthcare-associated pneumonia) 12/19/2020   PAD (peripheral artery disease) ([place]) 12/19/2020   HLD (hyperlipidemia) 12/19/2020   Insomnia 12/19/2020   GERD (gastroesophageal reflux disease) 12/19/2020   Bipolar disorder ([place]) 12/15/2020   Dementia with behavioral disturbance ([place]) 12/15/2020   SOB (shortness of breath) 12/12/2020   Acute respiratory failure with hypoxia ([place]) 12/12/2020   Community acquired pneumonia 12/12/2020   Pressure injury of skin 09/25/2019   Fall    Closed left subtrochanteric femur fracture ([place]) 09/21/2019   PAST MEDICAL HISTORY:  Active Ambulatory Problems    Diagnosis Date Noted   Closed left subtrochanteric femur fracture ([place]) 09/21/2019   Fall    Pressure injury of skin 09/25/2019   SOB (shortness of breath)  12/12/2020   Acute respiratory failure with hypoxia ([place]) 12/12/2020   Community acquired pneumonia 12/12/2020   Bipolar disorder ([place]) 12/15/2020   Dementia with behavioral disturbance ([place]) 12/15/2020   HCAP (healthcare-associated pneumonia) 12/19/2020   PAD  (peripheral artery disease) ([place]) 12/19/2020   HLD (hyperlipidemia) 12/19/2020   Insomnia 12/19/2020   GERD (gastroesophageal reflux disease) 12/19/2020   Resolved Ambulatory Problems    Diagnosis Date Noted   No Resolved Ambulatory Problems   Past Medical History:  Diagnosis Date   Alzheimer's dementia ([place])    Low back pain    Vomiting    SOCIAL HX:  Social History   Tobacco Use   Smoking status: Former   Smokeless tobacco: Never  Substance Use Topics   Alcohol use: Not Currently   FAMILY HX: No family history on file.    ALLERGIES: No Known Allergies   PERTINENT MEDICATIONS:  Outpatient Encounter Medications as of 11/16/2022  Medication Sig   acetaminophen (TYLENOL) 500 MG tablet Take 500 mg by mouth every 4 (four) hours as needed for mild pain or fever.    alum & mag hydroxide-simeth (MAALOX/MYLANTA) 200-200-20 MG/5ML suspension Take 30 mLs by mouth 4 (four) times daily as needed for indigestion or heartburn.   aspirin 81 MG chewable tablet Chew by mouth daily.   calcium carbonate (OSCAL) 1500 (600 Ca) MG TABS tablet Take 1 tablet by mouth daily.   cholecalciferol (VITAMIN D3) 25 MCG (1000 UT) tablet Take 1,000 Units by mouth daily.   divalproex (DEPAKOTE) 125 MG DR tablet Take 125 mg by mouth 2 (two) times daily.   DULoxetine (CYMBALTA) 60 MG capsule Take 60 mg by mouth daily.   enoxaparin (LOVENOX) 40 MG/0.4ML injection Inject 0.4 mLs (40 mg total) into the skin daily.   feeding supplement, ENSURE ENLIVE, (ENSURE ENLIVE) LIQD Take 237 mLs by mouth 2 (two) times daily between meals.   lactulose, encephalopathy, (CHRONULAC) 10 GM/15ML SOLN Take 30 g by mouth daily.   lamoTRIgine (LAMICTAL) 25 MG tablet Take 1 tablet by mouth daily.   magnesium hydroxide (MILK OF MAGNESIA) 400 MG/5ML suspension Take 30 mLs by mouth at bedtime as needed for mild constipation or moderate constipation.   magnesium oxide (MAG-OX) 400 (241.3 Mg) MG tablet Take 1 tablet (400 mg total) by mouth 2  (two) times daily.   Menthol-Zinc Oxide 0.44-20.625 % OINT Apply 1 application topically 4 (four) times daily as needed (skin irritation).   mirtazapine (REMERON) 30 MG tablet Take 30 mg by mouth daily.   Multiple Vitamins-Minerals (MULTIVITAMIN ADULT, MINERALS,) TABS Take 1 tablet by mouth daily.   neomycin-bacitracin-polymyxin (NEOSPORIN) ointment Apply 1 application topically 4 (four) times daily as needed for wound care.   OLANZapine (ZYPREXA) 5 MG tablet Take 5 mg by mouth at bedtime.   omeprazole (PRILOSEC) 20 MG capsule Take 20 mg by mouth daily before breakfast.   ondansetron (ZOFRAN-ODT) 4 MG disintegrating tablet Take 4 mg by mouth every 6 (six) hours as needed for nausea or vomiting.   polyethylene glycol (MIRALAX / GLYCOLAX) 17 g packet Take 17 g by mouth at bedtime.   rivastigmine (EXELON) 6 MG capsule Take 6 mg by mouth 2 (two) times daily.   rosuvastatin (CRESTOR) 10 MG tablet Take 10 mg by mouth daily.   senna-docusate (SENOKOT-S) 8.6-50 MG tablet Take 2 tablets by mouth 2 (two) times daily.   traZODone (DESYREL) 50 MG tablet Take 50 mg by mouth at bedtime.   No facility-administered encounter medications on file as of  11/16/2022.   I spent 45 minutes providing this consultation; time includes spent with patient/family, chart review and documentation. More than 50% of the time in this consultation was spent on care coordination.   Thank you for the opportunity to participate in the care of Ms. Dortch.  The palliative care team will continue to follow. Please call our office at [phone] if we can be of additional assistance.   Teodoro Spray, NP

## 2023-01-05 ENCOUNTER — Non-Acute Institutional Stay: Payer: Medicare Other | Admitting: Hospice

## 2023-01-05 DIAGNOSIS — F319 Bipolar disorder, unspecified: Secondary | ICD-10-CM

## 2023-01-05 DIAGNOSIS — F419 Anxiety disorder, unspecified: Secondary | ICD-10-CM

## 2023-01-05 DIAGNOSIS — Z515 Encounter for palliative care: Secondary | ICD-10-CM

## 2023-01-05 DIAGNOSIS — G4700 Insomnia, unspecified: Secondary | ICD-10-CM

## 2023-01-05 DIAGNOSIS — F03918 Unspecified dementia, unspecified severity, with other behavioral disturbance: Secondary | ICD-10-CM

## 2023-01-05 NOTE — Progress Notes (Signed)
Therapist, nutritional Palliative Care Consult Note Telephone: [phone]  Fax: [phone]   PATIENT NAME: [name] [address] [place] [address]   [phone] (home)  DOB: [birth year] MRN: [id number] PRIMARY CARE PROVIDER:    Smiley Houseman FNP  REFERRING PROVIDER:   [facility], [facility] [address] [place],  [place] [address] [phone]  RESPONSIBLE PARTY:    Contact Information     Name Relation Home Work [employer] Legal Guardian  [phone]   [name] [phone]  [phone]        I met face to face with patient in the facility. Palliative Care was asked to follow this patient to address advance care planning and complex medical decision making. This a follow up visit.                                     ASSESSMENT AND PLAN / RECOMMENDATIONS:   Goals of Care: Goals include to maximize quality of life and symptom management.   CODE STATUS: DNR  Patient was a hospice patient previously; discharged for stability.  Symptom Management/Plan: Dementia: Advanced, nonambulatory, incontinent of bowel and bladder.  FAST 7A.  Continue ongoing supportive care.  Fall/safety precautions.  Gait instability: Currently working with OT for safety.  Collaboration with OT who reports good is for functional transfers, lower body dressing, environmental adaptation, exercise program.  Fall precautions.  Anxiety: Managed with clonazepam. Deescalation techniques, calm approach; Psych consult as planned.  Bipolar-managed with Lamictal, Olanzapine.  Follow-up with psych as planned/as needed. Insomnia: Managed with Melatonin, Trazodone.   Follow up Palliative Care Visit: Palliative care will continue to follow for chronic disease progression, complex medical decision making, advance care planning, and clarification of goals. Return in 4 weeks or prn.  PPS: 30%  HOSPICE  ELIGIBILITY/DIAGNOSIS: TBD  Chief Complaint: Palliative Medicine f/u visit.   HISTORY OF PRESENT ILLNESS:  [name] is a 82 y.o. year old female  with multiple comorbidities requiring close monitoring, with high risk of complications, mortality: Advanced dementia, bipolar disorder, anxiety, PAD.  History of left hip fracture with ORIF 10/02/20, hyperlipidemia, anemia, insomnia, cachexia, GERD, right lower lobe nodule, severe protein calorie malnutrition, weight loss, hypoalbuminemia , history of smoking. Patient denies pain/discomfort, FLACC 0. History obtained from review of EMR, discussion with primary team, and interview with family, facility staff/caregiver and/or Ms. Tolsma.  I reviewed available labs, medications, imaging, studies and related documents from the EMR.  Records reviewed and summarized above.    CURRENT PROBLEM LIST:  Patient Active Problem List   Diagnosis Date Noted   HCAP (healthcare-associated pneumonia) 12/19/2020   PAD (peripheral artery disease) 12/19/2020   HLD (hyperlipidemia) 12/19/2020   Insomnia 12/19/2020   GERD (gastroesophageal reflux disease) 12/19/2020   Bipolar disorder 12/15/2020   Dementia with behavioral disturbance 12/15/2020   SOB (shortness of breath) 12/12/2020   Acute respiratory failure with hypoxia 12/12/2020   Community acquired pneumonia 12/12/2020   Pressure injury of skin 09/25/2019   Fall    Closed left subtrochanteric femur fracture 09/21/2019   PAST MEDICAL HISTORY:  Active Ambulatory Problems    Diagnosis Date Noted   Closed left subtrochanteric femur fracture 09/21/2019   Fall    Pressure injury of skin 09/25/2019   SOB (shortness of breath) 12/12/2020   Acute respiratory failure with hypoxia 12/12/2020   Community acquired  pneumonia 12/12/2020   Bipolar disorder 12/15/2020   Dementia with behavioral disturbance 12/15/2020   HCAP (healthcare-associated pneumonia) 12/19/2020   PAD (peripheral artery disease) 12/19/2020    HLD (hyperlipidemia) 12/19/2020   Insomnia 12/19/2020   GERD (gastroesophageal reflux disease) 12/19/2020   Resolved Ambulatory Problems    Diagnosis Date Noted   No Resolved Ambulatory Problems   Past Medical History:  Diagnosis Date   Alzheimer's dementia (H[facility])    Low back pain    Vomiting    SOCIAL HX:  Social History   Tobacco Use   Smoking status: Former   Smokeless tobacco: Never  Substance Use Topics   Alcohol use: Not Currently   FAMILY HX: No family history on file.    ALLERGIES: No Known Allergies   PERTINENT MEDICATIONS:  Outpatient Encounter Medications as of 01/05/2023  Medication Sig   acetaminophen (TYLENOL) 500 MG tablet Take 500 mg by mouth every 4 (four) hours as needed for mild pain or fever.    alum & mag hydroxide-simeth (MAALOX/MYLANTA) 200-200-20 MG/5ML suspension Take 30 mLs by mouth 4 (four) times daily as needed for indigestion or heartburn.   aspirin 81 MG chewable tablet Chew by mouth daily.   calcium carbonate (OSCAL) 1500 (600 Ca) MG TABS tablet Take 1 tablet by mouth daily.   cholecalciferol (VITAMIN D3) 25 MCG (1000 UT) tablet Take 1,000 Units by mouth daily.   divalproex (DEPAKOTE) 125 MG DR tablet Take 125 mg by mouth 2 (two) times daily.   DULoxetine (CYMBALTA) 60 MG capsule Take 60 mg by mouth daily.   enoxaparin (LOVENOX) 40 MG/0.4ML injection Inject 0.4 mLs (40 mg total) into the skin daily.   feeding supplement, ENSURE ENLIVE, (ENSURE ENLIVE) LIQD Take 237 mLs by mouth 2 (two) times daily between meals.   lactulose, encephalopathy, (CHRONULAC) 10 GM/15ML SOLN Take 30 g by mouth daily.   lamoTRIgine (LAMICTAL) 25 MG tablet Take 1 tablet by mouth daily.   magnesium hydroxide (MILK OF MAGNESIA) 400 MG/5ML suspension Take 30 mLs by mouth at bedtime as needed for mild constipation or moderate constipation.   magnesium oxide (MAG-OX) 400 (241.3 Mg) MG tablet Take 1 tablet (400 mg total) by mouth 2 (two) times daily.   Menthol-Zinc Oxide  0.44-20.625 % OINT Apply 1 application topically 4 (four) times daily as needed (skin irritation).   mirtazapine (REMERON) 30 MG tablet Take 30 mg by mouth daily.   Multiple Vitamins-Minerals (MULTIVITAMIN ADULT, MINERALS,) TABS Take 1 tablet by mouth daily.   neomycin-bacitracin-polymyxin (NEOSPORIN) ointment Apply 1 application topically 4 (four) times daily as needed for wound care.   OLANZapine (ZYPREXA) 5 MG tablet Take 5 mg by mouth at bedtime.   omeprazole (PRILOSEC) 20 MG capsule Take 20 mg by mouth daily before breakfast.   ondansetron (ZOFRAN-ODT) 4 MG disintegrating tablet Take 4 mg by mouth every 6 (six) hours as needed for nausea or vomiting.   polyethylene glycol (MIRALAX / GLYCOLAX) 17 g packet Take 17 g by mouth at bedtime.   rivastigmine (EXELON) 6 MG capsule Take 6 mg by mouth 2 (two) times daily.   rosuvastatin (CRESTOR) 10 MG tablet Take 10 mg by mouth daily.   senna-docusate (SENOKOT-S) 8.6-50 MG tablet Take 2 tablets by mouth 2 (two) times daily.   traZODone (DESYREL) 50 MG tablet Take 50 mg by mouth at bedtime.   No facility-administered encounter medications on file as of 01/05/2023.   I spent 60 minutes providing this consultation; time includes spent with patient/family/clinical staff, chart  review and documentation. More than 50% of the time in this consultation was spent on care coordination.   Thank you for the opportunity to participate in the care of Ms. Stalnaker.  The palliative care team will continue to follow. Please call our office at [phone] if we can be of additional assistance.   Rosaura Carpenter, NP

## 2023-02-14 ENCOUNTER — Non-Acute Institutional Stay: Payer: Medicare Other | Admitting: Hospice

## 2023-02-14 DIAGNOSIS — Z515 Encounter for palliative care: Secondary | ICD-10-CM

## 2023-02-14 DIAGNOSIS — G4700 Insomnia, unspecified: Secondary | ICD-10-CM

## 2023-02-14 DIAGNOSIS — F03918 Unspecified dementia, unspecified severity, with other behavioral disturbance: Secondary | ICD-10-CM

## 2023-02-14 DIAGNOSIS — R2681 Unsteadiness on feet: Secondary | ICD-10-CM

## 2023-02-14 DIAGNOSIS — F419 Anxiety disorder, unspecified: Secondary | ICD-10-CM

## 2023-02-14 NOTE — Progress Notes (Signed)
Therapist, nutritional Palliative Care Consult Note Telephone: [phone]  Fax: [phone]   PATIENT NAME: [name] [address] [place] [address]   [phone] (home)  DOB: [birth year] MRN: [id number] PRIMARY CARE PROVIDER:    Smiley Houseman FNP  REFERRING PROVIDER:   [facility] [address] [place],  [place] [address] [phone]  RESPONSIBLE PARTY:    Contact Information     Name Relation Home Work [place] Legal Guardian  [phone]   Despina Hidden [phone]  [phone]        I met face to face with patient in the facility. Palliative Care was asked to follow this patient to address advance care planning and complex medical decision making. This a follow up visit.                                     ASSESSMENT AND PLAN / RECOMMENDATIONS:   Goals of Care: Goals include to maximize quality of life and symptom management.   CODE STATUS: DNR  Patient was a hospice patient previously; discharged for stability.  Symptom Management/Plan: Patient is fairly stable, no fall since last visit, no ED visit, no hospitalization or acuities. No changes to poc; continue to monitor closely for further decline in cognition and functional status.   Dementia: Advanced, nonambulatory, incontinent of bowel and bladder.  FAST 7A.  Continue ongoing supportive care.  Fall/safety precautions.  Gait instability: Completed OT for transfers and environmental adaptation. Continue facility exercise program.  Fall precautions.  Anxiety: Managed with clonazepam. Deescalation techniques, calm approach; Psych consult as planned.   Bipolar-managed with Lamictal, Olanzapine.  Follow-up with psych as planned/as needed.  Insomnia: Managed with Melatonin, Trazodone.   Monitor weight per facility protocol; 117.5 Ibs 02/02/23  Follow up Palliative Care Visit: Palliative care will continue  to follow for chronic disease progression, complex medical decision making, advance care planning, and clarification of goals. Return in 4 weeks or prn.  PPS: 30%  HOSPICE ELIGIBILITY/DIAGNOSIS: TBD  Chief Complaint: Palliative Medicine f/u visit.   HISTORY OF PRESENT ILLNESS:  [name] is a 82 y.o. year old female  with multiple comorbidities requiring close monitoring, with high risk of complications, mortality: Advanced dementia, bipolar disorder, anxiety, PAD.  History of left hip fracture with ORIF 10/02/20, hyperlipidemia, anemia, insomnia, cachexia, GERD, right lower lobe nodule, severe protein calorie malnutrition, weight loss, hypoalbuminemia , history of smoking. Patient denies pain/discomfort, FLACC 0.  Nursing with no concerns.  History obtained from review of EMR, discussion with primary team, and interview with family, facility staff/caregiver and/or Ms. Nunes.  I reviewed available labs, medications, imaging, studies and related documents from the EMR.  Records reviewed and summarized above.    CURRENT PROBLEM LIST:  Patient Active Problem List   Diagnosis Date Noted   HCAP (healthcare-associated pneumonia) 12/19/2020   PAD (peripheral artery disease) ([facility]) 12/19/2020   HLD (hyperlipidemia) 12/19/2020   Insomnia 12/19/2020   GERD (gastroesophageal reflux disease) 12/19/2020   Bipolar disorder (HCC) 12/15/2020   Dementia with behavioral disturbance ([facility]) 12/15/2020   SOB (shortness of breath) 12/12/2020   Acute respiratory failure with hypoxia ([facility]) 12/12/2020   Community acquired pneumonia 12/12/2020   Pressure injury of skin 09/25/2019   Fall    Closed left subtrochanteric femur fracture ([facility]) 09/21/2019   PAST MEDICAL HISTORY:  Active Ambulatory Problems  Diagnosis Date Noted   Closed left subtrochanteric femur fracture ([facility]) 09/21/2019   Fall    Pressure injury of skin 09/25/2019   SOB (shortness of breath) 12/12/2020   Acute respiratory failure with  hypoxia (HCC) 12/12/2020   Community acquired pneumonia 12/12/2020   Bipolar disorder (HCC) 12/15/2020   Dementia with behavioral disturbance (HCC) 12/15/2020   HCAP (healthcare-associated pneumonia) 12/19/2020   PAD (peripheral artery disease) (HCC) 12/19/2020   HLD (hyperlipidemia) 12/19/2020   Insomnia 12/19/2020   GERD (gastroesophageal reflux disease) 12/19/2020   Resolved Ambulatory Problems    Diagnosis Date Noted   No Resolved Ambulatory Problems   Past Medical History:  Diagnosis Date   Alzheimer's dementia (HCC)    Low back pain    Vomiting    SOCIAL HX:  Social History   Tobacco Use   Smoking status: Former   Smokeless tobacco: Never  Substance Use Topics   Alcohol use: Not Currently   FAMILY HX: No family history on file.    ALLERGIES: No Known Allergies   PERTINENT MEDICATIONS:  Outpatient Encounter Medications as of 02/14/2023  Medication Sig   acetaminophen (TYLENOL) 500 MG tablet Take 500 mg by mouth every 4 (four) hours as needed for mild pain or fever.    alum & mag hydroxide-simeth (MAALOX/MYLANTA) 200-200-20 MG/5ML suspension Take 30 mLs by mouth 4 (four) times daily as needed for indigestion or heartburn.   aspirin 81 MG chewable tablet Chew by mouth daily.   calcium carbonate (OSCAL) 1500 (600 Ca) MG TABS tablet Take 1 tablet by mouth daily.   cholecalciferol (VITAMIN D3) 25 MCG (1000 UT) tablet Take 1,000 Units by mouth daily.   divalproex (DEPAKOTE) 125 MG DR tablet Take 125 mg by mouth 2 (two) times daily.   DULoxetine (CYMBALTA) 60 MG capsule Take 60 mg by mouth daily.   enoxaparin (LOVENOX) 40 MG/0.4ML injection Inject 0.4 mLs (40 mg total) into the skin daily.   feeding supplement, ENSURE ENLIVE, (ENSURE ENLIVE) LIQD Take 237 mLs by mouth 2 (two) times daily between meals.   lactulose, encephalopathy, (CHRONULAC) 10 GM/15ML SOLN Take 30 g by mouth daily.   lamoTRIgine (LAMICTAL) 25 MG tablet Take 1 tablet by mouth daily.   magnesium hydroxide  (MILK OF MAGNESIA) 400 MG/5ML suspension Take 30 mLs by mouth at bedtime as needed for mild constipation or moderate constipation.   magnesium oxide (MAG-OX) 400 (241.3 Mg) MG tablet Take 1 tablet (400 mg total) by mouth 2 (two) times daily.   Menthol-Zinc Oxide 0.44-20.625 % OINT Apply 1 application topically 4 (four) times daily as needed (skin irritation).   mirtazapine (REMERON) 30 MG tablet Take 30 mg by mouth daily.   Multiple Vitamins-Minerals (MULTIVITAMIN ADULT, MINERALS,) TABS Take 1 tablet by mouth daily.   neomycin-bacitracin-polymyxin (NEOSPORIN) ointment Apply 1 application topically 4 (four) times daily as needed for wound care.   OLANZapine (ZYPREXA) 5 MG tablet Take 5 mg by mouth at bedtime.   omeprazole (PRILOSEC) 20 MG capsule Take 20 mg by mouth daily before breakfast.   ondansetron (ZOFRAN-ODT) 4 MG disintegrating tablet Take 4 mg by mouth every 6 (six) hours as needed for nausea or vomiting.   polyethylene glycol (MIRALAX / GLYCOLAX) 17 g packet Take 17 g by mouth at bedtime.   rivastigmine (EXELON) 6 MG capsule Take 6 mg by mouth 2 (two) times daily.   rosuvastatin (CRESTOR) 10 MG tablet Take 10 mg by mouth daily.   senna-docusate (SENOKOT-S) 8.6-50 MG tablet Take 2 tablets by  mouth 2 (two) times daily.   traZODone (DESYREL) 50 MG tablet Take 50 mg by mouth at bedtime.   No facility-administered encounter medications on file as of 02/14/2023.   I spent 40 minutes providing this consultation; time includes spent with patient/family/clinical staff, chart review and documentation. More than 50% of the time in this consultation was spent on care coordination.   Thank you for the opportunity to participate in the care of Ms. Quraishi.  The palliative care team will continue to follow. Please call our office at [phone] if we can be of additional assistance.   Rosaura Carpenter, NP

## 2023-04-28 ENCOUNTER — Inpatient Hospital Stay: Payer: Medicare Other

## 2023-04-28 ENCOUNTER — Emergency Department: Payer: Medicare Other

## 2023-04-28 ENCOUNTER — Other Ambulatory Visit: Payer: Self-pay

## 2023-04-28 ENCOUNTER — Inpatient Hospital Stay
Admission: EM | Admit: 2023-04-28 | Discharge: 2023-05-10 | DRG: 475 | Disposition: A | Discharge status: Skilled Nursing Facility | Payer: Medicare Other | Source: Skilled Nursing Facility | Attending: Internal Medicine | Admitting: Internal Medicine

## 2023-04-28 DIAGNOSIS — F02818 Dementia in other diseases classified elsewhere, unspecified severity, with other behavioral disturbance: Secondary | ICD-10-CM | POA: Diagnosis present

## 2023-04-28 DIAGNOSIS — I739 Peripheral vascular disease, unspecified: Secondary | ICD-10-CM | POA: Diagnosis present

## 2023-04-28 DIAGNOSIS — Z66 Do not resuscitate: Secondary | ICD-10-CM | POA: Diagnosis present

## 2023-04-28 DIAGNOSIS — L97529 Non-pressure chronic ulcer of other part of left foot with unspecified severity: Secondary | ICD-10-CM | POA: Diagnosis present

## 2023-04-28 DIAGNOSIS — G309 Alzheimer's disease, unspecified: Secondary | ICD-10-CM | POA: Diagnosis present

## 2023-04-28 DIAGNOSIS — Z5320 Procedure and treatment not carried out because of patient's decision for unspecified reasons: Secondary | ICD-10-CM | POA: Diagnosis present

## 2023-04-28 DIAGNOSIS — M868X6 Other osteomyelitis, lower leg: Principal | ICD-10-CM | POA: Diagnosis present

## 2023-04-28 DIAGNOSIS — D509 Iron deficiency anemia, unspecified: Secondary | ICD-10-CM | POA: Diagnosis present

## 2023-04-28 DIAGNOSIS — Z79632 Long term (current) use of antitumor antibiotic: Secondary | ICD-10-CM | POA: Diagnosis not present

## 2023-04-28 DIAGNOSIS — F0283 Dementia in other diseases classified elsewhere, unspecified severity, with mood disturbance: Secondary | ICD-10-CM | POA: Diagnosis present

## 2023-04-28 DIAGNOSIS — I70245 Atherosclerosis of native arteries of left leg with ulceration of other part of foot: Secondary | ICD-10-CM | POA: Diagnosis not present

## 2023-04-28 DIAGNOSIS — Z79899 Other long term (current) drug therapy: Secondary | ICD-10-CM

## 2023-04-28 DIAGNOSIS — L03116 Cellulitis of left lower limb: Secondary | ICD-10-CM | POA: Diagnosis present

## 2023-04-28 DIAGNOSIS — Z7982 Long term (current) use of aspirin: Secondary | ICD-10-CM

## 2023-04-28 DIAGNOSIS — M86172 Other acute osteomyelitis, left ankle and foot: Secondary | ICD-10-CM | POA: Diagnosis not present

## 2023-04-28 DIAGNOSIS — Z87891 Personal history of nicotine dependence: Secondary | ICD-10-CM

## 2023-04-28 DIAGNOSIS — M86072 Acute hematogenous osteomyelitis, left ankle and foot: Secondary | ICD-10-CM | POA: Diagnosis not present

## 2023-04-28 DIAGNOSIS — I70201 Unspecified atherosclerosis of native arteries of extremities, right leg: Secondary | ICD-10-CM | POA: Diagnosis not present

## 2023-04-28 DIAGNOSIS — E872 Acidosis, unspecified: Secondary | ICD-10-CM | POA: Diagnosis present

## 2023-04-28 DIAGNOSIS — F03918 Unspecified dementia, unspecified severity, with other behavioral disturbance: Secondary | ICD-10-CM | POA: Diagnosis present

## 2023-04-28 DIAGNOSIS — F319 Bipolar disorder, unspecified: Secondary | ICD-10-CM | POA: Diagnosis present

## 2023-04-28 DIAGNOSIS — I700 Atherosclerosis of aorta: Secondary | ICD-10-CM | POA: Diagnosis not present

## 2023-04-28 DIAGNOSIS — M869 Osteomyelitis, unspecified: Secondary | ICD-10-CM | POA: Diagnosis not present

## 2023-04-28 DIAGNOSIS — Z7189 Other specified counseling: Secondary | ICD-10-CM | POA: Diagnosis not present

## 2023-04-28 DIAGNOSIS — L0390 Cellulitis, unspecified: Secondary | ICD-10-CM | POA: Diagnosis present

## 2023-04-28 LAB — COMPREHENSIVE METABOLIC PANEL
ALT: 16 U/L (ref 0–44)
AST: 19 U/L (ref 15–41)
Albumin: 3.3 g/dL — ABNORMAL LOW (ref 3.5–5.0)
Alkaline Phosphatase: 77 U/L (ref 38–126)
Anion gap: 12 (ref 5–15)
BUN: 26 mg/dL — ABNORMAL HIGH (ref 8–23)
CO2: 23 mmol/L (ref 22–32)
Calcium: 9.3 mg/dL (ref 8.9–10.3)
Chloride: 102 mmol/L (ref 98–111)
Creatinine, Ser: 0.66 mg/dL (ref 0.44–1.00)
GFR, Estimated: >60 mL/min (ref >60)
Glucose, Bld: 90 mg/dL (ref 70–99)
Potassium: 4.2 mmol/L (ref 3.5–5.1)
Sodium: 137 mmol/L (ref 135–145)
Total Bilirubin: 0.6 mg/dL (ref 0.3–1.2)
Total Protein: 6.8 g/dL (ref 6.5–8.1)

## 2023-04-28 LAB — CBC WITH DIFFERENTIAL/PLATELET
Abs Immature Granulocytes: 0.05 10*3/uL (ref 0.00–0.07)
Basophils Absolute: 0 10*3/uL (ref 0.0–0.1)
Basophils Relative: 0 %
Eosinophils Absolute: 0.4 10*3/uL (ref 0.0–0.5)
Eosinophils Relative: 3 %
HCT: 38.4 % (ref 36.0–46.0)
Hemoglobin: 11.9 g/dL — ABNORMAL LOW (ref 12.0–15.0)
Immature Granulocytes: 0 %
Lymphocytes Relative: 22 %
Lymphs Abs: 2.7 10*3/uL (ref 0.7–4.0)
MCH: 29.2 pg (ref 26.0–34.0)
MCHC: 31 g/dL (ref 30.0–36.0)
MCV: 94.1 fL (ref 80.0–100.0)
Monocytes Absolute: 1.4 10*3/uL — ABNORMAL HIGH (ref 0.1–1.0)
Monocytes Relative: 11 %
Neutro Abs: 7.5 10*3/uL (ref 1.7–7.7)
Neutrophils Relative %: 64 %
Platelets: 406 10*3/uL — ABNORMAL HIGH (ref 150–400)
RBC: 4.08 MIL/uL (ref 3.87–5.11)
RDW: 13.2 % (ref 11.5–15.5)
WBC: 12 10*3/uL — ABNORMAL HIGH (ref 4.0–10.5)
nRBC: 0 % (ref 0.0–0.2)

## 2023-04-28 LAB — C-REACTIVE PROTEIN: CRP: 10.9 mg/dL — ABNORMAL HIGH (ref <1.0)

## 2023-04-28 LAB — CBC
HCT: 31.8 % — ABNORMAL LOW (ref 36.0–46.0)
Hemoglobin: 10.1 g/dL — ABNORMAL LOW (ref 12.0–15.0)
MCH: 29.4 pg (ref 26.0–34.0)
MCHC: 31.8 g/dL (ref 30.0–36.0)
MCV: 92.4 fL (ref 80.0–100.0)
Platelets: 346 10*3/uL (ref 150–400)
RBC: 3.44 MIL/uL — ABNORMAL LOW (ref 3.87–5.11)
RDW: 13.2 % (ref 11.5–15.5)
WBC: 10.9 10*3/uL — ABNORMAL HIGH (ref 4.0–10.5)
nRBC: 0 % (ref 0.0–0.2)

## 2023-04-28 LAB — CREATININE, SERUM
Creatinine, Ser: 0.6 mg/dL (ref 0.44–1.00)
GFR, Estimated: >60 mL/min (ref >60)

## 2023-04-28 LAB — SEDIMENTATION RATE: Sed Rate: 63 mm/hr — ABNORMAL HIGH (ref 0–30)

## 2023-04-28 LAB — LACTIC ACID, PLASMA
Lactic Acid, Venous: 2.2 mmol/L (ref 0.5–1.9)
Lactic Acid, Venous: 0.7 mmol/L (ref 0.5–1.9)

## 2023-04-28 MED ORDER — ACETAMINOPHEN 500 MG PO TABS
1000.0000 mg | ORAL_TABLET | Freq: Once | ORAL | Status: AC
  Administered 2023-04-28: 1000 mg via ORAL
  Filled 2023-04-28: qty 2

## 2023-04-28 MED ORDER — SERTRALINE HCL 50 MG PO TABS
100.0000 mg | ORAL_TABLET | Freq: Every day | ORAL | Status: DC
  Administered 2023-04-29 – 2023-05-10 (×9): 100 mg via ORAL
  Filled 2023-04-28 (×10): qty 2

## 2023-04-28 MED ORDER — VITAMIN D 25 MCG (1000 UNIT) PO TABS
1000.0000 [IU] | ORAL_TABLET | Freq: Every day | ORAL | Status: DC
  Administered 2023-04-29 – 2023-05-10 (×8): 1000 [IU] via ORAL
  Filled 2023-04-28 (×9): qty 1

## 2023-04-28 MED ORDER — LAMOTRIGINE 25 MG PO TABS
25.0000 mg | ORAL_TABLET | Freq: Two times a day (BID) | ORAL | Status: DC
  Administered 2023-04-28 – 2023-05-10 (×16): 25 mg via ORAL
  Filled 2023-04-28 (×20): qty 1

## 2023-04-28 MED ORDER — OLANZAPINE 5 MG PO TABS
5.0000 mg | ORAL_TABLET | Freq: Every day | ORAL | Status: DC
  Administered 2023-04-28 – 2023-05-04 (×7): 5 mg via ORAL
  Filled 2023-04-28 (×10): qty 1

## 2023-04-28 MED ORDER — SODIUM CHLORIDE 0.9 % IV BOLUS
500.0000 mL | Freq: Once | INTRAVENOUS | Status: AC
  Administered 2023-04-28: 500 mL via INTRAVENOUS

## 2023-04-28 MED ORDER — MORPHINE SULFATE (PF) 2 MG/ML IV SOLN
2.0000 mg | Freq: Once | INTRAVENOUS | Status: AC
  Administered 2023-04-28: 2 mg via INTRAVENOUS
  Filled 2023-04-28: qty 1

## 2023-04-28 MED ORDER — MORPHINE SULFATE (PF) 4 MG/ML IV SOLN: 4.0000 mg | Freq: Once | INTRAVENOUS | Status: DC

## 2023-04-28 MED ORDER — VANCOMYCIN HCL 750 MG/150ML IV SOLN
750.0000 mg | INTRAVENOUS | Status: DC
  Administered 2023-04-29 – 2023-05-03 (×5): 750 mg via INTRAVENOUS
  Filled 2023-04-28 (×5): qty 150

## 2023-04-28 MED ORDER — ENOXAPARIN SODIUM 40 MG/0.4ML IJ SOSY
40.0000 mg | PREFILLED_SYRINGE | INTRAMUSCULAR | Status: DC
  Administered 2023-04-28 – 2023-05-04 (×7): 40 mg via SUBCUTANEOUS
  Filled 2023-04-28 (×10): qty 0.4

## 2023-04-28 MED ORDER — MELATONIN 5 MG PO TABS
5.0000 mg | ORAL_TABLET | Freq: Every day | ORAL | Status: DC
  Administered 2023-04-28 – 2023-05-04 (×7): 5 mg via ORAL
  Filled 2023-04-28 (×10): qty 1

## 2023-04-28 MED ORDER — TRAZODONE HCL 100 MG PO TABS
100.0000 mg | ORAL_TABLET | Freq: Every day | ORAL | Status: DC
  Administered 2023-04-28 – 2023-05-04 (×7): 100 mg via ORAL
  Filled 2023-04-28 (×10): qty 1

## 2023-04-28 MED ORDER — CLONAZEPAM 0.5 MG PO TABS: 0.5000 mg | ORAL_TABLET | Freq: Two times a day (BID) | ORAL | Status: DC | PRN

## 2023-04-28 MED ORDER — ACETAMINOPHEN 500 MG PO TABS: 500.0000 mg | ORAL_TABLET | ORAL | Status: DC | PRN

## 2023-04-28 MED ORDER — ADULT MULTIVITAMIN W/MINERALS CH
1.0000 | ORAL_TABLET | Freq: Every day | ORAL | Status: DC
  Administered 2023-04-29 – 2023-05-10 (×8): 1 via ORAL
  Filled 2023-04-28 (×9): qty 1

## 2023-04-28 MED ORDER — ONDANSETRON HCL 4 MG/2ML IJ SOLN
4.0000 mg | Freq: Once | INTRAMUSCULAR | Status: AC
  Administered 2023-04-28: 4 mg via INTRAVENOUS
  Filled 2023-04-28: qty 2

## 2023-04-28 MED ORDER — SENNOSIDES-DOCUSATE SODIUM 8.6-50 MG PO TABS
2.0000 | ORAL_TABLET | Freq: Two times a day (BID) | ORAL | Status: DC
  Administered 2023-04-28 – 2023-05-10 (×15): 2 via ORAL
  Filled 2023-04-28 (×19): qty 2

## 2023-04-28 MED ORDER — GADOBUTROL 1 MMOL/ML IV SOLN
5.0000 mL | Freq: Once | INTRAVENOUS | Status: AC | PRN
  Administered 2023-04-28: 5 mL via INTRAVENOUS

## 2023-04-28 MED ORDER — ENSURE ENLIVE PO LIQD
237.0000 mL | Freq: Two times a day (BID) | ORAL | Status: DC
  Administered 2023-04-29 – 2023-05-10 (×11): 237 mL via ORAL

## 2023-04-28 MED ORDER — SODIUM CHLORIDE 0.9 % IV SOLN
2.0000 g | Freq: Once | INTRAVENOUS | Status: AC
  Administered 2023-04-28: 2 g via INTRAVENOUS
  Filled 2023-04-28: qty 20

## 2023-04-28 MED ORDER — VANCOMYCIN HCL IN DEXTROSE 1-5 GM/200ML-% IV SOLN
1000.0000 mg | Freq: Once | INTRAVENOUS | Status: AC
  Administered 2023-04-28: 1000 mg via INTRAVENOUS
  Filled 2023-04-28: qty 200

## 2023-04-28 MED ORDER — TERBINAFINE HCL 250 MG PO TABS
250.0000 mg | ORAL_TABLET | Freq: Every day | ORAL | Status: DC
  Filled 2023-04-28: qty 1

## 2023-04-28 MED ORDER — SODIUM CHLORIDE 0.9 % IV SOLN: 1.0000 g | INTRAVENOUS | Status: DC

## 2023-04-28 MED ORDER — SODIUM CHLORIDE 0.9 % IV SOLN
1.0000 g | INTRAVENOUS | Status: DC
  Administered 2023-04-29: 1 g via INTRAVENOUS
  Filled 2023-04-28: qty 10

## 2023-04-28 NOTE — ED Notes (Signed)
ED Provider at bedside. 

## 2023-04-28 NOTE — ED Provider Notes (Signed)
[facility] Provider Note    Event Date/Time   First MD Initiated Contact with Patient 04/28/23 1509     (approximate)   History   Wound Infection   HPI  [name] is a 82 y.o. female here with pain, redness, and wound to her left foot.  The patient states that she believes her pain began in between her toes and on the top of her left second toe.  Over the last week, it has become increasingly red, painful, and now tender.  She has had difficulty walking due to this.  She lives in assisted living and can no longer ambulate even with a walker.  The pain is aching and throbbing.  She has had chills but no known fevers.  No history of previous issues similar to this.  No history of previous toe surgeries.  Denies any trauma.  Denies known history of diabetes.     Physical Exam   Triage Vital Signs: ED Triage Vitals  Encounter Vitals Group     BP 04/28/23 1205 128/70     Systolic BP Percentile --      Diastolic BP Percentile --      Pulse Rate 04/28/23 1205 70     Resp 04/28/23 1205 18     Temp 04/28/23 1205 98.4 F (36.9 C)     Temp Source 04/28/23 1205 Oral     SpO2 04/28/23 1205 96 %     Weight 04/28/23 1206 120 lb (54.4 kg)     Height 04/28/23 1206 5\' 7"  (1.702 m)     Head Circumference --      Peak Flow --      Pain Score 04/28/23 1209 1     Pain Loc --      Pain Education --      Exclude from Growth Chart --     Most recent vital signs: Vitals:   04/28/23 1759 04/28/23 1856  BP: 130/67 (!) 144/60  Pulse: 87 81  Resp: 18 17  Temp: 98.1 F (36.7 C) (!) 97.5 F (36.4 C)  SpO2: 95% (!) 85%     General: Awake, no distress.  CV:  Good peripheral perfusion. RRR.  Resp:  Normal work of breathing. Lungs clear bilaterally. Abd:  No distention. No tenderness. Other:  Significant erythema, warmth to left distal foot with open ulceration along dorsal aspect of second toe, along with macerated skin in interdigital spaces worse in first  and second web spaces.        ED Results / Procedures / Treatments   Labs (all labs ordered are listed, but only abnormal results are displayed) Labs Reviewed  CBC WITH DIFFERENTIAL/PLATELET - Abnormal; Notable for the following components:      Result Value   WBC 12.0 (*)    Hemoglobin 11.9 (*)    Platelets 406 (*)    Monocytes Absolute 1.4 (*)    All other components within normal limits  COMPREHENSIVE METABOLIC PANEL - Abnormal; Notable for the following components:   BUN 26 (*)    Albumin 3.3 (*)    All other components within normal limits  SEDIMENTATION RATE - Abnormal; Notable for the following components:   Sed Rate 63 (*)    All other components within normal limits  LACTIC ACID, PLASMA - Abnormal; Notable for the following components:   Lactic Acid, Venous 2.2 (*)    All other components within normal limits  CULTURE, BLOOD (ROUTINE X 2)  CULTURE,  BLOOD (ROUTINE X 2)  C-REACTIVE PROTEIN  LACTIC ACID, PLASMA  CBC  CREATININE, SERUM  HEMOGLOBIN A1C     EKG    RADIOLOGY XR Foot Left: Soft tissue swelling, query soft tissue gas   I also independently reviewed and agree with radiologist interpretations.   PROCEDURES:  Critical Care performed: No  .1-3 Lead EKG Interpretation  Performed by: Shaune Pollack, MD Authorized by: Shaune Pollack, MD     Interpretation: normal     ECG rate:  60-80   ECG rate assessment: normal     Rhythm: sinus rhythm     Ectopy: none     Conduction: normal   Comments:     Indication: Possible sepsis     MEDICATIONS ORDERED IN ED: Medications  morphine (PF) 4 MG/ML injection 4 mg ( Intravenous Canceled Entry 04/28/23 1858)  enoxaparin (LOVENOX) injection 40 mg (has no administration in time range)  cefTRIAXone (ROCEPHIN) 1 g in sodium chloride 0.9 % 100 mL IVPB (has no administration in time range)  vancomycin (VANCOREADY) IVPB 750 mg/150 mL (has no administration in time range)  clonazePAM (KLONOPIN) tablet  0.5 mg (has no administration in time range)  cholecalciferol (VITAMIN D3) 25 MCG (1000 UNIT) tablet 1,000 Units (has no administration in time range)  lamoTRIgine (LAMICTAL) tablet 25 mg (has no administration in time range)  melatonin tablet 5 mg (has no administration in time range)  OLANZapine (ZYPREXA) tablet 5 mg (has no administration in time range)  senna-docusate (Senokot-S) tablet 2 tablet (has no administration in time range)  sertraline (ZOLOFT) tablet 100 mg (has no administration in time range)  traZODone (DESYREL) tablet 100 mg (has no administration in time range)  feeding supplement (ENSURE ENLIVE / ENSURE PLUS) liquid 237 mL (has no administration in time range)  acetaminophen (TYLENOL) tablet 500 mg (has no administration in time range)  multivitamin with minerals tablet 1 tablet (has no administration in time range)  cefTRIAXone (ROCEPHIN) 2 g in sodium chloride 0.9 % 100 mL IVPB (0 g Intravenous Stopped 04/28/23 1857)  morphine (PF) 2 MG/ML injection 2 mg (2 mg Intravenous Given 04/28/23 1836)  ondansetron (ZOFRAN) injection 4 mg (4 mg Intravenous Given 04/28/23 1835)  sodium chloride 0.9 % bolus 500 mL (500 mLs Intravenous New Bag/Given 04/28/23 1833)  vancomycin (VANCOCIN) IVPB 1000 mg/200 mL premix (1,000 mg Intravenous New Bag/Given 04/28/23 1928)  acetaminophen (TYLENOL) tablet 1,000 mg (1,000 mg Oral Given 04/28/23 1834)     IMPRESSION / MDM / ASSESSMENT AND PLAN / ED COURSE  I reviewed the triage vital signs and the nursing notes.                              Differential diagnosis includes, but is not limited to, tinea pedis with superimposed cellulitis, primary cellulitis, abscess, vasculitis, peripheral vascular disease, CHF, DVT  Patient's presentation is most consistent with acute presentation with potential threat to life or bodily function.  The patient is on the cardiac monitor to evaluate for evidence of arrhythmia and/or significant heart rate changes    82 year old female here with significant warmth, redness, and pain to the left foot.  Exam as above, concerning for acute cellulitis likely superimposed on background tinea pedis.  Patient will be treated empirically with broad-spectrum antibiotics.  Patient has moderate leukocytosis, lactic acid is pending.  Vital signs are otherwise stable.  Patient does have mild BUN to creatinine elevation consistent with mild dehydration.  Sed  rate significant elevated at 63.  Plain films pending.  Plain films show no fracture or evidence of osteo-.  There is a question of a small amount of gas, this correlates directly with where her wound is.  Query whether this wound probes deeper, but clinically she has no evidence suggest necrotizing fasciitis.  She has had no extension of the rash or pain since arrival to the ED multiple hours ago.  Will continue empiric antibiotics and obtain MRI.  Medicine is aware of x-ray findings and plan.   FINAL CLINICAL IMPRESSION(S) / ED DIAGNOSES   Final diagnoses:  Cellulitis of left lower extremity     Rx / DC Orders   ED Discharge Orders     None        Note:  This document was prepared using Dragon voice recognition software and may include unintentional dictation errors.   Shaune Pollack, MD 04/28/23 2053

## 2023-04-28 NOTE — H&P (Signed)
History and Physical    Patient: [name] ZOX:[id number] DOB: Jul 25, 1941 DOA: 04/28/2023 DOS: the patient was seen and examined on 04/28/2023 PCP: [facility]  Patient coming from: ALF/ILF  Chief Complaint: Left foot infection Chief Complaint  Patient presents with   Wound Infection   HPI: [name] is a 82 y.o. female with medical history significant of Alzheimer's dementia who lives in assisted living facility comes in on account of left foot pain that has been going on for the last 4 days.  According to patient she started noticing redness involving the left foot with associated swelling that have not been improving.  She denied history of diabetes, or any peripheral vascular disease that she knows of.  She denies cough nausea vomiting abdominal pain or chest pain ED course: Temperature 98.1 respiratory rate 18 pulse 65 blood pressure 128/60 saturating 95% on room air.  Labs show WBC 12.0 hemoglobin 11.9 platelet 406.  Given significant findings of infection and therefore the hospitalist service was contacted to admit patient for further management. Foot x-ray was done in the emergency room with report showing soft tissue swelling with questionable soft tissue gas. MRI also requested to rule out underlying osteomyelitis however this is yet to be done  Review of Systems: As mentioned in the history of present illness. All other systems reviewed and are negative. Past Medical History:  Diagnosis Date   Alzheimer's dementia (HCC)    Insomnia    Low back pain    Vomiting    Past Surgical History:  Procedure Laterality Date   HIP ARTHROPLASTY Left 09/22/2019   Procedure: ARTHROPLASTY BIPOLAR HIP (HEMIARTHROPLASTY) RIGHT;  Surgeon: Christena Flake, MD;  Location: [facility] ORS;  Service: Orthopedics;  Laterality: Left;   VAGINAL HYSTERECTOMY     Social History:  reports that she has quit smoking. She has never used smokeless tobacco. She reports that she does not  currently use alcohol. She reports that she does not currently use drugs.  No Known Allergies  No family history on file.  Prior to Admission medications   Medication Sig Start Date End Date Taking? Authorizing Provider  acetaminophen (TYLENOL) 500 MG tablet Take 500 mg by mouth every 4 (four) hours as needed for mild pain or fever.     [provider]  alum & mag hydroxide-simeth (MAALOX/MYLANTA) 200-200-20 MG/5ML suspension Take 30 mLs by mouth 4 (four) times daily as needed for indigestion or heartburn.    [provider]  aspirin 81 MG chewable tablet Chew by mouth daily.    [provider]  calcium carbonate (OSCAL) 1500 (600 Ca) MG TABS tablet Take 1 tablet by mouth daily. 12/05/20   [provider]  cholecalciferol (VITAMIN D3) 25 MCG (1000 UT) tablet Take 1,000 Units by mouth daily.    [provider]  divalproex (DEPAKOTE) 125 MG DR tablet Take 125 mg by mouth 2 (two) times daily.    [provider]  DULoxetine (CYMBALTA) 60 MG capsule Take 60 mg by mouth daily.    [provider]  enoxaparin (LOVENOX) 40 MG/0.4ML injection Inject 0.4 mLs (40 mg total) into the skin daily. 09/24/19   Anson Oregon, PA-C  feeding supplement, ENSURE ENLIVE, (ENSURE ENLIVE) LIQD Take 237 mLs by mouth 2 (two) times daily between meals. 09/27/19   Rolly Salter, MD  lactulose, encephalopathy, (CHRONULAC) 10 GM/15ML SOLN Take 30 g by mouth daily.    [provider]  lamoTRIgine (LAMICTAL) 25 MG tablet  Take 1 tablet by mouth daily.    [provider]  magnesium hydroxide (MILK OF MAGNESIA) 400 MG/5ML suspension Take 30 mLs by mouth at bedtime as needed for mild constipation or moderate constipation.    [provider]  magnesium oxide (MAG-OX) 400 (241.3 Mg) MG tablet Take 1 tablet (400 mg total) by mouth 2 (two) times daily. 12/25/20   Lynn Ito, MD  Menthol-Zinc Oxide 0.44-20.625 % OINT Apply 1 application  topically 4 (four) times daily as needed (skin irritation).    [provider]  mirtazapine (REMERON) 30 MG tablet Take 30 mg by mouth daily.    [provider]  Multiple Vitamins-Minerals (MULTIVITAMIN ADULT, MINERALS,) TABS Take 1 tablet by mouth daily.    [provider]  neomycin-bacitracin-polymyxin (NEOSPORIN) ointment Apply 1 application topically 4 (four) times daily as needed for wound care.    [provider]  OLANZapine (ZYPREXA) 5 MG tablet Take 5 mg by mouth at bedtime.    [provider]  omeprazole (PRILOSEC) 20 MG capsule Take 20 mg by mouth daily before breakfast.    [provider]  ondansetron (ZOFRAN-ODT) 4 MG disintegrating tablet Take 4 mg by mouth every 6 (six) hours as needed for nausea or vomiting.    [provider]  polyethylene glycol (MIRALAX / GLYCOLAX) 17 g packet Take 17 g by mouth at bedtime.    [provider]  rivastigmine (EXELON) 6 MG capsule Take 6 mg by mouth 2 (two) times daily.    [provider]  rosuvastatin (CRESTOR) 10 MG tablet Take 10 mg by mouth daily.    [provider]  senna-docusate (SENOKOT-S) 8.6-50 MG tablet Take 2 tablets by mouth 2 (two) times daily.    [provider]  traZODone (DESYREL) 50 MG tablet Take 50 mg by mouth at bedtime.    [provider]    Physical Exam:  General: In no acute distress CVS: Heart sounds 1 and 2 present no murmur Respiratory system Musculoskeletal: Erythema with purulence noted involving the left foot as well as the left second toe Abdomen: Nontender moves with respiration CNS: Patient alert and awake in no acute distress, oriented x 3    Vitals:   04/28/23 1205 04/28/23 1206 04/28/23 1432  BP: 128/70  130/60  Pulse: 70  65  Resp: 18  18  Temp: 98.4 F (36.9 C)    TempSrc: Oral    SpO2: 96%  97%  Weight:  54.4 kg   Height:  5\' 7"  (1.702 m)     Data Reviewed: I personally reviewed the  patient's x-ray of the foot that showed soft tissue swelling but unable to rule out acute osteomyelitis  Have also reviewed patient's lab results as shown below    Latest Ref Rng & Units 04/28/2023   12:12 PM 03/28/2021   10:18 AM 12/25/2020    4:21 AM  CBC  WBC 4.0 - 10.5 K/uL 12.0  7.4  6.8   Hemoglobin 12.0 - 15.0 g/dL 16.1  09.6  9.9   Hematocrit 36.0 - 46.0 % 38.4  41.1  32.0   Platelets 150 - 400 K/uL 406  359  441        Latest Ref Rng & Units 04/28/2023   12:12 PM 03/28/2021   10:18 AM 12/25/2020    4:21 AM  CMP  Glucose 70 - 99 mg/dL 90  045  92   BUN 8 - 23 mg/dL 26  25  30  Creatinine 0.44 - 1.00 mg/dL 5.63  8.75  6.43   Sodium 135 - 145 mmol/L 137  140  140   Potassium 3.5 - 5.1 mmol/L 4.2  3.9  4.2   Chloride 98 - 111 mmol/L 102  108  104   CO2 22 - 32 mmol/L 23  25  30    Calcium 8.9 - 10.3 mg/dL 9.3  9.1  9.2   Total Protein 6.5 - 8.1 g/dL 6.8     Total Bilirubin 0.3 - 1.2 mg/dL 0.6     Alkaline Phos 38 - 126 U/L 77     AST 15 - 41 U/L 19     ALT 0 - 44 U/L 16       Assessment and Plan:  Left foot cellulitis to rule out underlying osteomyelitis Foot x-ray was done in the emergency room with report showing soft tissue swelling with questionable soft tissue gas. MRI also requested to rule out underlying osteomyelitis however this is yet to be done We will continue ceftriaxone and vancomycin at this time Plan of care discussed with ED physician Pending MRI report we will consider podiatry consultation Patient may have an underlying undiagnosed peripheral vascular disease Obtain hemoglobin A1c Wound care consulted  Alzheimer's dementia Will resume home medications  History of anxiety/depression Continue sertraline, clonazepam  DVT prophylaxis-continue Lovenox   Advance Care Planning:   Code Status: Prior DNI DNR  Consults: None at this time  Family Communication: Patient's contact in the chart were called however both of them were unable to  answer  Severity of Illness: The appropriate patient status for this patient is INPATIENT. Inpatient status is judged to be reasonable and necessary in order to provide the required intensity of service to ensure the patient's safety. The patient's presenting symptoms, physical exam findings, and initial radiographic and laboratory data in the context of their chronic comorbidities is felt to place them at high risk for further clinical deterioration. Furthermore, it is not anticipated that the patient will be medically stable for discharge from the hospital within 2 midnights of admission.   * I certify that at the point of admission it is my clinical judgment that the patient will require inpatient hospital care spanning beyond 2 midnights from the point of admission due to high intensity of service, high risk for further deterioration and high frequency of surveillance required.*  Author: Loyce Dys, MD 04/28/2023 5:49 PM  For on call review [email or web address].

## 2023-04-28 NOTE — Consult Note (Signed)
Pharmacy Antibiotic Note  [name] is a 82 y.o. female admitted on 04/28/2023 with cellulitis.  Pharmacy has been consulted for vancomycin dosing.  Plan: Patient will receive vancomycin 1000 mg x 1 in the ED. Will order vancomycin 750 mg q24H. Predicted AUC of 445. Goal AUC of 400-600. Vd 0.72, TBW, Scr 0.8. Plan to order vancomycin levels after 4th or 5th dose.   Start ceftriaxone 1 g daily.   Height: 5\' 7"  (170.2 cm) Weight: 54.4 kg (120 lb) IBW/kg (Calculated) : 61.6  Temp (24hrs), Avg:98.3 F (36.8 C), Min:98.1 F (36.7 C), Max:98.4 F (36.9 C)  Recent Labs  Lab 04/28/23 1212  WBC 12.0*  CREATININE 0.66    Estimated Creatinine Clearance: 45.8 mL/min (by C-G formula based on SCr of 0.66 mg/dL).    No Known Allergies  Antimicrobials this admission: 8/15 vancomycin >>  8/15 ceftriaxone >>   Dose adjustments this admission: None  Microbiology results: None.   Thank you for allowing pharmacy to be a part of this patient's care.  Ronnald Ramp, PharmD, BCPS 04/28/2023 6:15 PM

## 2023-04-28 NOTE — Progress Notes (Signed)
PHARMACY -  BRIEF ANTIBIOTIC NOTE   Pharmacy has received consult(s) for Vancomycin from an ED provider.  The patient's profile has been reviewed for ht/wt/allergies/indication/available labs.    One time order(s) placed for Vancomycin 1g   Further antibiotics/pharmacy consults should be ordered by admitting physician if indicated.                       Thank you, Gardner Candle, PharmD, BCPS Clinical Pharmacist 04/28/2023 3:34 PM

## 2023-04-28 NOTE — ED Triage Notes (Signed)
Pt from [facility] for possible infection to wound of 2nd toe on L foot. [place] present to wound bed with erythema extending up dorsum of foot. Pt denies hx of diabetes.

## 2023-04-28 NOTE — Progress Notes (Signed)
77 Dr Meriam Sprague aware of of critical lactic 2.2. No new orders. bolus given, IV abx infusing. Repeat lactic acid in 2 hours. No new orders

## 2023-04-28 NOTE — ED Triage Notes (Signed)
First Nurse Note;  Pt via [facility] from [facility]. Pt c/o second toe pain, open wound, redness, swelling, oozing. Pt alert and oriented to her baseline.  97.7  68 HR  98% on RA 146/56 BP

## 2023-04-29 DIAGNOSIS — L03116 Cellulitis of left lower limb: Secondary | ICD-10-CM

## 2023-04-29 DIAGNOSIS — G309 Alzheimer's disease, unspecified: Secondary | ICD-10-CM

## 2023-04-29 DIAGNOSIS — M86172 Other acute osteomyelitis, left ankle and foot: Secondary | ICD-10-CM

## 2023-04-29 DIAGNOSIS — M869 Osteomyelitis, unspecified: Secondary | ICD-10-CM

## 2023-04-29 DIAGNOSIS — Z79632 Long term (current) use of antitumor antibiotic: Secondary | ICD-10-CM

## 2023-04-29 DIAGNOSIS — I739 Peripheral vascular disease, unspecified: Secondary | ICD-10-CM

## 2023-04-29 DIAGNOSIS — Z87891 Personal history of nicotine dependence: Secondary | ICD-10-CM

## 2023-04-29 DIAGNOSIS — Z79899 Other long term (current) drug therapy: Secondary | ICD-10-CM

## 2023-04-29 LAB — HEMOGLOBIN A1C
Hgb A1c MFr Bld: 5.7 % — ABNORMAL HIGH (ref 4.8–5.6)
Mean Plasma Glucose: 116.89 mg/dL

## 2023-04-29 MED ORDER — POLYETHYLENE GLYCOL 3350 17 G PO PACK
17.0000 g | PACK | Freq: Every day | ORAL | Status: DC
  Administered 2023-04-30 – 2023-05-02 (×3): 17 g via ORAL
  Filled 2023-04-29 (×3): qty 1

## 2023-04-29 MED ORDER — ONDANSETRON 4 MG PO TBDP: 4.0000 mg | ORAL_TABLET | Freq: Four times a day (QID) | ORAL | Status: DC | PRN

## 2023-04-29 MED ORDER — METRONIDAZOLE 500 MG/100ML IV SOLN
500.0000 mg | Freq: Two times a day (BID) | INTRAVENOUS | Status: DC
  Administered 2023-04-29 – 2023-05-06 (×16): 500 mg via INTRAVENOUS
  Filled 2023-04-29 (×17): qty 100

## 2023-04-29 MED ORDER — OXYCODONE HCL 5 MG PO TABS
5.0000 mg | ORAL_TABLET | Freq: Four times a day (QID) | ORAL | Status: DC | PRN
  Administered 2023-04-29 – 2023-05-04 (×3): 5 mg via ORAL
  Filled 2023-04-29 (×3): qty 1

## 2023-04-29 NOTE — Plan of Care (Signed)
  Problem: Education: Goal: Knowledge of General Education information will improve Description: Including pain rating scale, medication(s)/side effects and non-pharmacologic comfort measures Outcome: Progressing   Problem: Health Behavior/Discharge Planning: Goal: Ability to manage health-related needs will improve Outcome: Progressing   Problem: Clinical Measurements: Goal: Ability to maintain clinical measurements within normal limits will improve Outcome: Progressing   Problem: Activity: Goal: Risk for activity intolerance will decrease Outcome: Progressing   Problem: Coping: Goal: Level of anxiety will decrease Outcome: Progressing   Problem: Elimination: Goal: Will not experience complications related to bowel motility Outcome: Progressing   

## 2023-04-29 NOTE — Consult Note (Signed)
PODIATRY CONSULTATION  NAME DILLON ALDIS MRN [id number] DOB 08-29-1941 DOA 04/28/2023   Reason for consult: Osteomyelitis left second toe Chief Complaint  Patient presents with   Wound Infection    Consulting physician: Kathrynn Running MD, [facility]  History of present illness: 82 y.o. female PMHx Alzheimer's dementia living in an assisted living facility admitted for worsening pain with infection to the left second toe.  Onset and duration unknown.  Increasing pain over the last 4 days.  MRI consistent for osteomyelitis of the second toe with overlying ulcer.  Podiatry was consulted.  Past Medical History:  Diagnosis Date   Alzheimer's dementia (HCC)    Insomnia    Low back pain    Vomiting        Latest Ref Rng & Units 04/28/2023    9:36 PM 04/28/2023   12:12 PM 03/28/2021   10:18 AM  CBC  WBC 4.0 - 10.5 K/uL 10.9  12.0  7.4   Hemoglobin 12.0 - 15.0 g/dL 86.5  78.4  69.6   Hematocrit 36.0 - 46.0 % 31.8  38.4  41.1   Platelets 150 - 400 K/uL 346  406  359        Latest Ref Rng & Units 04/28/2023    9:36 PM 04/28/2023   12:12 PM 03/28/2021   10:18 AM  BMP  Glucose 70 - 99 mg/dL  90  295   BUN 8 - 23 mg/dL  26  25   Creatinine 2.84 - 1.00 mg/dL 1.32  4.40  1.02   Sodium 135 - 145 mmol/L  137  140   Potassium 3.5 - 5.1 mmol/L  4.2  3.9   Chloride 98 - 111 mmol/L  102  108   CO2 22 - 32 mmol/L  23  25   Calcium 8.9 - 10.3 mg/dL  9.3  9.1       04/07/3663   Physical Exam: General: The patient is alert and oriented x3 in no acute distress.   Dermatology: Ulcer overlying the dorsal aspect of the left second digit with drainage and interdigital maceration.  No malodor.  Vascular: Erythema noted localized to the forefoot with chronic lower extremity edema  Neurological: Grossly intact via light touch  Musculoskeletal Exam: Nonambulatory in a wheelchair or bed.  No prior history of amputation  MR FOOT LEFT W WO CONTRAST 04/28/2023 IMPRESSION: 1.  Dorsal second toe ulceration with underlying osteomyelitis of the second proximal and middle phalanges. Forefoot cellulitis without abscess.  DG Foot Complete Left 04/28/2023 FINDINGS: Severe osteopenia. No acute fracture or dislocation. No definite erosive changes. There is soft tissue swelling about the midfoot and involving the second digit. Question some soft tissue gas. If there is further concern bone infection or osteomyelitis, a bone scan or MRI may be useful for further sensitivity. IMPRESSION: Soft tissue swelling.  Severe osteopenia.  Question soft tissue gas.  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  Will plan to follow peripherally until vascular optimization and recommendations determining appropriate level of amputation based on circulation. -Continue IV antibiotics ceftriaxone/bank, add Flagyl as per hospitalist -Will follow     Thank you for the consult.  Please contact me directly via secure chat with any questions or concerns.     Felecia Shelling, DPM [facility]  Dr. Felecia Shelling, DPM    [address] [place], [place] [address]                Office [phone]  Fax [phone]

## 2023-04-29 NOTE — TOC Initial Note (Signed)
Transition of Care [facility]) - Initial/Assessment Note    Patient Details  Name: [name] MRN: [id number] Date of Birth: Dec 28, 1940  Transition of [facility]) CM/SW Contact:    Liliana Cline, LCSW Phone Number: 04/29/2023, 11:55 AM  Clinical Narrative:                 Patient is from [facility] in their Memory Care Unit.  Spoke to [employer] at [employer] who states they would need to know any wound care needs to determine if they can take patient back at [place], they also cannot take patient with IV medications if she gets any. Huntley Dec states she will be the contact this weekend to see if patient can return (if medically ready). CSW also spoke with [facility] Legal Guardian Baruch Goldmann - informed her that vascular needs to contact her for consent for an angiogram, she states vascular can call her at [phone]. Updated care team.   Expected Discharge Plan: Assisted Living Barriers to Discharge: Continued Medical Work up   Patient Goals and CMS Choice   CMS Medicare.gov Compare Post Acute Care list provided to:: Legal Guardian Choice offered to / list presented to : [facility] POA / Guardian      Expected Discharge Plan and Services       Living arrangements for the past 2 months: Assisted Living Facility                                      Prior Living Arrangements/Services Living arrangements for the past 2 months: Assisted Living Facility Lives with:: Facility Resident                   Activities of Daily Living      Permission Sought/Granted                  Emotional Assessment              Admission diagnosis:  Cellulitis [L03.90] Patient Active Problem List   Diagnosis Date Noted   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) 04/29/2023   Cellulitis 04/28/2023   HCAP (healthcare-associated pneumonia) 12/19/2020   PAD (peripheral artery disease) (HCC) 12/19/2020   HLD (hyperlipidemia) 12/19/2020   Insomnia 12/19/2020    GERD (gastroesophageal reflux disease) 12/19/2020   Bipolar disorder (HCC) 12/15/2020   Dementia with behavioral disturbance (HCC) 12/15/2020   SOB (shortness of breath) 12/12/2020   Acute respiratory failure with hypoxia (HCC) 12/12/2020   Community acquired pneumonia 12/12/2020   Pressure injury of skin 09/25/2019   Fall    Closed left subtrochanteric femur fracture (HCC) 09/21/2019   PCP:  [facility] Pharmacy:  No Pharmacies Listed    Social Determinants of Health (SDOH) Social History: SDOH Screenings   Tobacco Use: Medium Risk (03/28/2021)   SDOH Interventions:     Readmission Risk Interventions    12/25/2020    3:26 PM  Readmission Risk Prevention Plan  Transportation Screening Complete  PCP or Specialist Appt within 3-5 Days Complete  HRI or Home Care Consult Complete  Social Work Consult for Recovery Care Planning/Counseling Complete  Palliative Care Screening Complete  Medication Review [employer]) Complete

## 2023-04-29 NOTE — Plan of Care (Signed)

## 2023-04-29 NOTE — Consult Note (Signed)
Hospital Consult    Reason for Consult:  Left Lower Extremity Osteomyelitis of Second toe. Requesting Physician:  Dr Shonna Chock MD.  MRN #:  [id number]  History of Present Illness: (HPI today was taken from Dr. Lelon Mast previous note as the patient was unwilling to speak with me this afternoon.) This is a 82 y.o. female with a medical history significant for Alzheimer's dementia who lives in an assisted living facility.  She was brought in due to left foot pain which has been going on for the past week.  According to the patient she started noticing redness involving the left foot associated with some swelling that had not improved.  On exam this afternoon the patient was curled in the fetal position on the bed and would not follow any commands or answer any of my questions.  All she would say that she wanted to lay down.  According to her representative at [facility] she informs me that when the patient is having a bad day this is what she does.  I do not see any distress with the patient at this time.  Vitals all remained stable  Past Medical History:  Diagnosis Date   Alzheimer's dementia (HCC)    Insomnia    Low back pain    Vomiting     Past Surgical History:  Procedure Laterality Date   HIP ARTHROPLASTY Left 09/22/2019   Procedure: ARTHROPLASTY BIPOLAR HIP (HEMIARTHROPLASTY) RIGHT;  Surgeon: Christena Flake, MD;  Location: [facility] ORS;  Service: Orthopedics;  Laterality: Left;   VAGINAL HYSTERECTOMY      No Known Allergies  Prior to Admission medications   Medication Sig Start Date End Date Taking? Authorizing Provider  acetaminophen (TYLENOL) 500 MG tablet Take 500 mg by mouth every 4 (four) hours as needed for mild pain or fever.    Yes [provider]  cholecalciferol (VITAMIN D3) 25 MCG (1000 UT) tablet Take 1,000 Units by mouth daily.   Yes [provider]  clonazePAM (KLONOPIN) 0.5 MG tablet Take 0.5 mg by mouth every 12 (twelve) hours as needed for  anxiety.   Yes [provider]  lamoTRIgine (LAMICTAL) 25 MG tablet Take 25 mg by mouth 2 (two) times daily.   Yes [provider]  melatonin 5 MG TABS Take 5 mg by mouth at bedtime.   Yes [provider]  Multiple Vitamins-Minerals (MULTIVITAMIN ADULT, MINERALS,) TABS Take 1 tablet by mouth daily.   Yes [provider]  OLANZapine (ZYPREXA) 5 MG tablet Take 5 mg by mouth at bedtime.   Yes [provider]  senna-docusate (SENOKOT-S) 8.6-50 MG tablet Take 2 tablets by mouth 2 (two) times daily.   Yes [provider]  sertraline (ZOLOFT) 100 MG tablet Take 100 mg by mouth daily. 02/21/23  Yes [provider]  traZODone (DESYREL) 100 MG tablet Take 100 mg by mouth at bedtime. 02/21/23  Yes [provider]  feeding supplement, ENSURE ENLIVE, (ENSURE ENLIVE) LIQD Take 237 mLs by mouth 2 (two) times daily between meals. 09/27/19   Rolly Salter, MD    Social History   Socioeconomic History   Marital status: Divorced    Spouse name: Not on file   Number of children: Not on file   Years of education: Not on file   Highest education level: Not on file  Occupational History   Not on file  Tobacco Use   Smoking status: Former   Smokeless tobacco: Never  Substance and Sexual  Activity   Alcohol use: Not Currently   Drug use: Not Currently   Sexual activity: Not on file  Other Topics Concern   Not on file  Social History Narrative   Not on file   Social Determinants of Health   Financial Resource Strain: Not on file  Food Insecurity: Not on file  Transportation Needs: Not on file  Physical Activity: Not on file  Stress: Not on file  Social Connections: Not on file  Intimate Partner Violence: Not on file     No family history on file.  ROS: Otherwise negative unless mentioned in HPI  Physical Examination  Vitals:   04/28/23 2133 04/29/23 0806  BP: 127/81 (!) 106/58  Pulse: (!) 59 69  Resp: 18 16  Temp: 98  F (36.7 C) 98 F (36.7 C)  SpO2: 98% 97%   Body mass index is 18.79 kg/m.  General:  WDWN in NAD Gait: Not observed HENT: WNL, normocephalic Pulmonary: normal non-labored breathing, without Rales, rhonchi,  wheezing Cardiac: regular, without  Murmurs, rubs or gallops; without carotid bruits Abdomen: Positive Bowel Sounds throughout, soft, NT/ND, no masses Skin: without rashes Vascular Exam/Pulses: Unable to palpate left lower extremity pulses from knee down to toes.  Extremities: with ischemic changes, without Gangrene , with cellulitis; with open wounds;  Musculoskeletal: no muscle wasting or atrophy  Neurologic: A&O X 3;  No focal weakness or paresthesias are detected; speech is fluent/normal Psychiatric:  The pt has Abnormal- Alzheimers Dementia Flat  affect. Lymph:  Unremarkable  CBC    Component Value Date/Time   WBC 10.9 (H) 04/28/2023 2136   RBC 3.44 (L) 04/28/2023 2136   HGB 10.1 (L) 04/28/2023 2136   HGB 14.5 10/22/2012 0051   HCT 31.8 (L) 04/28/2023 2136   HCT 44.4 10/22/2012 0051   PLT 346 04/28/2023 2136   PLT 255 10/22/2012 0051   MCV 92.4 04/28/2023 2136   MCV 95 10/22/2012 0051   MCH 29.4 04/28/2023 2136   MCHC 31.8 04/28/2023 2136   RDW 13.2 04/28/2023 2136   RDW 13.3 10/22/2012 0051   LYMPHSABS 2.7 04/28/2023 1212   MONOABS 1.4 (H) 04/28/2023 1212   EOSABS 0.4 04/28/2023 1212   BASOSABS 0.0 04/28/2023 1212    BMET    Component Value Date/Time   NA 137 04/28/2023 1212   NA 143 10/22/2012 0051   K 4.2 04/28/2023 1212   K 3.7 10/22/2012 0051   CL 102 04/28/2023 1212   CL 109 (H) 10/22/2012 0051   CO2 23 04/28/2023 1212   CO2 26 10/22/2012 0051   GLUCOSE 90 04/28/2023 1212   GLUCOSE 129 (H) 10/22/2012 0051   BUN 26 (H) 04/28/2023 1212   BUN 33 (H) 10/22/2012 0051   CREATININE 0.60 04/28/2023 2136   CREATININE 0.66 10/22/2012 0051   CALCIUM 9.3 04/28/2023 1212   CALCIUM 9.7 10/22/2012 0051   GFRNONAA >60 04/28/2023 2136   GFRNONAA >60  10/22/2012 0051   GFRAA >60 02/29/2020 1134   GFRAA >60 10/22/2012 0051    COAGS: No results found for: "INR", "PROTIME"   Non-Invasive Vascular Imaging:   EXAM:04/28/23 MRI OF THE LEFT FOREFOOT WITHOUT AND WITH CONTRAST   TECHNIQUE: Multiplanar, multisequence MR imaging of the left forefoot was performed both before and after administration of intravenous contrast.   CONTRAST:  5mL GADAVIST GADOBUTROL 1 MMOL/ML IV SOLN   COMPARISON:  Left foot x-rays from same day.   FINDINGS: Bones/Joint/Cartilage   Abnormal marrow edema and enhancement with corresponding patchy  decreased T1 marrow signal involving the second proximal phalanx head and shaft and majority of the second middle phalanx. Remaining bone marrow signal is normal.   No fracture or dislocation. Mild hallux valgus deformity. Mild first MTP and first IP joint osteoarthritis. No joint effusion.   Ligaments   Collateral ligaments are intact.  Lisfranc ligament is intact.   Muscles and Tendons Flexor and extensor tendons are intact. No tenosynovitis. Complete fatty atrophy of the intrinsic foot muscles.   Soft tissue Diffuse soft tissue swelling with mild enhancement. Dorsal second toe ulceration extending near bone. No fluid collection. No soft tissue mass.   IMPRESSION: 1. Dorsal second toe ulceration with underlying osteomyelitis of the second proximal and middle phalanges. Forefoot cellulitis without abscess.  Statin:  No. Beta Blocker:  No. Aspirin:  No. ACEI:  No. ARB:  No. CCB use:  No Other antiplatelets/anticoagulants:  No.    ASSESSMENT/PLAN: This is a 82 y.o. female with a history of advanced Alzheimer's dementia who lives in assisted living facility who comes in with left foot pain for about a week.  There is also redness to the left foot associated with swelling that has not improved.  PLAN: Patient is representative from [facility] DSS was called this afternoon.  We had a long  detailed discussion about the patient needing a left lower extremity angiogram with possible intervention.  We discussed the fact that this intervention would decide whether or not we could restore blood flow to her lower extremity but most likely would still signify that she needs either toe amputation at the minimum or possibly more of an amputation possibly below the knee or an above-the-knee amputation depending upon her current vascular situation.  She requests I emailed her some information so that she can take it to higher representatives of DSS to make the decision as to whether or not we can proceed with the angiogram.  Plan is for me to do this and Monday afternoon speak with her again about possible consent for procedure.  Current plan would be to continue with antibiotic therapy at this point until we can either get confirmation for an angiogram plan next Tuesday on 05/03/2023.   -I discussed the plan in detail with Dr. Levora Dredge MD and he agrees with the plan.   Marcie Bal [facility] 04/29/2023 11:48 AM

## 2023-04-29 NOTE — Progress Notes (Signed)
PROGRESS NOTE    [name]  ZOX:[id number] DOB: Aug 22, 1941 DOA: 04/28/2023 PCP: Almetta Lovely[facility]  Outpatient Specialists: none    Brief Narrative:   [name] is a 82 y.o. female with medical history significant of Alzheimer's dementia who lives in assisted living facility comes in on account of left foot pain that has been going on for the last 4 days.  According to patient she started noticing redness involving the left foot with associated swelling that have not been improving.  She denied history of diabetes, or any peripheral vascular disease that she knows of.  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 Sepsis by leukocytosis, lactic acidosis. Sepsis physiology resolved, lactate has normalized. - podiatry consult - continue ceftriaxone/vanc, add flagyl  # PAD Concern for this contributing to the above - vascular consult  # Bipolar disorder - cont home olanzapine, sertraline, trazodone, lamotrigine, clonazepam  # Dementia No behavioral disturbance. Resides at [facility]  # Guardianship - messages left w/ legal guardian rykiell turner   DVT prophylaxis: lovenox Code Status: dnr Family Communication: none @ bedside  Level of care: Med-Surg Status is: Inpatient Remains inpatient appropriate because: need for IV abx, surgery, etc.    Consultants:  Vascular, podiatry  Procedures: pending  Antimicrobials:  See above    Subjective: Left foot pain  Objective: Vitals:   04/28/23 1759 04/28/23 1856 04/28/23 2133 04/29/23 0806  BP: 130/67 (!) 144/60 127/81 (!) 106/58  Pulse: 87 81 (!) 59 69  Resp: 18 17 18 16   Temp: 98.1 F  (36.7 C) (!) 97.5 F (36.4 C) 98 F (36.7 C) 98 F (36.7 C)  TempSrc: Oral     SpO2: 95% (!) 85% 98% 97%  Weight:      Height:        Intake/Output Summary (Last 24 hours) at 04/29/2023 0927 Last data filed at 04/29/2023 0500 Gross per 24 hour  Intake 0 ml  Output 200 ml  Net -200 ml   Filed Weights   04/28/23 1206  Weight: 54.4 kg    Examination:  General exam: Appears calm and comfortable  Respiratory system: Clear to auscultation. Respiratory effort normal. Cardiovascular system: S1 & S2 heard, RRR. No JVD, murmurs, rubs, gallops or clicks. No pedal edema. Gastrointestinal system: Abdomen is nondistended, soft and nontender. No organomegaly or masses felt. Normal bowel sounds heard. Central nervous system: Alert and oriented. No focal neurological deficits. Extremities: Symmetric 5 x 5 power. Skin: erythema left foot to lower calf, ulcer dorsum of left first toe with purulent drainage Psychiatry: flat affect   Data Reviewed: I have personally reviewed following labs and imaging studies  CBC: Recent Labs  Lab 04/28/23 1212 04/28/23 2136  WBC 12.0* 10.9*  NEUTROABS 7.5  --   HGB 11.9* 10.1*  HCT 38.4 31.8*  MCV 94.1 92.4  PLT 406* 346   Basic Metabolic Panel: Recent Labs  Lab 04/28/23 1212 04/28/23 2136  NA 137  --   K 4.2  --   CL 102  --   CO2 23  --   GLUCOSE 90  --   BUN 26*  --  CREATININE 0.66 0.60  CALCIUM 9.3  --    GFR: Estimated Creatinine Clearance: 45.8 mL/min (by C-G formula based on SCr of 0.6 mg/dL). Liver Function Tests: Recent Labs  Lab 04/28/23 1212  AST 19  ALT 16  ALKPHOS 77  BILITOT 0.6  PROT 6.8  ALBUMIN 3.3*   No results for input(s): "LIPASE", "AMYLASE" in the last 168 hours. No results for input(s): "AMMONIA" in the last 168 hours. Coagulation Profile: No results for input(s): "INR", "PROTIME" in the last 168 hours. Cardiac Enzymes: No results for input(s): "CKTOTAL", "CKMB", "CKMBINDEX", "TROPONINI" in the last  168 hours. BNP (last 3 results) No results for input(s): "PROBNP" in the last 8760 hours. HbA1C: Recent Labs    04/28/23 2136  HGBA1C 5.7*   CBG: No results for input(s): "GLUCAP" in the last 168 hours. Lipid Profile: No results for input(s): "CHOL", "HDL", "LDLCALC", "TRIG", "CHOLHDL", "LDLDIRECT" in the last 72 hours. Thyroid Function Tests: No results for input(s): "TSH", "T4TOTAL", "FREET4", "T3FREE", "THYROIDAB" in the last 72 hours. Anemia Panel: No results for input(s): "VITAMINB12", "FOLATE", "FERRITIN", "TIBC", "IRON", "RETICCTPCT" in the last 72 hours. Urine analysis:    Component Value Date/Time   COLORURINE YELLOW (A) 03/28/2021 0956   APPEARANCEUR CLEAR (A) 03/28/2021 0956   APPEARANCEUR Clear 10/22/2012 0404   LABSPEC 1.026 03/28/2021 0956   LABSPEC 1.035 10/22/2012 0404   PHURINE 5.0 03/28/2021 0956   GLUCOSEU 50 (A) 03/28/2021 0956   GLUCOSEU Negative 10/22/2012 0404   HGBUR NEGATIVE 03/28/2021 0956   BILIRUBINUR NEGATIVE 03/28/2021 0956   BILIRUBINUR Negative 10/22/2012 0404   KETONESUR NEGATIVE 03/28/2021 0956   PROTEINUR NEGATIVE 03/28/2021 0956   UROBILINOGEN 0.2 06/14/2011 1805   NITRITE NEGATIVE 03/28/2021 0956   LEUKOCYTESUR TRACE (A) 03/28/2021 0956   LEUKOCYTESUR Negative 10/22/2012 0404   Sepsis Labs: @LABRCNTIP (procalcitonin:4,lacticidven:4)  ) Recent Results (from the past 240 hour(s))  Blood culture (routine x 2)     Status: None (Preliminary result)   Collection Time: 04/28/23  6:24 PM   Specimen: BLOOD  Result Value Ref Range Status   Specimen Description BLOOD BLOOD RIGHT ARM  Final   Special Requests   Final    BOTTLES DRAWN AEROBIC AND ANAEROBIC Blood Culture results may not be optimal due to an inadequate volume of blood received in culture bottles   Culture   Final    NO GROWTH < 12 HOURS Performed at [facility], [address] [place], [place] [address]    Report Status PENDING  Incomplete  Blood culture (routine  x 2)     Status: None (Preliminary result)   Collection Time: 04/28/23  6:24 PM   Specimen: BLOOD  Result Value Ref Range Status   Specimen Description BLOOD BLOOD RIGHT FOREARM  Final   Special Requests   Final    BOTTLES DRAWN AEROBIC AND ANAEROBIC Blood Culture results may not be optimal due to an inadequate volume of blood received in culture bottles   Culture   Final    NO GROWTH < 12 HOURS Performed at [facility], [address], [place], [place] [address]    Report Status PENDING  Incomplete         Radiology Studies: MR FOOT LEFT W WO CONTRAST  Result Date: 04/29/2023 CLINICAL DATA:  Left foot infection.  Left second toe wound. EXAM: MRI OF THE LEFT FOREFOOT WITHOUT AND WITH CONTRAST TECHNIQUE: Multiplanar, multisequence MR imaging of the left forefoot was performed both before and after administration of intravenous  contrast. CONTRAST:  5mL GADAVIST GADOBUTROL 1 MMOL/ML IV SOLN COMPARISON:  Left foot x-rays from same day. FINDINGS: Bones/Joint/Cartilage Abnormal marrow edema and enhancement with corresponding patchy decreased T1 marrow signal involving the second proximal phalanx head and shaft and majority of the second middle phalanx. Remaining bone marrow signal is normal. No fracture or dislocation. Mild hallux valgus deformity. Mild first MTP and first IP joint osteoarthritis. No joint effusion. Ligaments Collateral ligaments are intact.  Lisfranc ligament is intact. Muscles and Tendons Flexor and extensor tendons are intact. No tenosynovitis. Complete fatty atrophy of the intrinsic foot muscles. Soft tissue Diffuse soft tissue swelling with mild enhancement. Dorsal second toe ulceration extending near bone. No fluid collection. No soft tissue mass. IMPRESSION: 1. Dorsal second toe ulceration with underlying osteomyelitis of the second proximal and middle phalanges. Forefoot cellulitis without abscess. Electronically Signed   By: Obie Dredge M.D.   On: 04/29/2023  08:04   DG Foot Complete Left  Result Date: 04/28/2023 CLINICAL DATA:  Infection with wound along the second toe of the left foot. Erythema. EXAM: LEFT FOOT - COMPLETE 3 VIEW COMPARISON:  None Available. FINDINGS: Severe osteopenia. No acute fracture or dislocation. No definite erosive changes. There is soft tissue swelling about the midfoot and involving the second digit. Question some soft tissue gas. If there is further concern bone infection or osteomyelitis, a bone scan or MRI may be useful for further sensitivity. IMPRESSION: Soft tissue swelling.  Severe osteopenia.  Question soft tissue gas. Electronically Signed   By: Karen Kays M.D.   On: 04/28/2023 17:31        Scheduled Meds:  cholecalciferol  1,000 Units Oral Daily   enoxaparin (LOVENOX) injection  40 mg Subcutaneous Q24H   feeding supplement  237 mL Oral BID BM   lamoTRIgine  25 mg Oral BID   melatonin  5 mg Oral QHS    morphine injection  4 mg Intravenous Once   multivitamin with minerals  1 tablet Oral Daily   OLANZapine  5 mg Oral QHS   senna-docusate  2 tablet Oral BID   sertraline  100 mg Oral Daily   traZODone  100 mg Oral QHS   Continuous Infusions:  cefTRIAXone (ROCEPHIN)  IV     vancomycin       LOS: 1 day     Silvano Bilis, MD [facility]   If 7PM-7AM, please contact night-coverage [email or web address] Password [facility] 04/29/2023, 9:27 AM

## 2023-04-29 NOTE — Progress Notes (Signed)
PHARMACY - PHYSICIAN COMMUNICATION CRITICAL VALUE ALERT - BLOOD CULTURE IDENTIFICATION (BCID)  [name] is an 82 y.o. female who presented to [facility] on 04/28/2023 with a chief complaint of Osteomyelitis  Assessment:  Staph species in 1 of 4 bottles (anaerobic).  Most likely contaminant. (include suspected source if known)  Name of physician (or Provider) Contacted: Manuela Schwartz, NP   Current antibiotics: Vanc, Ceftriaxone , Flagyl   Changes to prescribed antibiotics recommended:  Patient is on recommended antibiotics - No changes needed - Will continue pt on current abx and defer to day shift for further changes   No results found for this or any previous visit.  Daisuke Bailey D 04/29/2023  10:59 PM

## 2023-04-30 DIAGNOSIS — M869 Osteomyelitis, unspecified: Secondary | ICD-10-CM | POA: Diagnosis not present

## 2023-04-30 LAB — BLOOD CULTURE ID PANEL (REFLEXED) - BCID2

## 2023-04-30 LAB — BASIC METABOLIC PANEL
Anion gap: 6 (ref 5–15)
BUN: 18 mg/dL (ref 8–23)
CO2: 23 mmol/L (ref 22–32)
Calcium: 8.9 mg/dL (ref 8.9–10.3)
Chloride: 109 mmol/L (ref 98–111)
Creatinine, Ser: 0.64 mg/dL (ref 0.44–1.00)
GFR, Estimated: >60 mL/min (ref >60)
Glucose, Bld: 84 mg/dL (ref 70–99)
Potassium: 4.1 mmol/L (ref 3.5–5.1)
Sodium: 138 mmol/L (ref 135–145)

## 2023-04-30 MED ORDER — SODIUM CHLORIDE 0.9 % IV SOLN
1.0000 g | INTRAVENOUS | Status: DC
  Administered 2023-04-30 – 2023-05-06 (×7): 1 g via INTRAVENOUS
  Filled 2023-04-30 (×7): qty 10

## 2023-04-30 MED ORDER — SODIUM CHLORIDE 0.9 % IV SOLN
2.0000 g | INTRAVENOUS | Status: DC
  Filled 2023-04-30: qty 20

## 2023-04-30 NOTE — Plan of Care (Signed)

## 2023-04-30 NOTE — Plan of Care (Signed)
  Problem: Education: Goal: Knowledge of General Education information will improve Description: Including pain rating scale, medication(s)/side effects and non-pharmacologic comfort measures Outcome: Progressing   Problem: Health Behavior/Discharge Planning: Goal: Ability to manage health-related needs will improve Outcome: Progressing   Problem: Clinical Measurements: Goal: Ability to maintain clinical measurements within normal limits will improve Outcome: Progressing   Problem: Nutrition: Goal: Adequate nutrition will be maintained Outcome: Progressing   Problem: Coping: Goal: Level of anxiety will decrease Outcome: Progressing   Problem: Pain Managment: Goal: General experience of comfort will improve Outcome: Progressing   Problem: Safety: Goal: Ability to remain free from injury will improve Outcome: Progressing   

## 2023-04-30 NOTE — Progress Notes (Signed)
PROGRESS NOTE    [name]  MVH:[id number] DOB: 05-Jun-1941 DOA: 04/28/2023 PCP: Almetta Lovely, [facility]  Outpatient Specialists: none    Brief Narrative:   [name] is a 82 y.o. female with medical history significant of Alzheimer's dementia who lives in assisted living facility comes in on account of left foot pain that has been going on for the last 4 days.  According to patient she started noticing redness involving the left foot with associated swelling that have not been improving.  She denied history of diabetes, or any peripheral vascular disease that she knows of.  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 Sepsis by leukocytosis, lactic acidosis. Sepsis physiology resolved, lactate has normalized. - podiatry consulted, plan for amputation after vascular optimization - continue ceftriaxone/vanc/flagyl  # PAD Vascular consulted, needs angiogram but this is on hold pending DSS approval  # Bipolar disorder - cont home olanzapine, sertraline, trazodone, lamotrigine, clonazepam  # Dementia No behavioral disturbance. Resides at [facility]  # Guardianship - DSS guardian determining whether to proceed w/ angiogram. This procedure is clearly in the patient's best interest and risks outweigh benefits.   DVT prophylaxis: lovenox Code Status: dnr Family Communication: none @ bedside  Level of care: Med-Surg Status is: Inpatient Remains inpatient appropriate because: need for IV abx, surgery, etc.    Consultants:  Vascular, podiatry  Procedures: pending  Antimicrobials:  See above    Subjective: Left foot pain  Objective: Vitals:    04/29/23 0806 04/29/23 1701 04/29/23 2308 04/30/23 0749  BP: (!) 106/58 (!) 108/41 (!) 96/41 (!) 116/53  Pulse: 69 71 66 67  Resp: 16 18 16 16   Temp: 98 F (36.7 C)  97.6 F (36.4 C) 98.1 F (36.7 C)  TempSrc:      SpO2: 97% 93% 96% 98%  Weight:      Height:        Intake/Output Summary (Last 24 hours) at 04/30/2023 1339 Last data filed at 04/30/2023 0900 Gross per 24 hour  Intake 590 ml  Output 650 ml  Net -60 ml   Filed Weights   04/28/23 1206  Weight: 54.4 kg    Examination:  General exam: Appears calm and comfortable  Respiratory system: Clear to auscultation. Respiratory effort normal. Cardiovascular system: S1 & S2 heard, RRR. No JVD, murmurs, rubs, gallops or clicks. No pedal edema. Gastrointestinal system: Abdomen is nondistended, soft and nontender. No organomegaly or masses felt. Normal bowel sounds heard. Central nervous system: Alert and oriented. No focal neurological deficits. Extremities: Symmetric 5 x 5 power. Skin: erythema left foot to lower calf, dressing on left foot Psychiatry: flat affect   Data Reviewed: I have personally reviewed following labs and imaging studies  CBC: Recent Labs  Lab 04/28/23 1212 04/28/23 2136  WBC 12.0* 10.9*  NEUTROABS 7.5  --   HGB 11.9* 10.1*  HCT 38.4 31.8*  MCV 94.1 92.4  PLT 406* 346   Basic Metabolic Panel: Recent Labs  Lab 04/28/23 1212 04/28/23 2136 04/30/23 0555  NA 137  --  138  K 4.2  --  4.1  CL 102  --  109  CO2 23  --  23  GLUCOSE 90  --  84  BUN 26*  --  18  CREATININE 0.66 0.60 0.64  CALCIUM 9.3  --  8.9   GFR: Estimated Creatinine Clearance: 45.8 mL/min (by C-G formula based on SCr of 0.64 mg/dL). Liver Function Tests: Recent Labs  Lab 04/28/23 1212  AST 19  ALT 16  ALKPHOS 77  BILITOT 0.6  PROT 6.8  ALBUMIN 3.3*   No results for input(s): "LIPASE", "AMYLASE" in the last 168 hours. No results for input(s): "AMMONIA" in the last 168 hours. Coagulation Profile: No results for  input(s): "INR", "PROTIME" in the last 168 hours. Cardiac Enzymes: No results for input(s): "CKTOTAL", "CKMB", "CKMBINDEX", "TROPONINI" in the last 168 hours. BNP (last 3 results) No results for input(s): "PROBNP" in the last 8760 hours. HbA1C: Recent Labs    04/28/23 2136  HGBA1C 5.7*   CBG: No results for input(s): "GLUCAP" in the last 168 hours. Lipid Profile: No results for input(s): "CHOL", "HDL", "LDLCALC", "TRIG", "CHOLHDL", "LDLDIRECT" in the last 72 hours. Thyroid Function Tests: No results for input(s): "TSH", "T4TOTAL", "FREET4", "T3FREE", "THYROIDAB" in the last 72 hours. Anemia Panel: No results for input(s): "VITAMINB12", "FOLATE", "FERRITIN", "TIBC", "IRON", "RETICCTPCT" in the last 72 hours. Urine analysis:    Component Value Date/Time   COLORURINE YELLOW (A) 03/28/2021 0956   APPEARANCEUR CLEAR (A) 03/28/2021 0956   APPEARANCEUR Clear 10/22/2012 0404   LABSPEC 1.026 03/28/2021 0956   LABSPEC 1.035 10/22/2012 0404   PHURINE 5.0 03/28/2021 0956   GLUCOSEU 50 (A) 03/28/2021 0956   GLUCOSEU Negative 10/22/2012 0404   HGBUR NEGATIVE 03/28/2021 0956   BILIRUBINUR NEGATIVE 03/28/2021 0956   BILIRUBINUR Negative 10/22/2012 0404   KETONESUR NEGATIVE 03/28/2021 0956   PROTEINUR NEGATIVE 03/28/2021 0956   UROBILINOGEN 0.2 06/14/2011 1805   NITRITE NEGATIVE 03/28/2021 0956   LEUKOCYTESUR TRACE (A) 03/28/2021 0956   LEUKOCYTESUR Negative 10/22/2012 0404   Sepsis Labs: @LABRCNTIP (procalcitonin:4,lacticidven:4)  ) Recent Results (from the past 240 hour(s))  Blood culture (routine x 2)     Status: None (Preliminary result)   Collection Time: 04/28/23  6:24 PM   Specimen: BLOOD  Result Value Ref Range Status   Specimen Description   Final    BLOOD BLOOD RIGHT ARM Performed at [facility], [address] [place], [place] [address]    Special Requests   Final    BOTTLES DRAWN AEROBIC AND ANAEROBIC Blood Culture results may not be optimal due to an  inadequate volume of blood received in culture bottles Performed at [facility], [address] [place], [place] [address]    Culture  Setup Time   Final    GRAM POSITIVE COCCI ANAEROBIC BOTTLE ONLY STAPHYLOCOCCUS SPECIES CRITICAL RESULT CALLED TO, READ BACK BY AND VERIFIED WITH: NATHAN BELEU @ 2132 04/29/23 [facility]    Culture GRAM POSITIVE COCCI  Final   Report Status PENDING  Incomplete  Blood culture (routine x 2)     Status: None (Preliminary result)   Collection Time: 04/28/23  6:24 PM   Specimen: BLOOD  Result Value Ref Range Status   Specimen Description BLOOD BLOOD RIGHT FOREARM  Final   Special Requests   Final    BOTTLES DRAWN AEROBIC AND ANAEROBIC Blood Culture results may not be optimal due to an inadequate volume of blood received in culture bottles   Culture   Final    NO GROWTH 2 DAYS Performed at [facility], [address], [place], [place] [address]  Report Status PENDING  Incomplete  Blood Culture ID Panel (Reflexed)     Status: Abnormal   Collection Time: 04/28/23  6:24 PM  Result Value Ref Range Status   Enterococcus faecalis NOT DETECTED NOT DETECTED Final   Enterococcus Faecium NOT DETECTED NOT DETECTED Final   Listeria monocytogenes NOT DETECTED NOT DETECTED Final   Staphylococcus species DETECTED (A) NOT DETECTED Final    Comment: CRITICAL RESULT CALLED TO, READ BACK BY AND VERIFIED WITH: NATHAN BELUE @ 2132 04/29/23 [facility]    Staphylococcus aureus (BCID) NOT DETECTED NOT DETECTED Final   Staphylococcus epidermidis NOT DETECTED NOT DETECTED Final   Staphylococcus lugdunensis NOT DETECTED NOT DETECTED Final   Streptococcus species NOT DETECTED NOT DETECTED Final   Streptococcus agalactiae NOT DETECTED NOT DETECTED Final   Streptococcus pneumoniae NOT DETECTED NOT DETECTED Final   Streptococcus pyogenes NOT DETECTED NOT DETECTED Final   A.calcoaceticus-baumannii NOT DETECTED NOT DETECTED Final   Bacteroides fragilis NOT DETECTED NOT  DETECTED Final   Enterobacterales NOT DETECTED NOT DETECTED Final   Enterobacter cloacae complex NOT DETECTED NOT DETECTED Final   Escherichia coli NOT DETECTED NOT DETECTED Final   Klebsiella aerogenes NOT DETECTED NOT DETECTED Final   Klebsiella oxytoca NOT DETECTED NOT DETECTED Final   Klebsiella pneumoniae NOT DETECTED NOT DETECTED Final   Proteus species NOT DETECTED NOT DETECTED Final   Salmonella species NOT DETECTED NOT DETECTED Final   Serratia marcescens NOT DETECTED NOT DETECTED Final   Haemophilus influenzae NOT DETECTED NOT DETECTED Final   Neisseria meningitidis NOT DETECTED NOT DETECTED Final   Pseudomonas aeruginosa NOT DETECTED NOT DETECTED Final   Stenotrophomonas maltophilia NOT DETECTED NOT DETECTED Final   Candida albicans NOT DETECTED NOT DETECTED Final   Candida auris NOT DETECTED NOT DETECTED Final   Candida glabrata NOT DETECTED NOT DETECTED Final   Candida krusei NOT DETECTED NOT DETECTED Final   Candida parapsilosis NOT DETECTED NOT DETECTED Final   Candida tropicalis NOT DETECTED NOT DETECTED Final   Cryptococcus neoformans/gattii NOT DETECTED NOT DETECTED Final    Comment: Performed at [facility], [address] [place], [place] [address]         Radiology Studies: MR FOOT LEFT W WO CONTRAST  Result Date: 04/29/2023 CLINICAL DATA:  Left foot infection.  Left second toe wound. EXAM: MRI OF THE LEFT FOREFOOT WITHOUT AND WITH CONTRAST TECHNIQUE: Multiplanar, multisequence MR imaging of the left forefoot was performed both before and after administration of intravenous contrast. CONTRAST:  5mL GADAVIST GADOBUTROL 1 MMOL/ML IV SOLN COMPARISON:  Left foot x-rays from same day. FINDINGS: Bones/Joint/Cartilage Abnormal marrow edema and enhancement with corresponding patchy decreased T1 marrow signal involving the second proximal phalanx head and shaft and majority of the second middle phalanx. Remaining bone marrow signal is normal. No fracture or  dislocation. Mild hallux valgus deformity. Mild first MTP and first IP joint osteoarthritis. No joint effusion. Ligaments Collateral ligaments are intact.  Lisfranc ligament is intact. Muscles and Tendons Flexor and extensor tendons are intact. No tenosynovitis. Complete fatty atrophy of the intrinsic foot muscles. Soft tissue Diffuse soft tissue swelling with mild enhancement. Dorsal second toe ulceration extending near bone. No fluid collection. No soft tissue mass. IMPRESSION: 1. Dorsal second toe ulceration with underlying osteomyelitis of the second proximal and middle phalanges. Forefoot cellulitis without abscess. Electronically Signed   By: Obie Dredge M.D.   On: 04/29/2023 08:04   DG Foot Complete Left  Result Date: 04/28/2023 CLINICAL DATA:  Infection with wound  along the second toe of the left foot. Erythema. EXAM: LEFT FOOT - COMPLETE 3 VIEW COMPARISON:  None Available. FINDINGS: Severe osteopenia. No acute fracture or dislocation. No definite erosive changes. There is soft tissue swelling about the midfoot and involving the second digit. Question some soft tissue gas. If there is further concern bone infection or osteomyelitis, a bone scan or MRI may be useful for further sensitivity. IMPRESSION: Soft tissue swelling.  Severe osteopenia.  Question soft tissue gas. Electronically Signed   By: Karen Kays M.D.   On: 04/28/2023 17:31        Scheduled Meds:  cholecalciferol  1,000 Units Oral Daily   enoxaparin (LOVENOX) injection  40 mg Subcutaneous Q24H   feeding supplement  237 mL Oral BID BM   lamoTRIgine  25 mg Oral BID   melatonin  5 mg Oral QHS   multivitamin with minerals  1 tablet Oral Daily   OLANZapine  5 mg Oral QHS   polyethylene glycol  17 g Oral Daily   senna-docusate  2 tablet Oral BID   sertraline  100 mg Oral Daily   traZODone  100 mg Oral QHS   Continuous Infusions:  cefTRIAXone (ROCEPHIN)  IV     metronidazole 500 mg (04/30/23 0954)   vancomycin 750 mg  (04/29/23 1712)     LOS: 2 days     Silvano Bilis, MD [facility]   If 7PM-7AM, please contact night-coverage [email or web address] Password [facility] 04/30/2023, 1:39 PM

## 2023-05-01 DIAGNOSIS — M869 Osteomyelitis, unspecified: Secondary | ICD-10-CM | POA: Diagnosis not present

## 2023-05-01 NOTE — Progress Notes (Signed)
PROGRESS NOTE    [name]  WUJ:[id number] DOB: 13-Oct-1940 DOA: 04/28/2023 PCP: Almetta Lovely, [facility]  Outpatient Specialists: none    Brief Narrative:   [name] is a 82 y.o. female with medical history significant of Alzheimer's dementia who lives in assisted living facility comes in on account of left foot pain that has been going on for the last 4 days.  According to patient she started noticing redness involving the left foot with associated swelling that have not been improving.  She denied history of diabetes, or any peripheral vascular disease that she knows of.  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 Sepsis by leukocytosis, lactic acidosis. Sepsis physiology resolved, lactate has normalized. - podiatry consulted, plan for amputation after vascular optimization - continue ceftriaxone/vanc/flagyl  # PAD Vascular consulted, needs angiogram but this is on hold pending DSS approval  # Bipolar disorder - cont home olanzapine, sertraline, trazodone, lamotrigine, clonazepam  # Dementia No behavioral disturbance. Resides at [facility]  # Guardianship - DSS guardian determining whether to proceed w/ angiogram. This procedure is clearly in the patient's best interest and risks outweigh benefits.   DVT prophylaxis: lovenox Code Status: dnr Family Communication: none @ bedside  Level of care: Med-Surg Status is: Inpatient Remains inpatient appropriate because: need for IV abx, surgery, etc.    Consultants:  Vascular, podiatry  Procedures: pending  Antimicrobials:  See above    Subjective: Left foot pain mild, wants to be left alone to  take a nap  Objective: Vitals:   04/30/23 0749 04/30/23 1443 04/30/23 2342 05/01/23 0700  BP: (!) 116/53 (!) 101/53 (!) 102/42 (!) 103/56  Pulse: 67 69 71 65  Resp: 16 16 18 18   Temp: 98.1 F (36.7 C) 98.9 F (37.2 C) 98.3 F (36.8 C) 97.8 F (36.6 C)  TempSrc:      SpO2: 98% 97% 94% 94%  Weight:      Height:        Intake/Output Summary (Last 24 hours) at 05/01/2023 1304 Last data filed at 05/01/2023 1208 Gross per 24 hour  Intake 0 ml  Output 550 ml  Net -550 ml   Filed Weights   04/28/23 1206  Weight: 54.4 kg    Examination:  General exam: Appears calm and comfortable  Respiratory system: Clear to auscultation. Respiratory effort normal. Cardiovascular system: S1 & S2 heard, RRR. No JVD, murmurs, rubs, gallops or clicks. No pedal edema. Gastrointestinal system: Abdomen is nondistended, soft and nontender. No organomegaly or masses felt. Normal bowel sounds heard. Central nervous system: Alert and oriented. No focal neurological deficits. Extremities: Symmetric 5 x 5 power. Skin: erythema left foot to lower calf, dressing on left foot Psychiatry: flat affect   Data Reviewed: I have personally reviewed following labs and imaging studies  CBC: Recent Labs  Lab 04/28/23 1212 04/28/23 2136  WBC 12.0* 10.9*  NEUTROABS 7.5  --   HGB 11.9* 10.1*  HCT 38.4 31.8*  MCV 94.1 92.4  PLT 406* 346   Basic Metabolic Panel: Recent Labs  Lab 04/28/23 1212 04/28/23 2136 04/30/23 0555  NA 137  --  138  K 4.2  --  4.1  CL 102  --  109  CO2 23  --  23  GLUCOSE 90  --  84  BUN 26*  --  18  CREATININE 0.66 0.60 0.64  CALCIUM 9.3  --  8.9   GFR: Estimated Creatinine Clearance: 45.8 mL/min (by C-G formula based on SCr of 0.64 mg/dL). Liver Function Tests: Recent Labs  Lab 04/28/23 1212  AST 19  ALT 16  ALKPHOS 77  BILITOT 0.6  PROT 6.8  ALBUMIN 3.3*   No results for input(s): "LIPASE", "AMYLASE" in the last 168 hours. No results for input(s): "AMMONIA" in  the last 168 hours. Coagulation Profile: No results for input(s): "INR", "PROTIME" in the last 168 hours. Cardiac Enzymes: No results for input(s): "CKTOTAL", "CKMB", "CKMBINDEX", "TROPONINI" in the last 168 hours. BNP (last 3 results) No results for input(s): "PROBNP" in the last 8760 hours. HbA1C: Recent Labs    04/28/23 2136  HGBA1C 5.7*   CBG: No results for input(s): "GLUCAP" in the last 168 hours. Lipid Profile: No results for input(s): "CHOL", "HDL", "LDLCALC", "TRIG", "CHOLHDL", "LDLDIRECT" in the last 72 hours. Thyroid Function Tests: No results for input(s): "TSH", "T4TOTAL", "FREET4", "T3FREE", "THYROIDAB" in the last 72 hours. Anemia Panel: No results for input(s): "VITAMINB12", "FOLATE", "FERRITIN", "TIBC", "IRON", "RETICCTPCT" in the last 72 hours. Urine analysis:    Component Value Date/Time   COLORURINE YELLOW (A) 03/28/2021 0956   APPEARANCEUR CLEAR (A) 03/28/2021 0956   APPEARANCEUR Clear 10/22/2012 0404   LABSPEC 1.026 03/28/2021 0956   LABSPEC 1.035 10/22/2012 0404   PHURINE 5.0 03/28/2021 0956   GLUCOSEU 50 (A) 03/28/2021 0956   GLUCOSEU Negative 10/22/2012 0404   HGBUR NEGATIVE 03/28/2021 0956   BILIRUBINUR NEGATIVE 03/28/2021 0956   BILIRUBINUR Negative 10/22/2012 0404   KETONESUR NEGATIVE 03/28/2021 0956   PROTEINUR NEGATIVE 03/28/2021 0956   UROBILINOGEN 0.2 06/14/2011 1805   NITRITE NEGATIVE 03/28/2021 0956   LEUKOCYTESUR TRACE (A) 03/28/2021 0956   LEUKOCYTESUR Negative 10/22/2012 0404   Sepsis Labs: @LABRCNTIP (procalcitonin:4,lacticidven:4)  ) Recent Results (from the past 240 hour(s))  Blood culture (routine x 2)     Status: Abnormal (Preliminary result)   Collection Time: 04/28/23  6:24 PM   Specimen: BLOOD  Result Value Ref Range Status   Specimen Description   Final    BLOOD BLOOD RIGHT ARM Performed at [facility], [address] [place], [place] [address]    Special Requests   Final    BOTTLES DRAWN AEROBIC AND  ANAEROBIC Blood Culture results may not be optimal due to an inadequate volume of blood received in culture bottles Performed at [facility], [address] [place], [place] [address]    Culture  Setup Time   Final    GRAM POSITIVE COCCI ANAEROBIC BOTTLE ONLY STAPHYLOCOCCUS SPECIES CRITICAL RESULT CALLED TO, READ BACK BY AND VERIFIED WITH: NATHAN BELEU @ 2132 04/29/23 [facility]    Culture (A)  Final    STAPHYLOCOCCUS HOMINIS THE SIGNIFICANCE OF ISOLATING THIS ORGANISM FROM A SINGLE SET OF BLOOD CULTURES WHEN MULTIPLE SETS ARE DRAWN IS UNCERTAIN. PLEASE NOTIFY THE MICROBIOLOGY DEPARTMENT WITHIN ONE WEEK IF SPECIATION AND SENSITIVITIES ARE REQUIRED. Performed at [facility], [address], [place], [place] [address]    Report Status PENDING  Incomplete  Blood culture (routine x 2)     Status: None (Preliminary result)   Collection Time: 04/28/23  6:24 PM   Specimen: BLOOD  Result Value Ref Range Status   Specimen Description BLOOD  BLOOD RIGHT FOREARM  Final   Special Requests   Final    BOTTLES DRAWN AEROBIC AND ANAEROBIC Blood Culture results may not be optimal due to an inadequate volume of blood received in culture bottles   Culture   Final    NO GROWTH 3 DAYS Performed at [facility], [address] [place], [place] [address]    Report Status PENDING  Incomplete  Blood Culture ID Panel (Reflexed)     Status: Abnormal   Collection Time: 04/28/23  6:24 PM  Result Value Ref Range Status   Enterococcus faecalis NOT DETECTED NOT DETECTED Final   Enterococcus Faecium NOT DETECTED NOT DETECTED Final   Listeria monocytogenes NOT DETECTED NOT DETECTED Final   Staphylococcus species DETECTED (A) NOT DETECTED Final    Comment: CRITICAL RESULT CALLED TO, READ BACK BY AND VERIFIED WITH: NATHAN BELUE @ 2132 04/29/23 [facility]    Staphylococcus aureus (BCID) NOT DETECTED NOT DETECTED Final   Staphylococcus epidermidis NOT DETECTED NOT DETECTED Final   Staphylococcus  lugdunensis NOT DETECTED NOT DETECTED Final   Streptococcus species NOT DETECTED NOT DETECTED Final   Streptococcus agalactiae NOT DETECTED NOT DETECTED Final   Streptococcus pneumoniae NOT DETECTED NOT DETECTED Final   Streptococcus pyogenes NOT DETECTED NOT DETECTED Final   A.calcoaceticus-baumannii NOT DETECTED NOT DETECTED Final   Bacteroides fragilis NOT DETECTED NOT DETECTED Final   Enterobacterales NOT DETECTED NOT DETECTED Final   Enterobacter cloacae complex NOT DETECTED NOT DETECTED Final   Escherichia coli NOT DETECTED NOT DETECTED Final   Klebsiella aerogenes NOT DETECTED NOT DETECTED Final   Klebsiella oxytoca NOT DETECTED NOT DETECTED Final   Klebsiella pneumoniae NOT DETECTED NOT DETECTED Final   Proteus species NOT DETECTED NOT DETECTED Final   Salmonella species NOT DETECTED NOT DETECTED Final   Serratia marcescens NOT DETECTED NOT DETECTED Final   Haemophilus influenzae NOT DETECTED NOT DETECTED Final   Neisseria meningitidis NOT DETECTED NOT DETECTED Final   Pseudomonas aeruginosa NOT DETECTED NOT DETECTED Final   Stenotrophomonas maltophilia NOT DETECTED NOT DETECTED Final   Candida albicans NOT DETECTED NOT DETECTED Final   Candida auris NOT DETECTED NOT DETECTED Final   Candida glabrata NOT DETECTED NOT DETECTED Final   Candida krusei NOT DETECTED NOT DETECTED Final   Candida parapsilosis NOT DETECTED NOT DETECTED Final   Candida tropicalis NOT DETECTED NOT DETECTED Final   Cryptococcus neoformans/gattii NOT DETECTED NOT DETECTED Final    Comment: Performed at [facility], [address], [place], [place] [address]         Radiology Studies: No results found.      Scheduled Meds:  cholecalciferol  1,000 Units Oral Daily   enoxaparin (LOVENOX) injection  40 mg Subcutaneous Q24H   feeding supplement  237 mL Oral BID BM   lamoTRIgine  25 mg Oral BID   melatonin  5 mg Oral QHS   multivitamin with minerals  1 tablet Oral Daily   OLANZapine   5 mg Oral QHS   polyethylene glycol  17 g Oral Daily   senna-docusate  2 tablet Oral BID   sertraline  100 mg Oral Daily   traZODone  100 mg Oral QHS   Continuous Infusions:  cefTRIAXone (ROCEPHIN)  IV 1 g (04/30/23 1721)   metronidazole 500 mg (05/01/23 1123)   vancomycin 750 mg (04/30/23 1721)     LOS: 3 days     Silvano Bilis, MD [facility]   If 7PM-7AM, please contact night-coverage [email or web address] Password  [email or web address] 05/01/2023, 1:04 PM

## 2023-05-01 NOTE — Plan of Care (Signed)
  Problem: Education: Goal: Knowledge of General Education information will improve Description: Including pain rating scale, medication(s)/side effects and non-pharmacologic comfort measures Outcome: Progressing   Problem: Health Behavior/Discharge Planning: Goal: Ability to manage health-related needs will improve Outcome: Progressing   Problem: Clinical Measurements: Goal: Ability to maintain clinical measurements within normal limits will improve Outcome: Progressing   Problem: Nutrition: Goal: Adequate nutrition will be maintained Outcome: Progressing   Problem: Coping: Goal: Level of anxiety will decrease Outcome: Progressing   Problem: Safety: Goal: Ability to remain free from injury will improve Outcome: Progressing   

## 2023-05-02 DIAGNOSIS — M869 Osteomyelitis, unspecified: Secondary | ICD-10-CM | POA: Diagnosis not present

## 2023-05-02 LAB — CBC
HCT: 33.5 % — ABNORMAL LOW (ref 36.0–46.0)
Hemoglobin: 10.7 g/dL — ABNORMAL LOW (ref 12.0–15.0)
MCH: 29.3 pg (ref 26.0–34.0)
MCHC: 31.9 g/dL (ref 30.0–36.0)
MCV: 91.8 fL (ref 80.0–100.0)
Platelets: 408 10*3/uL — ABNORMAL HIGH (ref 150–400)
RBC: 3.65 MIL/uL — ABNORMAL LOW (ref 3.87–5.11)
RDW: 12.7 % (ref 11.5–15.5)
WBC: 7.3 10*3/uL (ref 4.0–10.5)
nRBC: 0 % (ref 0.0–0.2)

## 2023-05-02 LAB — CREATININE, SERUM
Creatinine, Ser: 0.53 mg/dL (ref 0.44–1.00)
GFR, Estimated: >60 mL/min (ref >60)

## 2023-05-02 LAB — CULTURE, BLOOD (ROUTINE X 2)

## 2023-05-02 MED ORDER — POLYETHYLENE GLYCOL 3350 17 G PO PACK
34.0000 g | PACK | Freq: Every day | ORAL | Status: DC
  Administered 2023-05-03 – 2023-05-05 (×2): 34 g via ORAL
  Filled 2023-05-02 (×4): qty 2

## 2023-05-02 NOTE — Consult Note (Signed)
Pharmacy Antibiotic Note  [name] is a 82 y.o. female admitted on 04/28/2023 with cellulitis. PMH significant for bipolar disorder, dementia, PAD, HLD, GERD. MRI of foot revealed ulceration with underlying osteomyelitis of the toes and forefoot cellulitis without abscess. Amputation pending evaluation by vascular and podiatry. Pharmacy has been consulted for vancomycin dosing.  Plan: Day 5 of antibiotics Continue vancomycin 750 mg IV Q24H. Goal AUC 400-550. Expected AUC: 451.8 Expected Css min: 11.3 SCr used: 0.8 (actual 0.53)  Weight used: TBW (TBW<IBW), Vd used: 0.72 (BMI 18.7) Check vancomycin trough following today's dose (5th maintenance dose) Continue ceftriaxone 1 g IV Q24H Patient is also on metronidazole 500 mg IV Q12H Continue to monitor renal function and follow culture results   Height: 5\' 7"  (170.2 cm) Weight: 54.4 kg (120 lb) IBW/kg (Calculated) : 61.6  Temp (24hrs), Avg:98.1 F (36.7 C), Min:98 F (36.7 C), Max:98.1 F (36.7 C)  Recent Labs  Lab 04/28/23 1212 04/28/23 1824 04/28/23 2136 04/28/23 2138 04/30/23 0555 05/02/23 0406  WBC 12.0*  --  10.9*  --   --  7.3  CREATININE 0.66  --  0.60  --  0.64 0.53  LATICACIDVEN  --  2.2*  --  0.7  --   --     Estimated Creatinine Clearance: 45.8 mL/min (by C-G formula based on SCr of 0.53 mg/dL).    No Known Allergies  Antimicrobials this admission: 8/15 Vancomycin >>  8/15 Ceftriaxone >>  8/16 Metronidazole >>  Dose adjustments this admission: N/A  Microbiology results: 8/15 BCx: 1 of 4 (anaerobic) Staph hominis  Thank you for allowing pharmacy to be a part of this patient's care.  Celene Squibb, PharmD Clinical Pharmacist 05/02/2023 9:08 AM

## 2023-05-02 NOTE — Plan of Care (Signed)
  Problem: Clinical Measurements: Goal: Ability to maintain clinical measurements within normal limits will improve Outcome: Progressing Goal: Respiratory complications will improve Outcome: Adequate for Discharge Goal: Cardiovascular complication will be avoided Outcome: Adequate for Discharge

## 2023-05-02 NOTE — Plan of Care (Signed)
  Problem: Education: Goal: Knowledge of General Education information will improve Description: Including pain rating scale, medication(s)/side effects and non-pharmacologic comfort measures Outcome: Progressing   Problem: Clinical Measurements: Goal: Ability to maintain clinical measurements within normal limits will improve Outcome: Progressing   Problem: Coping: Goal: Level of anxiety will decrease Outcome: Progressing   Problem: Elimination: Goal: Will not experience complications related to bowel motility Outcome: Progressing   Problem: Safety: Goal: Ability to remain free from injury will improve Outcome: Progressing

## 2023-05-02 NOTE — Progress Notes (Signed)
PROGRESS NOTE    [name]  RUE:[id number] DOB: [birth year] DOA: 04/28/2023 PCP: Almetta Lovely, [facility]  Outpatient Specialists: none    Brief Narrative:   [name] is a 82 y.o. female with medical history significant of Alzheimer's dementia who lives in assisted living facility comes in on account of left foot pain that has been going on for the last 4 days.  According to patient she started noticing redness involving the left foot with associated swelling that have not been improving.  She denied history of diabetes, or any peripheral vascular disease that she knows of.  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 Sepsis by leukocytosis, lactic acidosis. Sepsis physiology resolved, lactate has normalized. - podiatry consulted, plan for amputation after vascular optimization - continue ceftriaxone/vanc/flagyl  # PAD Vascular consulted, needs angiogram but this is on hold pending DSS approval. That's still pending, I've called today and escalated things to the supervisor. This delay will potentially harm the patient. This procedure is clearly in the patient's best interest and benefits outweigh risks.  # Bipolar disorder - cont home olanzapine, sertraline, trazodone, lamotrigine, clonazepam  # Dementia No behavioral disturbance. Resides at [facility]  # Guardianship - DSS guardian determining whether to proceed w/ angiogram.    DVT prophylaxis: lovenox Code Status: dnr Family Communication: none @ bedside  Level of care: Med-Surg Status is: Inpatient Remains inpatient appropriate because: need for IV abx, surgery, etc.    Consultants:  Vascular,  podiatry  Procedures: pending  Antimicrobials:  See above    Subjective: Left foot pain mild, wants to be left alone to take a nap  Objective: Vitals:   05/01/23 0700 05/01/23 1721 05/02/23 0002 05/02/23 0917  BP: (!) 103/56 (!) 104/48 (!) 95/59 (!) 114/50  Pulse: 65 62 61 (!) 59  Resp: 18 16 20 18   Temp: 97.8 F (36.6 C) 98.1 F (36.7 C) 98 F (36.7 C) 98.4 F (36.9 C)  TempSrc:      SpO2: 94% 97% 92% 95%  Weight:      Height:        Intake/Output Summary (Last 24 hours) at 05/02/2023 1519 Last data filed at 05/02/2023 1458 Gross per 24 hour  Intake 0 ml  Output --  Net 0 ml   Filed Weights   04/28/23 1206  Weight: 54.4 kg    Examination:  General exam: Appears calm and comfortable  Respiratory system: Clear to auscultation. Respiratory effort normal. Cardiovascular system: S1 & S2 heard, RRR. No JVD, murmurs, rubs, gallops or clicks. No pedal edema. Gastrointestinal system: Abdomen is nondistended, soft and nontender. No organomegaly or masses felt. Normal bowel sounds heard. Central nervous system: Alert and oriented. No focal neurological deficits. Extremities: Symmetric 5 x 5 power. Skin: erythema left foot to lower calf, dressing on left foot Psychiatry: flat affect   Data Reviewed: I have personally reviewed following labs and imaging studies  CBC: Recent Labs  Lab 04/28/23 1212 04/28/23 2136 05/02/23 0406  WBC 12.0* 10.9* 7.3  NEUTROABS 7.5  --   --   HGB 11.9* 10.1* 10.7*  HCT 38.4 31.8* 33.5*  MCV 94.1 92.4 91.8  PLT 406* 346 408*   Basic Metabolic Panel: Recent Labs  Lab 04/28/23 1212 04/28/23 2136 04/30/23 0555 05/02/23 0406  NA 137  --  138  --   K 4.2  --  4.1  --   CL 102  --  109  --   CO2 23  --  23  --   GLUCOSE 90  --  84  --   BUN 26*  --  18  --   CREATININE 0.66 0.60 0.64 0.53  CALCIUM 9.3  --  8.9  --    GFR: Estimated Creatinine Clearance: 45.8 mL/min (by C-G formula based on SCr of 0.53 mg/dL). Liver Function  Tests: Recent Labs  Lab 04/28/23 1212  AST 19  ALT 16  ALKPHOS 77  BILITOT 0.6  PROT 6.8  ALBUMIN 3.3*   No results for input(s): "LIPASE", "AMYLASE" in the last 168 hours. No results for input(s): "AMMONIA" in the last 168 hours. Coagulation Profile: No results for input(s): "INR", "PROTIME" in the last 168 hours. Cardiac Enzymes: No results for input(s): "CKTOTAL", "CKMB", "CKMBINDEX", "TROPONINI" in the last 168 hours. BNP (last 3 results) No results for input(s): "PROBNP" in the last 8760 hours. HbA1C: No results for input(s): "HGBA1C" in the last 72 hours.  CBG: No results for input(s): "GLUCAP" in the last 168 hours. Lipid Profile: No results for input(s): "CHOL", "HDL", "LDLCALC", "TRIG", "CHOLHDL", "LDLDIRECT" in the last 72 hours. Thyroid Function Tests: No results for input(s): "TSH", "T4TOTAL", "FREET4", "T3FREE", "THYROIDAB" in the last 72 hours. Anemia Panel: No results for input(s): "VITAMINB12", "FOLATE", "FERRITIN", "TIBC", "IRON", "RETICCTPCT" in the last 72 hours. Urine analysis:    Component Value Date/Time   COLORURINE YELLOW (A) 03/28/2021 0956   APPEARANCEUR CLEAR (A) 03/28/2021 0956   APPEARANCEUR Clear 10/22/2012 0404   LABSPEC 1.026 03/28/2021 0956   LABSPEC 1.035 10/22/2012 0404   PHURINE 5.0 03/28/2021 0956   GLUCOSEU 50 (A) 03/28/2021 0956   GLUCOSEU Negative 10/22/2012 0404   HGBUR NEGATIVE 03/28/2021 0956   BILIRUBINUR NEGATIVE 03/28/2021 0956   BILIRUBINUR Negative 10/22/2012 0404   KETONESUR NEGATIVE 03/28/2021 0956   PROTEINUR NEGATIVE 03/28/2021 0956   UROBILINOGEN 0.2 06/14/2011 1805   NITRITE NEGATIVE 03/28/2021 0956   LEUKOCYTESUR TRACE (A) 03/28/2021 0956   LEUKOCYTESUR Negative 10/22/2012 0404   Sepsis Labs: @LABRCNTIP (procalcitonin:4,lacticidven:4)  ) Recent Results (from the past 240 hour(s))  Blood culture (routine x 2)     Status: Abnormal   Collection Time: 04/28/23  6:24 PM   Specimen: BLOOD  Result Value Ref  Range Status   Specimen Description   Final    BLOOD BLOOD RIGHT ARM Performed at [facility], [address], [place], [place] [address]    Special Requests   Final    BOTTLES DRAWN AEROBIC AND ANAEROBIC Blood Culture results may not be optimal due to an inadequate volume of blood received in culture bottles Performed at [facility], [address] [place], [place] [address]    Culture  Setup Time   Final    GRAM POSITIVE COCCI ANAEROBIC BOTTLE ONLY STAPHYLOCOCCUS SPECIES CRITICAL RESULT CALLED TO, READ BACK BY AND VERIFIED WITH: NATHAN BELEU @ 2132 04/29/23 [facility]    Culture (A)  Final    STAPHYLOCOCCUS HOMINIS THE SIGNIFICANCE OF ISOLATING THIS ORGANISM FROM A SINGLE SET OF BLOOD CULTURES WHEN MULTIPLE SETS ARE DRAWN IS UNCERTAIN. PLEASE NOTIFY THE MICROBIOLOGY DEPARTMENT WITHIN ONE WEEK IF SPECIATION AND SENSITIVITIES ARE REQUIRED. Performed at [facility], [address], [place], [place] [address]    Report Status 05/02/2023 FINAL  Final  Blood culture (routine x 2)     Status: None (Preliminary result)   Collection Time: 04/28/23  6:24 PM   Specimen: BLOOD  Result Value Ref Range Status   Specimen Description BLOOD BLOOD RIGHT FOREARM  Final   Special Requests   Final    BOTTLES DRAWN AEROBIC AND ANAEROBIC Blood Culture results may not be optimal due to an inadequate volume of blood received in culture bottles   Culture   Final    NO GROWTH 4 DAYS Performed at [facility], [address] [place], [place] [address]    Report Status PENDING  Incomplete  Blood Culture ID Panel (Reflexed)     Status: Abnormal   Collection Time: 04/28/23  6:24 PM  Result Value Ref Range Status   Enterococcus faecalis NOT DETECTED NOT DETECTED Final   Enterococcus Faecium NOT DETECTED NOT DETECTED Final   Listeria monocytogenes NOT DETECTED NOT DETECTED Final   Staphylococcus species DETECTED (A) NOT DETECTED Final    Comment: CRITICAL RESULT CALLED TO,  READ BACK BY AND VERIFIED WITH: NATHAN BELUE @ 2132 04/29/23 [facility]    Staphylococcus aureus (BCID) NOT DETECTED NOT DETECTED Final   Staphylococcus epidermidis NOT DETECTED NOT DETECTED Final   Staphylococcus lugdunensis NOT DETECTED NOT DETECTED Final   Streptococcus species NOT DETECTED NOT DETECTED Final   Streptococcus agalactiae NOT DETECTED NOT DETECTED Final   Streptococcus pneumoniae NOT DETECTED NOT DETECTED Final   Streptococcus pyogenes NOT DETECTED NOT DETECTED Final   A.calcoaceticus-baumannii NOT DETECTED NOT DETECTED Final   Bacteroides fragilis NOT DETECTED NOT DETECTED Final   Enterobacterales NOT DETECTED NOT DETECTED Final   Enterobacter cloacae complex NOT DETECTED NOT DETECTED Final   Escherichia coli NOT DETECTED NOT DETECTED Final   Klebsiella aerogenes NOT DETECTED NOT DETECTED Final   Klebsiella oxytoca NOT DETECTED NOT DETECTED Final   Klebsiella pneumoniae NOT DETECTED NOT DETECTED Final   Proteus species NOT DETECTED NOT DETECTED Final   Salmonella species NOT DETECTED NOT DETECTED Final   Serratia marcescens NOT DETECTED NOT DETECTED Final   Haemophilus influenzae NOT DETECTED NOT DETECTED Final   Neisseria meningitidis NOT DETECTED NOT DETECTED Final   Pseudomonas aeruginosa NOT DETECTED NOT DETECTED Final   Stenotrophomonas maltophilia NOT DETECTED NOT DETECTED Final   Candida albicans NOT DETECTED NOT DETECTED Final   Candida auris NOT DETECTED NOT DETECTED Final   Candida glabrata NOT DETECTED NOT DETECTED Final   Candida krusei NOT DETECTED NOT DETECTED Final   Candida parapsilosis NOT DETECTED NOT DETECTED Final   Candida tropicalis NOT DETECTED NOT DETECTED Final   Cryptococcus neoformans/gattii NOT DETECTED NOT DETECTED Final    Comment: Performed at [facility], [address] [place], [place] [address]         Radiology Studies: No results found.      Scheduled Meds:  cholecalciferol  1,000 Units Oral Daily   enoxaparin  (LOVENOX) injection  40 mg Subcutaneous Q24H   feeding supplement  237 mL Oral BID BM   lamoTRIgine  25 mg Oral BID   melatonin  5 mg Oral QHS   multivitamin with minerals  1 tablet Oral Daily   OLANZapine  5 mg Oral QHS   polyethylene glycol  17 g Oral Daily   senna-docusate  2 tablet Oral BID   sertraline  100 mg Oral Daily   traZODone  100 mg Oral  QHS   Continuous Infusions:  cefTRIAXone (ROCEPHIN)  IV 1 g (05/01/23 1654)   metronidazole 500 mg (05/02/23 1021)   vancomycin 750 mg (05/01/23 1748)     LOS: 4 days     Silvano Bilis, MD [facility]   If 7PM-7AM, please contact night-coverage [email or web address] Password [facility] 05/02/2023, 3:19 PM

## 2023-05-02 NOTE — Progress Notes (Signed)
Progress Note    05/02/2023 9:53 AM * No surgery found *  Subjective:  [name] is an 82 yo female who presented to [facility] emergency department on 04/28/2023 for cellulitis of her left lower extremity.  On evaluation she had an MRI of her left foot which revealed ulcerations with underlying osteomyelitis of the toes and forefoot without any abscess.  Vascular surgery was consulted for an angiogram prior to proceeding with any kind of surgery/amputation.  On Friday, 04/29/2023 I spoke with Adolphus Birchwood from [facility] DSS who are her caretakers.  We discussed in detail with the patient may need which is an angiogram of her left lower extremity.  She asked for something in writing describing the procedure and the needs for it.  That was sent to her an email earlier this morning.  I am waiting to hear back for consent for procedure that is scheduled for tomorrow Tuesday, 05/03/2023.   Vitals:   05/02/23 0002 05/02/23 0917  BP: (!) 95/59 (!) 114/50  Pulse: 61 (!) 59  Resp: 20 18  Temp: 98 F (36.7 C) 98.4 F (36.9 C)  SpO2: 92% 95%   Physical Exam: Cardiac:  RRR, Normal S1, S2. No JVD, murmurs, rubs, gallops or clicks. No pedal edema.  Lungs: Clear throughout on auscultation.  Normal respiratory effort. Incisions: None Extremities: Left lower extremity with ulcerations to the left foot and forefoot.  Unable to palpate pulses but foot is warm to touch. Abdomen: Positive bowel sounds throughout, soft, nontender and nondistended. Neurologic: History of Alzheimer's dementia with flat affect.  Patient refusing to answer any questions or follow commands this morning.  CBC    Component Value Date/Time   WBC 7.3 05/02/2023 0406   RBC 3.65 (L) 05/02/2023 0406   HGB 10.7 (L) 05/02/2023 0406   HGB 14.5 10/22/2012 0051   HCT 33.5 (L) 05/02/2023 0406   HCT 44.4 10/22/2012 0051   PLT 408 (H) 05/02/2023 0406   PLT 255 10/22/2012 0051   MCV 91.8 05/02/2023 0406   MCV 95 10/22/2012  0051   MCH 29.3 05/02/2023 0406   MCHC 31.9 05/02/2023 0406   RDW 12.7 05/02/2023 0406   RDW 13.3 10/22/2012 0051   LYMPHSABS 2.7 04/28/2023 1212   MONOABS 1.4 (H) 04/28/2023 1212   EOSABS 0.4 04/28/2023 1212   BASOSABS 0.0 04/28/2023 1212    BMET    Component Value Date/Time   NA 138 04/30/2023 0555   NA 143 10/22/2012 0051   K 4.1 04/30/2023 0555   K 3.7 10/22/2012 0051   CL 109 04/30/2023 0555   CL 109 (H) 10/22/2012 0051   CO2 23 04/30/2023 0555   CO2 26 10/22/2012 0051   GLUCOSE 84 04/30/2023 0555   GLUCOSE 129 (H) 10/22/2012 0051   BUN 18 04/30/2023 0555   BUN 33 (H) 10/22/2012 0051   CREATININE 0.53 05/02/2023 0406   CREATININE 0.66 10/22/2012 0051   CALCIUM 8.9 04/30/2023 0555   CALCIUM 9.7 10/22/2012 0051   GFRNONAA >60 05/02/2023 0406   GFRNONAA >60 10/22/2012 0051   GFRAA >60 02/29/2020 1134   GFRAA >60 10/22/2012 0051    INR No results found for: "INR"   Intake/Output Summary (Last 24 hours) at 05/02/2023 0953 Last data filed at 05/01/2023 1920 Gross per 24 hour  Intake 684.22 ml  Output 150 ml  Net 534.22 ml     Assessment/Plan:  82 y.o. female is s/p admission for cellulitis to left lower extremity with ulceration.  Upon workup  vascular surgery was consulted for an angiogram of the left lower extremity to determine further surgical intervention.* No surgery found *   PLAN: I spoke with [facility] DSS representative Kinesha Heyliger last Friday, 04/29/2023.  I sent an email earlier this morning describing and requesting consent for left lower extremity angiogram to further determine patient's possible need for surgery.  I have not received a response at this time.  Patient is scheduled for the procedure tomorrow and we will leave it that way until I hear back with consent.  Patient will be made n.p.o. after midnight for the procedure.  DVT prophylaxis: Lovenox 40 mg subcu daily   Marcie Bal [facility] 05/02/2023 9:53  AM

## 2023-05-02 NOTE — H&P (View-Only) (Signed)
Progress Note    05/02/2023 9:53 AM * No surgery found *  Subjective:  [name] is an 82 yo female who presented to [facility] emergency department on 04/28/2023 for cellulitis of her left lower extremity.  On evaluation she had an MRI of her left foot which revealed ulcerations with underlying osteomyelitis of the toes and forefoot without any abscess.  Vascular surgery was consulted for an angiogram prior to proceeding with any kind of surgery/amputation.  On Friday, 04/29/2023 I spoke with [name] from [facility] DSS who are her caretakers.  We discussed in detail with the patient may need which is an angiogram of her left lower extremity.  She asked for something in writing describing the procedure and the needs for it.  That was sent to her an email earlier this morning.  I am waiting to hear back for consent for procedure that is scheduled for tomorrow Tuesday, 05/03/2023.   Vitals:   05/02/23 0002 05/02/23 0917  BP: (!) 95/59 (!) 114/50  Pulse: 61 (!) 59  Resp: 20 18  Temp: 98 F (36.7 C) 98.4 F (36.9 C)  SpO2: 92% 95%   Physical Exam: Cardiac:  RRR, Normal S1, S2. No JVD, murmurs, rubs, gallops or clicks. No pedal edema.  Lungs: Clear throughout on auscultation.  Normal respiratory effort. Incisions: None Extremities: Left lower extremity with ulcerations to the left foot and forefoot.  Unable to palpate pulses but foot is warm to touch. Abdomen: Positive bowel sounds throughout, soft, nontender and nondistended. Neurologic: History of Alzheimer's dementia with flat affect.  Patient refusing to answer any questions or follow commands this morning.  CBC    Component Value Date/Time   WBC 7.3 05/02/2023 0406   RBC 3.65 (L) 05/02/2023 0406   HGB 10.7 (L) 05/02/2023 0406   HGB 14.5 10/22/2012 0051   HCT 33.5 (L) 05/02/2023 0406   HCT 44.4 10/22/2012 0051   PLT 408 (H) 05/02/2023 0406   PLT 255 10/22/2012 0051   MCV 91.8 05/02/2023 0406   MCV 95 10/22/2012  0051   MCH 29.3 05/02/2023 0406   MCHC 31.9 05/02/2023 0406   RDW 12.7 05/02/2023 0406   RDW 13.3 10/22/2012 0051   LYMPHSABS 2.7 04/28/2023 1212   MONOABS 1.4 (H) 04/28/2023 1212   EOSABS 0.4 04/28/2023 1212   BASOSABS 0.0 04/28/2023 1212    BMET    Component Value Date/Time   NA 138 04/30/2023 0555   NA 143 10/22/2012 0051   K 4.1 04/30/2023 0555   K 3.7 10/22/2012 0051   CL 109 04/30/2023 0555   CL 109 (H) 10/22/2012 0051   CO2 23 04/30/2023 0555   CO2 26 10/22/2012 0051   GLUCOSE 84 04/30/2023 0555   GLUCOSE 129 (H) 10/22/2012 0051   BUN 18 04/30/2023 0555   BUN 33 (H) 10/22/2012 0051   CREATININE 0.53 05/02/2023 0406   CREATININE 0.66 10/22/2012 0051   CALCIUM 8.9 04/30/2023 0555   CALCIUM 9.7 10/22/2012 0051   GFRNONAA >60 05/02/2023 0406   GFRNONAA >60 10/22/2012 0051   GFRAA >60 02/29/2020 1134   GFRAA >60 10/22/2012 0051    INR No results found for: "INR"   Intake/Output Summary (Last 24 hours) at 05/02/2023 0953 Last data filed at 05/01/2023 1920 Gross per 24 hour  Intake 684.22 ml  Output 150 ml  Net 534.22 ml     Assessment/Plan:  82 y.o. female is s/p admission for cellulitis to left lower extremity with ulceration.  Upon workup  vascular surgery was consulted for an angiogram of the left lower extremity to determine further surgical intervention.* No surgery found *   PLAN: I spoke with [facility] DSS representative [name] last Friday, 04/29/2023.  I sent an email earlier this morning describing and requesting consent for left lower extremity angiogram to further determine patient's possible need for surgery.  I have not received a response at this time.  Patient is scheduled for the procedure tomorrow and we will leave it that way until I hear back with consent.  Patient will be made n.p.o. after midnight for the procedure.  DVT prophylaxis: Lovenox 40 mg subcu daily   [name] [facility] 05/02/2023 9:53  AM

## 2023-05-03 DIAGNOSIS — M869 Osteomyelitis, unspecified: Secondary | ICD-10-CM | POA: Diagnosis not present

## 2023-05-03 LAB — CULTURE, BLOOD (ROUTINE X 2): Culture: NO GROWTH

## 2023-05-03 LAB — VANCOMYCIN, RANDOM: Vancomycin Rm: 7 ug/mL

## 2023-05-03 MED ORDER — HYDROMORPHONE HCL 1 MG/ML IJ SOLN: 1.0000 mg | Freq: Once | INTRAMUSCULAR | Status: DC | PRN

## 2023-05-03 MED ORDER — SODIUM CHLORIDE 0.9 % IV SOLN: INTRAVENOUS | Status: DC

## 2023-05-03 MED ORDER — MIDAZOLAM HCL 2 MG/ML PO SYRP: 8.0000 mg | ORAL_SOLUTION | Freq: Once | ORAL | Status: DC | PRN

## 2023-05-03 MED ORDER — FAMOTIDINE 20 MG PO TABS: 40.0000 mg | ORAL_TABLET | Freq: Once | ORAL | Status: DC | PRN

## 2023-05-03 MED ORDER — CEFAZOLIN SODIUM-DEXTROSE 2-4 GM/100ML-% IV SOLN: 2.0000 g | INTRAVENOUS | Status: DC

## 2023-05-03 MED ORDER — DIPHENHYDRAMINE HCL 50 MG/ML IJ SOLN: 50.0000 mg | Freq: Once | INTRAMUSCULAR | Status: DC | PRN

## 2023-05-03 MED ORDER — METHYLPREDNISOLONE SODIUM SUCC 125 MG IJ SOLR: 125.0000 mg | Freq: Once | INTRAMUSCULAR | Status: DC | PRN

## 2023-05-03 MED ORDER — ONDANSETRON HCL 4 MG/2ML IJ SOLN: 4.0000 mg | Freq: Four times a day (QID) | INTRAMUSCULAR | Status: DC | PRN

## 2023-05-03 MED ORDER — FENTANYL CITRATE PF 50 MCG/ML IJ SOSY: 12.5000 ug | PREFILLED_SYRINGE | Freq: Once | INTRAMUSCULAR | Status: DC | PRN

## 2023-05-03 NOTE — Progress Notes (Signed)
PROGRESS NOTE    [name]  OVF:[id number] DOB: 1941/02/06 DOA: 04/28/2023 PCP: Almetta Lovely, [facility]  Outpatient Specialists: none    Brief Narrative:   [name] is a 82 y.o. female with medical history significant of Alzheimer's dementia who lives in assisted living facility comes in on account of left foot pain that has been going on for the last 4 days.  According to patient she started noticing redness involving the left foot with associated swelling that have not been improving.  She denied history of diabetes, or any peripheral vascular disease that she knows of.  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 Sepsis by leukocytosis, lactic acidosis. Sepsis physiology resolved, lactate has normalized. - podiatry consulted, plan for amputation after vascular optimization - continue ceftriaxone/vanc/flagyl  # PAD Vascular consulted, needs angiogram but this was initially on hold pending DSS authorization. After I complained to his DSS caseworker on 8/19 things were escalated to the supervisor and consent to proceed w/ angiogram was given on 8/19. Angiogram with possible intervention now scheduled for tomorrow.  # Bipolar disorder - cont home olanzapine, sertraline, trazodone, lamotrigine, clonazepam  # Dementia No behavioral disturbance. Resides at [facility]  # Guardianship - DSS guardian   DVT prophylaxis: lovenox Code Status: dnr Family Communication: none @ bedside  Level of care: Med-Surg Status is: Inpatient Remains inpatient appropriate because: need for IV abx, surgery, etc.    Consultants:  Vascular,  podiatry  Procedures: pending  Antimicrobials:  See above    Subjective: Left foot pain mild, eating lunch  Objective: Vitals:   05/02/23 0917 05/02/23 1618 05/03/23 0000 05/03/23 0848  BP: (!) 114/50 (!) 103/52 (!) 101/50 (!) 106/54  Pulse: (!) 59 61 (!) 59 (!) 58  Resp: 18 18 16 15   Temp: 98.4 F (36.9 C) 97.9 F (36.6 C) 98.1 F (36.7 C) 98.5 F (36.9 C)  TempSrc:      SpO2: 95% 97% 98% 96%  Weight:      Height:        Intake/Output Summary (Last 24 hours) at 05/03/2023 1403 Last data filed at 05/03/2023 1026 Gross per 24 hour  Intake 480 ml  Output 651 ml  Net -171 ml   Filed Weights   04/28/23 1206  Weight: 54.4 kg    Examination:  General exam: Appears calm and comfortable  Respiratory system: Clear to auscultation. Respiratory effort normal. Cardiovascular system: S1 & S2 heard, RRR. No JVD, murmurs, rubs, gallops or clicks. No pedal edema. Gastrointestinal system: Abdomen is nondistended, soft and nontender. No organomegaly or masses felt. Normal bowel sounds heard. Central nervous system: Alert and oriented. No focal neurological deficits. Extremities: Symmetric 5 x 5 power. Skin: erythema left foot to lower calf, dressing on left foot Psychiatry: flat affect   Data Reviewed: I have personally reviewed following labs and imaging studies  CBC: Recent Labs  Lab 04/28/23 1212 04/28/23 2136 05/02/23 0406  WBC 12.0* 10.9* 7.3  NEUTROABS 7.5  --   --   HGB 11.9* 10.1* 10.7*  HCT 38.4 31.8* 33.5*  MCV 94.1 92.4 91.8  PLT 406* 346 408*   Basic Metabolic Panel:  Recent Labs  Lab 04/28/23 1212 04/28/23 2136 04/30/23 0555 05/02/23 0406  NA 137  --  138  --   K 4.2  --  4.1  --   CL 102  --  109  --   CO2 23  --  23  --   GLUCOSE 90  --  84  --   BUN 26*  --  18  --   CREATININE 0.66 0.60 0.64 0.53  CALCIUM 9.3  --  8.9  --    GFR: Estimated Creatinine Clearance: 45.8 mL/min (by C-G formula based on SCr of 0.53 mg/dL). Liver Function  Tests: Recent Labs  Lab 04/28/23 1212  AST 19  ALT 16  ALKPHOS 77  BILITOT 0.6  PROT 6.8  ALBUMIN 3.3*   No results for input(s): "LIPASE", "AMYLASE" in the last 168 hours. No results for input(s): "AMMONIA" in the last 168 hours. Coagulation Profile: No results for input(s): "INR", "PROTIME" in the last 168 hours. Cardiac Enzymes: No results for input(s): "CKTOTAL", "CKMB", "CKMBINDEX", "TROPONINI" in the last 168 hours. BNP (last 3 results) No results for input(s): "PROBNP" in the last 8760 hours. HbA1C: No results for input(s): "HGBA1C" in the last 72 hours.  CBG: No results for input(s): "GLUCAP" in the last 168 hours. Lipid Profile: No results for input(s): "CHOL", "HDL", "LDLCALC", "TRIG", "CHOLHDL", "LDLDIRECT" in the last 72 hours. Thyroid Function Tests: No results for input(s): "TSH", "T4TOTAL", "FREET4", "T3FREE", "THYROIDAB" in the last 72 hours. Anemia Panel: No results for input(s): "VITAMINB12", "FOLATE", "FERRITIN", "TIBC", "IRON", "RETICCTPCT" in the last 72 hours. Urine analysis:    Component Value Date/Time   COLORURINE YELLOW (A) 03/28/2021 0956   APPEARANCEUR CLEAR (A) 03/28/2021 0956   APPEARANCEUR Clear 10/22/2012 0404   LABSPEC 1.026 03/28/2021 0956   LABSPEC 1.035 10/22/2012 0404   PHURINE 5.0 03/28/2021 0956   GLUCOSEU 50 (A) 03/28/2021 0956   GLUCOSEU Negative 10/22/2012 0404   HGBUR NEGATIVE 03/28/2021 0956   BILIRUBINUR NEGATIVE 03/28/2021 0956   BILIRUBINUR Negative 10/22/2012 0404   KETONESUR NEGATIVE 03/28/2021 0956   PROTEINUR NEGATIVE 03/28/2021 0956   UROBILINOGEN 0.2 06/14/2011 1805   NITRITE NEGATIVE 03/28/2021 0956   LEUKOCYTESUR TRACE (A) 03/28/2021 0956   LEUKOCYTESUR Negative 10/22/2012 0404   Sepsis Labs: @LABRCNTIP (procalcitonin:4,lacticidven:4)  ) Recent Results (from the past 240 hour(s))  Blood culture (routine x 2)     Status: Abnormal   Collection Time: 04/28/23  6:24 PM   Specimen: BLOOD  Result Value Ref  Range Status   Specimen Description   Final    BLOOD BLOOD RIGHT ARM Performed at [facility], [address], [place], [place] [address]    Special Requests   Final    BOTTLES DRAWN AEROBIC AND ANAEROBIC Blood Culture results may not be optimal due to an inadequate volume of blood received in culture bottles Performed at [facility], [address] [place], [place] [address]    Culture  Setup Time   Final    GRAM POSITIVE COCCI ANAEROBIC BOTTLE ONLY STAPHYLOCOCCUS SPECIES CRITICAL RESULT CALLED TO, READ BACK BY AND VERIFIED WITH: NATHAN BELEU @ 2132 04/29/23 [facility]    Culture (A)  Final    STAPHYLOCOCCUS HOMINIS THE SIGNIFICANCE OF ISOLATING THIS ORGANISM FROM A SINGLE SET OF BLOOD CULTURES WHEN MULTIPLE SETS ARE DRAWN IS UNCERTAIN. PLEASE NOTIFY THE MICROBIOLOGY DEPARTMENT WITHIN ONE WEEK IF SPECIATION AND SENSITIVITIES ARE REQUIRED. Performed at [facility], [address], [place], [place] [address]  Report Status 05/02/2023 FINAL  Final  Blood culture (routine x 2)     Status: None   Collection Time: 04/28/23  6:24 PM   Specimen: BLOOD  Result Value Ref Range Status   Specimen Description BLOOD BLOOD RIGHT FOREARM  Final   Special Requests   Final    BOTTLES DRAWN AEROBIC AND ANAEROBIC Blood Culture results may not be optimal due to an inadequate volume of blood received in culture bottles   Culture   Final    NO GROWTH 5 DAYS Performed at [facility], [address] [place], [place] [address]    Report Status 05/03/2023 FINAL  Final  Blood Culture ID Panel (Reflexed)     Status: Abnormal   Collection Time: 04/28/23  6:24 PM  Result Value Ref Range Status   Enterococcus faecalis NOT DETECTED NOT DETECTED Final   Enterococcus Faecium NOT DETECTED NOT DETECTED Final   Listeria monocytogenes NOT DETECTED NOT DETECTED Final   Staphylococcus species DETECTED (A) NOT DETECTED Final    Comment: CRITICAL RESULT CALLED TO, READ BACK BY AND  VERIFIED WITH: NATHAN BELUE @ 2132 04/29/23 [facility]    Staphylococcus aureus (BCID) NOT DETECTED NOT DETECTED Final   Staphylococcus epidermidis NOT DETECTED NOT DETECTED Final   Staphylococcus lugdunensis NOT DETECTED NOT DETECTED Final   Streptococcus species NOT DETECTED NOT DETECTED Final   Streptococcus agalactiae NOT DETECTED NOT DETECTED Final   Streptococcus pneumoniae NOT DETECTED NOT DETECTED Final   Streptococcus pyogenes NOT DETECTED NOT DETECTED Final   A.calcoaceticus-baumannii NOT DETECTED NOT DETECTED Final   Bacteroides fragilis NOT DETECTED NOT DETECTED Final   Enterobacterales NOT DETECTED NOT DETECTED Final   Enterobacter cloacae complex NOT DETECTED NOT DETECTED Final   Escherichia coli NOT DETECTED NOT DETECTED Final   Klebsiella aerogenes NOT DETECTED NOT DETECTED Final   Klebsiella oxytoca NOT DETECTED NOT DETECTED Final   Klebsiella pneumoniae NOT DETECTED NOT DETECTED Final   Proteus species NOT DETECTED NOT DETECTED Final   Salmonella species NOT DETECTED NOT DETECTED Final   Serratia marcescens NOT DETECTED NOT DETECTED Final   Haemophilus influenzae NOT DETECTED NOT DETECTED Final   Neisseria meningitidis NOT DETECTED NOT DETECTED Final   Pseudomonas aeruginosa NOT DETECTED NOT DETECTED Final   Stenotrophomonas maltophilia NOT DETECTED NOT DETECTED Final   Candida albicans NOT DETECTED NOT DETECTED Final   Candida auris NOT DETECTED NOT DETECTED Final   Candida glabrata NOT DETECTED NOT DETECTED Final   Candida krusei NOT DETECTED NOT DETECTED Final   Candida parapsilosis NOT DETECTED NOT DETECTED Final   Candida tropicalis NOT DETECTED NOT DETECTED Final   Cryptococcus neoformans/gattii NOT DETECTED NOT DETECTED Final    Comment: Performed at [facility], [address], [place], [place] [address]         Radiology Studies: No results found.      Scheduled Meds:  cholecalciferol  1,000 Units Oral Daily   enoxaparin (LOVENOX)  injection  40 mg Subcutaneous Q24H   feeding supplement  237 mL Oral BID BM   lamoTRIgine  25 mg Oral BID   melatonin  5 mg Oral QHS   multivitamin with minerals  1 tablet Oral Daily   OLANZapine  5 mg Oral QHS   polyethylene glycol  34 g Oral Daily   senna-docusate  2 tablet Oral BID   sertraline  100 mg Oral Daily   traZODone  100 mg Oral QHS   Continuous Infusions:  cefTRIAXone (ROCEPHIN)  IV Stopped (05/02/23  1858)   metronidazole 500 mg (05/03/23 1026)   vancomycin 750 mg (05/02/23 1859)     LOS: 5 days     Silvano Bilis, MD [facility]   If 7PM-7AM, please contact night-coverage [email or web address] Password [email or web address] 05/03/2023, 2:03 PM

## 2023-05-03 NOTE — Plan of Care (Signed)

## 2023-05-04 ENCOUNTER — Encounter
Admission: EM | Disposition: A | Discharge status: Skilled Nursing Facility | Payer: Self-pay | Source: Skilled Nursing Facility | Attending: Internal Medicine

## 2023-05-04 DIAGNOSIS — L97529 Non-pressure chronic ulcer of other part of left foot with unspecified severity: Secondary | ICD-10-CM

## 2023-05-04 DIAGNOSIS — I700 Atherosclerosis of aorta: Secondary | ICD-10-CM

## 2023-05-04 DIAGNOSIS — I70201 Unspecified atherosclerosis of native arteries of extremities, right leg: Secondary | ICD-10-CM

## 2023-05-04 DIAGNOSIS — I70245 Atherosclerosis of native arteries of left leg with ulceration of other part of foot: Secondary | ICD-10-CM | POA: Diagnosis not present

## 2023-05-04 DIAGNOSIS — M869 Osteomyelitis, unspecified: Secondary | ICD-10-CM | POA: Diagnosis not present

## 2023-05-04 HISTORY — PX: LOWER EXTREMITY INTERVENTION: CATH118252

## 2023-05-04 SURGERY — LOWER EXTREMITY INTERVENTION
Anesthesia: Moderate Sedation | Laterality: Left

## 2023-05-04 MED ORDER — SODIUM CHLORIDE 0.9 % IV SOLN: INTRAVENOUS | Status: DC

## 2023-05-04 MED ORDER — IODIXANOL 320 MG/ML IV SOLN
INTRAVENOUS | Status: DC | PRN
  Administered 2023-05-04: 105 mL via INTRA_ARTERIAL

## 2023-05-04 MED ORDER — CEFAZOLIN SODIUM-DEXTROSE 2-4 GM/100ML-% IV SOLN
INTRAVENOUS | Status: AC
  Filled 2023-05-04: qty 100

## 2023-05-04 MED ORDER — LIDOCAINE HCL (PF) 1 % IJ SOLN
INTRAMUSCULAR | Status: DC | PRN
  Administered 2023-05-04: 10 mL

## 2023-05-04 MED ORDER — CLOPIDOGREL BISULFATE 75 MG PO TABS
75.0000 mg | ORAL_TABLET | Freq: Every day | ORAL | Status: DC
  Administered 2023-05-04 – 2023-05-10 (×4): 75 mg via ORAL
  Filled 2023-05-04 (×5): qty 1

## 2023-05-04 MED ORDER — METHYLPREDNISOLONE SODIUM SUCC 125 MG IJ SOLR: 125.0000 mg | Freq: Once | INTRAMUSCULAR | Status: DC | PRN

## 2023-05-04 MED ORDER — SODIUM CHLORIDE 0.9 % IV SOLN: INTRAVENOUS | Status: AC

## 2023-05-04 MED ORDER — HYDRALAZINE HCL 20 MG/ML IJ SOLN: 5.0000 mg | INTRAMUSCULAR | Status: DC | PRN

## 2023-05-04 MED ORDER — HEPARIN SODIUM (PORCINE) 1000 UNIT/ML IJ SOLN
INTRAMUSCULAR | Status: AC
  Filled 2023-05-04: qty 10

## 2023-05-04 MED ORDER — MIDAZOLAM HCL 2 MG/2ML IJ SOLN
INTRAMUSCULAR | Status: AC
  Filled 2023-05-04: qty 2

## 2023-05-04 MED ORDER — FENTANYL CITRATE PF 50 MCG/ML IJ SOSY
PREFILLED_SYRINGE | INTRAMUSCULAR | Status: AC
  Filled 2023-05-04: qty 1

## 2023-05-04 MED ORDER — HEPARIN SODIUM (PORCINE) 1000 UNIT/ML IJ SOLN
INTRAMUSCULAR | Status: DC | PRN
  Administered 2023-05-04: 6000 [IU] via INTRAVENOUS

## 2023-05-04 MED ORDER — MORPHINE SULFATE (PF) 2 MG/ML IV SOLN: 2.0000 mg | INTRAVENOUS | Status: DC | PRN

## 2023-05-04 MED ORDER — DIPHENHYDRAMINE HCL 50 MG/ML IJ SOLN: 50.0000 mg | Freq: Once | INTRAMUSCULAR | Status: DC | PRN

## 2023-05-04 MED ORDER — HYDROMORPHONE HCL 1 MG/ML IJ SOLN: 1.0000 mg | Freq: Once | INTRAMUSCULAR | Status: DC | PRN

## 2023-05-04 MED ORDER — CEFAZOLIN SODIUM-DEXTROSE 2-4 GM/100ML-% IV SOLN
2.0000 g | INTRAVENOUS | Status: AC
  Administered 2023-05-04: 2 g via INTRAVENOUS

## 2023-05-04 MED ORDER — SODIUM CHLORIDE 0.9 % IV SOLN: 250.0000 mL | INTRAVENOUS | Status: DC | PRN

## 2023-05-04 MED ORDER — SODIUM CHLORIDE 0.9% FLUSH: 3.0000 mL | INTRAVENOUS | Status: DC | PRN

## 2023-05-04 MED ORDER — MIDAZOLAM HCL 2 MG/ML PO SYRP: 8.0000 mg | ORAL_SOLUTION | Freq: Once | ORAL | Status: DC | PRN

## 2023-05-04 MED ORDER — SODIUM CHLORIDE 0.9% FLUSH
3.0000 mL | Freq: Two times a day (BID) | INTRAVENOUS | Status: DC
  Administered 2023-05-04 – 2023-05-10 (×5): 3 mL via INTRAVENOUS

## 2023-05-04 MED ORDER — FENTANYL CITRATE (PF) 100 MCG/2ML IJ SOLN
INTRAMUSCULAR | Status: DC | PRN
  Administered 2023-05-04 (×2): 25 ug via INTRAVENOUS
  Administered 2023-05-04: 50 ug via INTRAVENOUS

## 2023-05-04 MED ORDER — FAMOTIDINE 20 MG PO TABS: 40.0000 mg | ORAL_TABLET | Freq: Once | ORAL | Status: DC | PRN

## 2023-05-04 MED ORDER — FENTANYL CITRATE PF 50 MCG/ML IJ SOSY: 12.5000 ug | PREFILLED_SYRINGE | Freq: Once | INTRAMUSCULAR | Status: DC | PRN

## 2023-05-04 MED ORDER — OXYCODONE HCL 5 MG PO TABS
5.0000 mg | ORAL_TABLET | ORAL | Status: DC | PRN
  Administered 2023-05-04 – 2023-05-09 (×2): 5 mg via ORAL
  Filled 2023-05-04 (×3): qty 1

## 2023-05-04 MED ORDER — VANCOMYCIN HCL IN DEXTROSE 1-5 GM/200ML-% IV SOLN
1000.0000 mg | INTRAVENOUS | Status: DC
  Administered 2023-05-05 – 2023-05-06 (×2): 1000 mg via INTRAVENOUS
  Filled 2023-05-04 (×4): qty 200

## 2023-05-04 MED ORDER — ASPIRIN 81 MG PO TBEC
81.0000 mg | DELAYED_RELEASE_TABLET | Freq: Every day | ORAL | Status: DC
  Administered 2023-05-04 – 2023-05-10 (×3): 81 mg via ORAL
  Filled 2023-05-04 (×4): qty 1

## 2023-05-04 MED ORDER — ATORVASTATIN CALCIUM 10 MG PO TABS
10.0000 mg | ORAL_TABLET | Freq: Every day | ORAL | Status: DC
  Administered 2023-05-05 – 2023-05-10 (×2): 10 mg via ORAL
  Filled 2023-05-04 (×3): qty 1

## 2023-05-04 MED ORDER — MIDAZOLAM HCL 2 MG/2ML IJ SOLN
INTRAMUSCULAR | Status: DC | PRN
  Administered 2023-05-04: .5 mg via INTRAVENOUS
  Administered 2023-05-04: 2 mg via INTRAVENOUS
  Administered 2023-05-04: 1 mg via INTRAVENOUS

## 2023-05-04 MED ORDER — HEPARIN (PORCINE) IN NACL 2000-0.9 UNIT/L-% IV SOLN
INTRAVENOUS | Status: DC | PRN
  Administered 2023-05-04: 1000 mL

## 2023-05-04 MED ORDER — ONDANSETRON HCL 4 MG/2ML IJ SOLN: 4.0000 mg | Freq: Four times a day (QID) | INTRAMUSCULAR | Status: DC | PRN

## 2023-05-04 MED ORDER — LABETALOL HCL 5 MG/ML IV SOLN: 10.0000 mg | INTRAVENOUS | Status: DC | PRN

## 2023-05-04 SURGICAL SUPPLY — 30 items
BALLN LUTONIX 018 4X60X130 (BALLOONS) ×1
BALLN LUTONIX 018 5X150X130 (BALLOONS) ×1
BALLN LUTONIX 018 5X300X130 (BALLOONS) ×1
BALLN ULTRVRSE 5X10XX130C (BALLOONS) ×1
BALLOON LUTONIX 018 4X60X130 (BALLOONS) IMPLANT
BALLOON LUTONIX 018 5X150X130 (BALLOONS) IMPLANT
BALLOON LUTONIX 018 5X300X130 (BALLOONS) IMPLANT
BALLOON ULTRVRSE 5X10XX130C (BALLOONS) IMPLANT
CATH ANGIO 5F PIGTAIL 65CM (CATHETERS) IMPLANT
CATH BERNSTEIN 5FR 130CM (CATHETERS) IMPLANT
CATH KUMPE SOFT-VU 5FR 65 (CATHETERS) IMPLANT
CATH ROTAREX 135 6FR (CATHETERS) IMPLANT
COVER PROBE U/S 5X48 (MISCELLANEOUS) IMPLANT
DEVICE STARCLOSE SE CLOSURE (Vascular Products) IMPLANT
GLIDEWIRE ADV .035X260CM (WIRE) IMPLANT
KIT ENCORE 26 ADVANTAGE (KITS) IMPLANT
NDL ENTRY 21GA 7CM ECHOTIP (NEEDLE) IMPLANT
NEEDLE ENTRY 21GA 7CM ECHOTIP (NEEDLE) ×1 IMPLANT
SET INTRO CAPELLA COAXIAL (SET/KITS/TRAYS/PACK) IMPLANT
SHEATH ANL2 6FRX45 HC (SHEATH) IMPLANT
SHEATH BRITE TIP 5FRX11 (SHEATH) IMPLANT
STENT LIFESTENT 5F 6X150X135 (Permanent Stent) IMPLANT
STENT LIFESTENT 5F 6X60X135 (Permanent Stent) IMPLANT
STENT LIFESTENT 6X170X130 (Permanent Stent) IMPLANT
STENT VIABAHN 6X100X120 (Permanent Stent) IMPLANT
SUT MNCRL AB 4-0 PS2 18 (SUTURE) IMPLANT
SYR MEDRAD MARK 7 150ML (SYRINGE) IMPLANT
TUBING CONTRAST HIGH PRESS 72 (TUBING) IMPLANT
WIRE G V18X300CM (WIRE) IMPLANT
WIRE GUIDERIGHT .035X150 (WIRE) IMPLANT

## 2023-05-04 NOTE — Op Note (Signed)
[facility]  Percutaneous Study/Intervention Procedural Note   Date of Surgery: 05/04/2023  Surgeon:  Renford Dills, MD.  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  Introduction catheter into left lower extremity 3rd order catheter placement              2.    Contrast injection left lower extremity for distal runoff             3.  Percutaneous transluminal angioplasty and stent placement left superficial femoral artery and popliteal artery.             4.  Rota Rex thrombectomy left SFA.                Anesthesia: Conscious sedation was administered under my direct supervision by the interventional radiology RN. IV Versed plus fentanyl were utilized. Continuous ECG, pulse oximetry and blood pressure was monitored throughout the entire procedure.  Conscious sedation was for a total of 67 minutes.  Sheath: 6 [place] Ansell right common femoral retrograde  Contrast: 105 cc  Fluoroscopy Time: 17.7 minutes  Indications:  Loann Quill presents with ulcerations of the left foot associated with infection.  MRI is showing osteomyelitis.  This places her at increased risk for limb loss.  Risks and benefits as well as alternative therapies for angiography ever been reviewed with her guardian.  All questions been answered and we have been given permission to move forward with revascularization for limb salvage.  The risks and benefits are reviewed all questions answered patient agrees to proceed.  Procedure:  EZOLA GAJDA is a 82 y.o. y.o. female who was identified and appropriate procedural time out was performed.  The patient was then placed supine on the table and prepped and draped in the usual sterile fashion.    Ultrasound was placed in the sterile sleeve and the right groin was  evaluated the right common femoral artery was echolucent and pulsatile indicating patency.  Image was recorded for the permanent record and under real-time visualization a microneedle was inserted into the common femoral artery microwire followed by a micro-sheath.  A J-wire was then advanced through the micro-sheath and a  5 [place] sheath was then inserted over a J-wire. J-wire was then advanced and a 5 French pigtail catheter was positioned at the level of T12. AP projection of the aorta was then obtained. Pigtail catheter was repositioned to above the bifurcation and a RAO view of the pelvis was obtained.  Subsequently a pigtail catheter with the stiff angle Glidewire was used to cross the aortic bifurcation the catheter wire were advanced down into the left distal external iliac artery. Oblique view of the femoral bifurcation was then obtained and subsequently the wire was reintroduced and the pigtail catheter negotiated into the SFA representing third order catheter placement. Distal runoff was then performed.  6000 units of heparin was then given and allowed to circulate and a 6 [place] Ansell sheath was advanced up and over the bifurcation and positioned in the femoral artery  KMP  catheter and stiff angle Glidewire were then negotiated down into the distal popliteal.  Contrast by hand injection through the catheter demonstrated intraluminal positioning within the mid popliteal.  A V18 wire was then reintroduced through the Kumpe catheter and a the [place] Rex  catheter was prepped on the field and then advanced over the wire.  A pass was made through the SFA however this did recanalize the SFA but extravasation was noted proximally.  At this point I placed a 6 mm x 175 mm Lifestream stent from the above-knee popliteal proximally a second 6 mm x 150 mm life stent was then deployed extending more proximally.  Lastly a 6 mm x 100 mm Viabahn stent was deployed so that its leading edge was right at the origin of  the SFA.  A 5 mm balloon angioplasty was then performed using Lutonix distally and an Ultraverse in the Viabahn stent.  Follow-up imaging demonstrated extravasation at the distal edge of the Viabahn stent and a second 6 mm x 50 mm Viabahn stent was added and postdilated with a 5 mm balloon.  Follow-up imaging demonstrated wide patency of the SFA with less than 10% residual stenosis.  However the popliteal artery at the level of the femoral condyles demonstrated greater than 60% stenosis in 2 locations and a 6 mm x 60 mm life stent was deployed and then postdilated using a 4 mm x 60 mm Lutonix drug-eluting balloon inflated to 10 atm for approximately 1 minute.  Follow-up imaging now demonstrated wide patency of the entire SFA and popliteal with three-vessel runoff to the foot.   After review of these images the sheath is pulled into the right external iliac oblique of the common femoral is obtained profound iliac and common femoral disease is noted and therefore the sheath is pulled and pressure is held. There no immediate complications.   Findings:  The abdominal aorta is opacified with a bolus injection contrast. The aorta itself has diffuse disease but no hemodynamically significant lesions. The common and external iliac arteries are widely patent bilaterally.  The left common femoral is widely patent as is the profunda femoris.  The SFA occludes essentially at its origin and there is reconstitution of the above-knee popliteal just distal to Hunter's canal.  The midportion of the popliteal also demonstrates several greater than 60% stenoses.  The distal one third of the popliteal is patent and free of hemodynamically significant stenoses.  There is three-vessel runoff to the foot.    Following [place] Rex thrombectomy there is reach recanalization however extravasation is noted proximally necessitating a Viabahn stent at this level.  Following angioplasty and stent placement through the SFA and popliteal,  the SFA and popliteal is now patent with in-line flow and there is less than 10% residual stenosis.     Summary: Successful recanalization left lower extremity for limb salvage                        Disposition: Patient was taken to the recovery room in stable condition having tolerated the procedure well.  Treyvone Chelf, Latina Craver 05/04/2023,5:20 PM

## 2023-05-04 NOTE — Consult Note (Signed)
Pharmacy Antibiotic Note  [name] is a 82 y.o. female admitted on 04/28/2023 with cellulitis. PMH significant for bipolar disorder, dementia, PAD, HLD, GERD. MRI of foot revealed ulceration with underlying osteomyelitis of the toes and forefoot cellulitis without abscess. Amputation pending evaluation by vascular and podiatry. Pharmacy has been consulted for vancomycin dosing.  Plan: Day 5 of antibiotics Continue vancomycin 750 mg IV Q24H. Goal AUC 400-550. Expected AUC: 451.8 Expected Css min: 11.3 SCr used: 0.8 (actual 0.53)  Weight used: TBW (TBW<IBW), Vd used: 0.72 (BMI 18.7) Check vancomycin trough following today's dose (5th maintenance dose) Continue ceftriaxone 1 g IV Q24H Patient is also on metronidazole 500 mg IV Q12H Continue to monitor renal function and follow culture results  08/20  Vanc Tr = 7 Increase Vancomycin to 1000 mg IV q24h. Will plan to recheck Vanc Tr in 48hrs after 1st dose  Height: 5\' 7"  (170.2 cm) Weight: 54.4 kg (120 lb) IBW/kg (Calculated) : 61.6  Temp (24hrs), Avg:98.1 F (36.7 C), Min:97.9 F (36.6 C), Max:98.5 F (36.9 C)  Recent Labs  Lab 04/28/23 1212 04/28/23 1824 04/28/23 2136 04/28/23 2138 04/30/23 0555 05/02/23 0406 05/03/23 1347  WBC 12.0*  --  10.9*  --   --  7.3  --   CREATININE 0.66  --  0.60  --  0.64 0.53  --   LATICACIDVEN  --  2.2*  --  0.7  --   --   --   VANCORANDOM  --   --   --   --   --   --  7    Estimated Creatinine Clearance: 45.8 mL/min (by C-G formula based on SCr of 0.53 mg/dL).    No Known Allergies  Antimicrobials this admission: 8/15 Vancomycin >>  8/15 Ceftriaxone >>  8/16 Metronidazole >>  Dose adjustments this admission: N/A  Microbiology results: 8/15 BCx: 1 of 4 (anaerobic) Staph hominis  Thank you for allowing pharmacy to be a part of this patient's care.  Otelia Sergeant, PharmD, [facility] 05/04/2023 1:09 AM

## 2023-05-04 NOTE — Interval H&P Note (Signed)
History and Physical Interval Note:  05/04/2023 11:24 AM  [name]  has presented today for surgery, with the diagnosis of PAD.  The various methods of treatment have been discussed with the patient and family. After consideration of risks, benefits and other options for treatment, the patient has consented to  Procedure(s): LOWER EXTREMITY INTERVENTION (Left) as a surgical intervention.  The patient's history has been reviewed, patient examined, no change in status, stable for surgery.  I have reviewed the patient's chart and labs.  Questions were answered to the patient's satisfaction.     Levora Dredge

## 2023-05-04 NOTE — Progress Notes (Signed)
PROGRESS NOTE    [name]  ZOX:[id number] DOB: October 02, 1940 DOA: 04/28/2023 PCP: Almetta Lovely, [facility]  Outpatient Specialists: none    Brief Narrative:   [name] is a 82 y.o. female with medical history significant of Alzheimer's dementia who lives in assisted living facility comes in on account of left foot pain that has been going on for the last 4 days.  According to patient she started noticing redness involving the left foot with associated swelling that have not been improving.  She denied history of diabetes, or any peripheral vascular disease that she knows of.  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 Lactate has normalized. - podiatry consulted, plan for amputation - vascular surgery following - angiogram today - results pending - continue ceftriaxone/vanc/flagyl  # PAD Vascular consulted, needs angiogram but this was initially on hold pending DSS authorization. After I complained to his DSS caseworker on 8/19 things were escalated to the supervisor and consent to proceed w/ angiogram was given on 8/19. Angiogram with possible intervention now scheduled for tomorrow.  # Bipolar disorder - cont home olanzapine, sertraline, trazodone, lamotrigine, clonazepam  # Dementia No behavioral disturbance. Resides at [facility]  # Guardianship - DSS guardian   DVT prophylaxis: lovenox Code Status: dnr Family Communication: none @ bedside  Level of care: Med-Surg Status is: Inpatient Remains inpatient appropriate because: need for IV abx, surgery,  etc.    Consultants:  Vascular, podiatry  Procedures: Angiogram 05/04/23 - results pending   Antimicrobials:  See above    Subjective: Pt reports left foot pain not very much improvement after oxycodone given about hour and a half ago.  Otherwise she denies complaints.  States plan for toe amputation.  Objective: Vitals:   05/04/23 1345 05/04/23 1400 05/04/23 1430 05/04/23 1644  BP: (!) 118/57 (!) 145/59 128/62 (!) 129/56  Pulse: 61 62 63 65  Resp: 16 18 16 18   Temp:   97.8 F (36.6 C) (!) 97.5 F (36.4 C)  TempSrc:   Oral   SpO2: 99% 98% 100% 100%  Weight:      Height:        Intake/Output Summary (Last 24 hours) at 05/04/2023 1654 Last data filed at 05/04/2023 0950 Gross per 24 hour  Intake 723.64 ml  Output 1000 ml  Net -276.36 ml   Filed Weights   04/28/23 1206  Weight: 54.4 kg    Examination:  General exam: awake, alert, no acute distress HEENT: moist mucus membranes, hearing grossly normal  Respiratory system: CTAB diminished, no wheezes, rales or rhonchi, normal respiratory effort. Cardiovascular system: normal S1/S2, RRR, no pedal edema.   Gastrointestinal system: soft, NT, ND Central nervous system: A&O x3. no gross focal neurologic deficits, normal speech Extremities: left foot dressing in place, no edema, normal tone Skin: dry, intact, normal temperature Psychiatry: normal mood, congruent affect    Data Reviewed: I have personally reviewed following labs and imaging studies  CBC: Recent Labs  Lab 04/28/23 1212 04/28/23 2136 05/02/23 0406  WBC 12.0* 10.9* 7.3  NEUTROABS 7.5  --   --  HGB 11.9* 10.1* 10.7*  HCT 38.4 31.8* 33.5*  MCV 94.1 92.4 91.8  PLT 406* 346 408*   Basic Metabolic Panel: Recent Labs  Lab 04/28/23 1212 04/28/23 2136 04/30/23 0555 05/02/23 0406  NA 137  --  138  --   K 4.2  --  4.1  --   CL 102  --  109  --   CO2 23  --  23  --   GLUCOSE 90  --  84  --   BUN 26*  --  18  --   CREATININE 0.66 0.60 0.64 0.53   CALCIUM 9.3  --  8.9  --    GFR: Estimated Creatinine Clearance: 45.8 mL/min (by C-G formula based on SCr of 0.53 mg/dL). Liver Function Tests: Recent Labs  Lab 04/28/23 1212  AST 19  ALT 16  ALKPHOS 77  BILITOT 0.6  PROT 6.8  ALBUMIN 3.3*   No results for input(s): "LIPASE", "AMYLASE" in the last 168 hours. No results for input(s): "AMMONIA" in the last 168 hours. Coagulation Profile: No results for input(s): "INR", "PROTIME" in the last 168 hours. Cardiac Enzymes: No results for input(s): "CKTOTAL", "CKMB", "CKMBINDEX", "TROPONINI" in the last 168 hours. BNP (last 3 results) No results for input(s): "PROBNP" in the last 8760 hours. HbA1C: No results for input(s): "HGBA1C" in the last 72 hours.  CBG: No results for input(s): "GLUCAP" in the last 168 hours. Lipid Profile: No results for input(s): "CHOL", "HDL", "LDLCALC", "TRIG", "CHOLHDL", "LDLDIRECT" in the last 72 hours. Thyroid Function Tests: No results for input(s): "TSH", "T4TOTAL", "FREET4", "T3FREE", "THYROIDAB" in the last 72 hours. Anemia Panel: No results for input(s): "VITAMINB12", "FOLATE", "FERRITIN", "TIBC", "IRON", "RETICCTPCT" in the last 72 hours. Urine analysis:    Component Value Date/Time   COLORURINE YELLOW (A) 03/28/2021 0956   APPEARANCEUR CLEAR (A) 03/28/2021 0956   APPEARANCEUR Clear 10/22/2012 0404   LABSPEC 1.026 03/28/2021 0956   LABSPEC 1.035 10/22/2012 0404   PHURINE 5.0 03/28/2021 0956   GLUCOSEU 50 (A) 03/28/2021 0956   GLUCOSEU Negative 10/22/2012 0404   HGBUR NEGATIVE 03/28/2021 0956   BILIRUBINUR NEGATIVE 03/28/2021 0956   BILIRUBINUR Negative 10/22/2012 0404   KETONESUR NEGATIVE 03/28/2021 0956   PROTEINUR NEGATIVE 03/28/2021 0956   UROBILINOGEN 0.2 06/14/2011 1805   NITRITE NEGATIVE 03/28/2021 0956   LEUKOCYTESUR TRACE (A) 03/28/2021 0956   LEUKOCYTESUR Negative 10/22/2012 0404   Sepsis Labs: @LABRCNTIP (procalcitonin:4,lacticidven:4)  ) Recent Results (from the past  240 hour(s))  Blood culture (routine x 2)     Status: Abnormal   Collection Time: 04/28/23  6:24 PM   Specimen: BLOOD  Result Value Ref Range Status   Specimen Description   Final    BLOOD BLOOD RIGHT ARM Performed at [facility], [address], [place], [place] [address]    Special Requests   Final    BOTTLES DRAWN AEROBIC AND ANAEROBIC Blood Culture results may not be optimal due to an inadequate volume of blood received in culture bottles Performed at [facility], [address] [place], [place] [address]    Culture  Setup Time   Final    GRAM POSITIVE COCCI ANAEROBIC BOTTLE ONLY STAPHYLOCOCCUS SPECIES CRITICAL RESULT CALLED TO, READ BACK BY AND VERIFIED WITH: NATHAN BELEU @ 2132 04/29/23 [facility]    Culture (A)  Final    STAPHYLOCOCCUS HOMINIS THE SIGNIFICANCE OF ISOLATING THIS ORGANISM FROM A SINGLE SET OF BLOOD CULTURES WHEN MULTIPLE SETS ARE DRAWN IS UNCERTAIN. PLEASE NOTIFY THE MICROBIOLOGY  DEPARTMENT WITHIN ONE WEEK IF SPECIATION AND SENSITIVITIES ARE REQUIRED. Performed at [facility], [address] [place], [place] [address]    Report Status 05/02/2023 FINAL  Final  Blood culture (routine x 2)     Status: None   Collection Time: 04/28/23  6:24 PM   Specimen: BLOOD  Result Value Ref Range Status   Specimen Description BLOOD BLOOD RIGHT FOREARM  Final   Special Requests   Final    BOTTLES DRAWN AEROBIC AND ANAEROBIC Blood Culture results may not be optimal due to an inadequate volume of blood received in culture bottles   Culture   Final    NO GROWTH 5 DAYS Performed at [facility], [address] [place], [place] [address]    Report Status 05/03/2023 FINAL  Final  Blood Culture ID Panel (Reflexed)     Status: Abnormal   Collection Time: 04/28/23  6:24 PM  Result Value Ref Range Status   Enterococcus faecalis NOT DETECTED NOT DETECTED Final   Enterococcus Faecium NOT DETECTED NOT DETECTED Final   Listeria monocytogenes NOT  DETECTED NOT DETECTED Final   Staphylococcus species DETECTED (A) NOT DETECTED Final    Comment: CRITICAL RESULT CALLED TO, READ BACK BY AND VERIFIED WITH: NATHAN BELUE @ 2132 04/29/23 [facility]    Staphylococcus aureus (BCID) NOT DETECTED NOT DETECTED Final   Staphylococcus epidermidis NOT DETECTED NOT DETECTED Final   Staphylococcus lugdunensis NOT DETECTED NOT DETECTED Final   Streptococcus species NOT DETECTED NOT DETECTED Final   Streptococcus agalactiae NOT DETECTED NOT DETECTED Final   Streptococcus pneumoniae NOT DETECTED NOT DETECTED Final   Streptococcus pyogenes NOT DETECTED NOT DETECTED Final   A.calcoaceticus-baumannii NOT DETECTED NOT DETECTED Final   Bacteroides fragilis NOT DETECTED NOT DETECTED Final   Enterobacterales NOT DETECTED NOT DETECTED Final   Enterobacter cloacae complex NOT DETECTED NOT DETECTED Final   Escherichia coli NOT DETECTED NOT DETECTED Final   Klebsiella aerogenes NOT DETECTED NOT DETECTED Final   Klebsiella oxytoca NOT DETECTED NOT DETECTED Final   Klebsiella pneumoniae NOT DETECTED NOT DETECTED Final   Proteus species NOT DETECTED NOT DETECTED Final   Salmonella species NOT DETECTED NOT DETECTED Final   Serratia marcescens NOT DETECTED NOT DETECTED Final   Haemophilus influenzae NOT DETECTED NOT DETECTED Final   Neisseria meningitidis NOT DETECTED NOT DETECTED Final   Pseudomonas aeruginosa NOT DETECTED NOT DETECTED Final   Stenotrophomonas maltophilia NOT DETECTED NOT DETECTED Final   Candida albicans NOT DETECTED NOT DETECTED Final   Candida auris NOT DETECTED NOT DETECTED Final   Candida glabrata NOT DETECTED NOT DETECTED Final   Candida krusei NOT DETECTED NOT DETECTED Final   Candida parapsilosis NOT DETECTED NOT DETECTED Final   Candida tropicalis NOT DETECTED NOT DETECTED Final   Cryptococcus neoformans/gattii NOT DETECTED NOT DETECTED Final    Comment: Performed at [facility], [address] [place], [place] [address]          Radiology Studies: PERIPHERAL VASCULAR CATHETERIZATION  Result Date: 05/04/2023 See surgical note for result.       Scheduled Meds:  aspirin EC  81 mg Oral Daily   [START ON 05/05/2023] atorvastatin  10 mg Oral Daily   cholecalciferol  1,000 Units Oral Daily   clopidogrel  75 mg Oral Q breakfast   enoxaparin (LOVENOX) injection  40 mg Subcutaneous Q24H   feeding supplement  237 mL Oral BID BM   lamoTRIgine  25 mg Oral BID   melatonin  5  mg Oral QHS   multivitamin with minerals  1 tablet Oral Daily   OLANZapine  5 mg Oral QHS   polyethylene glycol  34 g Oral Daily   senna-docusate  2 tablet Oral BID   sertraline  100 mg Oral Daily   sodium chloride flush  3 mL Intravenous Q12H   traZODone  100 mg Oral QHS   Continuous Infusions:  sodium chloride 50 mL/hr at 05/04/23 1520   sodium chloride     cefTRIAXone (ROCEPHIN)  IV 1 g (05/03/23 1744)   metronidazole 500 mg (05/04/23 0950)   vancomycin       LOS: 6 days     Pennie Banter, DO [facility]   If 7PM-7AM, please contact night-coverage [email or web address] Password [facility] 05/04/2023, 4:54 PM

## 2023-05-04 NOTE — Plan of Care (Signed)

## 2023-05-05 ENCOUNTER — Encounter: Payer: Self-pay | Admitting: Vascular Surgery

## 2023-05-05 DIAGNOSIS — M86072 Acute hematogenous osteomyelitis, left ankle and foot: Secondary | ICD-10-CM | POA: Diagnosis not present

## 2023-05-05 DIAGNOSIS — M869 Osteomyelitis, unspecified: Secondary | ICD-10-CM | POA: Diagnosis not present

## 2023-05-05 LAB — CBC
HCT: 33.9 % — ABNORMAL LOW (ref 36.0–46.0)
Hemoglobin: 10.6 g/dL — ABNORMAL LOW (ref 12.0–15.0)
MCH: 28.6 pg (ref 26.0–34.0)
MCHC: 31.3 g/dL (ref 30.0–36.0)
MCV: 91.4 fL (ref 80.0–100.0)
Platelets: 384 10*3/uL (ref 150–400)
RBC: 3.71 MIL/uL — ABNORMAL LOW (ref 3.87–5.11)
RDW: 12.8 % (ref 11.5–15.5)
WBC: 10.1 10*3/uL (ref 4.0–10.5)
nRBC: 0 % (ref 0.0–0.2)

## 2023-05-05 LAB — CREATININE, SERUM
Creatinine, Ser: 0.58 mg/dL (ref 0.44–1.00)
GFR, Estimated: >60 mL/min (ref >60)

## 2023-05-05 NOTE — Progress Notes (Signed)
  Subjective:  Patient ID: [name], female    DOB: [birth year],  MRN: [id number]  A 82 y.o. female presents with left second digit osteomeyelits now s/p angioplasty with optimal flow to the foot. She is doing well. No acute complaints   Objective:   Vitals:   05/05/23 0744 05/05/23 1501  BP: 138/64 110/71  Pulse: 80 89  Resp: 16 16  Temp: 98.7 F (37.1 C) 99.4 F (37.4 C)  SpO2: 95% 96%   General AA&O x3. Normal mood and affect.  Vascular Dorsalis pedis and posterior tibial pulses 2/4 bilat. Brisk capillary refill to all digits. Pedal hair present.  Neurologic Epicritic sensation grossly intact.  Dermatologic Ulcer overlying the dorsal aspect of the left second digit with drainage and interdigital maceration.  No malodor. Probe down to bone   Orthopedic: MMT 5/5 in dorsiflexion, plantarflexion, inversion, and eversion. Normal joint ROM without pain or crepitus.   1. Dorsal second toe ulceration with underlying osteomyelitis of the second proximal and middle phalanges. Forefoot cellulitis without abscess  Assessment & Plan:  Patient was evaluated and treated and all questions answered.  Left second toe ulcer with underlying osteomyelitis  -All questions and concerns were discussed. -NPO after midnight -OR tomorrow with Dr. Logan Bores for left second digit amputation -WBAT in sx shoe -Continue abx -betadine wet to dry dressings  Candelaria Stagers, DPM  Accessible via secure chat for questions or concerns.

## 2023-05-05 NOTE — Progress Notes (Signed)
PROGRESS NOTE    ZYKIRAH ARNT  UJW:[id number] DOB: [birth year] DOA: 04/28/2023 PCP: Almetta Lovely, [facility]  Outpatient Specialists: none    Brief Narrative:   FILIPPA DIMAANO is a 82 y.o. female with medical history significant of Alzheimer's dementia who lives in assisted living facility comes in on account of left foot pain that has been going on for the last 4 days.  According to patient she started noticing redness involving the left foot with associated swelling that have not been improving.  She denied history of diabetes, or any peripheral vascular disease that she knows of.  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 Lactate has normalized. - podiatry consulted, plan for likely amputation - vascular surgery following - angiogram 8/21 - with stent placed to L superficial femoral and popliteal arteries, thrombectomy of left SFA - continue ceftriaxone/vanc/flagyl  # PAD Vascular consulted, needs angiogram but this was initially on hold pending DSS authorization. After I complained to his DSS caseworker on 8/19 things were escalated to the supervisor and consent to proceed w/ angiogram was given on 8/19. Angiogram completed 8/21 as above - mgmt per vascular surgery - cont ASA/plavix  # Bipolar disorder - cont home olanzapine, sertraline, trazodone, lamotrigine, clonazepam  # Dementia No behavioral disturbance. Resides at [facility]  # Guardianship - DSS guardian   DVT prophylaxis: lovenox Code Status: dnr Family Communication: none @ bedside  Level of care: Med-Surg Status  is: Inpatient Remains inpatient appropriate because: need for IV abx, potential surgery    Consultants:  Vascular, podiatry  Procedures: Angiogram 05/04/23 - results pending   Antimicrobials:  See above    Subjective: Pt awake sitting up in bed this AM.  She reports ongoing foot pain, but no other complaints.  She wonders if she still needs toe amputated.  Objective: Vitals:   05/04/23 1923 05/05/23 0045 05/05/23 0510 05/05/23 0744  BP: 119/66 108/65 127/62 138/64  Pulse: 75 80 75 80  Resp: 17 18 17 16   Temp: 98.7 F (37.1 C) 99.5 F (37.5 C) 99.9 F (37.7 C) 98.7 F (37.1 C)  TempSrc: Oral Oral Oral   SpO2: 94% 94% 91% 95%  Weight:      Height:        Intake/Output Summary (Last 24 hours) at 05/05/2023 1426 Last data filed at 05/05/2023 1016 Gross per 24 hour  Intake 927.36 ml  Output --  Net 927.36 ml   Filed Weights   04/28/23 1206  Weight: 54.4 kg    Examination:  General exam: awake, alert, no acute distress HEENT: moist mucus membranes, hearing grossly normal  Respiratory system: CTAB diminished, no wheezes, rales or rhonchi, normal respiratory effort. Cardiovascular system: normal S1/S2, RRR, no pedal edema.   Gastrointestinal system: soft, NT, ND Central nervous system: A&O x3. no gross focal neurologic deficits, normal speech Extremities: left foot dressing in place, no edema, normal tone Skin: dry, intact, normal temperature Psychiatry: normal mood, congruent affect    Data Reviewed: I have personally reviewed following labs and imaging studies  CBC: Recent Labs  Lab 04/28/23 2136 05/02/23 0406 05/05/23 0519  WBC 10.9* 7.3 10.1  HGB 10.1* 10.7* 10.6*  HCT 31.8* 33.5* 33.9*  MCV 92.4 91.8 91.4  PLT 346 408* 384   Basic Metabolic Panel: Recent Labs  Lab 04/28/23 2136 04/30/23 0555 05/02/23 0406 05/05/23 0519  NA  --  138  --   --   K  --  4.1  --   --   CL  --  109  --   --   CO2  --  23  --   --   GLUCOSE  --  84  --   --    BUN  --  18  --   --   CREATININE 0.60 0.64 0.53 0.58  CALCIUM  --  8.9  --   --    GFR: Estimated Creatinine Clearance: 45.8 mL/min (by C-G formula based on SCr of 0.58 mg/dL). Liver Function Tests: No results for input(s): "AST", "ALT", "ALKPHOS", "BILITOT", "PROT", "ALBUMIN" in the last 168 hours.  No results for input(s): "LIPASE", "AMYLASE" in the last 168 hours. No results for input(s): "AMMONIA" in the last 168 hours. Coagulation Profile: No results for input(s): "INR", "PROTIME" in the last 168 hours. Cardiac Enzymes: No results for input(s): "CKTOTAL", "CKMB", "CKMBINDEX", "TROPONINI" in the last 168 hours. BNP (last 3 results) No results for input(s): "PROBNP" in the last 8760 hours. HbA1C: No results for input(s): "HGBA1C" in the last 72 hours.  CBG: No results for input(s): "GLUCAP" in the last 168 hours. Lipid Profile: No results for input(s): "CHOL", "HDL", "LDLCALC", "TRIG", "CHOLHDL", "LDLDIRECT" in the last 72 hours. Thyroid Function Tests: No results for input(s): "TSH", "T4TOTAL", "FREET4", "T3FREE", "THYROIDAB" in the last 72 hours. Anemia Panel: No results for input(s): "VITAMINB12", "FOLATE", "FERRITIN", "TIBC", "IRON", "RETICCTPCT" in the last 72 hours. Urine analysis:    Component Value Date/Time   COLORURINE YELLOW (A) 03/28/2021 0956   APPEARANCEUR CLEAR (A) 03/28/2021 0956   APPEARANCEUR Clear 10/22/2012 0404   LABSPEC 1.026 03/28/2021 0956   LABSPEC 1.035 10/22/2012 0404   PHURINE 5.0 03/28/2021 0956   GLUCOSEU 50 (A) 03/28/2021 0956   GLUCOSEU Negative 10/22/2012 0404   HGBUR NEGATIVE 03/28/2021 0956   BILIRUBINUR NEGATIVE 03/28/2021 0956   BILIRUBINUR Negative 10/22/2012 0404   KETONESUR NEGATIVE 03/28/2021 0956   PROTEINUR NEGATIVE 03/28/2021 0956   UROBILINOGEN 0.2 06/14/2011 1805   NITRITE NEGATIVE 03/28/2021 0956   LEUKOCYTESUR TRACE (A) 03/28/2021 0956   LEUKOCYTESUR Negative 10/22/2012 0404   Sepsis  Labs: @LABRCNTIP (procalcitonin:4,lacticidven:4)  ) Recent Results (from the past 240 hour(s))  Blood culture (routine x 2)     Status: Abnormal   Collection Time: 04/28/23  6:24 PM   Specimen: BLOOD  Result Value Ref Range Status   Specimen Description   Final    BLOOD BLOOD RIGHT ARM Performed at [facility], [address] [place], [place] [address]    Special Requests   Final    BOTTLES DRAWN AEROBIC AND ANAEROBIC Blood Culture results may not be optimal due to an inadequate volume of blood received in culture bottles Performed at [facility], [address] [place], [place] [address]    Culture  Setup Time   Final    GRAM POSITIVE COCCI ANAEROBIC BOTTLE ONLY STAPHYLOCOCCUS SPECIES CRITICAL RESULT CALLED TO, READ BACK BY AND VERIFIED WITH: NATHAN BELEU @ 2132 04/29/23 [facility]    Culture (A)  Final    STAPHYLOCOCCUS HOMINIS THE SIGNIFICANCE OF ISOLATING THIS ORGANISM FROM  A SINGLE SET OF BLOOD CULTURES WHEN MULTIPLE SETS ARE DRAWN IS UNCERTAIN. PLEASE NOTIFY THE MICROBIOLOGY DEPARTMENT WITHIN ONE WEEK IF SPECIATION AND SENSITIVITIES ARE REQUIRED. Performed at [facility], [address], [place], [place] [address]    Report Status 05/02/2023 FINAL  Final  Blood culture (routine x 2)     Status: None   Collection Time: 04/28/23  6:24 PM   Specimen: BLOOD  Result Value Ref Range Status   Specimen Description BLOOD BLOOD RIGHT FOREARM  Final   Special Requests   Final    BOTTLES DRAWN AEROBIC AND ANAEROBIC Blood Culture results may not be optimal due to an inadequate volume of blood received in culture bottles   Culture   Final    NO GROWTH 5 DAYS Performed at [facility], [address] [place], [place] [address]    Report Status 05/03/2023 FINAL  Final  Blood Culture ID Panel (Reflexed)     Status: Abnormal   Collection Time: 04/28/23  6:24 PM  Result Value Ref Range Status   Enterococcus faecalis NOT DETECTED NOT DETECTED Final    Enterococcus Faecium NOT DETECTED NOT DETECTED Final   Listeria monocytogenes NOT DETECTED NOT DETECTED Final   Staphylococcus species DETECTED (A) NOT DETECTED Final    Comment: CRITICAL RESULT CALLED TO, READ BACK BY AND VERIFIED WITH: NATHAN BELUE @ 2132 04/29/23 [facility]    Staphylococcus aureus (BCID) NOT DETECTED NOT DETECTED Final   Staphylococcus epidermidis NOT DETECTED NOT DETECTED Final   Staphylococcus lugdunensis NOT DETECTED NOT DETECTED Final   Streptococcus species NOT DETECTED NOT DETECTED Final   Streptococcus agalactiae NOT DETECTED NOT DETECTED Final   Streptococcus pneumoniae NOT DETECTED NOT DETECTED Final   Streptococcus pyogenes NOT DETECTED NOT DETECTED Final   A.calcoaceticus-baumannii NOT DETECTED NOT DETECTED Final   Bacteroides fragilis NOT DETECTED NOT DETECTED Final   Enterobacterales NOT DETECTED NOT DETECTED Final   Enterobacter cloacae complex NOT DETECTED NOT DETECTED Final   Escherichia coli NOT DETECTED NOT DETECTED Final   Klebsiella aerogenes NOT DETECTED NOT DETECTED Final   Klebsiella oxytoca NOT DETECTED NOT DETECTED Final   Klebsiella pneumoniae NOT DETECTED NOT DETECTED Final   Proteus species NOT DETECTED NOT DETECTED Final   Salmonella species NOT DETECTED NOT DETECTED Final   Serratia marcescens NOT DETECTED NOT DETECTED Final   Haemophilus influenzae NOT DETECTED NOT DETECTED Final   Neisseria meningitidis NOT DETECTED NOT DETECTED Final   Pseudomonas aeruginosa NOT DETECTED NOT DETECTED Final   Stenotrophomonas maltophilia NOT DETECTED NOT DETECTED Final   Candida albicans NOT DETECTED NOT DETECTED Final   Candida auris NOT DETECTED NOT DETECTED Final   Candida glabrata NOT DETECTED NOT DETECTED Final   Candida krusei NOT DETECTED NOT DETECTED Final   Candida parapsilosis NOT DETECTED NOT DETECTED Final   Candida tropicalis NOT DETECTED NOT DETECTED Final   Cryptococcus neoformans/gattii NOT DETECTED NOT DETECTED Final    Comment:  Performed at [facility], [address] [place], [place] [address]         Radiology Studies: PERIPHERAL VASCULAR CATHETERIZATION  Result Date: 05/04/2023 See surgical note for result.       Scheduled Meds:  aspirin EC  81 mg Oral Daily   atorvastatin  10 mg Oral Daily   cholecalciferol  1,000 Units Oral Daily   clopidogrel  75 mg Oral Q breakfast   enoxaparin (LOVENOX) injection  40 mg Subcutaneous Q24H   feeding supplement  237 mL Oral BID  BM   lamoTRIgine  25 mg Oral BID   melatonin  5 mg Oral QHS   multivitamin with minerals  1 tablet Oral Daily   OLANZapine  5 mg Oral QHS   polyethylene glycol  34 g Oral Daily   senna-docusate  2 tablet Oral BID   sertraline  100 mg Oral Daily   sodium chloride flush  3 mL Intravenous Q12H   traZODone  100 mg Oral QHS   Continuous Infusions:  sodium chloride     cefTRIAXone (ROCEPHIN)  IV 1 g (05/04/23 1717)   metronidazole 500 mg (05/05/23 1016)   vancomycin 1,000 mg (05/05/23 1148)     LOS: 7 days     Pennie Banter, DO [facility]   If 7PM-7AM, please contact night-coverage [email or web address] Password [email or web address] 05/05/2023, 2:26 PM

## 2023-05-05 NOTE — [place] FL2 (Signed)
[place] MEDICAID FL2 LEVEL OF CARE FORM     IDENTIFICATION  Patient Name: [name] Birthdate: 07/29/1941 Sex: female Admission Date (Current Location): 04/28/2023  [facility] and [place] Number:  Chiropodist and Address:  [facility], 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) 04/29/2023   Cellulitis 04/28/2023   HCAP (healthcare-associated pneumonia) 12/19/2020   PAD (peripheral artery disease) (HCC) 12/19/2020   HLD (hyperlipidemia) 12/19/2020   Insomnia 12/19/2020   GERD (gastroesophageal reflux disease) 12/19/2020   Bipolar disorder (HCC) 12/15/2020   Dementia with behavioral disturbance (HCC) 12/15/2020   SOB (shortness of breath) 12/12/2020   Acute respiratory failure with hypoxia (HCC) 12/12/2020   Community acquired pneumonia 12/12/2020   Pressure injury of skin 09/25/2019   Fall    Closed left subtrochanteric femur fracture (HCC) 09/21/2019    Orientation RESPIRATION BLADDER Height & Weight     Self, Time, Situation, Place  Normal Continent, External catheter Weight: 54.4 kg Height:  5\' 7"  (170.2 cm)  BEHAVIORAL SYMPTOMS/MOOD NEUROLOGICAL BOWEL NUTRITION STATUS      Continent Diet (See DC summary)  AMBULATORY STATUS COMMUNICATION OF NEEDS Skin   Extensive Assist Verbally Normal, Surgical wounds                       Personal Care Assistance Level of Assistance  Bathing, Feeding, Dressing Bathing Assistance: Limited  assistance Feeding assistance: Independent Dressing Assistance: Limited assistance     Functional Limitations Info  Sight, Hearing, Speech Sight Info: Adequate Hearing Info: Adequate Speech Info: Adequate    SPECIAL CARE FACTORS FREQUENCY  PT (By licensed PT), OT (By licensed OT)     PT Frequency: 5 times per week OT Frequency: 5 times per week            Contractures Contractures Info: Not present    Additional Factors Info  Code Status, Allergies Code Status Info: DNR Allergies Info: NKDA           Current Medications (05/05/2023):  This is the current hospital active medication list Current Facility-Administered Medications  Medication Dose Route Frequency Provider Last Rate Last Admin   0.9 %  sodium chloride infusion  250 mL Intravenous PRN Georgiana Spinner, NP       acetaminophen (TYLENOL) tablet 500 mg  500 mg Oral Q4H PRN Loyce Dys, MD       aspirin EC tablet 81 mg  81 mg Oral Daily Sheppard Plumber E, NP   81 mg at 05/05/23 1019   atorvastatin (LIPITOR) tablet 10 mg  10 mg Oral Daily Sheppard Plumber E, NP   10 mg at 05/05/23 1020   cefTRIAXone (ROCEPHIN) 1 g in sodium chloride 0.9 % 100 mL IVPB  1 g Intravenous Q24H Kathrynn Running, MD 200 mL/hr at 05/04/23 1717 1 g at 05/04/23 1717   cholecalciferol (VITAMIN D3) 25 MCG (1000 UNIT) tablet 1,000 Units  1,000 Units Oral Daily Loyce Dys, MD  1,000 Units at 05/05/23 1020   clonazePAM (KLONOPIN) tablet 0.5 mg  0.5 mg Oral BID PRN Loyce Dys, MD       clopidogrel (PLAVIX) tablet 75 mg  75 mg Oral Q breakfast Georgiana Spinner, NP   75 mg at 05/05/23 0756   enoxaparin (LOVENOX) injection 40 mg  40 mg Subcutaneous Q24H Rosezetta Schlatter T, MD   40 mg at 05/04/23 2241   feeding supplement (ENSURE ENLIVE / ENSURE PLUS) liquid 237 mL  237 mL Oral BID BM Rosezetta Schlatter T, MD   237 mL at 05/05/23 1019   hydrALAZINE (APRESOLINE) injection 5 mg  5 mg Intravenous Q20 Min PRN Georgiana Spinner, NP       labetalol (NORMODYNE)  injection 10 mg  10 mg Intravenous Q10 min PRN Georgiana Spinner, NP       lamoTRIgine (LAMICTAL) tablet 25 mg  25 mg Oral BID Rosezetta Schlatter T, MD   25 mg at 05/05/23 1020   melatonin tablet 5 mg  5 mg Oral QHS Rosezetta Schlatter T, MD   5 mg at 05/04/23 2241   metroNIDAZOLE (FLAGYL) IVPB 500 mg  500 mg Intravenous Q12H Kathrynn Running, MD 100 mL/hr at 05/05/23 1016 500 mg at 05/05/23 1016   morphine (PF) 2 MG/ML injection 2 mg  2 mg Intravenous Q4H PRN Esaw Grandchild A, DO       multivitamin with minerals tablet 1 tablet  1 tablet Oral Daily Rosezetta Schlatter T, MD   1 tablet at 05/05/23 1020   OLANZapine (ZYPREXA) tablet 5 mg  5 mg Oral QHS Rosezetta Schlatter T, MD   5 mg at 05/04/23 2241   ondansetron (ZOFRAN) injection 4 mg  4 mg Intravenous Q6H PRN Georgiana Spinner, NP       ondansetron (ZOFRAN-ODT) disintegrating tablet 4 mg  4 mg Oral Q6H PRN Wouk, Wilfred Curtis, MD       oxyCODONE (Oxy IR/ROXICODONE) immediate release tablet 5-10 mg  5-10 mg Oral Q4H PRN Esaw Grandchild A, DO   5 mg at 05/04/23 2241   polyethylene glycol (MIRALAX / GLYCOLAX) packet 34 g  34 g Oral Daily Kathrynn Running, MD   34 g at 05/05/23 1017   senna-docusate (Senokot-S) tablet 2 tablet  2 tablet Oral BID Rosezetta Schlatter T, MD   2 tablet at 05/05/23 1019   sertraline (ZOLOFT) tablet 100 mg  100 mg Oral Daily Rosezetta Schlatter T, MD   100 mg at 05/05/23 1020   sodium chloride flush (NS) 0.9 % injection 3 mL  3 mL Intravenous Q12H Sheppard Plumber E, NP   3 mL at 05/05/23 1021   sodium chloride flush (NS) 0.9 % injection 3 mL  3 mL Intravenous PRN Georgiana Spinner, NP       traZODone (DESYREL) tablet 100 mg  100 mg Oral QHS Rosezetta Schlatter T, MD   100 mg at 05/04/23 2241   vancomycin (VANCOCIN) IVPB 1000 mg/200 mL premix  1,000 mg Intravenous Q24H Otelia Sergeant, [facility]         Discharge Medications: Please see discharge summary for a list of discharge medications.  Relevant Imaging Results:  Relevant Lab Results:   Additional  Information SS#: [id number]. Has a legal guardian through [facility] DSS: Jori Moll [phone]. Lives at [facility].  Marlowe Sax, RN

## 2023-05-05 NOTE — TOC Progression Note (Signed)
Transition of Care [facility]) - Progression Note    Patient Details  Name: JEREMIE SALTARELLI MRN: [id number] Date of Birth: 24-Feb-1941  Transition of [facility]) CM/SW Contact  Marlowe Sax, RN Phone Number: 05/05/2023, 10:13 AM  Clinical Narrative:    TOC continues to follow the patient and will assist in DC planningm, she resides at [facility], Has DSS as Legal guardian, will likely have to DC to SNF prior to returning to [facility]   Expected Discharge Plan: Assisted Living Barriers to Discharge: Continued Medical Work up  Expected Discharge Plan and Services       Living arrangements for the past 2 months: Assisted Living Facility                                       Social Determinants of Health (SDOH) Interventions SDOH Screenings   Tobacco Use: Medium Risk (05/05/2023)    Readmission Risk Interventions    12/25/2020    3:26 PM  Readmission Risk Prevention Plan  Transportation Screening Complete  PCP or Specialist Appt within 3-5 Days Complete  HRI or Home Care Consult Complete  Social Work Consult for Recovery Care Planning/Counseling Complete  Palliative Care Screening Complete  Medication Review Oceanographer) Complete

## 2023-05-05 NOTE — Progress Notes (Signed)
Patient refused scheduled PO medicine and Lovenox, the author educate the patient on the importance of her schedule medicine but the patient state that she took her medicine  in the morning and she don't want to take any medicine at this time.

## 2023-05-05 NOTE — Plan of Care (Signed)

## 2023-05-06 ENCOUNTER — Encounter
Admission: EM | Disposition: A | Discharge status: Skilled Nursing Facility | Payer: Self-pay | Source: Skilled Nursing Facility | Attending: Internal Medicine

## 2023-05-06 ENCOUNTER — Inpatient Hospital Stay: Payer: Medicare Other | Admitting: Certified Registered"

## 2023-05-06 ENCOUNTER — Other Ambulatory Visit: Payer: Self-pay

## 2023-05-06 DIAGNOSIS — M869 Osteomyelitis, unspecified: Secondary | ICD-10-CM | POA: Diagnosis not present

## 2023-05-06 HISTORY — PX: AMPUTATION TOE: SHX6595

## 2023-05-06 SURGERY — AMPUTATION, TOE
Anesthesia: Monitor Anesthesia Care | Site: Second Toe | Laterality: Left

## 2023-05-06 MED ORDER — PROPOFOL 500 MG/50ML IV EMUL: INTRAVENOUS | Status: DC | PRN

## 2023-05-06 MED ORDER — PHENYLEPHRINE 80 MCG/ML (10ML) SYRINGE FOR IV PUSH (FOR BLOOD PRESSURE SUPPORT)
PREFILLED_SYRINGE | INTRAVENOUS | Status: AC
  Filled 2023-05-06: qty 10

## 2023-05-06 MED ORDER — EPHEDRINE SULFATE (PRESSORS) 50 MG/ML IJ SOLN
INTRAMUSCULAR | Status: DC | PRN
  Administered 2023-05-06: 5 mg via INTRAVENOUS

## 2023-05-06 MED ORDER — FENTANYL CITRATE (PF) 100 MCG/2ML IJ SOLN: 25.0000 ug | INTRAMUSCULAR | Status: DC | PRN

## 2023-05-06 MED ORDER — BUPIVACAINE HCL 0.5 % IJ SOLN
INTRAMUSCULAR | Status: DC | PRN
  Administered 2023-05-06: 10 mL

## 2023-05-06 MED ORDER — OXYCODONE HCL 5 MG/5ML PO SOLN: 5.0000 mg | Freq: Once | ORAL | Status: DC | PRN

## 2023-05-06 MED ORDER — ACETAMINOPHEN 10 MG/ML IV SOLN: 1000.0000 mg | Freq: Once | INTRAVENOUS | Status: DC | PRN

## 2023-05-06 MED ORDER — PHENYLEPHRINE HCL (PRESSORS) 10 MG/ML IV SOLN
INTRAVENOUS | Status: DC | PRN
  Administered 2023-05-06 (×2): 80 ug via INTRAVENOUS

## 2023-05-06 MED ORDER — PROMETHAZINE HCL 25 MG/ML IJ SOLN: 6.2500 mg | INTRAMUSCULAR | Status: DC | PRN

## 2023-05-06 MED ORDER — DROPERIDOL 2.5 MG/ML IJ SOLN: 0.6250 mg | Freq: Once | INTRAMUSCULAR | Status: DC | PRN

## 2023-05-06 MED ORDER — 0.9 % SODIUM CHLORIDE (POUR BTL) OPTIME
TOPICAL | Status: DC | PRN
  Administered 2023-05-06: 500 mL

## 2023-05-06 MED ORDER — PROPOFOL 10 MG/ML IV BOLUS
INTRAVENOUS | Status: AC
  Filled 2023-05-06: qty 20

## 2023-05-06 MED ORDER — PROPOFOL 500 MG/50ML IV EMUL
INTRAVENOUS | Status: DC | PRN
  Administered 2023-05-06 (×11): 10 mg via INTRAVENOUS
  Administered 2023-05-06: 40 mg via INTRAVENOUS

## 2023-05-06 MED ORDER — OXYCODONE HCL 5 MG PO TABS: 5.0000 mg | ORAL_TABLET | Freq: Once | ORAL | Status: DC | PRN

## 2023-05-06 MED ORDER — EPHEDRINE 5 MG/ML INJ
INTRAVENOUS | Status: AC
  Filled 2023-05-06: qty 5

## 2023-05-06 MED ORDER — BUPIVACAINE HCL (PF) 0.5 % IJ SOLN
INTRAMUSCULAR | Status: AC
  Filled 2023-05-06: qty 30

## 2023-05-06 SURGICAL SUPPLY — 39 items
BLADE MED AGGRESSIVE (BLADE) IMPLANT
BLADE OSC/SAGITTAL MD 5.5X18 (BLADE) IMPLANT
BLADE SURG 15 STRL LF DISP TIS (BLADE) IMPLANT
BLADE SURG 15 STRL SS (BLADE)
BLADE SURG MINI STRL (BLADE) IMPLANT
BNDG ESMARCH 4 X 12 STRL LF (GAUZE/BANDAGES/DRESSINGS)
BNDG ESMARCH 4X12 STRL LF (GAUZE/BANDAGES/DRESSINGS) ×1 IMPLANT
BNDG GAUZE DERMACEA FLUFF 4 (GAUZE/BANDAGES/DRESSINGS) ×1 IMPLANT
BNDG GZE DERMACEA 4 6PLY (GAUZE/BANDAGES/DRESSINGS)
CNTNR URN SCR LID CUP LEK RST (MISCELLANEOUS) IMPLANT
CONT SPEC 4OZ STRL OR WHT (MISCELLANEOUS)
CUFF TOURN SGL QUICK 12 (TOURNIQUET CUFF) IMPLANT
CUFF TOURN SGL QUICK 18X4 (TOURNIQUET CUFF) IMPLANT
ELECT REM PT RETURN 9FT ADLT (ELECTROSURGICAL) ×1
ELECTRODE REM PT RTRN 9FT ADLT (ELECTROSURGICAL) ×1 IMPLANT
GAUZE SPONGE 4X4 12PLY STRL (GAUZE/BANDAGES/DRESSINGS) ×2 IMPLANT
GAUZE XEROFORM 1X8 LF (GAUZE/BANDAGES/DRESSINGS) IMPLANT
GLOVE BIOGEL M 7.0 STRL (GLOVE) ×1 IMPLANT
GLOVE BIOGEL PI IND STRL 7.5 (GLOVE) ×1 IMPLANT
GOWN STRL REUS W/ TWL LRG LVL3 (GOWN DISPOSABLE) ×2 IMPLANT
GOWN STRL REUS W/TWL LRG LVL3 (GOWN DISPOSABLE) ×2
KIT TURNOVER KIT A (KITS) ×1 IMPLANT
LABEL OR SOLS (LABEL) ×1 IMPLANT
MANIFOLD NEPTUNE II (INSTRUMENTS) ×1 IMPLANT
NDL BIOPSY JAMSHIDI 11X6 (NEEDLE) IMPLANT
NDL HYPO 25X1 1.5 SAFETY (NEEDLE) ×2 IMPLANT
NEEDLE BIOPSY JAMSHIDI 11X6 (NEEDLE) IMPLANT
NEEDLE HYPO 25X1 1.5 SAFETY (NEEDLE) ×2 IMPLANT
NS IRRIG 500ML POUR BTL (IV SOLUTION) ×1 IMPLANT
PACK EXTREMITY ARMC (MISCELLANEOUS) ×1 IMPLANT
PAD ABD DERMACEA PRESS 5X9 (GAUZE/BANDAGES/DRESSINGS) IMPLANT
SOL PREP PVP 2OZ (MISCELLANEOUS) ×1
SOLUTION PREP PVP 2OZ (MISCELLANEOUS) ×1 IMPLANT
SUT ETHILON 3-0 FS-10 30 BLK (SUTURE) ×1
SUTURE EHLN 3-0 FS-10 30 BLK (SUTURE) ×1 IMPLANT
SWAB CULTURE AMIES ANAERIB BLU (MISCELLANEOUS) IMPLANT
SYR 10ML LL (SYRINGE) ×1 IMPLANT
TRAP FLUID SMOKE EVACUATOR (MISCELLANEOUS) ×1 IMPLANT
WATER STERILE IRR 500ML POUR (IV SOLUTION) ×1 IMPLANT

## 2023-05-06 NOTE — Progress Notes (Signed)
PROGRESS NOTE    [name]  GNF:[id number] DOB: September 04, 1941 DOA: 04/28/2023 PCP: Almetta Lovely, [facility]  Outpatient Specialists: none    Brief Narrative:   [name] is a 82 y.o. female with medical history significant of Alzheimer's dementia who lives in assisted living facility comes in on account of left foot pain that has been going on for the last 4 days.  According to patient she started noticing redness involving the left foot with associated swelling that have not been improving.  She denied history of diabetes, or any peripheral vascular disease that she knows of.  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 Lactate has normalized. - podiatry consulted, plan for amputation today 8/22 - vascular surgery following - angiogram 8/21 - with stent placed to L superficial femoral and popliteal arteries, thrombectomy of left SFA - continue ceftriaxone/vanc/flagyl  # PAD Vascular consulted, needs angiogram but this was initially on hold pending DSS authorization. After I complained to his DSS caseworker on 8/19 things were escalated to the supervisor and consent to proceed w/ angiogram was given on 8/19. Angiogram completed 8/21 as above - mgmt per vascular surgery - cont ASA/plavix  # Bipolar disorder - cont home olanzapine, sertraline, trazodone, lamotrigine, clonazepam  # Dementia No behavioral disturbance. Resides at SNF Delirium precautions  # Guardianship - DSS guardian   DVT prophylaxis: lovenox Code Status: dnr Family Communication: none @ bedside  Level  of care: Med-Surg Status is: Inpatient Remains inpatient appropriate because: need for IV abx, surgery planned today    Consultants:  Vascular, podiatry  Procedures: Angiogram 05/04/23 - results pending   Antimicrobials:  See above    Subjective: Pt sleeping when seen this AM, arouses to voice.  Denies acute complaints.  Going to OR for 2nd toe amputation today.  Objective: Vitals:   05/05/23 2235 05/06/23 0852 05/06/23 1506 05/06/23 1515  BP:  129/60 (!) 134/57 (!) 99/49  Pulse: 81 76 64 73  Resp:  19 17 19   Temp:  97.9 F (36.6 C) (!) 97.4 F (36.3 C)   TempSrc:  Oral    SpO2: 96% 99% 100% 98%  Weight:      Height:        Intake/Output Summary (Last 24 hours) at 05/06/2023 1522 Last data filed at 05/06/2023 1505 Gross per 24 hour  Intake 1115 ml  Output 1253 ml  Net -138 ml   Filed Weights   04/28/23 1206  Weight: 54.4 kg    Examination:  General exam: somnolent, arouses to voice, no acute distress HEENT: moist mucus membranes, hearing grossly normal  Respiratory system: CTAB diminished, no wheezes, rales or rhonchi, normal respiratory effort. Cardiovascular system: normal S1/S2, RRR, no pedal edema.   Gastrointestinal system: soft, NT, ND Central nervous system: exam limited by somnolence Extremities: left foot dressing in place, no edema, normal tone Skin: dry, intact, normal temperature Psychiatry: exam limited by somnolence    Data Reviewed: I have personally reviewed following labs and imaging studies  CBC: Recent Labs  Lab 05/02/23 0406 05/05/23 0519  WBC 7.3 10.1  HGB 10.7* 10.6*  HCT 33.5* 33.9*  MCV 91.8 91.4  PLT 408* 384   Basic Metabolic Panel: Recent Labs  Lab 04/30/23 0555 05/02/23 0406 05/05/23 0519  NA 138  --   --   K 4.1  --   --   CL 109  --   --   CO2 23  --   --   GLUCOSE 84  --   --   BUN 18  --   --   CREATININE 0.64 0.53 0.58  CALCIUM 8.9  --   --    GFR: Estimated Creatinine Clearance: 45.8 mL/min (by  C-G formula based on SCr of 0.58 mg/dL). Liver Function Tests: No results for input(s): "AST", "ALT", "ALKPHOS", "BILITOT", "PROT", "ALBUMIN" in the last 168 hours.  No results for input(s): "LIPASE", "AMYLASE" in the last 168 hours. No results for input(s): "AMMONIA" in the last 168 hours. Coagulation Profile: No results for input(s): "INR", "PROTIME" in the last 168 hours. Cardiac Enzymes: No results for input(s): "CKTOTAL", "CKMB", "CKMBINDEX", "TROPONINI" in the last 168 hours. BNP (last 3 results) No results for input(s): "PROBNP" in the last 8760 hours. HbA1C: No results for input(s): "HGBA1C" in the last 72 hours.  CBG: No results for input(s): "GLUCAP" in the last 168 hours. Lipid Profile: No results for input(s): "CHOL", "HDL", "LDLCALC", "TRIG", "CHOLHDL", "LDLDIRECT" in the last 72 hours. Thyroid Function Tests: No results for input(s): "TSH", "T4TOTAL", "FREET4", "T3FREE", "THYROIDAB" in the last 72 hours. Anemia Panel: No results for input(s): "VITAMINB12", "FOLATE", "FERRITIN", "TIBC", "IRON", "RETICCTPCT" in the last 72 hours. Urine analysis:    Component Value Date/Time   COLORURINE YELLOW (A) 03/28/2021 0956   APPEARANCEUR CLEAR (A) 03/28/2021 0956   APPEARANCEUR Clear 10/22/2012 0404   LABSPEC 1.026 03/28/2021 0956   LABSPEC 1.035 10/22/2012 0404   PHURINE 5.0 03/28/2021 0956   GLUCOSEU 50 (A) 03/28/2021 0956   GLUCOSEU Negative 10/22/2012 0404   HGBUR NEGATIVE 03/28/2021 0956   BILIRUBINUR NEGATIVE 03/28/2021 0956   BILIRUBINUR Negative 10/22/2012 0404   KETONESUR NEGATIVE 03/28/2021 0956   PROTEINUR NEGATIVE 03/28/2021 0956   UROBILINOGEN 0.2 06/14/2011 1805   NITRITE NEGATIVE 03/28/2021 0956   LEUKOCYTESUR TRACE (A) 03/28/2021 0956   LEUKOCYTESUR Negative 10/22/2012 0404   Sepsis Labs: @LABRCNTIP (procalcitonin:4,lacticidven:4)  ) Recent Results (from the past 240 hour(s))  Blood culture (routine x 2)     Status: Abnormal   Collection Time:  04/28/23  6:24 PM   Specimen: BLOOD  Result Value Ref Range Status   Specimen Description   Final    BLOOD BLOOD RIGHT ARM Performed at [facility], [address], [place], [place] [address]    Special Requests   Final    BOTTLES DRAWN AEROBIC AND ANAEROBIC Blood Culture results may not be optimal due to an inadequate volume of blood received in culture bottles Performed at [facility], [address] [place], [place] [address]    Culture  Setup Time   Final    GRAM POSITIVE COCCI ANAEROBIC BOTTLE ONLY STAPHYLOCOCCUS SPECIES CRITICAL RESULT CALLED TO, READ BACK BY AND VERIFIED WITH: NATHAN BELEU @ 2132 04/29/23 [facility]    Culture (A)  Final    STAPHYLOCOCCUS HOMINIS THE SIGNIFICANCE OF ISOLATING THIS ORGANISM FROM A SINGLE SET OF BLOOD CULTURES WHEN MULTIPLE SETS ARE DRAWN IS UNCERTAIN. PLEASE NOTIFY THE MICROBIOLOGY DEPARTMENT WITHIN ONE WEEK IF SPECIATION AND SENSITIVITIES ARE REQUIRED. Performed at [facility], [address]  [place], [place] [address]    Report Status 05/02/2023 FINAL  Final  Blood culture (routine x 2)     Status: None   Collection Time: 04/28/23  6:24 PM   Specimen: BLOOD  Result Value Ref Range Status   Specimen Description BLOOD BLOOD RIGHT FOREARM  Final   Special Requests   Final    BOTTLES DRAWN AEROBIC AND ANAEROBIC Blood Culture results may not be optimal due to an inadequate volume of blood received in culture bottles   Culture   Final    NO GROWTH 5 DAYS Performed at [facility], [address] [place], [place] [address]    Report Status 05/03/2023 FINAL  Final  Blood Culture ID Panel (Reflexed)     Status: Abnormal   Collection Time: 04/28/23  6:24 PM  Result Value Ref Range Status   Enterococcus faecalis NOT DETECTED NOT DETECTED Final   Enterococcus Faecium NOT DETECTED NOT DETECTED Final   Listeria monocytogenes NOT DETECTED NOT DETECTED Final   Staphylococcus species DETECTED (A) NOT DETECTED Final     Comment: CRITICAL RESULT CALLED TO, READ BACK BY AND VERIFIED WITH: NATHAN BELUE @ 2132 04/29/23 [facility]    Staphylococcus aureus (BCID) NOT DETECTED NOT DETECTED Final   Staphylococcus epidermidis NOT DETECTED NOT DETECTED Final   Staphylococcus lugdunensis NOT DETECTED NOT DETECTED Final   Streptococcus species NOT DETECTED NOT DETECTED Final   Streptococcus agalactiae NOT DETECTED NOT DETECTED Final   Streptococcus pneumoniae NOT DETECTED NOT DETECTED Final   Streptococcus pyogenes NOT DETECTED NOT DETECTED Final   A.calcoaceticus-baumannii NOT DETECTED NOT DETECTED Final   Bacteroides fragilis NOT DETECTED NOT DETECTED Final   Enterobacterales NOT DETECTED NOT DETECTED Final   Enterobacter cloacae complex NOT DETECTED NOT DETECTED Final   Escherichia coli NOT DETECTED NOT DETECTED Final   Klebsiella aerogenes NOT DETECTED NOT DETECTED Final   Klebsiella oxytoca NOT DETECTED NOT DETECTED Final   Klebsiella pneumoniae NOT DETECTED NOT DETECTED Final   Proteus species NOT DETECTED NOT DETECTED Final   Salmonella species NOT DETECTED NOT DETECTED Final   Serratia marcescens NOT DETECTED NOT DETECTED Final   Haemophilus influenzae NOT DETECTED NOT DETECTED Final   Neisseria meningitidis NOT DETECTED NOT DETECTED Final   Pseudomonas aeruginosa NOT DETECTED NOT DETECTED Final   Stenotrophomonas maltophilia NOT DETECTED NOT DETECTED Final   Candida albicans NOT DETECTED NOT DETECTED Final   Candida auris NOT DETECTED NOT DETECTED Final   Candida glabrata NOT DETECTED NOT DETECTED Final   Candida krusei NOT DETECTED NOT DETECTED Final   Candida parapsilosis NOT DETECTED NOT DETECTED Final   Candida tropicalis NOT DETECTED NOT DETECTED Final   Cryptococcus neoformans/gattii NOT DETECTED NOT DETECTED Final    Comment: Performed at [facility], [address] [place], [place] [address]         Radiology Studies: No results found.      Scheduled Meds:  [MAR Hold]  aspirin EC  81 mg Oral Daily   [MAR Hold] atorvastatin  10 mg Oral Daily   [MAR Hold] cholecalciferol  1,000 Units Oral Daily   [MAR Hold] clopidogrel  75 mg Oral Q breakfast   [MAR Hold] enoxaparin (LOVENOX) injection  40 mg Subcutaneous Q24H   [MAR Hold] feeding supplement  237 mL Oral BID BM   [MAR Hold] lamoTRIgine  25 mg Oral BID   [MAR Hold] melatonin  5 mg Oral QHS   [MAR Hold] multivitamin with minerals  1 tablet Oral Daily   [  MAR Hold] OLANZapine  5 mg Oral QHS   [MAR Hold] polyethylene glycol  34 g Oral Daily   [MAR Hold] senna-docusate  2 tablet Oral BID   [MAR Hold] sertraline  100 mg Oral Daily   [MAR Hold] sodium chloride flush  3 mL Intravenous Q12H   [MAR Hold] traZODone  100 mg Oral QHS   Continuous Infusions:  [MAR Hold] sodium chloride Stopped (05/06/23 1505)   acetaminophen     [MAR Hold] cefTRIAXone (ROCEPHIN)  IV Stopped (05/05/23 1811)   [MAR Hold] metronidazole 500 mg (05/06/23 1049)   [MAR Hold] vancomycin 1,000 mg (05/06/23 1218)     LOS: 8 days     Pennie Banter, DO [facility]   If 7PM-7AM, please contact night-coverage [email or web address] Password [facility] 05/06/2023, 3:22 PM

## 2023-05-06 NOTE — Care Management Important Message (Signed)
Important Message  Patient Details  Name: [name] MRN: [id number] Date of Birth: 1941/01/11   Medicare Important Message Given:  Yes     Verita Schneiders Landra Howze 05/06/2023, 2:23 PM

## 2023-05-06 NOTE — Progress Notes (Signed)
Patient refused her scheduled medicine, patient is educate and advised the importance of it. Notified B. Jon Billings NP about patient meds refusal.

## 2023-05-06 NOTE — Plan of Care (Signed)
  Problem: Clinical Measurements: Goal: Ability to maintain clinical measurements within normal limits will improve Outcome: Progressing Goal: Diagnostic test results will improve Outcome: Progressing Goal: Respiratory complications will improve Outcome: Progressing Goal: Cardiovascular complication will be avoided Outcome: Progressing   Problem: Activity: Goal: Risk for activity intolerance will decrease Outcome: Progressing   Problem: Nutrition: Goal: Adequate nutrition will be maintained Outcome: Progressing   Problem: Elimination: Goal: Will not experience complications related to bowel motility Outcome: Progressing Goal: Will not experience complications related to urinary retention Outcome: Progressing   Problem: Pain Managment: Goal: General experience of comfort will improve Outcome: Progressing

## 2023-05-06 NOTE — Anesthesia Preprocedure Evaluation (Signed)
Anesthesia Evaluation  Patient identified by MRN, date of birth, ID band Patient awake    Reviewed: Allergy & Precautions, H&P , NPO status , Patient's Chart, lab work & pertinent test results, reviewed documented beta blocker date and time   Airway Mallampati: II  TM Distance: >3 FB Neck ROM: full    Dental no notable dental hx. (+) Teeth Intact   Pulmonary pneumonia, resolved, former smoker   Pulmonary exam normal breath sounds clear to auscultation       Cardiovascular Exercise Tolerance: Poor hypertension, On Medications + Peripheral Vascular Disease   Rhythm:regular Rate:Normal     Neuro/Psych  PSYCHIATRIC DISORDERS   Bipolar Disorder  Dementia negative neurological ROS     GI/Hepatic Neg liver ROS,GERD  Medicated,,  Endo/Other  negative endocrine ROSdiabetes    Renal/GU      Musculoskeletal   Abdominal   Peds  Hematology negative hematology ROS (+)   Anesthesia Other Findings   Reproductive/Obstetrics negative OB ROS                             Anesthesia Physical Anesthesia Plan  ASA: 3 and emergent  Anesthesia Plan: MAC   Post-op Pain Management:    Induction:   PONV Risk Score and Plan:   Airway Management Planned:   Additional Equipment:   Intra-op Plan:   Post-operative Plan:   Informed Consent: I have reviewed the patients History and Physical, chart, labs and discussed the procedure including the risks, benefits and alternatives for the proposed anesthesia with the patient or authorized representative who has indicated his/her understanding and acceptance.       Plan Discussed with: CRNA  Anesthesia Plan Comments:        Anesthesia Quick Evaluation

## 2023-05-06 NOTE — TOC Progression Note (Signed)
Transition of Care [facility]) - Progression Note    Patient Details  Name: [name] MRN: [id number] Date of Birth: [birth year]  Transition [facility]) CM/SW Contact  Marlowe Sax, RN Phone Number: 05/06/2023, 11:48 AM  Clinical Narrative:     OR today with Dr. Logan Bores for left second digit amputation Bedsearch sent for anticipation of STR SNF need   Expected Discharge Plan: Assisted Living Barriers to Discharge: Continued Medical Work up  Expected Discharge Plan and Services       Living arrangements for the past 2 months: Assisted Living Facility                                       Social Determinants of Health (SDOH) Interventions SDOH Screenings   Tobacco Use: Medium Risk (05/05/2023)    Readmission Risk Interventions    12/25/2020    3:26 PM  Readmission Risk Prevention Plan  Transportation Screening Complete  PCP or Specialist Appt within 3-5 Days Complete  HRI or Home Care Consult Complete  Social Work Consult for Recovery Care Planning/Counseling Complete  Palliative Care Screening Complete  Medication Review Oceanographer) Complete

## 2023-05-06 NOTE — Plan of Care (Signed)

## 2023-05-06 NOTE — Transfer of Care (Signed)
Immediate Anesthesia Transfer of Care Note  Patient: [name]  Procedure(s) Performed: AMPUTATION TOE 2nd toe (Left: Second Toe)  Patient Location: PACU  Anesthesia Type:MAC  Level of Consciousness: drowsy  Airway & Oxygen Therapy: Patient Spontanous Breathing and Patient connected to face mask oxygen  Post-op Assessment: Report given to RN and Post -op Vital signs reviewed and stable  Post vital signs: Reviewed and stable  Last Vitals:  Vitals Value Taken Time  BP 134/57 05/06/23 1506  Temp 35.8 1506  Pulse 71 05/06/23 1509  Resp 18 05/06/23 1509  SpO2 100 % 05/06/23 1509  Vitals shown include unfiled device data.  Last Pain:  Vitals:   05/06/23 0852  TempSrc: Oral  PainSc:       Patients Stated Pain Goal: 0 (05/04/23 2241)  Complications: No notable events documented.

## 2023-05-06 NOTE — Brief Op Note (Addendum)
05/06/2023  3:03 PM  PATIENT:  [name]  82 y.o. female  PRE-OPERATIVE DIAGNOSIS:  Osteomyelitis  POST-OPERATIVE DIAGNOSIS:  * No post-op diagnosis entered *  PROCEDURE:  Procedure(s): AMPUTATION TOE 2nd toe (Left)  SURGEON:  Surgeons and Role:    * Kazoua Gossen, Rachelle Hora, DPM - Primary  PHYSICIAN ASSISTANT:   ASSISTANTS: none   ANESTHESIA:   local and MAC  EBL:  5cc   BLOOD ADMINISTERED:none  DRAINS: none   LOCAL MEDICATIONS USED:  MARCAINE    and Amount: 10 ml  SPECIMEN:  left second toe  DISPOSITION OF SPECIMEN:  PATHOLOGY  COUNTS:  YES  TOURNIQUET:  * Missing tourniquet times found for documented tourniquets in log: [id number] *  DICTATION: .Note written in EPIC  PLAN OF CARE: Admit to inpatient   PATIENT DISPOSITION:  PACU - hemodynamically stable.   Delay start of Pharmacological VTE agent (>24hrs) due to surgical blood loss or risk of bleeding: no   WBAT in post op shoe No dressing changes at home needed OK to d/c on our end 7 days augmentin at discharge

## 2023-05-07 DIAGNOSIS — M869 Osteomyelitis, unspecified: Secondary | ICD-10-CM | POA: Diagnosis not present

## 2023-05-07 LAB — CBC
HCT: 31 % — ABNORMAL LOW (ref 36.0–46.0)
Hemoglobin: 9.6 g/dL — ABNORMAL LOW (ref 12.0–15.0)
MCH: 28.7 pg (ref 26.0–34.0)
MCHC: 31 g/dL (ref 30.0–36.0)
MCV: 92.5 fL (ref 80.0–100.0)
Platelets: 328 K/uL (ref 150–400)
RBC: 3.35 MIL/uL — ABNORMAL LOW (ref 3.87–5.11)
RDW: 12.8 % (ref 11.5–15.5)
WBC: 12 K/uL — ABNORMAL HIGH (ref 4.0–10.5)
nRBC: 0 % (ref 0.0–0.2)

## 2023-05-07 LAB — BASIC METABOLIC PANEL WITH GFR
Anion gap: 9 (ref 5–15)
BUN: 15 mg/dL (ref 8–23)
CO2: 23 mmol/L (ref 22–32)
Calcium: 8.6 mg/dL — ABNORMAL LOW (ref 8.9–10.3)
Chloride: 108 mmol/L (ref 98–111)
Creatinine, Ser: 0.58 mg/dL (ref 0.44–1.00)
GFR, Estimated: >60 mL/min
Glucose, Bld: 94 mg/dL (ref 70–99)
Potassium: 3.8 mmol/L (ref 3.5–5.1)
Sodium: 140 mmol/L (ref 135–145)

## 2023-05-07 MED ORDER — AMOXICILLIN-POT CLAVULANATE 875-125 MG PO TABS
1.0000 | ORAL_TABLET | Freq: Two times a day (BID) | ORAL | Status: DC
  Administered 2023-05-07 – 2023-05-10 (×3): 1 via ORAL
  Filled 2023-05-07 (×5): qty 1

## 2023-05-07 NOTE — Progress Notes (Signed)
  Subjective:  Patient ID: [name], female    DOB: 03/02/1941,  MRN: [id number]  POD #1 left second toe amputation  Feeling well not having any major pain  Negative for chest pain and shortness of breath Fever: no Night sweats: no Constitutional signs: no Review of all other systems is negative Objective:   Vitals:   05/07/23 0447 05/07/23 0803  BP: (!) 117/50 (!) 117/50  Pulse: 84 77  Resp: 20 19  Temp: 97.9 F (36.6 C) 98.4 F (36.9 C)  SpO2: 99% 98%   General AA&O x3. Normal mood and affect.  Vascular Foot is warm and well-perfused  Neurologic Epicritic sensation grossly intact.  Dermatologic Dressing dry with minimal strikethrough  Orthopedic: MMT 5/5 in dorsiflexion, plantarflexion, inversion, and eversion. Normal joint ROM without pain or crepitus.    Assessment & Plan:  Patient was evaluated and treated and all questions answered.  Left second toe infection status post amputation -WBAT in postop shoe -Will change dressing tomorrow  -Okay to discharge from our standpoint.  Would recommend 7 days p.o. antibiotics such as Augmentin at discharge -My office will arrange follow-up in 1 to 2 weeks for postop visit  Edwin Cap, DPM  Accessible via secure chat for questions or concerns.

## 2023-05-07 NOTE — TOC Progression Note (Signed)
Transition of Care [facility]) - Progression Note    Patient Details  Name: NAVIANA PANDIT MRN: [id number] Date of Birth: [birth year]  Transition of [facility]) CM/SW Contact  Liliana Cline, LCSW Phone Number: 05/07/2023, 1:57 PM  Clinical Narrative:    Patient medically ready for DC per DO. CSW called Huntley Dec at [facility]. Huntley Dec states patient will need STR before returning to [facility].  CSW called DSS Felix Ahmadi. Baruch Goldmann states she needs to talk to [facility] about this Monday before agreeing to SNF. Provided Sara's number and weekday RNCM's number. Kineshia to follow up Monday. Emailed list of current bed offers and Baruch Goldmann states she will review them for if patient does go to SNF.    Expected Discharge Plan: Assisted Living Barriers to Discharge: Continued Medical Work up  Expected Discharge Plan and Services       Living arrangements for the past 2 months: Assisted Living Facility                                       Social Determinants of Health (SDOH) Interventions SDOH Screenings   Tobacco Use: Medium Risk (05/05/2023)    Readmission Risk Interventions    12/25/2020    3:26 PM  Readmission Risk Prevention Plan  Transportation Screening Complete  PCP or Specialist Appt within 3-5 Days Complete  HRI or Home Care Consult Complete  Social Work Consult for Recovery Care Planning/Counseling Complete  Palliative Care Screening Complete  Medication Review [employer]) Complete

## 2023-05-07 NOTE — Op Note (Signed)
Patient Name: [name] DOB: Jun 29, 1941  MRN: [id number]   Date of Service: 04/28/2023 - 05/04/2023  Surgeon: Dr. Sharl Ma, DPM Assistants: None Pre-operative Diagnosis:  Osteomyelitis left foot Post-operative Diagnosis:  Osteomyelitis left foot Procedures: Procedures:   * AMPUTATION TOE 2nd toe Pathology/Specimens: ID Type Source Tests Collected by Time Destination  1 : Left  2nd toe amputation Tissue PATH Digit amputation SURGICAL PATHOLOGY Edwin Cap, DPM 05/06/2023 1452    Anesthesia: MAC with local Hemostasis: * Missing tourniquet times found for documented tourniquets in log: [id number] * Estimated Blood Loss: 3 mL Materials: * No implants in log * Medications: 10 cc 0.5% Marcaine plain Complications: No complications noted  Indications for Procedure:  This is a 82 y.o. female with a history of PAD in the left second toe infection.  Amputation of the digit was recommended due to the osteomyelitis. All risks, benefits and potential complications discussed prior to the procedure. All questions addressed. Informed consent signed and reviewed.      Procedure in Detail: Patient was identified in pre-operative holding area. Formal consent was signed and the left lower extremity was marked. Patient was brought back to the operating room. Anesthesia was induced. The extremity was prepped and draped in the usual sterile fashion. Timeout was taken to confirm patient name, laterality, and procedure prior to incision.   Attention was then directed to the left second toe where an incision was made and a racquet style incision. Dissection was carried down to level of bone.  Dissection was continued to the metatarsal phalangeal joint and all collateral ligaments were freed at the joint.  The bone soft tissue attachments of the proximal phalanx were removed and passed for pathology.  The remaining metatarsal head appeared healthy and viable.  The area was copiously irrigated.  The  skin was reapproximated with nylon and skin staples.   The foot was then dressed with Xeroform and dry sterile dressings. Patient tolerated the procedure well.   Disposition: Following a period of post-operative monitoring, patient will be transferred to the floor.

## 2023-05-07 NOTE — Progress Notes (Signed)
PROGRESS NOTE    [name]  ZOX:[id number] DOB: [birth year] DOA: 04/28/2023 PCP: Almetta Lovely, [facility]  Outpatient Specialists: none    Brief Narrative:   [name] is a 82 y.o. female with medical history significant of Alzheimer's dementia who lives in assisted living facility comes in on account of left foot pain that has been going on for the last 4 days.  According to patient she started noticing redness involving the left foot with associated swelling that have not been improving.  She denied history of diabetes, or any peripheral vascular disease that she knows of.  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 Lactate has normalized. - podiatry consulted, plan for amputation today 8/22 - vascular surgery following - angiogram 8/21 - with stent placed to L superficial femoral and popliteal arteries, thrombectomy of left SFA - treated with empiric IV ceftriaxone/vanc/flagyl - transition to PO Augmentin x 7 days per podiatry  # PAD Vascular consulted, needs angiogram but this was initially on hold pending DSS authorization. After I complained to his DSS caseworker on 8/19 things were escalated to the supervisor and consent to proceed w/ angiogram was given on 8/19. Angiogram completed 8/21 as above - mgmt per vascular surgery - cont ASA/plavix  # Bipolar disorder - cont home olanzapine, sertraline, trazodone, lamotrigine, clonazepam  # Dementia No behavioral disturbance. Resides at SNF Delirium precautions  # Guardianship - DSS guardian   DVT prophylaxis:  lovenox Code Status: dnr Family Communication: none @ bedside  Level of care: Med-Surg Status is: Inpatient Remains inpatient appropriate because: stable for d/c back to SNF when facility can accept    Consultants:  Vascular, podiatry  Procedures: Angiogram 05/04/23 - results pending   Antimicrobials:  See above    Subjective: Pt was sleeping on her side, woke easily to voice this AM.  She states she feels fine, denies complaints. No acute events reported.   Objective: Vitals:   05/06/23 2348 05/07/23 0447 05/07/23 0803 05/07/23 1230  BP: 106/72 (!) 117/50 (!) 117/50 (!) 115/52  Pulse: 88 84 77 78  Resp: 18 20 19 17   Temp: 98.7 F (37.1 C) 97.9 F (36.6 C) 98.4 F (36.9 C) 98.3 F (36.8 C)  TempSrc:      SpO2: 98% 99% 98% 98%  Weight:      Height:        Intake/Output Summary (Last 24 hours) at 05/07/2023 1342 Last data filed at 05/06/2023 1505 Gross per 24 hour  Intake 200 ml  Output 3 ml  Net 197 ml   Filed Weights   04/28/23 1206  Weight: 54.4 kg    Examination:  General exam: somnolent, arouses to voice, no acute distress HEENT: moist mucus membranes, hearing grossly normal  Respiratory system: CTAB diminished, no wheezes, rales or rhonchi, normal respiratory effort. Cardiovascular system: normal S1/S2, RRR, no pedal edema.   Gastrointestinal system: soft, NT, ND Central nervous system: exam limited by somnolence Extremities: left foot dressing in place, no edema, normal tone Skin: dry, intact, normal temperature Psychiatry: exam limited by  somnolence    Data Reviewed: I have personally reviewed following labs and imaging studies  CBC: Recent Labs  Lab 05/02/23 0406 05/05/23 0519 05/07/23 0510  WBC 7.3 10.1 12.0*  HGB 10.7* 10.6* 9.6*  HCT 33.5* 33.9* 31.0*  MCV 91.8 91.4 92.5  PLT 408* 384 328   Basic Metabolic Panel: Recent Labs  Lab 05/02/23 0406 05/05/23 0519 05/07/23 0510  NA  --   --  140  K  --   --  3.8  CL  --   --   108  CO2  --   --  23  GLUCOSE  --   --  94  BUN  --   --  15  CREATININE 0.53 0.58 0.58  CALCIUM  --   --  8.6*   GFR: Estimated Creatinine Clearance: 45.8 mL/min (by C-G formula based on SCr of 0.58 mg/dL). Liver Function Tests: No results for input(s): "AST", "ALT", "ALKPHOS", "BILITOT", "PROT", "ALBUMIN" in the last 168 hours.  No results for input(s): "LIPASE", "AMYLASE" in the last 168 hours. No results for input(s): "AMMONIA" in the last 168 hours. Coagulation Profile: No results for input(s): "INR", "PROTIME" in the last 168 hours. Cardiac Enzymes: No results for input(s): "CKTOTAL", "CKMB", "CKMBINDEX", "TROPONINI" in the last 168 hours. BNP (last 3 results) No results for input(s): "PROBNP" in the last 8760 hours. HbA1C: No results for input(s): "HGBA1C" in the last 72 hours.  CBG: No results for input(s): "GLUCAP" in the last 168 hours. Lipid Profile: No results for input(s): "CHOL", "HDL", "LDLCALC", "TRIG", "CHOLHDL", "LDLDIRECT" in the last 72 hours. Thyroid Function Tests: No results for input(s): "TSH", "T4TOTAL", "FREET4", "T3FREE", "THYROIDAB" in the last 72 hours. Anemia Panel: No results for input(s): "VITAMINB12", "FOLATE", "FERRITIN", "TIBC", "IRON", "RETICCTPCT" in the last 72 hours. Urine analysis:    Component Value Date/Time   COLORURINE YELLOW (A) 03/28/2021 0956   APPEARANCEUR CLEAR (A) 03/28/2021 0956   APPEARANCEUR Clear 10/22/2012 0404   LABSPEC 1.026 03/28/2021 0956   LABSPEC 1.035 10/22/2012 0404   PHURINE 5.0 03/28/2021 0956   GLUCOSEU 50 (A) 03/28/2021 0956   GLUCOSEU Negative 10/22/2012 0404   HGBUR NEGATIVE 03/28/2021 0956   BILIRUBINUR NEGATIVE 03/28/2021 0956   BILIRUBINUR Negative 10/22/2012 0404   KETONESUR NEGATIVE 03/28/2021 0956   PROTEINUR NEGATIVE 03/28/2021 0956   UROBILINOGEN 0.2 06/14/2011 1805   NITRITE NEGATIVE 03/28/2021 0956   LEUKOCYTESUR TRACE (A) 03/28/2021 0956   LEUKOCYTESUR Negative 10/22/2012 0404   Sepsis  Labs: @LABRCNTIP (procalcitonin:4,lacticidven:4)  ) Recent Results (from the past 240 hour(s))  Blood culture (routine x 2)     Status: Abnormal   Collection Time: 04/28/23  6:24 PM   Specimen: BLOOD  Result Value Ref Range Status   Specimen Description   Final    BLOOD BLOOD RIGHT ARM Performed at [facility], [address] [place], [place] [address]    Special Requests   Final    BOTTLES DRAWN AEROBIC AND ANAEROBIC Blood Culture results may not be optimal due to an inadequate volume of blood received in culture bottles Performed at [facility], [address] [place], [place] [address]    Culture  Setup Time   Final    GRAM POSITIVE COCCI ANAEROBIC BOTTLE ONLY STAPHYLOCOCCUS SPECIES CRITICAL RESULT CALLED TO, READ BACK BY AND VERIFIED WITH: NATHAN BELEU @ 2132 04/29/23 [facility]    Culture (A)  Final    STAPHYLOCOCCUS HOMINIS THE SIGNIFICANCE OF ISOLATING THIS ORGANISM FROM A SINGLE SET OF  BLOOD CULTURES WHEN MULTIPLE SETS ARE DRAWN IS UNCERTAIN. PLEASE NOTIFY THE MICROBIOLOGY DEPARTMENT WITHIN ONE WEEK IF SPECIATION AND SENSITIVITIES ARE REQUIRED. Performed at [facility], [address], [place], [place] [address]    Report Status 05/02/2023 FINAL  Final  Blood culture (routine x 2)     Status: None   Collection Time: 04/28/23  6:24 PM   Specimen: BLOOD  Result Value Ref Range Status   Specimen Description BLOOD BLOOD RIGHT FOREARM  Final   Special Requests   Final    BOTTLES DRAWN AEROBIC AND ANAEROBIC Blood Culture results may not be optimal due to an inadequate volume of blood received in culture bottles   Culture   Final    NO GROWTH 5 DAYS Performed at [facility], [address] [place], [place] [address]    Report Status 05/03/2023 FINAL  Final  Blood Culture ID Panel (Reflexed)     Status: Abnormal   Collection Time: 04/28/23  6:24 PM  Result Value Ref Range Status   Enterococcus faecalis NOT DETECTED NOT DETECTED Final    Enterococcus Faecium NOT DETECTED NOT DETECTED Final   Listeria monocytogenes NOT DETECTED NOT DETECTED Final   Staphylococcus species DETECTED (A) NOT DETECTED Final    Comment: CRITICAL RESULT CALLED TO, READ BACK BY AND VERIFIED WITH: NATHAN BELUE @ 2132 04/29/23 [facility]    Staphylococcus aureus (BCID) NOT DETECTED NOT DETECTED Final   Staphylococcus epidermidis NOT DETECTED NOT DETECTED Final   Staphylococcus lugdunensis NOT DETECTED NOT DETECTED Final   Streptococcus species NOT DETECTED NOT DETECTED Final   Streptococcus agalactiae NOT DETECTED NOT DETECTED Final   Streptococcus pneumoniae NOT DETECTED NOT DETECTED Final   Streptococcus pyogenes NOT DETECTED NOT DETECTED Final   A.calcoaceticus-baumannii NOT DETECTED NOT DETECTED Final   Bacteroides fragilis NOT DETECTED NOT DETECTED Final   Enterobacterales NOT DETECTED NOT DETECTED Final   Enterobacter cloacae complex NOT DETECTED NOT DETECTED Final   Escherichia coli NOT DETECTED NOT DETECTED Final   Klebsiella aerogenes NOT DETECTED NOT DETECTED Final   Klebsiella oxytoca NOT DETECTED NOT DETECTED Final   Klebsiella pneumoniae NOT DETECTED NOT DETECTED Final   Proteus species NOT DETECTED NOT DETECTED Final   Salmonella species NOT DETECTED NOT DETECTED Final   Serratia marcescens NOT DETECTED NOT DETECTED Final   Haemophilus influenzae NOT DETECTED NOT DETECTED Final   Neisseria meningitidis NOT DETECTED NOT DETECTED Final   Pseudomonas aeruginosa NOT DETECTED NOT DETECTED Final   Stenotrophomonas maltophilia NOT DETECTED NOT DETECTED Final   Candida albicans NOT DETECTED NOT DETECTED Final   Candida auris NOT DETECTED NOT DETECTED Final   Candida glabrata NOT DETECTED NOT DETECTED Final   Candida krusei NOT DETECTED NOT DETECTED Final   Candida parapsilosis NOT DETECTED NOT DETECTED Final   Candida tropicalis NOT DETECTED NOT DETECTED Final   Cryptococcus neoformans/gattii NOT DETECTED NOT DETECTED Final    Comment:  Performed at [facility], [address] [place], [place] [address]         Radiology Studies: No results found.      Scheduled Meds:  amoxicillin-clavulanate  1 tablet Oral Q12H   aspirin EC  81 mg Oral Daily   atorvastatin  10 mg Oral Daily   cholecalciferol  1,000 Units Oral Daily   clopidogrel  75 mg Oral Q breakfast   enoxaparin (LOVENOX) injection  40 mg Subcutaneous Q24H   feeding supplement  237 mL Oral BID BM   lamoTRIgine  25  mg Oral BID   melatonin  5 mg Oral QHS   multivitamin with minerals  1 tablet Oral Daily   OLANZapine  5 mg Oral QHS   polyethylene glycol  34 g Oral Daily   senna-docusate  2 tablet Oral BID   sertraline  100 mg Oral Daily   sodium chloride flush  3 mL Intravenous Q12H   traZODone  100 mg Oral QHS   Continuous Infusions:  sodium chloride Stopped (05/06/23 1505)     LOS: 9 days     Pennie Banter, DO [facility]   If 7PM-7AM, please contact night-coverage [email or web address] Password [email or web address] 05/07/2023, 1:42 PM

## 2023-05-07 NOTE — Plan of Care (Signed)
  Problem: Education: Goal: Knowledge of General Education information will improve Description: Including pain rating scale, medication(s)/side effects and non-pharmacologic comfort measures Outcome: Progressing   Problem: Clinical Measurements: Goal: Ability to maintain clinical measurements within normal limits will improve Outcome: Progressing Goal: Will remain free from infection Outcome: Progressing Goal: Diagnostic test results will improve Outcome: Progressing Goal: Respiratory complications will improve Outcome: Progressing Goal: Cardiovascular complication will be avoided Outcome: Progressing   Problem: Coping: Goal: Level of anxiety will decrease Outcome: Progressing   Problem: Elimination: Goal: Will not experience complications related to bowel motility Outcome: Progressing Goal: Will not experience complications related to urinary retention Outcome: Progressing   Problem: Pain Managment: Goal: General experience of comfort will improve Outcome: Progressing   Problem: Safety: Goal: Ability to remain free from injury will improve Outcome: Progressing   Problem: Skin Integrity: Goal: Risk for impaired skin integrity will decrease Outcome: Progressing   Problem: Education: Goal: Understanding of CV disease, CV risk reduction, and recovery process will improve Outcome: Progressing Goal: Individualized Educational Video(s) Outcome: Progressing   Problem: Cardiovascular: Goal: Ability to achieve and maintain adequate cardiovascular perfusion will improve Outcome: Progressing Goal: Vascular access site(s) Level 0-1 will be maintained Outcome: Progressing   Problem: Health Behavior/Discharge Planning: Goal: Ability to safely manage health-related needs after discharge will improve Outcome: Progressing

## 2023-05-08 DIAGNOSIS — M869 Osteomyelitis, unspecified: Secondary | ICD-10-CM | POA: Diagnosis not present

## 2023-05-08 LAB — IRON AND TIBC
Iron: 16 ug/dL — ABNORMAL LOW (ref 28–170)
Saturation Ratios: 7 % — ABNORMAL LOW (ref 10.4–31.8)
TIBC: 244 ug/dL — ABNORMAL LOW (ref 250–450)
UIBC: 228 ug/dL

## 2023-05-08 LAB — CBC
HCT: 28.5 % — ABNORMAL LOW (ref 36.0–46.0)
Hemoglobin: 8.7 g/dL — ABNORMAL LOW (ref 12.0–15.0)
MCH: 28.4 pg (ref 26.0–34.0)
MCHC: 30.5 g/dL (ref 30.0–36.0)
MCV: 93.1 fL (ref 80.0–100.0)
Platelets: 381 10*3/uL (ref 150–400)
RBC: 3.06 MIL/uL — ABNORMAL LOW (ref 3.87–5.11)
RDW: 12.9 % (ref 11.5–15.5)
WBC: 10 10*3/uL (ref 4.0–10.5)
nRBC: 0 % (ref 0.0–0.2)

## 2023-05-08 LAB — FOLATE: Folate: 30 ng/mL (ref >5.9)

## 2023-05-08 LAB — FERRITIN: Ferritin: 59 ng/mL (ref 11–307)

## 2023-05-08 LAB — VITAMIN B12: Vitamin B-12: 256 pg/mL (ref 180–914)

## 2023-05-08 NOTE — Progress Notes (Addendum)
PROGRESS NOTE    [name]  NWG:[id number] DOB: 02-20-1941 DOA: 04/28/2023 PCP: Almetta Lovely, [facility]  Outpatient Specialists: none    Brief Narrative:   "[name] is a 82 y.o. female with medical history significant of Alzheimer's dementia who lives in assisted living facility comes in on account of left foot pain that has been going on for the last 4 days.  According to patient she started noticing redness involving the left foot with associated swelling that have not been improving.  She denied history of diabetes, or any peripheral vascular disease that she knows of.  She denies cough nausea vomiting abdominal pain or chest pain "  Further hospital course and management as outlined below. In summary, patient underwent LLE angiogram with intervention for PAD by vascular surgery on 8/21, followed by left 2nd toe amputation by podiatry on 8/22.  Treated initially with empiric IV antibiotics, transitioned to PO Augmentin after surgery.  Complication post-op factor has been patient refusal of medications and most basic patient care by nursing staff.  Prognosis guarded Pt is increased risk for increased morbidity and mortality due to refusal to take medications.  New LLE stent at risk for thrombosis without DAPT which pt refuses.  At risk for dehydration and electrolyte derangements with poor PO intake.  At risk for skin breakdown due to refusal to reposition in the bed.  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 Lactate has normalized. - podiatry consulted, plan for amputation today 8/22 - vascular surgery following - angiogram 8/21 - with  stent placed to L superficial femoral and popliteal arteries, thrombectomy of left SFA - treated with empiric IV ceftriaxone/vanc/flagyl - transition to PO Augmentin x 7 days per podiatry  # PAD Vascular consulted, needs angiogram but this was initially on hold pending DSS authorization. After I complained to his DSS caseworker on 8/19 things were escalated to the supervisor and consent to proceed w/ angiogram was given on 8/19. Angiogram completed 8/21 as above - mgmt per vascular surgery - cont ASA/plavix  # Refusing care -- pt refusing meds since 8/24, including her psychiatric medications.  Also resistant to repositioning in bed and other nursing care tasks.  Little PO intake. - will get psychiatry's input - continue to encourage pt to take meds, reposition, etc - check AM labs, monitor for signs of dehydration - palliative care consult if persistent  # Bipolar disorder - cont home olanzapine, sertraline, trazodone, lamotrigine, clonazepam  # Dementia No behavioral disturbance. Resides at SNF Delirium precautions  # Guardianship - DSS guardian   DVT prophylaxis: lovenox Code Status: dnr Family Communication: none @ bedside  Level of care: Med-Surg Status is: Inpatient Remains inpatient appropriate because: stable for d/c back to SNF when facility can accept    Consultants:  Vascular, podiatry  Procedures: As above - angiogram, left 2nd toe amputation   Antimicrobials:  See above    Subjective: Pt was resting again on her left side this AM, awake.  Nursing staff report that patient has been declining her medications since yesterday.  RN was able to get her to take ASA, plavix yesterday but  today pt won't take those.  I spoke to patient, stressed importance of ASA, Plavix and antibiotic, encourged her to at least take these. She stated she would.   RN went to room and opened blinds, pt became upset and irritable.  Nursing also report that patient refuses to be  repositioned.  When they eventually got her on to her right side yesterday, she quickly turned back to her left side.    Objective: Vitals:   05/07/23 2322 05/08/23 0753 05/08/23 0804 05/08/23 1602  BP: 113/64 (!) 99/47  (!) 98/50  Pulse: 75 79  73  Resp: 20 16  16   Temp: 98.7 F (37.1 C) 98 F (36.7 C)  99.1 F (37.3 C)  TempSrc:      SpO2: 99% (!) 88% 98% 98%  Weight:      Height:        Intake/Output Summary (Last 24 hours) at 05/08/2023 1832 Last data filed at 05/08/2023 1743 Gross per 24 hour  Intake 0 ml  Output 600 ml  Net -600 ml   Filed Weights   04/28/23 1206  Weight: 54.4 kg    Examination:  General exam: awake resting in bed, no acute distress, minimally interactive HEENT: moist mucus membranes, hearing grossly normal  Respiratory system: CTAB generally diminished, no wheezes, rales or rhonchi, normal respiratory effort. Cardiovascular system: normal S1/S2, RRR, no pedal edema.   Gastrointestinal system: soft, NT, ND Central nervous system: normal speech, grossly non-focal, exam limited by pt refusal to participate Extremities: left foot dressing in place, no edema, normal tone Skin: dry, intact, normal temperature Psychiatry: withdrawn, flat affect, no apparent hallucinations, unclear judgment and insight due to minimal interaction, answers questions appropriately    Data Reviewed: I have personally reviewed following labs and imaging studies  CBC: Recent Labs  Lab 05/02/23 0406 05/05/23 0519 05/07/23 0510 05/08/23 0510  WBC 7.3 10.1 12.0* 10.0  HGB 10.7* 10.6* 9.6* 8.7*  HCT 33.5* 33.9* 31.0* 28.5*  MCV 91.8 91.4 92.5 93.1  PLT 408* 384 328 381   Basic Metabolic Panel: Recent Labs  Lab 05/02/23 0406 05/05/23 0519 05/07/23 0510  NA  --   --  140  K  --   --  3.8  CL  --   --  108  CO2  --   --  23  GLUCOSE  --   --  94  BUN  --   --  15  CREATININE 0.53 0.58 0.58  CALCIUM  --   --  8.6*   GFR: Estimated Creatinine Clearance: 45.8  mL/min (by C-G formula based on SCr of 0.58 mg/dL). Liver Function Tests: No results for input(s): "AST", "ALT", "ALKPHOS", "BILITOT", "PROT", "ALBUMIN" in the last 168 hours.  No results for input(s): "LIPASE", "AMYLASE" in the last 168 hours. No results for input(s): "AMMONIA" in the last 168 hours. Coagulation Profile: No results for input(s): "INR", "PROTIME" in the last 168 hours. Cardiac Enzymes: No results for input(s): "CKTOTAL", "CKMB", "CKMBINDEX", "TROPONINI" in the last 168 hours. BNP (last 3 results) No results for input(s): "PROBNP" in the last 8760 hours. HbA1C: No results for input(s): "HGBA1C" in the last 72 hours.  CBG: No results for input(s): "GLUCAP" in the last 168 hours. Lipid Profile: No results for input(s): "CHOL", "HDL", "LDLCALC", "TRIG", "CHOLHDL", "LDLDIRECT" in the last 72 hours. Thyroid Function Tests: No results for input(s): "TSH", "T4TOTAL", "FREET4", "T3FREE", "THYROIDAB" in the last 72 hours. Anemia Panel: Recent Labs    05/07/23 0509 05/08/23  4540  VITAMINB12 256  --   FOLATE  --  30.0  FERRITIN  --  59  TIBC  --  244*  IRON  --  16*   Urine analysis:    Component Value Date/Time   COLORURINE YELLOW (A) 03/28/2021 0956   APPEARANCEUR CLEAR (A) 03/28/2021 0956   APPEARANCEUR Clear 10/22/2012 0404   LABSPEC 1.026 03/28/2021 0956   LABSPEC 1.035 10/22/2012 0404   PHURINE 5.0 03/28/2021 0956   GLUCOSEU 50 (A) 03/28/2021 0956   GLUCOSEU Negative 10/22/2012 0404   HGBUR NEGATIVE 03/28/2021 0956   BILIRUBINUR NEGATIVE 03/28/2021 0956   BILIRUBINUR Negative 10/22/2012 0404   KETONESUR NEGATIVE 03/28/2021 0956   PROTEINUR NEGATIVE 03/28/2021 0956   UROBILINOGEN 0.2 06/14/2011 1805   NITRITE NEGATIVE 03/28/2021 0956   LEUKOCYTESUR TRACE (A) 03/28/2021 0956   LEUKOCYTESUR Negative 10/22/2012 0404   Sepsis Labs: @LABRCNTIP (procalcitonin:4,lacticidven:4)  ) No results found for this or any previous visit (from the past 240 hour(s)).         Radiology Studies: No results found.      Scheduled Meds:  amoxicillin-clavulanate  1 tablet Oral Q12H   aspirin EC  81 mg Oral Daily   atorvastatin  10 mg Oral Daily   cholecalciferol  1,000 Units Oral Daily   clopidogrel  75 mg Oral Q breakfast   enoxaparin (LOVENOX) injection  40 mg Subcutaneous Q24H   feeding supplement  237 mL Oral BID BM   lamoTRIgine  25 mg Oral BID   melatonin  5 mg Oral QHS   multivitamin with minerals  1 tablet Oral Daily   OLANZapine  5 mg Oral QHS   polyethylene glycol  34 g Oral Daily   senna-docusate  2 tablet Oral BID   sertraline  100 mg Oral Daily   sodium chloride flush  3 mL Intravenous Q12H   traZODone  100 mg Oral QHS   Continuous Infusions:  sodium chloride Stopped (05/06/23 1505)     LOS: 10 days     Pennie Banter, DO [facility]   If 7PM-7AM, please contact night-coverage [email or web address] Password [facility] 05/08/2023, 6:32 PM

## 2023-05-08 NOTE — Progress Notes (Addendum)
  Subjective:  Patient ID: [name], female    DOB: [birth year],  MRN: [id number]  POD #2 left second toe amputation  Feeling well not having any major pain  Negative for chest pain and shortness of breath Fever: no Night sweats: no Constitutional signs: no Review of all other systems is negative Objective:   Vitals:   05/07/23 2322 05/08/23 0753  BP: 113/64 (!) 99/47  Pulse: 75 79  Resp: 20 16  Temp: 98.7 F (37.1 C) 98 F (36.7 C)  SpO2: 99% (!) 88%   General AA&O x3. Normal mood and affect.  Vascular Foot is warm and well-perfused  Neurologic Epicritic sensation grossly intact.  Dermatologic Dressing dry with minimal strikethrough.  Incision healing well no signs of necrosis or infection  Orthopedic: MMT 5/5 in dorsiflexion, plantarflexion, inversion, and eversion. Normal joint ROM without pain or crepitus.    Assessment & Plan:  Patient was evaluated and treated and all questions answered.  Left second toe infection status post amputation -WBAT in postop shoe -No dressing changes needed at discharge -Okay to discharge from our standpoint.  Would recommend 7 days p.o. antibiotics such as Augmentin at discharge -My office will arrange follow-up in 1 to 2 weeks for postop visit -We will sign off at this point please call if questions or concerns prior to discharge  Edwin Cap, DPM  Accessible via secure chat for questions or concerns.

## 2023-05-08 NOTE — Plan of Care (Signed)

## 2023-05-08 NOTE — Progress Notes (Signed)
Patient refused her scheduled medicine, she only agreed to take the PO antibiotics. The author educate and explained the importance of the medicine  but still refusing it. Dr. Arville Care was notified about the meds refusal.

## 2023-05-09 ENCOUNTER — Encounter: Payer: Self-pay | Admitting: Podiatry

## 2023-05-09 DIAGNOSIS — M869 Osteomyelitis, unspecified: Secondary | ICD-10-CM | POA: Diagnosis not present

## 2023-05-09 LAB — CBC
HCT: 30.6 % — ABNORMAL LOW (ref 36.0–46.0)
Hemoglobin: 9.5 g/dL — ABNORMAL LOW (ref 12.0–15.0)
MCH: 28.8 pg (ref 26.0–34.0)
MCHC: 31 g/dL (ref 30.0–36.0)
MCV: 92.7 fL (ref 80.0–100.0)
Platelets: 416 10*3/uL — ABNORMAL HIGH (ref 150–400)
RBC: 3.3 MIL/uL — ABNORMAL LOW (ref 3.87–5.11)
RDW: 12.8 % (ref 11.5–15.5)
WBC: 9.7 10*3/uL (ref 4.0–10.5)
nRBC: 0 % (ref 0.0–0.2)

## 2023-05-09 LAB — BASIC METABOLIC PANEL
Anion gap: 9 (ref 5–15)
BUN: 24 mg/dL — ABNORMAL HIGH (ref 8–23)
CO2: 23 mmol/L (ref 22–32)
Calcium: 8.7 mg/dL — ABNORMAL LOW (ref 8.9–10.3)
Chloride: 109 mmol/L (ref 98–111)
Creatinine, Ser: 0.59 mg/dL (ref 0.44–1.00)
GFR, Estimated: >60 mL/min (ref >60)
Glucose, Bld: 94 mg/dL (ref 70–99)
Potassium: 3.6 mmol/L (ref 3.5–5.1)
Sodium: 141 mmol/L (ref 135–145)

## 2023-05-09 LAB — TSH: TSH: 0.62 u[IU]/mL (ref 0.350–4.500)

## 2023-05-09 LAB — MAGNESIUM: Magnesium: 1.5 mg/dL — ABNORMAL LOW (ref 1.7–2.4)

## 2023-05-09 MED ORDER — MAGNESIUM SULFATE 2 GM/50ML IV SOLN
2.0000 g | Freq: Once | INTRAVENOUS | Status: AC
  Administered 2023-05-09: 2 g via INTRAVENOUS
  Filled 2023-05-09: qty 50

## 2023-05-09 NOTE — Evaluation (Signed)
Occupational Therapy Evaluation Patient Details Name: [name] MRN: [id number] DOB: [birth year] Today's Date: 05/09/2023   History of Present Illness [name] is a 82 y.o. female with medical history significant of Alzheimer's dementia who lives in assisted living facility comes in on account of left foot pain that has been going on for the last 4 days.  According to patient, she started noticing redness involving the left foot with associated swelling that have not been improving.  She denied history of diabetes, or any peripheral vascular disease that she knows of.  She denies cough nausea vomiting abdominal pain or chest pain. L 2nd toe amputation on 8/22. Increased risk for morbidity and mortality due to refusal to take medications. New LLE stent at risk for thrombosis without DAPT which pt refuses.   Clinical Impression   Pt was seen for OT/PT co-evaluation this date. Prior to hospital admission, pt was living at ALF, using wc for functional mobility and likely requiring assist for BADLs. Pt demos decreased willingness to participate which is potentially largest barrier to rehab at this time. Flat affect, reporting she "just wants to sleep". Multiple attempts made by OT/PT to perform sitting EOB with pt refusing (somewhat polietely). Pt functionally demos the ability to reach hands to touch hair and has poor grip strength upon assessment. She reports doing "nothing" at ALF, requires assist at baseline for ADLs and mobility. Fearful of falling. Anticipate significant time + encourgement to participate in all tasks. Will trial OT at this time to facilitate repositioning and ADL participation. Pt may benefit from skilled OT services to address noted impairments and functional limitations (see below for any additional details) in order to maximize safety and independence while minimizing falls risk and caregiver burden. Will trial OT at this time to work on ADL participation and maximize  therapeutic outcomes if able.       If plan is discharge home, recommend the following: A lot of help with walking and/or transfers;A lot of help with bathing/dressing/bathroom;Assistance with cooking/housework;Assistance with feeding;Direct supervision/assist for medications management;Direct supervision/assist for financial management;Assist for transportation;Supervision due to cognitive status    Functional Status Assessment  Patient has had a recent decline in their functional status and demonstrates the ability to make significant improvements in function in a reasonable and predictable amount of time.  Equipment Recommendations  None recommended by OT    Recommendations for Other Services       Precautions / Restrictions Precautions Precautions: Fall Restrictions Weight Bearing Restrictions: Yes LLE Weight Bearing: Weight bearing as tolerated      Mobility Bed Mobility Overal bed mobility: Needs Assistance Bed Mobility: Rolling Rolling: Contact guard assist         General bed mobility comments: With encourgement, pt rolls to check for cleanliness/repositioning CGA overall    Transfers                              ADL either performed or assessed with clinical judgement   ADL Overall ADL's : Needs assistance/impaired                                       General ADL Comments: Multiple attempts made by OT/PT to perform sitting EOB with pt refusing (somewhat polietely). Pt functionally demos the ability to reach hands to touch hair and has poor grip strength upon  assessment. At this time, pt's willingness to participate may be a larger barrier vs her true physical abilities. She reports doing "nothing" at ALF, requires assist at baseline for ADLs and mobility. Fearful of falling. Anticipate significant time + encourgement to participate in all tasks. Will trial OT at this time to facilitate repositioning and ADL participation.       Pertinent Vitals/Pain Pain Assessment Pain Assessment: 0-10 Pain Score: 10-Worst pain ever Pain Location: Foot Pain Descriptors / Indicators: Burning, Operative site guarding Pain Intervention(s): Limited activity within patient's tolerance     Extremity/Trunk Assessment Upper Extremity Assessment Upper Extremity Assessment: Generalized weakness   Lower Extremity Assessment Lower Extremity Assessment: Generalized weakness       Communication Communication Communication: No apparent difficulties   Cognition Arousal: Lethargic Behavior During Therapy: Flat affect Overall Cognitive Status: No family/caregiver present to determine baseline cognitive functioning                                 General Comments: Short responses to questions and uninterested. Minimal attempts to participate, refused to perform any EOB sitting or further mobility     General Comments  Unable to assess at this time due to limited Pt participation            Home Living Family/patient expects to be discharged to:: Assisted living                             Home Equipment: Wheelchair - manual;Grab bars - tub/shower;Grab bars - toilet   Additional Comments: Pt states she lives in facility +10 yrs      Prior Functioning/Environment Prior Level of Function : Needs assist             Mobility Comments: pt states she uses a wc for mobility, can transfer ADLs Comments: assist likely, pt inconsistent with answers        OT Problem List: Decreased strength;Decreased activity tolerance;Impaired balance (sitting and/or standing);Decreased cognition;Decreased safety awareness;Pain      OT Treatment/Interventions: Self-care/ADL training;Therapeutic exercise;Patient/family education;Balance training    OT Goals(Current goals can be found in the care plan section) Acute Rehab OT Goals OT Goal Formulation: Patient unable to participate in goal setting Time  For Goal Achievement: 05/23/23 Potential to Achieve Goals: Poor  OT Frequency: Min 1X/week    Co-evaluation PT/OT/SLP Co-Evaluation/Treatment: Yes Reason for Co-Treatment: Necessary to address cognition/behavior during functional activity;To address functional/ADL transfers PT goals addressed during session: Mobility/safety with mobility OT goals addressed during session: ADL's and self-care      AM-PAC OT "6 Clicks" Daily Activity     Outcome Measure Help from another person eating meals?: A Little Help from another person taking care of personal grooming?: A Little Help from another person toileting, which includes using toliet, bedpan, or urinal?: Total Help from another person bathing (including washing, rinsing, drying)?: Total Help from another person to put on and taking off regular upper body clothing?: A Lot Help from another person to put on and taking off regular lower body clothing?: A Lot 6 Click Score: 12   End of Session Equipment Utilized During Treatment: Oxygen (on 2L via [place]) Nurse Communication: Mobility status  Activity Tolerance: Other (comment) (limited by pt's willingness to participate) Patient left: in bed;with call bell/phone within reach;with bed alarm set  OT Visit Diagnosis: Other abnormalities of gait and mobility (R26.89);Muscle weakness (generalized) (  M62.81);Unsteadiness on feet (R26.81)                Time: 1884-1660 OT Time Calculation (min): 16 min Charges:  OT General Charges $OT Visit: 1 Visit OT Evaluation $OT Eval Low Complexity: 1 Low Duwane Gewirtz L. [place] Ponder, OTR/L  05/09/23, 1:56 PM

## 2023-05-09 NOTE — Progress Notes (Signed)
Progress Note    05/09/2023 9:40 AM 3 Days Post-Op  Subjective:  [name] is an 82 yo female with medical history significant of Alzheimer's dementia who lives in assisted living facility comes in on account of left foot pain. All her care is managed by [facility] DSS. She is now POD #5 from a left lower extremity angiogram with angioplasty and stent placement. She is also POD#3 from left second toe amputation by podiatry.   This morning she is resting and recovering from both her procedures. I informed her she will have to take her medications to prevent and thrombosis to the stents that have been placed. She verbalized her understanding but refuses to take any medications. When questioned as to why, she replied "I just don't want to".   Pt is increased risk for increased morbidity and mortality due to refusal to take medications. New LLE stent at risk for thrombosis without DAPT which pt refuses. Hospitalist team has contacted DSS of [place] and made them aware.    Vitals:   05/09/23 0052 05/09/23 0808  BP: (!) 104/47 (!) 102/42  Pulse: 67 63  Resp: 18 14  Temp: 98.1 F (36.7 C) 98.2 F (36.8 C)  SpO2: 94% 94%   Physical Exam: Cardiac:  normal S1/S2, RRR, no pedal edema.  Lungs:  Clear throughout on auscultation, Diminished in the bases, no rales, rhonchi or wheezing to note.  Incisions:  Right groin with dressing clean dry and intact. No hematoma or seroma to note.  Extremities:  Bilateral lower extremities are warm to touch and with capillary refill. Unable to palpate pulses. Left foot remains wrapped after toe amputation. I did not remove the dressing this morning. Abdomen:  Positive bowel Sounds throughout, soft, flat and non tender Neurologic: AAOX1 name only. Flat affect and follows commands. Refused to answer limited questions.   CBC    Component Value Date/Time   WBC 9.7 05/09/2023 0520   RBC 3.30 (L) 05/09/2023 0520   HGB 9.5 (L) 05/09/2023 0520    HGB 14.5 10/22/2012 0051   HCT 30.6 (L) 05/09/2023 0520   HCT 44.4 10/22/2012 0051   PLT 416 (H) 05/09/2023 0520   PLT 255 10/22/2012 0051   MCV 92.7 05/09/2023 0520   MCV 95 10/22/2012 0051   MCH 28.8 05/09/2023 0520   MCHC 31.0 05/09/2023 0520   RDW 12.8 05/09/2023 0520   RDW 13.3 10/22/2012 0051   LYMPHSABS 2.7 04/28/2023 1212   MONOABS 1.4 (H) 04/28/2023 1212   EOSABS 0.4 04/28/2023 1212   BASOSABS 0.0 04/28/2023 1212    BMET    Component Value Date/Time   NA 141 05/09/2023 0520   NA 143 10/22/2012 0051   K 3.6 05/09/2023 0520   K 3.7 10/22/2012 0051   CL 109 05/09/2023 0520   CL 109 (H) 10/22/2012 0051   CO2 23 05/09/2023 0520   CO2 26 10/22/2012 0051   GLUCOSE 94 05/09/2023 0520   GLUCOSE 129 (H) 10/22/2012 0051   BUN 24 (H) 05/09/2023 0520   BUN 33 (H) 10/22/2012 0051   CREATININE 0.59 05/09/2023 0520   CREATININE 0.66 10/22/2012 0051   CALCIUM 8.7 (L) 05/09/2023 0520   CALCIUM 9.7 10/22/2012 0051   GFRNONAA >60 05/09/2023 0520   GFRNONAA >60 10/22/2012 0051   GFRAA >60 02/29/2020 1134   GFRAA >60 10/22/2012 0051    INR No results found for: "INR"   Intake/Output Summary (Last 24 hours) at 05/09/2023 0940 Last data filed at 05/09/2023  5284 Gross per 24 hour  Intake 0 ml  Output 750 ml  Net -750 ml     Assessment/Plan:  82 y.o. female is s/p left lower extremity angiogram with intervention and left second toe amputation via podiatry.  3 Days Post-Op   PLAN: Encourage patient to take oral ASA 81 mg and Plavix 75 mg daily.  Continue PT/OT  Pan medication Sparingly and PRN.  Encourage to eat meals. Encourage to drink fluids.   DVT prophylaxis:  Lovenox 40 mg SQ Q24 hrs.    Marcie Bal [facility] 05/09/2023 9:40 AM

## 2023-05-09 NOTE — Evaluation (Signed)
Physical Therapy Evaluation Patient Details Name: [name] MRN: [id number] DOB: 09-30-1940 Today's Date: 05/09/2023  History of Present Illness  [name] is a 82 y.o. female with medical history significant of Alzheimer's dementia who lives in assisted living facility comes in on account of left foot pain that has been going on for the last 4 days.  According to patient, she started noticing redness involving the left foot with associated swelling that have not been improving.  She denied history of diabetes, or any peripheral vascular disease that she knows of.  She denies cough nausea vomiting abdominal pain or chest pain. L 2nd toe amputation on 8/22. Increased risk for morbidity and mortality due to refusal to take medications. New LLE stent at risk for thrombosis without DAPT which pt refuses.  Clinical Impression   Pt is seen by OT and PT for a co-evaluation today. Pt is received in bed, with min interest and flat affect but agreeable to session. Pt performs rolling bed mobility with CGA but not agreeable to perform further mobility due to generalized pain. Educated Pt on benefits behind changing position and encouraged participation in therapy but Pt was not receptive and reported she "just wants to sleep". Communicated with care team regarding discharge disposition and placing Pt on trial in hopes she can demonstrate consistent participation for skilled therapy. Pt would benefit from skilled PT to address above deficits and promote optimal return to PLOF.           If plan is discharge home, recommend the following: Two people to help with walking and/or transfers;Two people to help with bathing/dressing/bathroom;Supervision due to cognitive status   Can travel by private vehicle   No    Equipment Recommendations Other (comment) (TBD at this time)  Recommendations for Other Services       Functional Status Assessment Patient has had a recent decline in their functional  status and/or demonstrates limited ability to make significant improvements in function in a reasonable and predictable amount of time     Precautions / Restrictions Precautions Precautions: Fall Restrictions Weight Bearing Restrictions: Yes LLE Weight Bearing: Weight bearing as tolerated      Mobility  Bed Mobility Overal bed mobility: Needs Assistance Bed Mobility: Rolling Rolling: Contact guard assist         General bed mobility comments: able to perform rolling bed mobility CGA    Transfers                   General transfer comment: NT due to lack of participation from Pt    Ambulation/Gait               General Gait Details: Not performed due to Pt amb with w/c  Stairs            Wheelchair Mobility     Tilt Bed    Modified Rankin (Stroke Patients Only)       Balance                                             Pertinent Vitals/Pain Pain Assessment Pain Assessment: 0-10 Pain Score: 10-Worst pain ever Pain Location: L foot Pain Descriptors / Indicators: Burning, Operative site guarding Pain Intervention(s): Limited activity within patient's tolerance    Home Living Family/patient expects to be discharged to:: Assisted living  Home Equipment: Wheelchair - manual;Grab bars - tub/shower;Grab bars - toilet Additional Comments: Pt states she lives in facility +10 yrs    Prior Function Prior Level of Function : Needs assist             Mobility Comments: pt states she uses a wc for mobility, can transfer ADLs Comments: assist likely, pt inconsistent with answers     Extremity/Trunk Assessment   Upper Extremity Assessment Upper Extremity Assessment: Generalized weakness    Lower Extremity Assessment Lower Extremity Assessment: Generalized weakness       Communication   Communication Communication: No apparent difficulties  Cognition Arousal: Lethargic Behavior During  Therapy: Flat affect Overall Cognitive Status: No family/caregiver present to determine baseline cognitive functioning                                 General Comments: Short responses to questions and uninterested        General Comments General comments (skin integrity, edema, etc.): Unable to assess at this time due to limited Pt participation    Exercises     Assessment/Plan    PT Assessment Patient needs continued PT services (Will place Pt on PT trial in hopes she will increase participation)  PT Problem List Decreased strength;Decreased mobility;Decreased activity tolerance;Decreased balance;Decreased cognition;Pain       PT Treatment Interventions Wheelchair mobility training;Functional mobility training;Therapeutic activities;Balance training    PT Goals (Current goals can be found in the Care Plan section)  Acute Rehab PT Goals Patient Stated Goal: unable to state PT Goal Formulation: Patient unable to participate in goal setting Time For Goal Achievement: 05/23/23 Potential to Achieve Goals: Poor    Frequency Min 1X/week     Co-evaluation PT/OT/SLP Co-Evaluation/Treatment: Yes Reason for Co-Treatment: Necessary to address cognition/behavior during functional activity;To address functional/ADL transfers PT goals addressed during session: Mobility/safety with mobility OT goals addressed during session: ADL's and self-care       AM-PAC PT "6 Clicks" Mobility  Outcome Measure Help needed turning from your back to your side while in a flat bed without using bedrails?: A Little Help needed moving from lying on your back to sitting on the side of a flat bed without using bedrails?: Total Help needed moving to and from a bed to a chair (including a wheelchair)?: Total Help needed standing up from a chair using your arms (e.g., wheelchair or bedside chair)?: Total Help needed to walk in hospital room?: Total Help needed climbing 3-5 steps with a  railing? : Total 6 Click Score: 8    End of Session   Activity Tolerance: Other (comment) (Treatment limited secondary to lack of participation) Patient left: in bed;with chair alarm set;with call bell/phone within reach Nurse Communication: Mobility status PT Visit Diagnosis: Unsteadiness on feet (R26.81);Pain Pain - Right/Left: Left Pain - part of body: Ankle and joints of foot    Time: 0922-0939 PT Time Calculation (min) (ACUTE ONLY): 17 min   Charges:                 Elmon Else, SPT   Malachy Coleman 05/09/2023, 12:15 PM

## 2023-05-09 NOTE — Progress Notes (Signed)
PROGRESS NOTE    [name]  WJX:[id number] DOB: 04-30-1941 DOA: 04/28/2023 PCP: Almetta Lovely, [facility]  Outpatient Specialists: none    Brief Narrative:   "[name] is a 82 y.o. female with medical history significant of Alzheimer's dementia who lives in assisted living facility comes in on account of left foot pain that has been going on for the last 4 days.  According to patient she started noticing redness involving the left foot with associated swelling that have not been improving.  She denied history of diabetes, or any peripheral vascular disease that she knows of.  She denies cough nausea vomiting abdominal pain or chest pain "  Further hospital course and management as outlined below. In summary, patient underwent LLE angiogram with intervention for PAD by vascular surgery on 8/21, followed by left 2nd toe amputation by podiatry on 8/22.  Treated initially with empiric IV antibiotics, transitioned to PO Augmentin after surgery.  Complication post-op factor has been patient refusal of medications and most basic patient care by nursing staff.  Prognosis guarded Pt is increased risk for increased morbidity and mortality due to refusal to take medications.  New LLE stent at risk for thrombosis without DAPT which pt refuses.  At risk for dehydration and electrolyte derangements with poor PO intake.  At risk for skin breakdown due to refusal to reposition in the bed.  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 Lactate has normalized. - podiatry consulted, plan for amputation today 8/22 - vascular surgery following - angiogram 8/21 - with  stent placed to L superficial femoral and popliteal arteries, thrombectomy of left SFA - treated with empiric IV ceftriaxone/vanc/flagyl - transition to PO Augmentin x 7 days per podiatry  # PAD Vascular consulted, needs angiogram but this was initially on hold pending DSS authorization. After I complained to his DSS caseworker on 8/19 things were escalated to the supervisor and consent to proceed w/ angiogram was given on 8/19. Angiogram completed 8/21 as above - mgmt per vascular surgery - cont ASA/plavix  # Refusing care -- pt refusing meds since 8/24, including her psychiatric medications and DAPT (has new stent in LLE).  Also resistant to repositioning in bed and other nursing care tasks.  Little PO intake. - will get psychiatry's input - continue to encourage pt to take meds, reposition, etc - check AM labs, monitor for signs of dehydration - palliative care consult if persistent  #Hypomagnesemia Mg 1.5 this AM - IV replacement ordered Monitor & replace PRN  # Bipolar disorder - cont home olanzapine, sertraline, trazodone, lamotrigine, clonazepam  # Dementia No behavioral disturbance. Resides at SNF Delirium precautions  # Guardianship - DSS guardian   DVT prophylaxis: lovenox Code Status: dnr Family Communication: none @ bedside  Level of care: Med-Surg Status is: Inpatient Remains inpatient appropriate because: stable for d/c back to SNF when facility can accept    Consultants:  Vascular Podiatry Psychiatry Palliative Care  Procedures: As above - angiogram, left 2nd toe amputation   Antimicrobials:  See above    Subjective: Pt was resting in bed awake when seen. RN at bedside reports pt  had just let them reposition her.  She refused this past couple of days.  Pt still refusing meds.  Again advised patient strongly of importance of ASA/Plavis to keep her new leg stent open. She agrees to take them, but RN reported pt ultimately refused them when she  brought them to her.  Objective: Vitals:   05/08/23 1602 05/09/23 0052 05/09/23 0808 05/09/23 1425  BP: (!) 98/50 (!) 104/47 (!) 102/42 (!) 113/42  Pulse: 73 67 63 60  Resp: 16 18 14 14   Temp: 99.1 F (37.3 C) 98.1 F (36.7 C) 98.2 F (36.8 C) 97.7 F (36.5 C)  TempSrc:      SpO2: 98% 94% 94% 100%  Weight:      Height:        Intake/Output Summary (Last 24 hours) at 05/09/2023 1926 Last data filed at 05/09/2023 1914 Gross per 24 hour  Intake 580 ml  Output 550 ml  Net 30 ml   Filed Weights   04/28/23 1206  Weight: 54.4 kg    Examination:  General exam: awake resting in bed, no acute distress, minimally interactive HEENT: moist mucus membranes, hearing grossly normal  Respiratory system: CTAB generally diminished, no wheezes, rales or rhonchi, normal respiratory effort. Cardiovascular system: normal S1/S2, RRR, no pedal edema.   Gastrointestinal system: soft, NT, ND Central nervous system: normal speech, grossly non-focal, exam limited by pt refusal to participate Extremities: left foot dressing in place, no edema, normal tone Skin: dry, intact, normal temperature Psychiatry: withdrawn, flat affect, no apparent hallucinations, unclear judgment and insight due to minimal interaction, answers questions appropriately    Data Reviewed: I have personally reviewed following labs and imaging studies  CBC: Recent Labs  Lab 05/05/23 0519 05/07/23 0510 05/08/23 0510 05/09/23 0520  WBC 10.1 12.0* 10.0 9.7  HGB 10.6* 9.6* 8.7* 9.5*  HCT 33.9* 31.0* 28.5* 30.6*  MCV 91.4 92.5 93.1 92.7  PLT 384 328 381 416*   Basic Metabolic Panel: Recent Labs  Lab 05/05/23 0519 05/07/23 0510 05/09/23 0520  NA  --  140 141  K  --  3.8 3.6  CL  --  108 109  CO2  --  23 23  GLUCOSE  --  94 94  BUN  --  15 24*  CREATININE 0.58 0.58 0.59  CALCIUM  --  8.6* 8.7*  MG  --   --  1.5*   GFR: Estimated Creatinine Clearance: 45.8 mL/min (by C-G formula based on SCr of 0.59  mg/dL). Liver Function Tests: No results for input(s): "AST", "ALT", "ALKPHOS", "BILITOT", "PROT", "ALBUMIN" in the last 168 hours.  No results for input(s): "LIPASE", "AMYLASE" in the last 168 hours. No results for input(s): "AMMONIA" in the last 168 hours. Coagulation Profile: No results for input(s): "INR", "PROTIME" in the last 168 hours. Cardiac Enzymes: No results for input(s): "CKTOTAL", "CKMB", "CKMBINDEX", "TROPONINI" in the last 168 hours. BNP (last 3 results) No results for input(s): "PROBNP" in the last 8760 hours. HbA1C: No results for input(s): "HGBA1C" in the last 72 hours.  CBG: No results for input(s): "GLUCAP" in the last 168 hours. Lipid Profile: No results for input(s): "CHOL", "HDL", "LDLCALC", "TRIG", "CHOLHDL", "LDLDIRECT" in the last 72 hours. Thyroid Function Tests: Recent Labs    05/09/23 0520  TSH 0.620   Anemia Panel: Recent Labs    05/07/23 0509 05/08/23 0952  VITAMINB12 256  --   FOLATE  --  30.0  FERRITIN  --  59  TIBC  --  244*  IRON  --  16*   Urine analysis:    Component Value Date/Time   COLORURINE YELLOW (A) 03/28/2021 0956   APPEARANCEUR CLEAR (A) 03/28/2021 0956   APPEARANCEUR Clear 10/22/2012 0404   LABSPEC 1.026 03/28/2021 0956   LABSPEC 1.035 10/22/2012 0404   PHURINE 5.0 03/28/2021 0956   GLUCOSEU 50 (A) 03/28/2021 0956   GLUCOSEU Negative 10/22/2012 0404   HGBUR NEGATIVE 03/28/2021 0956   BILIRUBINUR NEGATIVE 03/28/2021 0956   BILIRUBINUR Negative 10/22/2012 0404   KETONESUR NEGATIVE 03/28/2021 0956   PROTEINUR NEGATIVE 03/28/2021 0956   UROBILINOGEN 0.2 06/14/2011 1805   NITRITE NEGATIVE 03/28/2021 0956   LEUKOCYTESUR TRACE (A) 03/28/2021 0956   LEUKOCYTESUR Negative 10/22/2012 0404   Sepsis Labs: @LABRCNTIP (procalcitonin:4,lacticidven:4)  ) No results found for this or any previous visit (from the past 240 hour(s)).        Radiology Studies: No results found.      Scheduled Meds:   amoxicillin-clavulanate  1 tablet Oral Q12H   aspirin EC  81 mg Oral Daily   atorvastatin  10 mg Oral Daily   cholecalciferol  1,000 Units Oral Daily   clopidogrel  75 mg Oral Q breakfast   enoxaparin (LOVENOX) injection  40 mg Subcutaneous Q24H   feeding supplement  237 mL Oral BID BM   lamoTRIgine  25 mg Oral BID   melatonin  5 mg Oral QHS   multivitamin with minerals  1 tablet Oral Daily   OLANZapine  5 mg Oral QHS   polyethylene glycol  34 g Oral Daily   senna-docusate  2 tablet Oral BID   sertraline  100 mg Oral Daily   sodium chloride flush  3 mL Intravenous Q12H   traZODone  100 mg Oral QHS   Continuous Infusions:  sodium chloride Stopped (05/06/23 1505)     LOS: 11 days     Pennie Banter, DO [facility]   If 7PM-7AM, please contact night-coverage [email or web address] Password [facility] 05/09/2023, 7:26 PM

## 2023-05-09 NOTE — TOC Progression Note (Signed)
Transition of Care [facility]) - Progression Note    Patient Details  Name: [name] MRN: [id number] Date of Birth: September 13, 1941  [facility] of [facility]) CM/SW Contact  Marlowe Sax, RN Phone Number: 05/09/2023, 2:19 PM  Clinical Narrative:    [facility] to let them know the patient is not rehab appropriate and will need to return, Left a secure VM for the Director Efraim Kaufmann, I Called Scottie at [facility] and they said that he is only the salesman and can not help   Expected Discharge Plan: Assisted Living Barriers to Discharge: Continued Medical Work up  Expected Discharge Plan and Services       Living arrangements for the past 2 months: Assisted Living Facility                                       Social Determinants of Health (SDOH) Interventions SDOH Screenings   Tobacco Use: Medium Risk (05/05/2023)    Readmission Risk Interventions    12/25/2020    3:26 PM  Readmission Risk Prevention Plan  Transportation Screening Complete  PCP or Specialist Appt within 3-5 Days Complete  HRI or Home Care Consult Complete  Social Work Consult for Recovery Care Planning/Counseling Complete  Palliative Care Screening Complete  Medication Review Oceanographer) Complete

## 2023-05-09 NOTE — Plan of Care (Signed)
  Problem: Health Behavior/Discharge Planning: Goal: Ability to manage health-related needs will improve Outcome: Progressing   Problem: Clinical Measurements: Goal: Diagnostic test results will improve Outcome: Progressing   Problem: Activity: Goal: Risk for activity intolerance will decrease Outcome: Progressing   Problem: Nutrition: Goal: Adequate nutrition will be maintained Outcome: Progressing   Problem: Elimination: Goal: Will not experience complications related to bowel motility Outcome: Progressing Goal: Will not experience complications related to urinary retention Outcome: Progressing   Problem: Pain Managment: Goal: General experience of comfort will improve Outcome: Progressing   

## 2023-05-10 DIAGNOSIS — Z7189 Other specified counseling: Secondary | ICD-10-CM

## 2023-05-10 DIAGNOSIS — M869 Osteomyelitis, unspecified: Secondary | ICD-10-CM | POA: Diagnosis not present

## 2023-05-10 LAB — BASIC METABOLIC PANEL
Anion gap: 10 (ref 5–15)
BUN: 22 mg/dL (ref 8–23)
CO2: 23 mmol/L (ref 22–32)
Calcium: 8.8 mg/dL — ABNORMAL LOW (ref 8.9–10.3)
Chloride: 107 mmol/L (ref 98–111)
Creatinine, Ser: 0.47 mg/dL (ref 0.44–1.00)
GFR, Estimated: >60 mL/min (ref >60)
Glucose, Bld: 100 mg/dL — ABNORMAL HIGH (ref 70–99)
Potassium: 3.6 mmol/L (ref 3.5–5.1)
Sodium: 140 mmol/L (ref 135–145)

## 2023-05-10 LAB — MAGNESIUM: Magnesium: 1.7 mg/dL (ref 1.7–2.4)

## 2023-05-10 LAB — PHOSPHORUS: Phosphorus: 2.3 mg/dL — ABNORMAL LOW (ref 2.5–4.6)

## 2023-05-10 MED ORDER — FERROUS SULFATE 325 (65 FE) MG PO TABS: 325.0000 mg | ORAL_TABLET | Freq: Every day | ORAL | Status: DC

## 2023-05-10 MED ORDER — FERROUS SULFATE 325 (65 FE) MG PO TABS: 325.0000 mg | ORAL_TABLET | Freq: Every day | ORAL | Status: AC

## 2023-05-10 MED ORDER — ATORVASTATIN CALCIUM 10 MG PO TABS: 10.0000 mg | ORAL_TABLET | Freq: Every day | ORAL | Status: DC

## 2023-05-10 MED ORDER — CLOPIDOGREL BISULFATE 75 MG PO TABS: 75.0000 mg | ORAL_TABLET | Freq: Every day | ORAL | Status: DC

## 2023-05-10 MED ORDER — POLYETHYLENE GLYCOL 3350 17 G PO PACK: 34.0000 g | PACK | Freq: Every day | ORAL | Status: AC

## 2023-05-10 MED ORDER — AMOXICILLIN-POT CLAVULANATE 875-125 MG PO TABS: 1.0000 | ORAL_TABLET | Freq: Two times a day (BID) | ORAL | Status: AC

## 2023-05-10 MED ORDER — ASPIRIN 81 MG PO TBEC: 81.0000 mg | DELAYED_RELEASE_TABLET | Freq: Every day | ORAL | Status: AC

## 2023-05-10 MED ORDER — AMOXICILLIN-POT CLAVULANATE 875-125 MG PO TABS: 1.0000 | ORAL_TABLET | Freq: Two times a day (BID) | ORAL | Status: DC

## 2023-05-10 MED ORDER — OXYCODONE HCL 5 MG PO TABS: 5.0000 mg | ORAL_TABLET | ORAL | 0 refills | Status: DC | PRN

## 2023-05-10 NOTE — TOC Progression Note (Signed)
Transition of Care [facility]) - Progression Note    Patient Details  Name: [name] MRN: [id number] Date of Birth: 10-Jan-1941  [facility]) CM/SW Contact  Marlowe Sax, RN Phone Number: 05/10/2023, 2:51 PM  Clinical Narrative:     Bertrum Sol at DSS, legal Guardian. She will go to room 505 to [place] [facility], EMS to transport EMS called  to arrange transport, the nurse to call report, DC summary email to guardian at [email or web address]   Expected Discharge Plan: Assisted Living Barriers to Discharge: Continued Medical Work up  Expected Discharge Plan and Services       Living arrangements for the past 2 months: Assisted Living Facility Expected Discharge Date: 05/10/23                                     Social Determinants of Health (SDOH) Interventions SDOH Screenings   Tobacco Use: Medium Risk (05/05/2023)    Readmission Risk Interventions    12/25/2020    3:26 PM  Readmission Risk Prevention Plan  Transportation Screening Complete  PCP or Specialist Appt within 3-5 Days Complete  HRI or Home Care Consult Complete  Social Work Consult for Recovery Care Planning/Counseling Complete  Palliative Care Screening Complete  Medication Review Oceanographer) Complete

## 2023-05-10 NOTE — Progress Notes (Addendum)
PROGRESS NOTE    [name]  WGN:[id number] DOB: [birth year] DOA: 04/28/2023 PCP: Almetta Lovely, [facility]  Outpatient Specialists: none    Brief Narrative:   "[name] is a 82 y.o. female with medical history significant of Alzheimer's dementia who lives in assisted living facility comes in on account of left foot pain that has been going on for the last 4 days.  According to patient she started noticing redness involving the left foot with associated swelling that have not been improving.  She denied history of diabetes, or any peripheral vascular disease that she knows of.  She denies cough nausea vomiting abdominal pain or chest pain "  Further hospital course and management as outlined below. In summary, patient underwent LLE angiogram with intervention for PAD by vascular surgery on 8/21, followed by left 2nd toe amputation by podiatry on 8/22.  Treated initially with empiric IV antibiotics, transitioned to PO Augmentin after surgery.  Complication post-op factor has been patient refusal of medications and most basic patient care by nursing staff.  This improved as of 8/27 AM.  Prognosis guarded Pt is increased risk for increased morbidity and mortality due to refusal to take medications.  New LLE stent at risk for thrombosis without DAPT which pt refuses.  At risk for dehydration and electrolyte derangements with poor PO intake.  At risk for skin breakdown due to refusal to reposition in the bed.   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 Lactate has normalized. - Vascular surgery following - angiogram 8/21 - with stent placed to L  superficial femoral and popliteal arteries, thrombectomy of left SFA - Podiatry consulted -- pt underwent left 2nd toe amputation 8/22 - treated with empiric IV ceftriaxone/vanc/flagyl - transitioned to PO Augmentin x 7 days per podiatry   # PAD Vascular consulted, needs angiogram but this was initially on hold pending DSS authorization. After I complained to his DSS caseworker on 8/19 things were escalated to the supervisor and consent to proceed w/ angiogram was given on 8/19. Angiogram completed 8/21 as above - mgmt per vascular surgery - cont ASA/plavix --pt has been counseled multiple times on importance of ASA/plavix to keep her stent open, risk of severe complications if stent gets thrombosed  # Refusing care -- pt refusing meds since 8/24, including her psychiatric medications and DAPT (has new stent in LLE).  Also resistant to repositioning in bed and other nursing care tasks.  Little PO intake. 8/27 - improved, took AM meds, ate some breakfast, more interactive - will get psychiatry's input - continue to encourage pt to take meds, reposition, etc - labs stable, not showing dehydration - palliative care consulted - appreciate assistance in GOC discussions  #Hypomagnesemia Mg 1.5 on 8/26 was replaced Monitor & replace PRN  # Iron deficiency anemia -- Hbg stable. - hold of iron infusion given infection - started PO iron  # Bipolar disorder - cont home olanzapine, sertraline, trazodone, lamotrigine, clonazepam  # Dementia No behavioral disturbance. Resides at [facility] Delirium precautions  # Guardianship - DSS guardian   DVT prophylaxis: lovenox Code Status: dnr Family Communication: none @ bedside  Level of care: Med-Surg Status is: Inpatient Remains inpatient appropriate because: stable for d/c back to SNF when facility can accept    Consultants:  Vascular Podiatry Psychiatry Palliative Care  Procedures: LLE Angiogram with stent placement Left 2nd  toe amputation   Antimicrobials:  See above    Subjective: Pt seen with RN at bedside preparing to give her medications.  Palliative had just been in to see her.  Patient is agreeable to taking medications today, declined the miralax.  She had been refusing all meds for past 4 days.  Pt reports feeling fine and denies any complaints including pain.  Objective: Vitals:   05/09/23 0808 05/09/23 1425 05/09/23 2320 05/10/23 0735  BP: (!) 102/42 (!) 113/42 (!) 105/47 (!) 120/51  Pulse: 63 60 (!) 58 66  Resp: 14 14 16 14   Temp: 98.2 F (36.8 C) 97.7 F (36.5 C) 97.6 F (36.4 C) 98.1 F (36.7 C)  TempSrc:      SpO2: 94% 100% 98% 91%  Weight:      Height:        Intake/Output Summary (Last 24 hours) at 05/10/2023 1321 Last data filed at 05/10/2023 0500 Gross per 24 hour  Intake 580 ml  Output 300 ml  Net 280 ml   Filed Weights   04/28/23 1206  Weight: 54.4 kg    Examination:  General exam: awake sitting up in bed, no acute distress, more talkative and interactive today HEENT: moist mucus membranes, hearing grossly normal, poor dentition  Respiratory system: on room air, normal respiratory effort. Cardiovascular system: RRR, no LE edema.   Gastrointestinal system: soft, NT, ND Central nervous system: normal speech, grossly non-focal, A&O Extremities: left foot dressing in place with intact sensation distally and normal temp Skin: dry, intact, normal temperature Psychiatry: normal mood, flat affect, judgment and insight appear intact    Data Reviewed: I have personally reviewed following labs and imaging studies  CBC: Recent Labs  Lab 05/05/23 0519 05/07/23 0510 05/08/23 0510 05/09/23 0520  WBC 10.1 12.0* 10.0 9.7  HGB 10.6* 9.6* 8.7* 9.5*  HCT 33.9* 31.0* 28.5* 30.6*  MCV 91.4 92.5 93.1 92.7  PLT 384 328 381 416*   Basic Metabolic Panel: Recent Labs  Lab 05/05/23 0519 05/07/23 0510 05/09/23 0520 05/10/23 0441  NA  --  140 141 140  K  --  3.8 3.6 3.6   CL  --  108 109 107  CO2  --  23 23 23   GLUCOSE  --  94 94 100*  BUN  --  15 24* 22  CREATININE 0.58 0.58 0.59 0.47  CALCIUM  --  8.6* 8.7* 8.8*  MG  --   --  1.5* 1.7  PHOS  --   --   --  2.3*   GFR: Estimated Creatinine Clearance: 45.8 mL/min (by C-G formula based on SCr of 0.47 mg/dL). Liver Function Tests: No results for input(s): "AST", "ALT", "ALKPHOS", "BILITOT", "PROT", "ALBUMIN" in the last 168 hours.  No results for input(s): "LIPASE", "AMYLASE" in the last 168 hours. No results for input(s): "AMMONIA" in the last 168 hours. Coagulation Profile: No results for input(s): "INR", "PROTIME" in the last 168 hours. Cardiac Enzymes: No results for input(s): "CKTOTAL", "CKMB", "CKMBINDEX", "TROPONINI" in the last 168 hours. BNP (last 3 results) No results for input(s): "PROBNP" in the last 8760 hours. HbA1C: No results for input(s): "HGBA1C" in the last 72 hours.  CBG: No results for input(s): "GLUCAP" in the last 168 hours. Lipid Profile:  No results for input(s): "CHOL", "HDL", "LDLCALC", "TRIG", "CHOLHDL", "LDLDIRECT" in the last 72 hours. Thyroid Function Tests: Recent Labs    05/09/23 0520  TSH 0.620   Anemia Panel: Recent Labs    05/08/23 0952  FOLATE 30.0  FERRITIN 59  TIBC 244*  IRON 16*   Urine analysis:    Component Value Date/Time   COLORURINE YELLOW (A) 03/28/2021 0956   APPEARANCEUR CLEAR (A) 03/28/2021 0956   APPEARANCEUR Clear 10/22/2012 0404   LABSPEC 1.026 03/28/2021 0956   LABSPEC 1.035 10/22/2012 0404   PHURINE 5.0 03/28/2021 0956   GLUCOSEU 50 (A) 03/28/2021 0956   GLUCOSEU Negative 10/22/2012 0404   HGBUR NEGATIVE 03/28/2021 0956   BILIRUBINUR NEGATIVE 03/28/2021 0956   BILIRUBINUR Negative 10/22/2012 0404   KETONESUR NEGATIVE 03/28/2021 0956   PROTEINUR NEGATIVE 03/28/2021 0956   UROBILINOGEN 0.2 06/14/2011 1805   NITRITE NEGATIVE 03/28/2021 0956   LEUKOCYTESUR TRACE (A) 03/28/2021 0956   LEUKOCYTESUR Negative 10/22/2012 0404    Sepsis Labs: @LABRCNTIP (procalcitonin:4,lacticidven:4)  ) No results found for this or any previous visit (from the past 240 hour(s)).        Radiology Studies: No results found.      Scheduled Meds:  amoxicillin-clavulanate  1 tablet Oral Q12H   aspirin EC  81 mg Oral Daily   atorvastatin  10 mg Oral Daily   cholecalciferol  1,000 Units Oral Daily   clopidogrel  75 mg Oral Q breakfast   enoxaparin (LOVENOX) injection  40 mg Subcutaneous Q24H   feeding supplement  237 mL Oral BID BM   lamoTRIgine  25 mg Oral BID   melatonin  5 mg Oral QHS   multivitamin with minerals  1 tablet Oral Daily   OLANZapine  5 mg Oral QHS   polyethylene glycol  34 g Oral Daily   senna-docusate  2 tablet Oral BID   sertraline  100 mg Oral Daily   sodium chloride flush  3 mL Intravenous Q12H   traZODone  100 mg Oral QHS   Continuous Infusions:  sodium chloride Stopped (05/06/23 1505)     LOS: 12 days     Pennie Banter, DO [facility]   If 7PM-7AM, please contact night-coverage [email or web address] Password [email or web address] 05/10/2023, 1:21 PM

## 2023-05-10 NOTE — TOC Progression Note (Addendum)
Transition of Care [facility]) - Progression Note    Patient Details  Name: [name] MRN: [id number] Date of Birth: 1941/07/27  Transition [facility]) CM/SW Contact  Marlowe Sax, RN Phone Number: 05/10/2023, 2:07 PM  Clinical Narrative:   [facility] is stating that the patient will need to go to STR as they can not do wound care, The patient's legal guardian called and provided with the bed choice for STR they chose [facility], her name is [name] her number is [phone] Information provided to [place] [place]  Expected Discharge Plan: Assisted Living Barriers to Discharge: Continued Medical Work up  Expected Discharge Plan and Services       Living arrangements for the past 2 months: Assisted Living Facility                                       Social Determinants of Health (SDOH) Interventions SDOH Screenings   Tobacco Use: Medium Risk (05/05/2023)    Readmission Risk Interventions    12/25/2020    3:26 PM  Readmission Risk Prevention Plan  Transportation Screening Complete  PCP or Specialist Appt within 3-5 Days Complete  HRI or Home Care Consult Complete  Social Work Consult for Recovery Care Planning/Counseling Complete  Palliative Care Screening Complete  Medication Review [facility]) Complete

## 2023-05-10 NOTE — Consult Note (Signed)
[facility] Face-to-Face Psychiatry Consult   Reason for Consult:  Psychiatric evaluation due to refusing medications/care Referring Physician:  Esaw Grandchild DO Patient Identification: [name] MRN:  [id number] Principal Diagnosis: 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) Diagnosis:  Principal Problem:   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) Active Problems:   Bipolar disorder (HCC)   Dementia with behavioral disturbance (HCC)   PAD (peripheral artery disease) (HCC)   Cellulitis   Total Time spent with patient: 15 minutes  Subjective:   [name] is a 82 y.o. female patient seen due to refusing medications/care. Patient reports medication compliance, stating "I thought I didn't need my medications but I know that I do need them now".  HPI:  [name] is a 82 yo female seen after refusing medications and care. Patient has a psychiatric history of dementia with behavioral disturbance, bipolar disorder, and insomnia. She reports being prescribed several medications which made her reluctant to comply earlier during her hospitalization. Patient reports current medication compliance and compliance with care. She is alert and oriented x 4, calm and willing to engage. Patient reports plans to return to her assisted living community and adhere to medications/treatment. She denies SI/HI/AVH/delusional thought.  Past Psychiatric History: see above  Risk to Self:  denies Risk to Others:  denies Prior Inpatient Therapy:  denies Prior Outpatient Therapy:  ALF living w/treatment  Past Medical History:  Past Medical History:  Diagnosis Date   Alzheimer's dementia (HCC)    Insomnia    Low back pain    Vomiting     Past Surgical History:  Procedure Laterality Date   AMPUTATION TOE Left 05/06/2023   Procedure: AMPUTATION TOE 2nd toe;  Surgeon: Edwin Cap, DPM;  Location: [facility] ORS;  Service: Orthopedics/Podiatry;  Laterality: Left;   HIP ARTHROPLASTY Left 09/22/2019    Procedure: ARTHROPLASTY BIPOLAR HIP (HEMIARTHROPLASTY) RIGHT;  Surgeon: Christena Flake, MD;  Location: [facility] ORS;  Service: Orthopedics;  Laterality: Left;   LOWER EXTREMITY INTERVENTION Left 05/04/2023   Procedure: LOWER EXTREMITY INTERVENTION;  Surgeon: Renford Dills, MD;  Location: [facility] INVASIVE CV LAB;  Service: Cardiovascular;  Laterality: Left;   VAGINAL HYSTERECTOMY     Family History: No family history on file. Family Psychiatric  History: none known Social History:  Social History   Substance and Sexual Activity  Alcohol Use Not Currently     Social History   Substance and Sexual Activity  Drug Use Not Currently    Social History   Socioeconomic History   Marital status: Divorced    Spouse name: Not on file   Number of children: Not on file   Years of education: Not on file   Highest education level: Not on file  Occupational History   Not on file  Tobacco Use   Smoking status: Former   Smokeless tobacco: Never  Substance and Sexual Activity   Alcohol use: Not Currently   Drug use: Not Currently   Sexual activity: Not on file  Other Topics Concern   Not on file  Social History Narrative   Not on file   Social Determinants of Health   Financial Resource Strain: Not on file  Food Insecurity: Not on file  Transportation Needs: Not on file  Physical Activity: Not on file  Stress: Not on file  Social Connections: Not on file   Additional Social History:    Allergies:  No Known Allergies  Labs:  Results for orders placed or performed  during the hospital encounter of 04/28/23 (from the past 48 hour(s))  Basic metabolic panel     Status: Abnormal   Collection Time: 05/09/23  5:20 AM  Result Value Ref Range   Sodium 141 135 - 145 mmol/L   Potassium 3.6 3.5 - 5.1 mmol/L   Chloride 109 98 - 111 mmol/L   CO2 23 22 - 32 mmol/L   Glucose, Bld 94 70 - 99 mg/dL    Comment: Glucose reference range applies only to samples taken after fasting for at least 8  hours.   BUN 24 (H) 8 - 23 mg/dL   Creatinine, Ser 1.61 0.44 - 1.00 mg/dL   Calcium 8.7 (L) 8.9 - 10.3 mg/dL   GFR, Estimated >09 >60 mL/min    Comment: (NOTE) Calculated using the CKD-EPI Creatinine Equation (2021)    Anion gap 9 5 - 15    Comment: Performed at [facility], [address] [place], [place] [address]  Magnesium     Status: Abnormal   Collection Time: 05/09/23  5:20 AM  Result Value Ref Range   Magnesium 1.5 (L) 1.7 - 2.4 mg/dL    Comment: Performed at [facility], [address] [place], [place] [address]  TSH     Status: None   Collection Time: 05/09/23  5:20 AM  Result Value Ref Range   TSH 0.620 0.350 - 4.500 uIU/mL    Comment: Performed by a 3rd Generation assay with a functional sensitivity of <=0.01 uIU/mL. Performed at [facility], [address] [place], [place] [address]   CBC     Status: Abnormal   Collection Time: 05/09/23  5:20 AM  Result Value Ref Range   WBC 9.7 4.0 - 10.5 K/uL   RBC 3.30 (L) 3.87 - 5.11 MIL/uL   Hemoglobin 9.5 (L) 12.0 - 15.0 g/dL   HCT 56.2 (L) 13.0 - 86.5 %   MCV 92.7 80.0 - 100.0 fL   MCH 28.8 26.0 - 34.0 pg   MCHC 31.0 30.0 - 36.0 g/dL   RDW 78.4 69.6 - 29.5 %   Platelets 416 (H) 150 - 400 K/uL   nRBC 0.0 0.0 - 0.2 %    Comment: Performed at [facility], [address] [place], [place] [address]  Basic metabolic panel     Status: Abnormal   Collection Time: 05/10/23  4:41 AM  Result Value Ref Range   Sodium 140 135 - 145 mmol/L   Potassium 3.6 3.5 - 5.1 mmol/L   Chloride 107 98 - 111 mmol/L   CO2 23 22 - 32 mmol/L   Glucose, Bld 100 (H) 70 - 99 mg/dL    Comment: Glucose reference range applies only to samples taken after fasting for at least 8 hours.   BUN 22 8 - 23 mg/dL   Creatinine, Ser 2.44 0.44 - 1.00 mg/dL   Calcium 8.8 (L) 8.9 - 10.3 mg/dL   GFR, Estimated >01 >02 mL/min    Comment: (NOTE) Calculated using the CKD-EPI Creatinine Equation (2021)    Anion gap 10  5 - 15    Comment: Performed at [facility], [address] [place], [place] [address]  Magnesium     Status: None   Collection Time: 05/10/23  4:41 AM  Result Value Ref Range   Magnesium 1.7 1.7 - 2.4 mg/dL    Comment: Performed at [facility], [address], [place], [place] [address]  Phosphorus     Status: Abnormal  Collection Time: 05/10/23  4:41 AM  Result Value Ref Range   Phosphorus 2.3 (L) 2.5 - 4.6 mg/dL    Comment: Performed at [facility], [address] [place], [place] [address]    Current Facility-Administered Medications  Medication Dose Route Frequency Provider Last Rate Last Admin   0.9 %  sodium chloride infusion  250 mL Intravenous PRN Edwin Cap, DPM   Stopped at 05/06/23 1505   acetaminophen (TYLENOL) tablet 500 mg  500 mg Oral Q4H PRN Edwin Cap, DPM       amoxicillin-clavulanate (AUGMENTIN) 875-125 MG per tablet 1 tablet  1 tablet Oral Q12H Esaw Grandchild A, DO   1 tablet at 05/10/23 1101   aspirin EC tablet 81 mg  81 mg Oral Daily Edwin Cap, DPM   81 mg at 05/10/23 1100   atorvastatin (LIPITOR) tablet 10 mg  10 mg Oral Daily Edwin Cap, DPM   10 mg at 05/10/23 1104   cholecalciferol (VITAMIN D3) 25 MCG (1000 UNIT) tablet 1,000 Units  1,000 Units Oral Daily Edwin Cap, DPM   1,000 Units at 05/10/23 1100   clonazePAM (KLONOPIN) tablet 0.5 mg  0.5 mg Oral BID PRN Edwin Cap, DPM       clopidogrel (PLAVIX) tablet 75 mg  75 mg Oral Q breakfast Edwin Cap, DPM   75 mg at 05/10/23 1101   enoxaparin (LOVENOX) injection 40 mg  40 mg Subcutaneous Q24H Edwin Cap, DPM   40 mg at 05/04/23 2241   feeding supplement (ENSURE ENLIVE / ENSURE PLUS) liquid 237 mL  237 mL Oral BID BM Edwin Cap, DPM   237 mL at 05/10/23 1516   [START ON 05/11/2023] ferrous sulfate tablet 325 mg  325 mg Oral Q breakfast Esaw Grandchild A, DO       hydrALAZINE (APRESOLINE) injection 5 mg  5 mg Intravenous Q20  Min PRN McDonald, Rachelle Hora, DPM       labetalol (NORMODYNE) injection 10 mg  10 mg Intravenous Q10 min PRN Edwin Cap, DPM       lamoTRIgine (LAMICTAL) tablet 25 mg  25 mg Oral BID Edwin Cap, DPM   25 mg at 05/10/23 1101   melatonin tablet 5 mg  5 mg Oral QHS Edwin Cap, DPM   5 mg at 05/04/23 2241   morphine (PF) 2 MG/ML injection 2 mg  2 mg Intravenous Q4H PRN Edwin Cap, DPM       multivitamin with minerals tablet 1 tablet  1 tablet Oral Daily Edwin Cap, DPM   1 tablet at 05/10/23 1101   OLANZapine (ZYPREXA) tablet 5 mg  5 mg Oral QHS Edwin Cap, DPM   5 mg at 05/04/23 2241   ondansetron (ZOFRAN) injection 4 mg  4 mg Intravenous Q6H PRN Edwin Cap, DPM       ondansetron (ZOFRAN-ODT) disintegrating tablet 4 mg  4 mg Oral Q6H PRN Edwin Cap, DPM       oxyCODONE (Oxy IR/ROXICODONE) immediate release tablet 5-10 mg  5-10 mg Oral Q4H PRN Edwin Cap, DPM   5 mg at 05/09/23 1122   polyethylene glycol (MIRALAX / GLYCOLAX) packet 34 g  34 g Oral Daily McDonaldRachelle Hora, DPM   34 g at 05/05/23 1017   senna-docusate (Senokot-S) tablet 2 tablet  2 tablet Oral BID Edwin Cap, DPM   2 tablet at 05/10/23 1100   sertraline (ZOLOFT)  tablet 100 mg  100 mg Oral Daily Edwin Cap, DPM   100 mg at 05/10/23 1100   sodium chloride flush (NS) 0.9 % injection 3 mL  3 mL Intravenous Q12H Edwin Cap, DPM   3 mL at 05/10/23 1105   sodium chloride flush (NS) 0.9 % injection 3 mL  3 mL Intravenous PRN Edwin Cap, DPM       traZODone (DESYREL) tablet 100 mg  100 mg Oral QHS Edwin Cap, DPM   100 mg at 05/04/23 2241   Current Outpatient Medications  Medication Sig Dispense Refill   acetaminophen (TYLENOL) 500 MG tablet Take 500 mg by mouth every 4 (four) hours as needed for mild pain or fever.      cholecalciferol (VITAMIN D3) 25 MCG (1000 UT) tablet Take 1,000 Units by mouth daily.     clonazePAM (KLONOPIN) 0.5 MG tablet Take 0.5 mg by mouth  every 12 (twelve) hours as needed for anxiety.     lamoTRIgine (LAMICTAL) 25 MG tablet Take 25 mg by mouth 2 (two) times daily.     melatonin 5 MG TABS Take 5 mg by mouth at bedtime.     Multiple Vitamins-Minerals (MULTIVITAMIN ADULT, MINERALS,) TABS Take 1 tablet by mouth daily.     OLANZapine (ZYPREXA) 5 MG tablet Take 5 mg by mouth at bedtime.     senna-docusate (SENOKOT-S) 8.6-50 MG tablet Take 2 tablets by mouth 2 (two) times daily.     sertraline (ZOLOFT) 100 MG tablet Take 100 mg by mouth daily.     traZODone (DESYREL) 100 MG tablet Take 100 mg by mouth at bedtime.     amoxicillin-clavulanate (AUGMENTIN) 875-125 MG tablet Take 1 tablet by mouth every 12 (twelve) hours for 5 days.     [START ON 05/11/2023] aspirin EC 81 MG tablet Take 1 tablet (81 mg total) by mouth daily. Swallow whole.     [START ON 05/11/2023] atorvastatin (LIPITOR) 10 MG tablet Take 1 tablet (10 mg total) by mouth daily.     [START ON 05/11/2023] clopidogrel (PLAVIX) 75 MG tablet Take 1 tablet (75 mg total) by mouth daily with breakfast.     feeding supplement, ENSURE ENLIVE, (ENSURE ENLIVE) LIQD Take 237 mLs by mouth 2 (two) times daily between meals. 237 mL 12   [START ON 05/11/2023] ferrous sulfate 325 (65 FE) MG tablet Take 1 tablet (325 mg total) by mouth daily with breakfast.     oxyCODONE (OXY IR/ROXICODONE) 5 MG immediate release tablet Take 1 tablet (5 mg total) by mouth every 4 (four) hours as needed for moderate pain or severe pain. 30 tablet 0   [START ON 05/11/2023] polyethylene glycol (MIRALAX / GLYCOLAX) 17 g packet Take 34 g by mouth daily.      Musculoskeletal: Strength & Muscle Tone:  n/a Gait & Station:  n/a Patient leans: N/A     Psychiatric Specialty Exam:  Presentation  General Appearance: Appropriate for Environment  Eye Contact:Good  Speech:Clear and Coherent  Speech Volume:Normal  Handedness:No data recorded  Mood and Affect  Mood:Euthymic  Affect:Appropriate   Thought  Process  Thought Processes:Coherent  Descriptions of Associations:Intact  Orientation:Full (Time, Place and Person)  Thought Content:Logical  History of Schizophrenia/Schizoaffective disorder:No data recorded Duration of Psychotic Symptoms:No data recorded Hallucinations:Hallucinations: None  Ideas of Reference:None  Suicidal Thoughts:Suicidal Thoughts: No  Homicidal Thoughts:Homicidal Thoughts: No   Sensorium  Memory:Immediate Good; Other (comment) (dementia diagnosis)  Judgment:Good  Insight:Good   Executive Functions  Concentration:Good  Attention Span:Good  Recall:Good  Fund of Knowledge:Good  Language:Good   Psychomotor Activity  Psychomotor Activity:Psychomotor Activity: Normal   Assets  Assets:Communication Skills   Sleep  Sleep:Sleep: Good   Physical Exam: Physical Exam Vitals and nursing note reviewed.  Neurological:     Mental Status: She is oriented to person, place, and time.    Review of Systems  Psychiatric/Behavioral:  Negative for hallucinations, memory loss, substance abuse and suicidal ideas.   All other systems reviewed and are negative.  Blood pressure (!) 122/56, pulse 84, temperature 99.2 F (37.3 C), resp. rate 18, height 5\' 7"  (1.702 m), weight 54.4 kg, SpO2 96%. Body mass index is 18.79 kg/m.  Treatment Plan Summary: Daily contact with patient to assess and evaluate symptoms and progress in treatment  Disposition: No evidence of imminent risk to self or others at present.   Patient does not meet criteria for psychiatric inpatient admission. Supportive therapy provided about ongoing stressors. Discussed crisis plan, support from social network, calling 911, coming to the Emergency Department, and calling Suicide Hotline.  Mcneil Sober, NP 05/10/2023 4:30 PM

## 2023-05-10 NOTE — Consult Note (Addendum)
Consultation Note Date: 05/10/2023   Patient Name: [name]  DOB: [birth year]  MRN: [id number]  Age / Sex: 82 y.o., female  PCP: [facility] Referring Physician: Pennie Banter, DO  Reason for Consultation: Establishing goals of care   HPI/Patient Profile: "[name] is a 82 y.o. female with medical history significant of Alzheimer's dementia who lives in assisted living facility comes in on account of left foot pain that has been going on for the last 4 days.  According to patient she started noticing redness involving the left foot with associated swelling that have not been improving.  She denied history of diabetes, or any peripheral vascular disease that she knows of.  She denies cough nausea vomiting abdominal pain or chest pain " .  Clinical Assessment and Goals of Care: Notes and labs reviewed.  Patient has DSS legal guardian.  In to see patient.  No family or visitors at bedside.  Patient states she is divorced and has 2 children.  She states 1 lives in [place] and the other lives in [place].  She states she has lived in [place] for about 15 years, and currently lives in a facility.  She is able to tell me where she is, the year, the president, and the issues of this hospitalization.  She advises that she has bipolar disorder and during a "high episode" she spent all the money that she had including cashing out her savings and bonds.  She states she ran out of money and did not have a place to live.  She states she needed facility placement and DSS took over guardianship.  Upon broaching goals of care she states that though DSS is her legal decision maker, they do speak with her about her care, and she states that they are aware of her wishes regarding care moving forward, and limits to care.  She confirms DNR/DNI status.  She states she does want to continue to  treat the treatable.    She is able to articulate the surgery that she has undergone and the reason for this.  Discussed her medications and the purpose of them.  She states she will take the medications as ordered.  Spoke with nurse regarding patient's agreement to take medication.  Returned back to bedside as RN was administering medications and patient's declined a couple of them, but with conversation and explanation, she agreed to take them.  Attending in the bedside and updated.  SUMMARY OF RECOMMENDATIONS   Patient confirms DNR/DNI status.  She would like to continue to treat the treatable.  She agrees to take her medications as the purpose of them has been explained to her.  PMT will follow.  Prognosis:  Unable to determine       Primary Diagnoses: Present on Admission:  Cellulitis  Bipolar disorder (HCC)  Dementia with behavioral disturbance (HCC)  PAD (peripheral artery disease) (HCC)   I have reviewed the medical record, interviewed the patient and family, and examined the patient. The following  aspects are pertinent.  Past Medical History:  Diagnosis Date   Alzheimer's dementia (HCC)    Insomnia    Low back pain    Vomiting    Social History   Socioeconomic History   Marital status: Divorced    Spouse name: Not on file   Number of children: Not on file   Years of education: Not on file   Highest education level: Not on file  Occupational History   Not on file  Tobacco Use   Smoking status: Former   Smokeless tobacco: Never  Substance and Sexual Activity   Alcohol use: Not Currently   Drug use: Not Currently   Sexual activity: Not on file  Other Topics Concern   Not on file  Social History Narrative   Not on file   Social Determinants of Health   Financial Resource Strain: Not on file  Food Insecurity: Not on file  Transportation Needs: Not on file  Physical Activity: Not on file  Stress: Not on file  Social Connections: Not on file   No  family history on file. Scheduled Meds:  amoxicillin-clavulanate  1 tablet Oral Q12H   aspirin EC  81 mg Oral Daily   atorvastatin  10 mg Oral Daily   cholecalciferol  1,000 Units Oral Daily   clopidogrel  75 mg Oral Q breakfast   enoxaparin (LOVENOX) injection  40 mg Subcutaneous Q24H   feeding supplement  237 mL Oral BID BM   lamoTRIgine  25 mg Oral BID   melatonin  5 mg Oral QHS   multivitamin with minerals  1 tablet Oral Daily   OLANZapine  5 mg Oral QHS   polyethylene glycol  34 g Oral Daily   senna-docusate  2 tablet Oral BID   sertraline  100 mg Oral Daily   sodium chloride flush  3 mL Intravenous Q12H   traZODone  100 mg Oral QHS   Continuous Infusions:  sodium chloride Stopped (05/06/23 1505)   PRN Meds:.sodium chloride, acetaminophen, clonazePAM, hydrALAZINE, labetalol, morphine injection, ondansetron (ZOFRAN) IV, ondansetron, oxyCODONE, sodium chloride flush Medications Prior to Admission:  Prior to Admission medications   Medication Sig Start Date End Date Taking? Authorizing Provider  acetaminophen (TYLENOL) 500 MG tablet Take 500 mg by mouth every 4 (four) hours as needed for mild pain or fever.    Yes [provider]  cholecalciferol (VITAMIN D3) 25 MCG (1000 UT) tablet Take 1,000 Units by mouth daily.   Yes [provider]  clonazePAM (KLONOPIN) 0.5 MG tablet Take 0.5 mg by mouth every 12 (twelve) hours as needed for anxiety.   Yes [provider]  lamoTRIgine (LAMICTAL) 25 MG tablet Take 25 mg by mouth 2 (two) times daily.   Yes [provider]  melatonin 5 MG TABS Take 5 mg by mouth at bedtime.   Yes [provider]  Multiple Vitamins-Minerals (MULTIVITAMIN ADULT, MINERALS,) TABS Take 1 tablet by mouth daily.   Yes [provider]  OLANZapine (ZYPREXA) 5 MG tablet Take 5 mg by mouth at bedtime.   Yes [provider]  senna-docusate (SENOKOT-S) 8.6-50 MG tablet Take 2 tablets by mouth 2 (two) times daily.    Yes [provider]  sertraline (ZOLOFT) 100 MG tablet Take 100 mg by mouth daily. 02/21/23  Yes [provider]  traZODone (DESYREL) 100 MG tablet Take 100 mg by mouth at bedtime. 02/21/23  Yes [provider]  feeding supplement, ENSURE ENLIVE, (ENSURE ENLIVE) LIQD Take 237 mLs  by mouth 2 (two) times daily between meals. 09/27/19   Rolly Salter, MD   No Known Allergies Review of Systems  All other systems reviewed and are negative.   Physical Exam Pulmonary:     Effort: Pulmonary effort is normal.  Neurological:     Mental Status: She is alert.     Vital Signs: BP (!) 120/51 (BP Location: Right Arm)   Pulse 66   Temp 98.1 F (36.7 C)   Resp 14   Ht 5\' 7"  (1.702 m)   Wt 54.4 kg   SpO2 91%   BMI 18.79 kg/m  Pain Scale: PAINAD POSS *See Group Information*: S-Acceptable,Sleep, easy to arouse Pain Score: 0-No pain   SpO2: SpO2: 91 % O2 Device:SpO2: 91 % O2 Flow Rate: .O2 Flow Rate (L/min): 2 L/min  IO: Intake/output summary:  Intake/Output Summary (Last 24 hours) at 05/10/2023 1226 Last data filed at 05/10/2023 0500 Gross per 24 hour  Intake 580 ml  Output 300 ml  Net 280 ml    LBM: Last BM Date : 05/04/23 Baseline Weight: Weight: 54.4 kg Most recent weight: Weight: 54.4 kg      Signed by: Morton Stall, NP   Please contact Palliative Medicine Team phone at [phone] for questions and concerns.  For individual provider: See Loretha Stapler

## 2023-05-10 NOTE — Care Management Important Message (Signed)
Important Message  Patient Details  Name: [name] MRN: [id number] Date of Birth: [birth year]   Medicare Important Message Given:  Yes  I reviewed the Important Message from Medicare with patient's legal guardian, [name] by phone [phone] and she was in agreement with her discharge. I thanked her for her time and wished her a good evening.    Olegario Messier A Avaleen Brownley 05/10/2023, 3:08 PM

## 2023-05-10 NOTE — Discharge Summary (Addendum)
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 *  Hospital Course:  "[name] is a 82 y.o. female with medical history significant of Alzheimer's dementia who lives in assisted living facility comes in on account of left foot pain that has been going on for the last 4 days.  According to patient she started noticing redness involving the left foot with associated swelling that have not been improving.  She denied history of diabetes, or any peripheral vascular disease that she knows of.  She denies cough nausea vomiting abdominal pain or chest pain "   Further hospital course and management as outlined below. In summary, patient underwent LLE angiogram with intervention for PAD by vascular surgery on 8/21, followed by left 2nd toe amputation by podiatry on 8/22.  Treated initially with empiric IV antibiotics, transitioned to PO Augmentin after surgery.  Complication post-op factor has been patient refusal of medications and most basic patient care by nursing staff.  This improved as of 8/27 AM.   Prognosis guarded Pt is increased risk for increased morbidity and mortality due to refusal to take medications.  New LLE stent at risk for thrombosis without DAPT  which pt refuses.  At risk for dehydration and electrolyte derangements with poor PO intake.  At risk for skin breakdown due to refusal to reposition in the bed.    05/10/23 -- pt doing better this AM, more interactive, sitting up eating breakfast.  She agreed and took her medications this AM, after explaining what each is for.  No acute complaints. Denies pain.   Assessment and Plan: # Osteomyelitis of left 2nd toe - Seen on MRI # Sepsis, severe - RULED OUT - pt had only leukocytosis, no other SIRS criteria # Lactic acidosis. Lactate has normalized. - Vascular surgery following - angiogram 8/21 - with stent placed to L superficial femoral and popliteal arteries, thrombectomy of left SFA - Podiatry consulted -- pt underwent left 2nd toe amputation 8/22 - follow up with Podiatry as schedule - their office is to arrange - treated with empiric IV ceftriaxone/vanc/flagyl - transitioned to PO Augmentin x 7 days per podiatry   Podiatry recommends: -WBAT in postop shoe -No dressing changes needed at discharge -Finish remainder of 7 days Augmentin  -My office will arrange follow-up in 1 to 2 weeks for postop visit   # PAD Vascular consulted, needs angiogram but this was initially on hold pending DSS authorization. After I complained to his DSS caseworker on 8/19 things were escalated to the supervisor and consent to proceed w/ angiogram was given on 8/19. Angiogram completed 8/21 as above - mgmt per vascular surgery - cont ASA/plavix --pt has been counseled multiple times on importance of ASA/plavix to keep her stent open, risk of severe complications if  stent gets thrombosed   # Refusing care -- pt refusing meds since 8/24, including her psychiatric medications and DAPT (has new stent in LLE).  Also resistant to repositioning in bed and other nursing care tasks.  Little PO intake. 8/27 - improved, took AM meds, ate some breakfast, more interactive - will get psychiatry's input - continue to  encourage pt to take meds, reposition, etc - labs stable, not showing dehydration - palliative care consulted - appreciate assistance in GOC discussions   #Hypomagnesemia Mg 1.5 on 8/26 was replaced Monitor & replace PRN   # Iron deficiency anemia -- Hbg stable. - hold of iron infusion given infection - started PO iron   # Bipolar disorder - cont home olanzapine, sertraline, trazodone, lamotrigine, clonazepam   # Dementia No behavioral disturbance. Resides at [facility] Delirium precautions   # Guardianship - DSS guardian          Consultants: Vascular Surgery, Podiatry, Palliative Care, Psychiatry Procedures performed:  LLE angiogram and stent placement Left 2nd toe amputation   Disposition: Skilled nursing facility  Diet recommendation:  Discharge Diet Orders (From admission, onward)     Start     Ordered   05/10/23 0000  Diet - regular   05/10/23 1419            DISCHARGE MEDICATION: Allergies as of 05/10/2023   No Known Allergies      Medication List     TAKE these medications    acetaminophen 500 MG tablet Commonly known as: TYLENOL Take 500 mg by mouth every 4 (four) hours as needed for mild pain or fever.   amoxicillin-clavulanate 875-125 MG tablet Commonly known as: AUGMENTIN Take 1 tablet by mouth every 12 (twelve) hours for 5 days.   aspirin EC 81 MG tablet Take 1 tablet (81 mg total) by mouth daily. Swallow whole. Start taking on: May 11, 2023   atorvastatin 10 MG tablet Commonly known as: LIPITOR Take 1 tablet (10 mg total) by mouth daily. Start taking on: May 11, 2023   cholecalciferol 25 MCG (1000 UNIT) tablet Commonly known as: VITAMIN D3 Take 1,000 Units by mouth daily.   clonazePAM 0.5 MG tablet Commonly known as: KLONOPIN Take 0.5 mg by mouth every 12 (twelve) hours as needed for anxiety.   clopidogrel 75 MG tablet Commonly known as: PLAVIX Take 1 tablet (75 mg total) by mouth daily with breakfast. Start  taking on: May 11, 2023   feeding supplement Liqd Take 237 mLs by mouth 2 (two) times daily between meals.   ferrous sulfate 325 (65 FE) MG tablet Take 1 tablet (325 mg total) by mouth daily with breakfast. Start taking on: May 11, 2023   lamoTRIgine 25 MG tablet Commonly known as: LAMICTAL Take 25 mg by mouth 2 (two) times daily.   melatonin 5 MG Tabs Take 5 mg by mouth at bedtime.   Multivitamin Adult (Minerals) Tabs Take 1 tablet by mouth daily.   OLANZapine 5 MG tablet Commonly known as: ZYPREXA Take 5 mg by mouth at bedtime.   oxyCODONE 5 MG immediate release tablet Commonly known as: Oxy IR/ROXICODONE Take 1 tablet (5 mg total) by mouth every 4 (four) hours as needed for moderate pain or severe pain.   polyethylene glycol 17 g packet Commonly known as: MIRALAX / GLYCOLAX Take 34 g by mouth daily. Start taking on: May 11, 2023   senna-docusate 8.6-50 MG tablet Commonly known as: Senokot-S Take 2 tablets by mouth 2 (two) times daily.  sertraline 100 MG tablet Commonly known as: ZOLOFT Take 100 mg by mouth daily.   traZODone 100 MG tablet Commonly known as: DESYREL Take 100 mg by mouth at bedtime.               Discharge Care Instructions  (From admission, onward)           Start     Ordered   05/10/23 0000  Leave dressing on - Keep it clean, dry, and intact until clinic visit        05/10/23 1419            Contact information for follow-up providers     McDonald, Rachelle Hora, DPM. Schedule an appointment as soon as possible for a visit.   Specialty: Podiatry Why: Needs postop follow up in ~ 2 weeks Contact information: [address] [place] [address] [phone]         Schnier, Latina Craver, MD. Schedule an appointment as soon as possible for a visit.   Specialties: Vascular Surgery, Cardiology, Radiology, Vascular Surgery Why: Follow up for PAD s/p stent placement Contact information: [address] [place] [address] [phone]         [facility]. Schedule an appointment as soon as possible for a visit.   Specialty: Geriatric Medicine Why: Hospital follow up in 1-2 weeks Contact information: [address] [place] [address] [phone]              Contact information for after-discharge care     Destination     [facility] .   Service: Skilled Nursing Contact information: [address] [place] [address] [phone]                    Discharge Exam: Ceasar Mons Weights   04/28/23 1206  Weight: 54.4 kg   General exam: awake, alert, no acute distress HEENT: atraumatic, clear conjunctiva, anicteric sclera, moist mucus membranes, hearing grossly normal  Respiratory system: CTAB, no wheezes, rales or rhonchi, normal respiratory effort. Cardiovascular system: normal S1/S2, RRR.   Gastrointestinal system: soft, NT, ND, no HSM felt, +bowel sounds. Central nervous system: A&O x3. no gross focal neurologic deficits, normal speech Extremities: left foot dressing clean dry intact with distal temp warm and sensation intact Skin: dry, intact, normal temperature Psychiatry: normal mood, congruent affect, judgement and insight appear normal   Condition at discharge: stable  The results of significant diagnostics from this hospitalization (including imaging, microbiology, ancillary and laboratory) are listed below for reference.   Imaging Studies: PERIPHERAL VASCULAR CATHETERIZATION  Result Date: 05/04/2023 See surgical note for result.  MR FOOT LEFT W WO CONTRAST  Result Date: 04/29/2023 CLINICAL DATA:  Left foot infection.  Left second toe wound. EXAM: MRI OF THE LEFT FOREFOOT WITHOUT AND WITH CONTRAST TECHNIQUE: Multiplanar, multisequence MR imaging of the left forefoot was performed both before and after administration of intravenous contrast. CONTRAST:  5mL GADAVIST  GADOBUTROL 1 MMOL/ML IV SOLN COMPARISON:  Left foot x-rays from same day. FINDINGS: Bones/Joint/Cartilage Abnormal marrow edema and enhancement with corresponding patchy decreased T1 marrow signal involving the second proximal phalanx head and shaft and majority of the second middle phalanx. Remaining bone marrow signal is normal. No fracture or dislocation. Mild hallux valgus deformity. Mild first MTP and first IP joint osteoarthritis. No joint effusion. Ligaments Collateral ligaments are intact.  Lisfranc ligament is intact. Muscles and Tendons Flexor and extensor tendons are  intact. No tenosynovitis. Complete fatty atrophy of the intrinsic foot muscles. Soft tissue Diffuse soft tissue swelling with mild enhancement. Dorsal second toe ulceration extending near bone. No fluid collection. No soft tissue mass. IMPRESSION: 1. Dorsal second toe ulceration with underlying osteomyelitis of the second proximal and middle phalanges. Forefoot cellulitis without abscess. Electronically Signed   By: Obie Dredge M.D.   On: 04/29/2023 08:04   DG Foot Complete Left  Result Date: 04/28/2023 CLINICAL DATA:  Infection with wound along the second toe of the left foot. Erythema. EXAM: LEFT FOOT - COMPLETE 3 VIEW COMPARISON:  None Available. FINDINGS: Severe osteopenia. No acute fracture or dislocation. No definite erosive changes. There is soft tissue swelling about the midfoot and involving the second digit. Question some soft tissue gas. If there is further concern bone infection or osteomyelitis, a bone scan or MRI may be useful for further sensitivity. IMPRESSION: Soft tissue swelling.  Severe osteopenia.  Question soft tissue gas. Electronically Signed   By: Karen Kays M.D.   On: 04/28/2023 17:31    Microbiology: Results for orders placed or performed during the hospital encounter of 04/28/23  Blood culture (routine x 2)     Status: Abnormal   Collection Time: 04/28/23  6:24 PM   Specimen: BLOOD  Result Value  Ref Range Status   Specimen Description   Final    BLOOD BLOOD RIGHT ARM Performed at [facility], [address], [place], [place] [address]    Special Requests   Final    BOTTLES DRAWN AEROBIC AND ANAEROBIC Blood Culture results may not be optimal due to an inadequate volume of blood received in culture bottles Performed at [facility], [address] [place], [place] [address]    Culture  Setup Time   Final    GRAM POSITIVE COCCI ANAEROBIC BOTTLE ONLY STAPHYLOCOCCUS SPECIES CRITICAL RESULT CALLED TO, READ BACK BY AND VERIFIED WITH: NATHAN BELEU @ 2132 04/29/23 [facility]    Culture (A)  Final    STAPHYLOCOCCUS HOMINIS THE SIGNIFICANCE OF ISOLATING THIS ORGANISM FROM A SINGLE SET OF BLOOD CULTURES WHEN MULTIPLE SETS ARE DRAWN IS UNCERTAIN. PLEASE NOTIFY THE MICROBIOLOGY DEPARTMENT WITHIN ONE WEEK IF SPECIATION AND SENSITIVITIES ARE REQUIRED. Performed at [facility], [address], [place], [place] [address]    Report Status 05/02/2023 FINAL  Final  Blood culture (routine x 2)     Status: None   Collection Time: 04/28/23  6:24 PM   Specimen: BLOOD  Result Value Ref Range Status   Specimen Description BLOOD BLOOD RIGHT FOREARM  Final   Special Requests   Final    BOTTLES DRAWN AEROBIC AND ANAEROBIC Blood Culture results may not be optimal due to an inadequate volume of blood received in culture bottles   Culture   Final    NO GROWTH 5 DAYS Performed at [facility], [address] [place], [place] [address]    Report Status 05/03/2023 FINAL  Final  Blood Culture ID Panel (Reflexed)     Status: Abnormal   Collection Time: 04/28/23  6:24 PM  Result Value Ref Range Status   Enterococcus faecalis NOT DETECTED NOT DETECTED Final   Enterococcus Faecium NOT DETECTED NOT DETECTED Final   Listeria monocytogenes NOT DETECTED NOT DETECTED Final   Staphylococcus species DETECTED (A) NOT DETECTED Final    Comment: CRITICAL RESULT CALLED TO, READ BACK BY  AND VERIFIED WITH: NATHAN BELUE @ 2132 04/29/23 [facility]    Staphylococcus aureus (BCID)  NOT DETECTED NOT DETECTED Final   Staphylococcus epidermidis NOT DETECTED NOT DETECTED Final   Staphylococcus lugdunensis NOT DETECTED NOT DETECTED Final   Streptococcus species NOT DETECTED NOT DETECTED Final   Streptococcus agalactiae NOT DETECTED NOT DETECTED Final   Streptococcus pneumoniae NOT DETECTED NOT DETECTED Final   Streptococcus pyogenes NOT DETECTED NOT DETECTED Final   A.calcoaceticus-baumannii NOT DETECTED NOT DETECTED Final   Bacteroides fragilis NOT DETECTED NOT DETECTED Final   Enterobacterales NOT DETECTED NOT DETECTED Final   Enterobacter cloacae complex NOT DETECTED NOT DETECTED Final   Escherichia coli NOT DETECTED NOT DETECTED Final   Klebsiella aerogenes NOT DETECTED NOT DETECTED Final   Klebsiella oxytoca NOT DETECTED NOT DETECTED Final   Klebsiella pneumoniae NOT DETECTED NOT DETECTED Final   Proteus species NOT DETECTED NOT DETECTED Final   Salmonella species NOT DETECTED NOT DETECTED Final   Serratia marcescens NOT DETECTED NOT DETECTED Final   Haemophilus influenzae NOT DETECTED NOT DETECTED Final   Neisseria meningitidis NOT DETECTED NOT DETECTED Final   Pseudomonas aeruginosa NOT DETECTED NOT DETECTED Final   Stenotrophomonas maltophilia NOT DETECTED NOT DETECTED Final   Candida albicans NOT DETECTED NOT DETECTED Final   Candida auris NOT DETECTED NOT DETECTED Final   Candida glabrata NOT DETECTED NOT DETECTED Final   Candida krusei NOT DETECTED NOT DETECTED Final   Candida parapsilosis NOT DETECTED NOT DETECTED Final   Candida tropicalis NOT DETECTED NOT DETECTED Final   Cryptococcus neoformans/gattii NOT DETECTED NOT DETECTED Final    Comment: Performed at [facility], [address] [place], [place] [address]    Labs: CBC: Recent Labs  Lab 05/05/23 0519 05/07/23 0510 05/08/23 0510 05/09/23 0520  WBC 10.1 12.0* 10.0 9.7  HGB 10.6* 9.6* 8.7* 9.5*   HCT 33.9* 31.0* 28.5* 30.6*  MCV 91.4 92.5 93.1 92.7  PLT 384 328 381 416*   Basic Metabolic Panel: Recent Labs  Lab 05/05/23 0519 05/07/23 0510 05/09/23 0520 05/10/23 0441  NA  --  140 141 140  K  --  3.8 3.6 3.6  CL  --  108 109 107  CO2  --  23 23 23   GLUCOSE  --  94 94 100*  BUN  --  15 24* 22  CREATININE 0.58 0.58 0.59 0.47  CALCIUM  --  8.6* 8.7* 8.8*  MG  --   --  1.5* 1.7  PHOS  --   --   --  2.3*   Liver Function Tests: No results for input(s): "AST", "ALT", "ALKPHOS", "BILITOT", "PROT", "ALBUMIN" in the last 168 hours. CBG: No results for input(s): "GLUCAP" in the last 168 hours.  Discharge time spent: less than 30 minutes.  Signed: Pennie Banter, DO [facility] 05/10/2023

## 2023-05-10 NOTE — Plan of Care (Signed)
  Problem: Clinical Measurements: Goal: Cardiovascular complication will be avoided Outcome: Progressing   Problem: Activity: Goal: Risk for activity intolerance will decrease Outcome: Progressing   Problem: Nutrition: Goal: Adequate nutrition will be maintained Outcome: Progressing   Problem: Pain Managment: Goal: General experience of comfort will improve Outcome: Progressing   Problem: Activity: Goal: Ability to return to baseline activity level will improve Outcome: Progressing

## 2023-05-13 NOTE — Anesthesia Postprocedure Evaluation (Signed)
Anesthesia Post Note  Patient: [name]  Procedure(s) Performed: AMPUTATION TOE 2nd toe (Left: Second Toe)  Patient location during evaluation: PACU Anesthesia Type: MAC Level of consciousness: awake and alert Pain management: pain level controlled Vital Signs Assessment: post-procedure vital signs reviewed and stable Respiratory status: spontaneous breathing, nonlabored ventilation, respiratory function stable and patient connected to nasal cannula oxygen Cardiovascular status: blood pressure returned to baseline and stable Postop Assessment: no apparent nausea or vomiting Anesthetic complications: no   No notable events documented.   Last Vitals:  Vitals:   05/10/23 0735 05/10/23 1524  BP: (!) 120/51 (!) 122/56  Pulse: 66 84  Resp: 14 18  Temp: 36.7 C 37.3 C  SpO2: 91% 96%    Last Pain:  Vitals:   05/10/23 0946  TempSrc:   PainSc: 0-No pain                 Yevette Edwards

## 2023-05-16 LAB — SURGICAL PATHOLOGY

## 2023-05-19 ENCOUNTER — Ambulatory Visit (INDEPENDENT_AMBULATORY_CARE_PROVIDER_SITE_OTHER): Payer: Medicare Other | Admitting: Podiatry

## 2023-05-19 DIAGNOSIS — Z89422 Acquired absence of other left toe(s): Secondary | ICD-10-CM

## 2023-05-19 NOTE — Progress Notes (Signed)
Subjective:  Patient ID: [name], female    DOB: [birth year],  MRN: [id number]  Chief Complaint  Patient presents with   Routine Post Op    DOS: 05/06/2023 Procedure: Left second digit amputation  82 y.o. female returns for post-op check.  Patient states she is doing well minimal complaints.  Known to Dr. Abbott Pao who did the surgery.  Bandages clean dry and intact  Review of Systems: Negative except as noted in the HPI. Denies N/V/F/Ch.  Past Medical History:  Diagnosis Date   Alzheimer's dementia (HCC)    Insomnia    Low back pain    Vomiting     Current Outpatient Medications:    acetaminophen (TYLENOL) 500 MG tablet, Take 500 mg by mouth every 4 (four) hours as needed for mild pain or fever. , Disp: , Rfl:    aspirin EC 81 MG tablet, Take 1 tablet (81 mg total) by mouth daily. Swallow whole., Disp: , Rfl:    atorvastatin (LIPITOR) 10 MG tablet, Take 1 tablet (10 mg total) by mouth daily., Disp: , Rfl:    cholecalciferol (VITAMIN D3) 25 MCG (1000 UT) tablet, Take 1,000 Units by mouth daily., Disp: , Rfl:    clonazePAM (KLONOPIN) 0.5 MG tablet, Take 0.5 mg by mouth every 12 (twelve) hours as needed for anxiety., Disp: , Rfl:    clopidogrel (PLAVIX) 75 MG tablet, Take 1 tablet (75 mg total) by mouth daily with breakfast., Disp: , Rfl:    feeding supplement, ENSURE ENLIVE, (ENSURE ENLIVE) LIQD, Take 237 mLs by mouth 2 (two) times daily between meals., Disp: 237 mL, Rfl: 12   ferrous sulfate 325 (65 FE) MG tablet, Take 1 tablet (325 mg total) by mouth daily with breakfast., Disp: , Rfl:    lamoTRIgine (LAMICTAL) 25 MG tablet, Take 25 mg by mouth 2 (two) times daily., Disp: , Rfl:    melatonin 5 MG TABS, Take 5 mg by mouth at bedtime., Disp: , Rfl:    Multiple Vitamins-Minerals (MULTIVITAMIN ADULT, MINERALS,) TABS, Take 1 tablet by mouth daily., Disp: , Rfl:    OLANZapine (ZYPREXA) 5 MG tablet, Take 5 mg by mouth at bedtime., Disp: , Rfl:    oxyCODONE (OXY IR/ROXICODONE) 5 MG  immediate release tablet, Take 1 tablet (5 mg total) by mouth every 4 (four) hours as needed for moderate pain or severe pain., Disp: 30 tablet, Rfl: 0   polyethylene glycol (MIRALAX / GLYCOLAX) 17 g packet, Take 34 g by mouth daily., Disp: , Rfl:    senna-docusate (SENOKOT-S) 8.6-50 MG tablet, Take 2 tablets by mouth 2 (two) times daily., Disp: , Rfl:    sertraline (ZOLOFT) 100 MG tablet, Take 100 mg by mouth daily., Disp: , Rfl:    traZODone (DESYREL) 100 MG tablet, Take 100 mg by mouth at bedtime., Disp: , Rfl:   Social History   Tobacco Use  Smoking Status Former  Smokeless Tobacco Never    No Known Allergies Objective:  There were no vitals filed for this visit. There is no height or weight on file to calculate BMI. Constitutional Well developed. Well nourished.  Vascular Foot warm and well perfused. Capillary refill normal to all digits.   Neurologic Normal speech. Oriented to person, place, and time. Epicritic sensation to light touch grossly present bilaterally.  Dermatologic Skin healing well without signs of infection. Skin edges well coapted without signs of infection.  Orthopedic: Tenderness to palpation noted about the surgical site.   Radiographs: None Assessment:   1.  History of amputation of lesser toe of left foot (HCC)    Plan:  Patient was evaluated and treated and all questions answered.  S/p foot surgery left -Progressing as expected post-operatively. -XR: See above -WB Status: Weightbearing as tolerated in surgical shoe -Sutures: Intact.  No clinical signs of dehiscence noted no complication noted. -Medications: None -Foot redressed.  No follow-ups on file.

## 2023-05-24 ENCOUNTER — Other Ambulatory Visit (INDEPENDENT_AMBULATORY_CARE_PROVIDER_SITE_OTHER): Payer: Self-pay | Admitting: Vascular Surgery

## 2023-05-24 DIAGNOSIS — I70219 Atherosclerosis of native arteries of extremities with intermittent claudication, unspecified extremity: Secondary | ICD-10-CM | POA: Insufficient documentation

## 2023-05-24 DIAGNOSIS — I739 Peripheral vascular disease, unspecified: Secondary | ICD-10-CM

## 2023-05-24 NOTE — Progress Notes (Signed)
MRN : [id number]  [name] is a 82 y.o. (1941-03-02) female who presents with chief complaint of check circulation.  History of Present Illness:   The patient returns to the office for followup and review status post angiogram with intervention on 05/04/2023.   Procedure:  Percutaneous transluminal angioplasty and stent placement left superficial femoral artery and popliteal artery. 2.    Rota Rex thrombectomy left SFA.    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    The patient notes improvement in the lower extremity symptoms. No interval shortening of the patient's claudication distance or rest pain symptoms. No new ulcers or wounds have occurred since the last visit.  There have been no significant changes to the patient's overall health care.  No documented history of amaurosis fugax or recent TIA symptoms. There are no recent neurological changes noted. No documented history of DVT, PE or superficial thrombophlebitis. The patient denies recent episodes of angina or shortness of breath.   ABI's Rt=0.86 and Lt=1.31     No outpatient medications have been marked as taking for the 05/26/23 encounter (Appointment) with Gilda Crease, Latina Craver, MD.    Past Medical History:  Diagnosis Date   Alzheimer's dementia (HCC)    Insomnia    Low back pain    Vomiting     Past Surgical History:  Procedure Laterality Date   AMPUTATION TOE Left 05/06/2023   Procedure: AMPUTATION TOE 2nd toe;  Surgeon: Edwin Cap, DPM;  Location: [facility] ORS;  Service: Orthopedics/Podiatry;  Laterality: Left;   HIP ARTHROPLASTY Left 09/22/2019   Procedure: ARTHROPLASTY BIPOLAR HIP (HEMIARTHROPLASTY) RIGHT;  Surgeon: Christena Flake, MD;  Location: [facility] ORS;  Service: Orthopedics;  Laterality: Left;   LOWER EXTREMITY  INTERVENTION Left 05/04/2023   Procedure: LOWER EXTREMITY INTERVENTION;  Surgeon: Renford Dills, MD;  Location: [facility] IN[facility]IVE [facility];  Service: Cardiovascular;  Laterality: Left;   VAGINAL HYSTERECTOMY      Social History Social History   Tobacco Use   Smoking status: Former   Smokeless tobacco: Never  Substance Use Topics   Alcohol use: Not Currently   Drug use: Not Currently    Family History No family history on file.  No Known Allergies   REVIEW OF SYSTEMS (Negative unless checked)  Constitutional: [] Weight loss  [] Fever  [] Chills Cardiac: [] Chest pain   [] Chest pressure   [] Palpitations   [] Shortness of breath when laying flat   [] Shortness of breath with exertion. Vascular:  [x] Pain in legs with walking   [] Pain in legs at rest  [] History of DVT   [] Phlebitis   [] Swelling in legs   [] Varicose veins   [] Non-healing ulcers Pulmonary:   [] Uses home oxygen   [] Productive cough   [] Hemoptysis   [] Wheeze  [] COPD   [] Asthma Neurologic:  [] Dizziness   [] Seizures   [] History of stroke   [] History of TIA  [] Aphasia   [] Vissual changes   [] Weakness or numbness in arm   [] Weakness or numbness in leg Musculoskeletal:   [] Joint  swelling   [] Joint pain   [] Low back pain Hematologic:  [] Easy bruising  [] Easy bleeding   [] Hypercoagulable state   [] Anemic Gastrointestinal:  [] Diarrhea   [] Vomiting  [x] Gastroesophageal reflux/heartburn   [] Difficulty swallowing. Genitourinary:  [] Chronic kidney disease   [] Difficult urination  [] Frequent urination   [] Blood in urine Skin:  [] Rashes   [] Ulcers  Psychological:  [] History of anxiety   []  History of major depression.  Physical Examination  There were no vitals filed for this visit. There is no height or weight on file to calculate BMI. Gen: WD/WN, NAD Head: [place]/AT, No temporalis wasting.  Ear/Nose/Throat: Hearing grossly intact, nares w/o erythema or drainage Eyes: PER, EOMI, sclera nonicteric.  Neck: Supple, no masses.  No bruit or  JVD.  Pulmonary:  Good air movement, no audible wheezing, no use of accessory muscles.  Cardiac: RRR, normal S1, S2, no Murmurs. Vascular:  mild trophic changes, no open wounds Vessel Right Left  Radial Palpable Palpable  PT Not Palpable Not Palpable  DP Not Palpable Not Palpable  Gastrointestinal: soft, non-distended. No guarding/no peritoneal signs.  Musculoskeletal: M/S 5/5 throughout.  No visible deformity.  Neurologic: CN 2-12 intact. Pain and light touch intact in extremities.  Symmetrical.  Speech is fluent. Motor exam as listed above. Psychiatric: Judgment intact, Mood & affect appropriate for pt's clinical situation. Dermatologic: No rashes or ulcers noted.  No changes consistent with cellulitis.   CBC Lab Results  Component Value Date   WBC 9.7 05/09/2023   HGB 9.5 (L) 05/09/2023   HCT 30.6 (L) 05/09/2023   MCV 92.7 05/09/2023   PLT 416 (H) 05/09/2023    BMET    Component Value Date/Time   NA 140 05/10/2023 0441   NA 143 10/22/2012 0051   K 3.6 05/10/2023 0441   K 3.7 10/22/2012 0051   CL 107 05/10/2023 0441   CL 109 (H) 10/22/2012 0051   CO2 23 05/10/2023 0441   CO2 26 10/22/2012 0051   GLUCOSE 100 (H) 05/10/2023 0441   GLUCOSE 129 (H) 10/22/2012 0051   BUN 22 05/10/2023 0441   BUN 33 (H) 10/22/2012 0051   CREATININE 0.47 05/10/2023 0441   CREATININE 0.66 10/22/2012 0051   CALCIUM 8.8 (L) 05/10/2023 0441   CALCIUM 9.7 10/22/2012 0051   GFRNONAA >60 05/10/2023 0441   GFRNONAA >60 10/22/2012 0051   GFRAA >60 02/29/2020 1134   GFRAA >60 10/22/2012 0051   CrCl cannot be calculated (Unknown ideal weight.).  COAG No results found for: "INR", "PROTIME"  Radiology PERIPHERAL VASCULAR CATHETERIZATION  Result Date: 05/04/2023 See surgical note for result.  MR FOOT LEFT W WO CONTRAST  Result Date: 04/29/2023 CLINICAL DATA:  Left foot infection.  Left second toe wound. EXAM: MRI OF THE LEFT FOREFOOT WITHOUT AND WITH CONTRAST TECHNIQUE: Multiplanar,  multisequence MR imaging of the left forefoot was performed both before and after administration of intravenous contrast. CONTRAST:  5mL GADAVIST GADOBUTROL 1 MMOL/ML IV SOLN COMPARISON:  Left foot x-rays from same day. FINDINGS: Bones/Joint/Cartilage Abnormal marrow edema and enhancement with corresponding patchy decreased T1 marrow signal involving the second proximal phalanx head and shaft and majority of the second middle phalanx. Remaining bone marrow signal is normal. No fracture or dislocation. Mild hallux valgus deformity. Mild first MTP and first IP joint osteoarthritis. No joint effusion. Ligaments Collateral ligaments are intact.  Lisfranc ligament is intact. Muscles and Tendons Flexor and extensor tendons are intact. No tenosynovitis. Complete fatty atrophy of the intrinsic foot muscles. Soft tissue Diffuse  soft tissue swelling with mild enhancement. Dorsal second toe ulceration extending near bone. No fluid collection. No soft tissue mass. IMPRESSION: 1. Dorsal second toe ulceration with underlying osteomyelitis of the second proximal and middle phalanges. Forefoot cellulitis without abscess. Electronically Signed   By: Obie Dredge M.D.   On: 04/29/2023 08:04   DG Foot Complete Left  Result Date: 04/28/2023 CLINICAL DATA:  Infection with wound along the second toe of the left foot. Erythema. EXAM: LEFT FOOT - COMPLETE 3 VIEW COMPARISON:  None Available. FINDINGS: Severe osteopenia. No acute fracture or dislocation. No definite erosive changes. There is soft tissue swelling about the midfoot and involving the second digit. Question some soft tissue gas. If there is further concern bone infection or osteomyelitis, a bone scan or MRI may be useful for further sensitivity. IMPRESSION: Soft tissue swelling.  Severe osteopenia.  Question soft tissue gas. Electronically Signed   By: Karen Kays M.D.   On: 04/28/2023 17:31     Assessment/Plan 1. Atherosclerosis of native artery of both lower  extremities with intermittent claudication (HCC) Recommend:  The patient is status post successful angiogram with intervention.  The patient reports that the claudication symptoms and leg pain has improved.   The patient denies lifestyle limiting changes at this point in time.  No further invasive studies, angiography or surgery at this time The patient should continue walking and begin a more formal exercise program.  The patient should continue antiplatelet therapy and aggressive treatment of the lipid abnormalities  Continued surveillance is indicated as atherosclerosis is likely to progress with time.    Patient should undergo noninvasive studies as ordered. The patient will follow up with me to review the studies.  - VAS [place] ABI WITH/WO TBI; Future  2. 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  No further treatment at this time  3. Gastroesophageal reflux disease, unspecified whether esophagitis present Continue PPI as already ordered, this medication has been reviewed and there are no changes at this time.  Avoidence of caffeine and alcohol  Moderate elevation of the head of the bed   4. Hyperlipidemia, unspecified hyperlipidemia type Continue statin as ordered and reviewed, no changes at this time    Levora Dredge, MD  05/24/2023 9:07 AM

## 2023-05-26 ENCOUNTER — Ambulatory Visit (INDEPENDENT_AMBULATORY_CARE_PROVIDER_SITE_OTHER): Payer: Medicare Other

## 2023-05-26 ENCOUNTER — Encounter (INDEPENDENT_AMBULATORY_CARE_PROVIDER_SITE_OTHER): Payer: Self-pay | Admitting: Vascular Surgery

## 2023-05-26 ENCOUNTER — Ambulatory Visit (INDEPENDENT_AMBULATORY_CARE_PROVIDER_SITE_OTHER): Payer: Medicare Other | Admitting: Vascular Surgery

## 2023-05-26 VITALS — BP 103/64 | HR 56 | Resp 18 | Ht 67.0 in | Wt 120.0 lb

## 2023-05-26 DIAGNOSIS — I70213 Atherosclerosis of native arteries of extremities with intermittent claudication, bilateral legs: Secondary | ICD-10-CM | POA: Diagnosis not present

## 2023-05-26 DIAGNOSIS — Z9889 Other specified postprocedural states: Secondary | ICD-10-CM

## 2023-05-26 DIAGNOSIS — M869 Osteomyelitis, unspecified: Secondary | ICD-10-CM | POA: Diagnosis not present

## 2023-05-26 DIAGNOSIS — E785 Hyperlipidemia, unspecified: Secondary | ICD-10-CM

## 2023-05-26 DIAGNOSIS — I739 Peripheral vascular disease, unspecified: Secondary | ICD-10-CM | POA: Diagnosis not present

## 2023-05-26 DIAGNOSIS — K219 Gastro-esophageal reflux disease without esophagitis: Secondary | ICD-10-CM

## 2023-05-28 ENCOUNTER — Encounter (INDEPENDENT_AMBULATORY_CARE_PROVIDER_SITE_OTHER): Payer: Self-pay | Admitting: Vascular Surgery

## 2023-05-30 LAB — VAS US ABI WITH/WO TBI
Left ABI: 1.31
Right ABI: 0.86

## 2023-06-01 ENCOUNTER — Ambulatory Visit (INDEPENDENT_AMBULATORY_CARE_PROVIDER_SITE_OTHER): Payer: Medicare Other | Admitting: Podiatry

## 2023-06-01 ENCOUNTER — Encounter: Payer: Self-pay | Admitting: Podiatry

## 2023-06-01 VITALS — BP 111/56 | HR 60

## 2023-06-01 DIAGNOSIS — T8189XA Other complications of procedures, not elsewhere classified, initial encounter: Secondary | ICD-10-CM

## 2023-06-01 NOTE — Progress Notes (Signed)
Subjective:  Patient ID: [name], female    DOB: 03-Feb-1941,  MRN: [id number]  Chief Complaint  Patient presents with   Routine Post Op    "It's sore."    DOS: 05/06/2023 Procedure: Left second toe amputation  82 y.o. female returns for post-op check.   Review of Systems: Negative except as noted in the HPI. Denies N/V/F/Ch.   Objective:   Vitals:   06/01/23 0929  BP: (!) 111/56  Pulse: 60   There is no height or weight on file to calculate BMI. Constitutional Well developed. Well nourished.  Vascular Foot warm and well perfused. Capillary refill normal to all digits.  Calf is soft and supple, no posterior calf or knee pain, negative Homans' sign  Neurologic Normal speech. Epicritic sensation to light touch grossly reduced bilaterally.  Dermatologic Delayed incisional healing in the central portions, only small areas at the suture sites that have not healed  Orthopedic: Tenderness to palpation noted about the surgical site.    Assessment:   1. Delayed surgical wound healing, initial encounter    Plan:  Patient was evaluated and treated and all questions answered.  S/p foot surgery left -Progressing as expected post-operatively.  Sutures removed today.  There is some areas of small delayed incisional healing.  They should change the bandage daily with Betadine and gauze.  Return in 2 weeks for final evaluation   Return in about 2 weeks (around 06/15/2023) for wound check .

## 2023-06-07 ENCOUNTER — Telehealth (INDEPENDENT_AMBULATORY_CARE_PROVIDER_SITE_OTHER): Payer: Self-pay

## 2023-06-07 NOTE — Telephone Encounter (Signed)
We didn't stop or hold any of those medications, that was done by the hospitalist.  Typically she would need to see her PCP for a med rec after hospital d/c

## 2023-06-07 NOTE — Telephone Encounter (Signed)
[name] from [facility] called stating [name] just returned from the hospital a few days ago and she was discontinued from a lot of her medications. Vascular and Podiatry has placed her in for a stent placement; however she was wondering should she make an appointment for [facility] to be seen so, she could possibly restart any of those medications if needed.   Please advise

## 2023-06-08 NOTE — Telephone Encounter (Signed)
I had a discussion with the nurse at the facility.  I have indicated to them that we will months they are sure that her GI bleed is stable that she is okay from our perspective to begin her Plavix and aspirin.  Ultimately this determination is left with the primary care provider or she has a gastroenterologist.  They are advised to contact us if she has any symptoms or issues of lower extremity ischemia.

## 2023-06-15 ENCOUNTER — Ambulatory Visit: Payer: Medicare Other | Admitting: Podiatry

## 2023-06-15 ENCOUNTER — Encounter: Payer: Self-pay | Admitting: Podiatry

## 2023-06-15 VITALS — BP 149/69 | HR 77

## 2023-06-15 DIAGNOSIS — T8189XD Other complications of procedures, not elsewhere classified, subsequent encounter: Secondary | ICD-10-CM | POA: Diagnosis not present

## 2023-06-15 NOTE — Progress Notes (Signed)
  Subjective:  Patient ID: [name], female    DOB: 02/11/1941,  MRN: [id number]  Chief Complaint  Patient presents with   Routine Post Op    DOS: 05/06/2023 Left second toe amputation "It's fine, I think I can take this boot off."     DOS: 05/06/2023 Procedure: Left second toe amputation  82 y.o. female returns for post-op check.  She says it is doing well  Review of Systems: Negative except as noted in the HPI. Denies N/V/F/Ch.   Objective:   Vitals:   06/15/23 0953  BP: (!) 149/69  Pulse: 77   There is no height or weight on file to calculate BMI. Constitutional Well developed. Well nourished.  Vascular Foot warm and well perfused. Capillary refill normal to all digits.  Calf is soft and supple, no posterior calf or knee pain, negative Homans' sign  Neurologic Normal speech. Epicritic sensation to light touch grossly reduced bilaterally.  Dermatologic Incision is healed there is no sign of infection she does have excoriations of the lower leg  Orthopedic: Tenderness to palpation noted about the surgical site.    Assessment:   1. Delayed surgical wound healing, subsequent encounter    Plan:  Patient was evaluated and treated and all questions answered.  S/p foot surgery left -Her incision has healed and she may return to regular shoe gear and bathing.  Her lower leg had some new excoriations that she said she developed last night.  I dressed with a bandage and recommend that the wound care nurse at the facility continue to manage these.  Return to see me as needed for her foot issues.   Return if symptoms worsen or fail to improve.

## 2023-09-22 NOTE — Progress Notes (Signed)
 GASTROENTEROLOGY INITIAL VISIT   Patient Profile: [name] is a 83 y.o. female who presents to the GI Clinic for consultation at the request of Dr. Aundria for evaluation of pancreatic lesion.  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  Patient had expressed some interest after initially refusing reimaging of the pancreas for history of pancreatic cysts.  These were noted on CT scan obtained on October 22, 2012: IMPRESSION:  1. No acute findings in the abdomen or pelvis.  2. Multiple small cystic structures in the pancreas as described above.  Differential would include multiple pancreatic pseudocysts as well as  pancreatic cystic neoplasms. Clinical correlation is recommended for  history  of pancreatitis. At a minimum, followup contrast enhanced CT is  recommended  to ensure stability.  3. Mild basilar opacities and be secondary to atelectasis. Infection or  aspiration are not excluded.  4. Indeterminate 3 mm nodule in the right lower lobe. If the patient is at  low risk for lung cancer, no further follow-up is recommended.  If the  patient is at high risk for lung cancer, recommend 12 month follow-up  noncontrast chest CT.  The patient denies symptoms of heartburn, waterbrash, eructation, dysphagia or dyspepsia. Patient denies abdominal pain, change in bowel habits, rectal bleeding or involuntary weight loss.  Patient denies steatorrhea, nausea, vomiting or unintentional weight loss. Problem List: Problem List  Date Reviewed: 05/03/2019          Noted   Cyst of pancreas (HHS-HCC) 09/22/2023   DNR (do not resuscitate) 09/22/2023   GERD (gastroesophageal reflux disease) 12/19/2020   Bipolar disorder (CMS/HHS-HCC) 12/15/2020   Pressure injury of skin 09/25/2019   Closed  intracapsular fracture of left femur (CMS/HHS-HCC) 09/24/2019   Numbness and tingling in right hand 03/21/2019   Right hand weakness 11/08/2018    Medications: Current Outpatient Medications  Medication Sig Dispense Refill  . acetaminophen  (TYLENOL ) 500 mg capsule Take by mouth every 4 (four) hours as needed for Pain    . aspirin  81 MG EC tablet Take 81 mg by mouth once daily    . atorvastatin  (LIPITOR) 10 MG tablet Take 1 tablet by mouth once daily    . cholecalciferol  (CHOLECALCIFEROL ) 1,000 unit tablet Take by mouth    . clopidogreL  (PLAVIX ) 75 mg tablet Take 75 mg by mouth daily with breakfast    . ferrous sulfate  325 (65 FE) MG tablet Take 325 mg by mouth daily with breakfast    . lamoTRIgine  (LAMICTAL ) 25 MG tablet Take 25 mg by mouth 2 (two) times daily    . melatonin 5 mg Tab Take by mouth at bedtime    . OLANZapine  (ZYPREXA ) 5 MG tablet Take 5 mg by mouth nightly    . omeprazole (PRILOSEC) 20 MG DR capsule Take 1 capsule by mouth once daily    . ondansetron  (ZOFRAN -ODT) 4 MG disintegrating tablet Take 4 mg by mouth every 6 (six) hours as needed for Nausea    . polyethylene glycol (MIRALAX ) powder Take 17 g by mouth once daily Mix in 4-8ounces of fluid prior to taking.    . traZODone  (DESYREL ) 100 MG tablet Take 100 mg by mouth at bedtime    . aluminum-magnesium  hydroxide-simethicone, DRAH, (MI-ACID) 200-200-20 mg/5 mL suspension Take by mouth every 6 (  six) hours as needed for Indigestion Take 30 ml as needed. (Patient not taking: Reported on 09/22/2023)    . cholecalciferol  (VITAMIN D3) 1000 unit tablet Take 1,000 Units by mouth once daily    . divalproex  (DEPAKOTE ) 125 MG DR tablet Take 125 mg by mouth 2 (two) times daily (Patient not taking: Reported on 09/22/2023)    . enoxaparin  (LOVENOX ) 40 mg/0.4 mL injection syringe Inject 0.4 mLs subcutaneously once daily (Patient not taking: Reported on 09/22/2023)    . eszopiclone (LUNESTA) 2 MG tablet Take 2 mg by mouth nightly Take immediately  before bedtime. (Patient not taking: Reported on 09/22/2023)    . guaiFENesin  (MUCINEX ) 100 mg/5 mL solution Take by mouth every 6 (six) hours as needed for Cough (Patient not taking: Reported on 09/22/2023)    . HYDROcodone -acetaminophen  (NORCO) 5-325 mg tablet Take 1-2 tablets by mouth every 4 (four) hours as needed (Patient not taking: Reported on 09/22/2023)    . lactulose  (ENULOSE ) 10 gram/15 mL oral solution Take 45 mLs by mouth once daily (Patient not taking: Reported on 09/22/2023)    . lidocaine  (LIDODERM ) 5 % patch Place 1 patch onto the skin daily Apply patch to the most painful area for up to 12 hours in a 24 hour period. (Patient not taking: Reported on 09/22/2023)    . loperamide (IMODIUM) 2 mg capsule Take 2 mg by mouth 4 (four) times daily as needed for Diarrhea Take 1 cap by mouth as needed with each loose stool    . LORazepam (ATIVAN) 1 MG tablet Take 0.5 mg by mouth nightly as needed for Anxiety Take 0.5 tab at bedtime as needed for sleep malfunction (Patient not taking: Reported on 09/22/2023)    . magnesium  hydroxide (MILK OF MAGNESIA) 400 mg/5 mL suspension Take by mouth once daily as needed for Constipation Take 30 ml at bedtime as needed for constipation (Patient not taking: Reported on 09/22/2023)    . menthol /zinc oxide (CALMOSEPTINE TOP) Apply topically (Patient not taking: Reported on 09/22/2023)    . menthol /zinc oxide (RISAMINE TOP) Apply topically Apply to affected areas as needed for skin irritation. (Patient not taking: Reported on 09/22/2023)    . mirtazapine  (REMERON ) 30 MG tablet Take 30 mg by mouth nightly (Patient not taking: Reported on 09/22/2023)    . neomycin-bacitracnZn-polymyxnB 3.5-500-10,000 mg-unit-unit Oint Apply 1 Application topically 4 (four) times daily as needed (Patient not taking: Reported on 09/22/2023)    . neomycin/bacitracin/polymyxinB (TRIPLE ANTIBIOTIC TOP) Apply topically For minor skin tears. STANDING ORDER (Patient not taking: Reported on 09/22/2023)    .  rivastigmine  tartrate (EXELON ) 3 MG capsule Take 3 mg by mouth 2 (two) times daily with meals (Patient not taking: Reported on 09/22/2023)    . sennosides-docusate (SENOKOT-S) 8.6-50 mg tablet Take 2 tablets by mouth once daily (Patient not taking: Reported on 09/22/2023)    . sertraline  (ZOLOFT ) 100 MG tablet Take 100 mg by mouth once daily (Patient not taking: Reported on 09/22/2023)    . sulfamethoxazole-trimethoprim (BACTRIM DS) 800-160 mg tablet Take 1 tablet by mouth 2 (two) times daily (Patient not taking: Reported on 09/22/2023)    . vortioxetine  (TRINTELLIX ) 20 mg tablet Take 1 tablet by mouth once daily (Patient not taking: Reported on 09/22/2023)    . zinc sulfate 220 mg Tab Take 220 mg by mouth once as needed (Patient not taking: Reported on 09/22/2023)     No current facility-administered medications for this visit.    Allergies: Patient has no known allergies.  Past Medical History: Past Medical History:  Diagnosis Date  . Depression   . Short-term memory loss     Past Surgical History: Past Surgical History:  Procedure Laterality Date  .  Left hip unipolar hemiarthroplasty Left 09/22/2019   Dr.[name]   . TONSILLECTOMY       Family History: Family History  Problem Relation Name Age of Onset  . No Known Problems Mother       Social History: Social History   Socioeconomic History  . Marital status: Widowed  Tobacco Use  . Smoking status: Former  . Smokeless tobacco: Never  Vaping Use  . Vaping status: Never Used  Substance and Sexual Activity  . Alcohol use: Never  . Drug use: Never  . Sexual activity: Not Currently   Social Drivers of Health   Financial Resource Strain: Low Risk  (09/22/2023)   Overall Financial Resource Strain (CARDIA)   . Difficulty of Paying Living Expenses: Not hard at all  Food Insecurity: No Food Insecurity (09/22/2023)   Hunger Vital Sign   . Worried About Programme Researcher, Broadcasting/film/video in the Last Year: Never true   . Ran Out of Food in the Last  Year: Never true  Transportation Needs: No Transportation Needs (09/22/2023)   PRAPARE - Transportation   . Lack of Transportation (Medical): No   . Lack of Transportation (Non-Medical): No     General Review of Systems   General: negative for - fever, chills, weight gain, weight loss, fatigue, weakness, depression, anxiety Head: no injury, migraines, headaches Eyes: no jaundice, itching, dryness, tearing, redness, vision changes Nose: no injury, bleeding Mouth/Throat: no oral ulcers, swollen neck, dry mouth, sore throat, hoarseness Endocrine: no heat/cold intolerance Respiratory: no cough, wheezing, SOB Cardiovascular: no chest pain, palpitations GI: see HPI Musculoskeletal: no joint swelling, muscle/joint pain  Neurological: no seizures, syncope, dizziness, numbness/tingling Skin: no rashes, itching Hematological and Lymphatic: easy bruising, easy bleeding    Physical Examination   Vitals:   09/22/23 1354  BP: 109/46  Pulse: 81  Temp: 36.2 C (97.1 F)   Body mass index is 16.29 kg/m.      Wt Readings from Last 3 Encounters:  10/19/19 47.2 kg (104 lb)  11/07/18 53.4 kg (117 lb 12.8 oz)    General Appearance:      Patient in no distress, but clearly underweight sitting comfortably on exam   room bench.   Head:     Atraumatic, normocephalic  Eyes:   Anicteric    Neck:    No lymphadenopathy noted, symmetrical, no JVD  Mouth:   Lips, mucosa, and tongue normal;   Lungs:     Clear to auscultation bilaterally, no wheezes/crackles/rales   Heart:    Regular rate and rhythm, S1 and S2 normal, no murmur, rub   or gallop  Abdomen:    Rectal:     Soft, non-tender, scaphoid, bowel sounds active all four quadrants,     no rebound/guarding, no masses, no organomegaly.      Deferred  Extremities:   Extremities normal, atraumatic, no cyanosis or edema  Skin:   No abdominal rashes or lesions noted   Neurologic:   Moves all extremities well. Alert and oriented x 3.    Review of Data   I have reviewed the following data today:  Provider notes:   Labs: No visits with results within 3 Month(s) from this visit.  Latest known visit with results is:  No results found for any previous visit.  Imaging:   Procedures:   Also see HPI.  Assessment/Plan   Ms. Nouri is a 83 y.o. female with a PMHx of dementia was seen today for evaluation of:   Orders   Diagnoses and all orders for this visit:  Cyst of pancreas (HHS-HCC) -     Basic Metabolic Panel (BMP) -     CT abdomen pelvis with contrast; Future  DNR (do not resuscitate)  Bipolar affective disorder, remission status unspecified (CMS/HHS-HCC)  Gastroesophageal reflux disease, unspecified whether esophagitis present  Will recheck status of pancreatic cysts/possible neoplasm.   Return if symptoms worsen or fail to improve.     [facility] [address] [place], [place] [address] [phone]   This note will be shared with the patient. To hide, you must now click the Share button.

## 2023-09-23 ENCOUNTER — Other Ambulatory Visit: Payer: Self-pay | Admitting: Internal Medicine

## 2023-09-23 DIAGNOSIS — K862 Cyst of pancreas: Secondary | ICD-10-CM

## 2023-09-26 ENCOUNTER — Ambulatory Visit: Admission: RE | Admit: 2023-09-26 | Payer: Medicare Other | Source: Ambulatory Visit

## 2023-11-22 ENCOUNTER — Other Ambulatory Visit (INDEPENDENT_AMBULATORY_CARE_PROVIDER_SITE_OTHER): Payer: Self-pay | Admitting: Vascular Surgery

## 2023-11-22 DIAGNOSIS — I70213 Atherosclerosis of native arteries of extremities with intermittent claudication, bilateral legs: Secondary | ICD-10-CM

## 2023-11-23 ENCOUNTER — Ambulatory Visit (INDEPENDENT_AMBULATORY_CARE_PROVIDER_SITE_OTHER): Payer: Medicare Other

## 2023-11-23 ENCOUNTER — Ambulatory Visit (INDEPENDENT_AMBULATORY_CARE_PROVIDER_SITE_OTHER): Payer: Medicare Other | Admitting: Nurse Practitioner

## 2023-11-23 ENCOUNTER — Encounter (INDEPENDENT_AMBULATORY_CARE_PROVIDER_SITE_OTHER): Payer: Self-pay | Admitting: Nurse Practitioner

## 2023-11-23 ENCOUNTER — Ambulatory Visit (INDEPENDENT_AMBULATORY_CARE_PROVIDER_SITE_OTHER)

## 2023-11-23 VITALS — BP 155/72 | HR 58 | Resp 16 | Ht 67.0 in | Wt 120.0 lb

## 2023-11-23 DIAGNOSIS — E785 Hyperlipidemia, unspecified: Secondary | ICD-10-CM

## 2023-11-23 DIAGNOSIS — I70213 Atherosclerosis of native arteries of extremities with intermittent claudication, bilateral legs: Secondary | ICD-10-CM

## 2023-11-23 DIAGNOSIS — I70221 Atherosclerosis of native arteries of extremities with rest pain, right leg: Secondary | ICD-10-CM

## 2023-11-23 DIAGNOSIS — K219 Gastro-esophageal reflux disease without esophagitis: Secondary | ICD-10-CM

## 2023-11-23 NOTE — Progress Notes (Signed)
 Subjective:    Patient ID: [name], female    DOB: 1941/09/03, 83 y.o.   MRN: [id number] Chief Complaint  Patient presents with   Follow-up    6 month abi/LEA    The patient is an 83 year old female who returns today for follow-up of her noninvasive studies of her bilateral lower extremities.  Last year she had intervention on her left lower extremity.  Currently she denies open wounds or ulcerations.  However she does note having pain in her right leg.  The pain in the right leg is worse with laying in bed at night.  She does not walk significantly and so she does not have any significant claudication symptoms.  Today her noninvasive study showed an ABI of 0.86 on the right and 1.10 on the left.  TBI's are not done because patient would not allow them to be done.  She also had arterial duplexes done which showed patent stents in the left lower extremity however the mid to distal SFA is occluded with a 75 to 99% stenosis of the proximal SFA.  There is some collateralization to the popliteal artery.    Review of Systems     Objective:   Physical Exam  BP (!) 155/72   Pulse (!) 58   Resp 16   Ht 5\' 7"  (1.702 m)   Wt 120 lb (54.4 kg)   BMI 18.79 kg/m   Past Medical History:  Diagnosis Date   Alzheimer's dementia (HCC)    Insomnia    Low back pain    Vomiting     Social History   Socioeconomic History   Marital status: Divorced    Spouse name: Not on file   Number of children: Not on file   Years of education: Not on file   Highest education level: Not on file  Occupational History   Not on file  Tobacco Use   Smoking status: Former   Smokeless tobacco: Never  Substance and Sexual Activity   Alcohol use: Not Currently   Drug use: Not Currently   Sexual activity: Not on file  Other Topics Concern   Not on file  Social History Narrative   Not on file   Social Drivers of Health   Financial Resource Strain: Low Risk  (09/22/2023)   Received from [facility]   Overall Financial Resource Strain (CARDIA)    Difficulty of Paying Living Expenses: Not hard at all  Food Insecurity: No Food Insecurity (09/22/2023)   Received from [facility]   Hunger Vital Sign    Worried About Running Out of Food in the Last Year: Never true    Ran Out of Food in the Last Year: Never true  Transportation Needs: No Transportation Needs (09/22/2023)   Received from [facility] - Transportation    In the past 12 months, has lack of transportation kept you from medical appointments or from getting medications?: No    Lack of Transportation (Non-Medical): No  Physical Activity: Not on file  Stress: Not on file  Social Connections: Not on file  Intimate Partner Violence: Not on file    Past Surgical History:  Procedure Laterality Date   AMPUTATION TOE Left 05/06/2023   Procedure: AMPUTATION TOE 2nd toe;  Surgeon: Edwin Cap, DPM;  Location: [facility] ORS;  Service: Orthopedics/Podiatry;  Laterality: Left;   HIP ARTHROPLASTY Left 09/22/2019   Procedure: ARTHROPLASTY BIPOLAR HIP (HEMIARTHROPLASTY) RIGHT;  Surgeon:  [name], Excell Seltzer, MD;  Location: [facility] ORS;  Service: Orthopedics;  Laterality: Left;   LOWER EXTREMITY INTERVENTION Left 05/04/2023   Procedure: LOWER EXTREMITY INTERVENTION;  Surgeon: Renford Dills, MD;  Location: [facility] INVASIVE CV LAB;  Service: Cardiovascular;  Laterality: Left;   VAGINAL HYSTERECTOMY      History reviewed. No pertinent family history.  No Known Allergies     Latest Ref Rng & Units 05/09/2023    5:20 AM 05/08/2023    5:10 AM 05/07/2023    5:10 AM  CBC  WBC 4.0 - 10.5 K/uL 9.7  10.0  12.0   Hemoglobin 12.0 - 15.0 g/dL 9.5  8.7  9.6   Hematocrit 36.0 - 46.0 % 30.6  28.5  31.0   Platelets 150 - 400 K/uL 416  381  328       CMP     Component Value Date/Time   NA 140 05/10/2023 0441   NA 143 10/22/2012 0051   K 3.6 05/10/2023 0441   K 3.7 10/22/2012 0051   CL  107 05/10/2023 0441   CL 109 (H) 10/22/2012 0051   CO2 23 05/10/2023 0441   CO2 26 10/22/2012 0051   GLUCOSE 100 (H) 05/10/2023 0441   GLUCOSE 129 (H) 10/22/2012 0051   BUN 22 05/10/2023 0441   BUN 33 (H) 10/22/2012 0051   CREATININE 0.47 05/10/2023 0441   CREATININE 0.66 10/22/2012 0051   CALCIUM 8.8 (L) 05/10/2023 0441   CALCIUM 9.7 10/22/2012 0051   PROT 6.8 04/28/2023 1212   PROT 7.4 10/22/2012 0051   ALBUMIN 3.3 (L) 04/28/2023 1212   ALBUMIN 4.0 10/22/2012 0051   AST 19 04/28/2023 1212   AST 36 10/22/2012 0051   ALT 16 04/28/2023 1212   ALT 36 10/22/2012 0051   ALKPHOS 77 04/28/2023 1212   ALKPHOS 112 10/22/2012 0051   BILITOT 0.6 04/28/2023 1212   BILITOT 0.3 10/22/2012 0051   GFRNONAA >60 05/10/2023 0441   GFRNONAA >60 10/22/2012 0051     No results found.     Assessment & Plan:   1. Atherosclerosis of native artery of right lower extremity with rest pain (HCC) (Primary) Recommend:  The patient has evidence of severe atherosclerotic changes of both lower extremities with rest pain that is associated with preulcerative changes and impending tissue loss of the right foot.  This represents a limb threatening ischemia and places the patient at the risk for right limb loss.  Patient should undergo angiography of the right lower extremity with the hope for intervention for limb salvage.  The risks and benefits as well as the alternative therapies was discussed in detail with the patient.  All questions were answered.  Patient agrees to proceed with right lower extremity angiography.  Patient does have a legal guardian through DSS.  Her guardian was contacted today and left message to call back so that we can obtain consent for the patient.  We contacted Louanne Skye at [phone]  The patient will follow up with me in the office after the procedure.      2. Hyperlipidemia, unspecified hyperlipidemia type Continue statin as ordered and reviewed, no changes at this  time  3. Gastroesophageal reflux disease, unspecified whether esophagitis present Continue PPI as already ordered, this medication has been reviewed and there are no changes at this time.  Avoidence of caffeine and alcohol  Moderate elevation of the head of the bed    Current Outpatient Medications on File Prior to Visit  Medication Sig  Dispense Refill   acetaminophen (TYLENOL) 500 MG tablet Take 500 mg by mouth every 4 (four) hours as needed for mild pain or fever.      aspirin EC 81 MG tablet Take 1 tablet (81 mg total) by mouth daily. Swallow whole.     atorvastatin (LIPITOR) 10 MG tablet Take 1 tablet (10 mg total) by mouth daily.     cholecalciferol (VITAMIN D3) 25 MCG (1000 UT) tablet Take 1,000 Units by mouth daily.     feeding supplement, ENSURE ENLIVE, (ENSURE ENLIVE) LIQD Take 237 mLs by mouth 2 (two) times daily between meals. 237 mL 12   ferrous sulfate 325 (65 FE) MG tablet Take 1 tablet (325 mg total) by mouth daily with breakfast.     lamoTRIgine (LAMICTAL) 25 MG tablet Take 25 mg by mouth 2 (two) times daily.     melatonin 5 MG TABS Take 5 mg by mouth at bedtime.     Multiple Vitamins-Minerals (MULTIVITAMIN ADULT, MINERALS,) TABS Take 1 tablet by mouth daily.     OLANZapine (ZYPREXA) 5 MG tablet Take 5 mg by mouth at bedtime.     omeprazole (PRILOSEC) 20 MG capsule Take 20 mg by mouth daily.     ondansetron (ZOFRAN) 4 MG tablet Take 4 mg by mouth every 8 (eight) hours as needed.     senna-docusate (SENOKOT-S) 8.6-50 MG tablet Take 2 tablets by mouth 2 (two) times daily.     sertraline (ZOLOFT) 100 MG tablet Take 100 mg by mouth daily.     traZODone (DESYREL) 100 MG tablet Take 100 mg by mouth at bedtime.     clonazePAM (KLONOPIN) 0.5 MG tablet Take 0.5 mg by mouth every 12 (twelve) hours as needed for anxiety. (Patient not taking: Reported on 05/26/2023)     clopidogrel (PLAVIX) 75 MG tablet Take 1 tablet (75 mg total) by mouth daily with breakfast.     oxyCODONE (OXY  IR/ROXICODONE) 5 MG immediate release tablet Take 1 tablet (5 mg total) by mouth every 4 (four) hours as needed for moderate pain or severe pain. (Patient not taking: Reported on 11/23/2023) 30 tablet 0   polyethylene glycol (MIRALAX / GLYCOLAX) 17 g packet Take 34 g by mouth daily. (Patient not taking: Reported on 11/23/2023)     No current facility-administered medications on file prior to visit.    There are no Patient Instructions on file for this visit. No follow-ups on file.   Georgiana Spinner, NP

## 2023-11-23 NOTE — Progress Notes (Incomplete)
 Subjective:    Patient ID: [name], female    DOB: November 22, 1940, 83 y.o.   MRN: [id number] Chief Complaint  Patient presents with  . Follow-up    6 month abi/LEA    HPI  Review of Systems     Objective:   Physical Exam  BP (!) 155/72   Pulse (!) 58   Resp 16   Ht 5\' 7"  (1.702 m)   Wt 120 lb (54.4 kg)   BMI 18.79 kg/m   Past Medical History:  Diagnosis Date  . Alzheimer's dementia (HCC)   . Insomnia   . Low back pain   . Vomiting     Social History   Socioeconomic History  . Marital status: Divorced    Spouse name: Not on file  . Number of children: Not on file  . Years of education: Not on file  . Highest education level: Not on file  Occupational History  . Not on file  Tobacco Use  . Smoking status: Former  . Smokeless tobacco: Never  Substance and Sexual Activity  . Alcohol use: Not Currently  . Drug use: Not Currently  . Sexual activity: Not on file  Other Topics Concern  . Not on file  Social History Narrative  . Not on file   Social Drivers of Health   Financial Resource Strain: Low Risk  (09/22/2023)   Received from [facility]   Overall Financial Resource Strain (CARDIA)   . Difficulty of Paying Living Expenses: Not hard at all  Food Insecurity: No Food Insecurity (09/22/2023)   Received from [facility]   Hunger Vital Sign   . Worried About Programme researcher, broadcasting/film/video in the Last Year: Never true   . Ran Out of Food in the Last Year: Never true  Transportation Needs: No Transportation Needs (09/22/2023)   Received from [facility]   [facility] - Transportation   . In the past 12 months, has lack of transportation kept you from medical appointments or from getting medications?: No   . Lack of Transportation (Non-Medical): No  Physical Activity: Not on file  Stress: Not on file  Social Connections: Not on file  Intimate Partner Violence: Not on file    Past Surgical History:  Procedure  Laterality Date  . AMPUTATION TOE Left 05/06/2023   Procedure: AMPUTATION TOE 2nd toe;  Surgeon: Edwin Cap, DPM;  Location: [facility] ORS;  Service: Orthopedics/Podiatry;  Laterality: Left;  . HIP ARTHROPLASTY Left 09/22/2019   Procedure: ARTHROPLASTY BIPOLAR HIP (HEMIARTHROPLASTY) RIGHT;  Surgeon: Christena Flake, MD;  Location: [facility] ORS;  Service: Orthopedics;  Laterality: Left;  . LOWER EXTREMITY INTERVENTION Left 05/04/2023   Procedure: LOWER EXTREMITY INTERVENTION;  Surgeon: Renford Dills, MD;  Location: [facility] INVASIVE CV LAB;  Service: Cardiovascular;  Laterality: Left;  Marland Kitchen VAGINAL HYSTERECTOMY      History reviewed. No pertinent family history.  No Known Allergies     Latest Ref Rng & Units 05/09/2023    5:20 AM 05/08/2023    5:10 AM 05/07/2023    5:10 AM  CBC  WBC 4.0 - 10.5 K/uL 9.7  10.0  12.0   Hemoglobin 12.0 - 15.0 g/dL 9.5  8.7  9.6   Hematocrit 36.0 - 46.0 % 30.6  28.5  31.0   Platelets 150 - 400 K/uL 416  381  328       CMP     Component Value Date/Time   NA  140 05/10/2023 0441   NA 143 10/22/2012 0051   K 3.6 05/10/2023 0441   K 3.7 10/22/2012 0051   CL 107 05/10/2023 0441   CL 109 (H) 10/22/2012 0051   CO2 23 05/10/2023 0441   CO2 26 10/22/2012 0051   GLUCOSE 100 (H) 05/10/2023 0441   GLUCOSE 129 (H) 10/22/2012 0051   BUN 22 05/10/2023 0441   BUN 33 (H) 10/22/2012 0051   CREATININE 0.47 05/10/2023 0441   CREATININE 0.66 10/22/2012 0051   CALCIUM 8.8 (L) 05/10/2023 0441   CALCIUM 9.7 10/22/2012 0051   PROT 6.8 04/28/2023 1212   PROT 7.4 10/22/2012 0051   ALBUMIN 3.3 (L) 04/28/2023 1212   ALBUMIN 4.0 10/22/2012 0051   AST 19 04/28/2023 1212   AST 36 10/22/2012 0051   ALT 16 04/28/2023 1212   ALT 36 10/22/2012 0051   ALKPHOS 77 04/28/2023 1212   ALKPHOS 112 10/22/2012 0051   BILITOT 0.6 04/28/2023 1212   BILITOT 0.3 10/22/2012 0051   GFRNONAA >60 05/10/2023 0441   GFRNONAA >60 10/22/2012 0051     No results found.     Assessment & Plan:    1. Atherosclerosis of native artery of right lower extremity with rest pain (HCC) (Primary) ***  2. Hyperlipidemia, unspecified hyperlipidemia type ***  3. Gastroesophageal reflux disease, unspecified whether esophagitis present ***   Current Outpatient Medications on File Prior to Visit  Medication Sig Dispense Refill  . acetaminophen (TYLENOL) 500 MG tablet Take 500 mg by mouth every 4 (four) hours as needed for mild pain or fever.     Marland Kitchen aspirin EC 81 MG tablet Take 1 tablet (81 mg total) by mouth daily. Swallow whole.    Marland Kitchen atorvastatin (LIPITOR) 10 MG tablet Take 1 tablet (10 mg total) by mouth daily.    . cholecalciferol (VITAMIN D3) 25 MCG (1000 UT) tablet Take 1,000 Units by mouth daily.    . feeding supplement, ENSURE ENLIVE, (ENSURE ENLIVE) LIQD Take 237 mLs by mouth 2 (two) times daily between meals. 237 mL 12  . ferrous sulfate 325 (65 FE) MG tablet Take 1 tablet (325 mg total) by mouth daily with breakfast.    . lamoTRIgine (LAMICTAL) 25 MG tablet Take 25 mg by mouth 2 (two) times daily.    . melatonin 5 MG TABS Take 5 mg by mouth at bedtime.    . Multiple Vitamins-Minerals (MULTIVITAMIN ADULT, MINERALS,) TABS Take 1 tablet by mouth daily.    Marland Kitchen OLANZapine (ZYPREXA) 5 MG tablet Take 5 mg by mouth at bedtime.    Marland Kitchen omeprazole (PRILOSEC) 20 MG capsule Take 20 mg by mouth daily.    . ondansetron (ZOFRAN) 4 MG tablet Take 4 mg by mouth every 8 (eight) hours as needed.    . senna-docusate (SENOKOT-S) 8.6-50 MG tablet Take 2 tablets by mouth 2 (two) times daily.    . sertraline (ZOLOFT) 100 MG tablet Take 100 mg by mouth daily.    . traZODone (DESYREL) 100 MG tablet Take 100 mg by mouth at bedtime.    . clonazePAM (KLONOPIN) 0.5 MG tablet Take 0.5 mg by mouth every 12 (twelve) hours as needed for anxiety. (Patient not taking: Reported on 05/26/2023)    . clopidogrel (PLAVIX) 75 MG tablet Take 1 tablet (75 mg total) by mouth daily with breakfast.    . oxyCODONE (OXY IR/ROXICODONE) 5  MG immediate release tablet Take 1 tablet (5 mg total) by mouth every 4 (four) hours as needed for moderate pain or  severe pain. (Patient not taking: Reported on 11/23/2023) 30 tablet 0  . polyethylene glycol (MIRALAX / GLYCOLAX) 17 g packet Take 34 g by mouth daily. (Patient not taking: Reported on 11/23/2023)     No current facility-administered medications on file prior to visit.    There are no Patient Instructions on file for this visit. No follow-ups on file.   Georgiana Spinner, NP

## 2023-11-24 LAB — VAS US ABI WITH/WO TBI
Left ABI: 1.1
Right ABI: 0.86

## 2024-02-23 ENCOUNTER — Ambulatory Visit (INDEPENDENT_AMBULATORY_CARE_PROVIDER_SITE_OTHER): Admitting: Vascular Surgery

## 2024-03-06 NOTE — Progress Notes (Signed)
 MRN : [id number]  [name] is a 83 y.o. (11/02/1940) female who presents with chief complaint of check circulation.  History of Present Illness:   The patient returns to the office for followup of atherosclerotic changes of the lower extremities and review of the noninvasive studies.   The patient notes that there has been a significant deterioration in the lower extremity symptoms.  The patient notes interval shortening of their claudication distance and development of mild rest pain symptoms. No new ulcers or wounds have occurred since the last visit.  There have been no significant changes to the patient's overall health care.  The patient denies amaurosis fugax or recent TIA symptoms. There are no recent neurological changes noted. There is no history of DVT, PE or superficial thrombophlebitis. The patient denies recent episodes of angina or shortness of breath.   ABI's Rt=0.86 and Lt=1.10 (patient refused toe tracings) Duplex US  of the lower extremity arterial system shows patent stents in the left lower extremity however the popliteal artery appears to have a high-grade stenosis with conversion to monophasic signals. There is some collateralization to the popliteal artery.   Past Medical History:  Diagnosis Date   Alzheimer's dementia (HCC)    Insomnia    Low back pain    Vomiting     Past Surgical History:  Procedure Laterality Date   AMPUTATION TOE Left 05/06/2023   Procedure: AMPUTATION TOE 2nd toe;  Surgeon: Silva Juliene SAUNDERS, DPM;  Location: [facility] ORS;  Service: Orthopedics/Podiatry;  Laterality: Left;   HIP ARTHROPLASTY Left 09/22/2019   Procedure: ARTHROPLASTY BIPOLAR HIP (HEMIARTHROPLASTY) RIGHT;  Surgeon: Edie Norleen PARAS, MD;  Location: [facility] ORS;  Service: Orthopedics;  Laterality: Left;   LOWER EXTREMITY INTERVENTION Left 05/04/2023   Procedure: LOWER EXTREMITY INTERVENTION;  Surgeon: Jama Cordella MATSU, MD;  Location: [facility] INVASIVE CV LAB;  Service: Cardiovascular;  Laterality: Left;   VAGINAL HYSTERECTOMY      Social History Social History   Tobacco Use   Smoking status: Former   Smokeless tobacco: Never  Substance Use Topics   Alcohol use: Not Currently   Drug use: Not Currently    Family History No family history on file.  No Known Allergies   REVIEW OF SYSTEMS (Negative unless checked)  Constitutional: [] Weight loss  [] Fever  [] Chills Cardiac: [] Chest pain   [] Chest pressure   [] Palpitations   [] Shortness of breath when laying flat   [] Shortness of breath with exertion. Vascular:  [x] Pain in legs with walking   [] Pain in legs at rest  [] History of DVT   [] Phlebitis   [] Swelling in legs   [] Varicose veins   [] Non-healing ulcers Pulmonary:   [] Uses home oxygen   [] Productive cough   [] Hemoptysis   [] Wheeze  [] COPD   [] Asthma Neurologic:  [] Dizziness   [] Seizures   [] History of stroke   [] History of TIA  [] Aphasia   [] Vissual changes   [] Weakness or numbness in arm   [] Weakness or numbness in leg Musculoskeletal:   [] Joint swelling   [] Joint pain   [] Low back pain Hematologic:  [] Easy bruising  []   Easy bleeding   [] Hypercoagulable state   [] Anemic Gastrointestinal:  [] Diarrhea   [] Vomiting  [x] Gastroesophageal reflux/heartburn   [] Difficulty swallowing. Genitourinary:  [] Chronic kidney disease   [] Difficult urination  [] Frequent urination   [] Blood in urine Skin:  [] Rashes   [] Ulcers  Psychological:  [] History of anxiety   []  History of major depression.  Physical Examination  There were no vitals filed for this visit. There is no height or weight on file to calculate BMI. Gen: WD/WN, NAD Head: [place]/AT, No temporalis wasting.  Ear/Nose/Throat: Hearing grossly intact, nares w/o erythema or drainage Eyes: PER, EOMI, sclera nonicteric.  Neck: Supple, no masses.  No bruit or JVD.  Pulmonary:  Good air movement, no audible wheezing, no use of accessory muscles.   Cardiac: RRR, normal S1, S2, no Murmurs. Vascular:  mild trophic changes, open wounds left foot Vessel Right Left  Radial Palpable Palpable  PT Not Palpable Not Palpable  DP Not Palpable Not Palpable  Gastrointestinal: soft, non-distended. No guarding/no peritoneal signs.  Musculoskeletal: M/S 5/5 throughout.  No visible deformity.  Neurologic: CN 2-12 intact. Pain and light touch intact in extremities.  Symmetrical.  Speech is fluent. Motor exam as listed above. Psychiatric: Judgment intact, Mood & affect appropriate for pt's clinical situation. Dermatologic: No rashes or ulcers noted.  No changes consistent with cellulitis.   CBC Lab Results  Component Value Date   WBC 9.7 05/09/2023   HGB 9.5 (L) 05/09/2023   HCT 30.6 (L) 05/09/2023   MCV 92.7 05/09/2023   PLT 416 (H) 05/09/2023    BMET    Component Value Date/Time   NA 140 05/10/2023 0441   NA 143 10/22/2012 0051   K 3.6 05/10/2023 0441   K 3.7 10/22/2012 0051   CL 107 05/10/2023 0441   CL 109 (H) 10/22/2012 0051   CO2 23 05/10/2023 0441   CO2 26 10/22/2012 0051   GLUCOSE 100 (H) 05/10/2023 0441   GLUCOSE 129 (H) 10/22/2012 0051   BUN 22 05/10/2023 0441   BUN 33 (H) 10/22/2012 0051   CREATININE 0.47 05/10/2023 0441   CREATININE 0.66 10/22/2012 0051   CALCIUM  8.8 (L) 05/10/2023 0441   CALCIUM  9.7 10/22/2012 0051   GFRNONAA >60 05/10/2023 0441   GFRNONAA >60 10/22/2012 0051   GFRAA >60 02/29/2020 1134   GFRAA >60 10/22/2012 0051   CrCl cannot be calculated (Patient's most recent lab result is older than the maximum 21 days allowed.).  COAG No results found for: INR, PROTIME  Radiology No results found.   Assessment/Plan 1. Atherosclerosis of native arteries of the extremities with ulceration (HCC)  Recommend:  The patient has evidence of severe atherosclerotic changes of both lower extremities associated with ulceration and tissue loss of the left foot.  This represents a limb threatening ischemia  and places the patient at the risk for left limb loss.  Patient should undergo angiography of the left lower extremity with the hope for intervention for limb salvage.  The risks and benefits as well as the alternative therapies was discussed in detail with the patient.  All questions were answered.  Patient agrees to proceed with left angiography.  The patient will follow up with me in the office after the procedure.   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 Gastroesophageal reflux  disease, unspecified whether esophagitis present (Primary) Continue PPI as already ordered, this medication has been reviewed and there are no changes at this time.  Avoidence of caffeine and alcohol  Moderate elevation of the head of the bed   3. Hyperlipidemia, unspecified hyperlipidemia type Continue statin as ordered and reviewed, no changes at this time  4. 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

## 2024-03-08 ENCOUNTER — Ambulatory Visit (INDEPENDENT_AMBULATORY_CARE_PROVIDER_SITE_OTHER): Admitting: Vascular Surgery

## 2024-03-08 VITALS — BP 101/83 | HR 88 | Ht 67.0 in | Wt 120.0 lb

## 2024-03-08 DIAGNOSIS — K219 Gastro-esophageal reflux disease without esophagitis: Secondary | ICD-10-CM | POA: Diagnosis not present

## 2024-03-08 DIAGNOSIS — I7025 Atherosclerosis of native arteries of other extremities with ulceration: Secondary | ICD-10-CM | POA: Diagnosis not present

## 2024-03-08 DIAGNOSIS — E785 Hyperlipidemia, unspecified: Secondary | ICD-10-CM

## 2024-03-08 DIAGNOSIS — M869 Osteomyelitis, unspecified: Secondary | ICD-10-CM

## 2024-03-08 DIAGNOSIS — I70213 Atherosclerosis of native arteries of extremities with intermittent claudication, bilateral legs: Secondary | ICD-10-CM

## 2024-03-11 ENCOUNTER — Encounter (INDEPENDENT_AMBULATORY_CARE_PROVIDER_SITE_OTHER): Payer: Self-pay | Admitting: Vascular Surgery

## 2024-03-11 DIAGNOSIS — I7025 Atherosclerosis of native arteries of other extremities with ulceration: Secondary | ICD-10-CM | POA: Insufficient documentation

## 2024-03-15 ENCOUNTER — Telehealth (INDEPENDENT_AMBULATORY_CARE_PROVIDER_SITE_OTHER): Payer: Self-pay

## 2024-03-15 NOTE — Telephone Encounter (Signed)
 Spoke with Rod at [facility] to schedule the patient for a left leg angio with Dr. Jama. Patient is on 04/24/24 with a 9:00 am arrival time to the [facility]. Pre-procedure instructions were discussed and will be faxed to attention Rod at [facility]. Rod was offered 04/10/24 and declined, the patient had an appt on that day.

## 2024-04-02 NOTE — Telephone Encounter (Signed)
 I called patient about her most recent lab results. The number is for [facility] where patient resides. However, the nurse did not answer, I left her a voicemail with patient name and date of birth and asked for her to give me a call back.

## 2024-04-03 NOTE — Telephone Encounter (Signed)
 I called [facility], where the patient currently resides. The phone line was busy and received no answer. I will be sending labs through the mail to [facility] for them to look over for patient.

## 2024-04-10 NOTE — Progress Notes (Signed)
 [facility] Follow Up Visit   Patient Name: [name], female   Patient DOB: Apr 01, 1941 Date of Service: 04/10/2024  Patient MRN: [id number] Provider Creating Note: Woodward Brought, MD  [phone] Primary Care Physician: No primary care provider on file.   [facility] [address] [place] [address] Additional Physicians/ Providers:   Impression/Recommendations   Ms. [name] is a 83 y.o. female with dementia, bipolar, anxiety, GERD, peripheral vascular disease, tobacco use, and hyperlipidemia who presents as a follow up.   Edema: Proteinuria: Hematuria: Tobacco use:   - Continue furosemide  20mg  daily - elevated feet - discussed smoking cessation  Patient Active Problem List  Diagnosis  . Edema  . Proteinuria  . Hematuria  . Hyperlipidemia    Orders Placed This Encounter  . Renal Function Panel       Return in about 3 months (around 07/11/2024).  Chief Complaint   Chief Complaint  Patient presents with  . Follow-up    History of Present Illness   Ms. [name] presents for follow up. Patient presents with Health aide who assists with history taking. Patient with depression and does not want to participate in her encounter today.   On last visit, patient was started on furosemide . She states her swelling has improved. She continues to have a chronic left wound that is weeping. This is in dressings today.   Patient claims to be taking all her medications as prescribed. No hospitalizations.   No other changes to her medications.     Medications   Current Outpatient Medications:  .  polyethylene glycol (GLYCOLAX ) 17 g packet, Take 34 g by mouth daily., Disp: , Rfl:  .  acetaminophen  (TYLENOL ) 500 MG tablet, Take 500 mg by mouth every 4 (four) hours as needed for mild pain or fever., Disp: , Rfl:  .  aspirin  (ST JOSEPH) 81 MG EC tablet, Take 1 tablet (81 mg total) by mouth daily. Swallow whole., Disp: , Rfl:  .   Cholecalciferol  (Vitamin D ) 25 MCG (1000 UT) tablet, Take 1,000 Units by mouth 1 (one) time each day, Disp: , Rfl:  .  ferrous sulfate  325 (65 Fe) MG tablet, Take 1 tablet (325 mg total) by mouth daily with breakfast., Disp: , Rfl:  .  furosemide  (Lasix ) 20 MG tablet, Take 1 tablet (20 mg total) by mouth 1 (one) time each day, Disp: 30 tablet, Rfl: 3 .  lamoTRIgine  (LaMICtal ) 25 MG tablet, Take 25 mg by mouth 2 (two) times daily., Disp: , Rfl:  .  magnesium  oxide (MAG-OX) 400 MG tablet, Take 400 mg by mouth 1 (one) time each day, Disp: , Rfl:  .  Melatonin 5 MG tablet, Take 5 mg by mouth at bedtime., Disp: , Rfl:  .  Multiple Vitamins-Minerals (Multivitamin Adult, Minerals,) tablet, Take 1 tablet by mouth 1 (one) time each day, Disp: , Rfl:  .  OLANZapine  (ZyPREXA ) 5 MG tablet, Take 5 mg by mouth at bedtime., Disp: , Rfl:  .  omeprazole (PriLOSEC) 20 MG DR capsule, Take 20 mg by mouth 1 (one) time each day, Disp: , Rfl:  .  ondansetron  (ZOFRAN ) 4 MG tablet, Take 4 mg by mouth every 8 (eight) hours as needed., Disp: , Rfl:  .  senna-docusate (PERICOLACE) 8.6-50 MG per tablet, Take 2 tablets by mouth in the morning and 2 tablets in the evening., Disp: , Rfl:  .  sertraline  (ZOLOFT ) 100 MG tablet, Take 100 mg by mouth 1 (  one) time each day, Disp: , Rfl:  .  traZODone  (DESYREL ) 100 MG tablet, Take 100 mg by mouth at bedtime., Disp: , Rfl:    Allergies Patient has no known allergies.  History Past Medical History:  Diagnosis Date  . Alzheimer's disease (HCC)   . Anxiety   . Bipolar disorder (HCC)   . Edema 03/12/2024  . Gastroesophageal reflux disease   . Hematuria 03/12/2024  . Hyperlipidemia   . Osteoarthritis   . Peripheral vascular disease (HCC)   . Proteinuria 03/12/2024  . Tobacco use   . Urinary incontinence   . Vascular dementia, mild (HCC)     Past Surgical History:  Procedure Laterality Date  . HIP ARTHROPLASTY Left 2021   Dr. Edie  . TOE AMPUTATION     left 2nd toe    Family History  Problem Relation Age of Onset  . Stroke Mother   . Hypertension Mother   . Gout Father   . Cancer Father    Social History   Tobacco Use  . Smoking status: Some Days    Types: Cigarettes  . Smokeless tobacco: Never  Substance Use Topics  . Alcohol use: Never     Physical Exam  Vitals BP 135/59 (BP Location: Right upper arm, Patient Position: Sitting)   Pulse 83   Temp 97.8 F   Wt 118 lb (53.5 kg)   SpO2 90%   Vitals reviewed. Constitutional: She is oriented to person, place, and time. She appears well-developed.  HEENT:  Head: Normocephalic and atraumatic. Mouth/Throat: Oropharynx is clear and moist.  Eyes: Pupils are equal, round, and reactive to light.  Neck: Neck supple.  Cardiovascular:  Normal rate and regular rhythm.          She exhibits edema.  Pulmonary/Chest: Effort normal and breath sounds normal.  Abdominal: Soft.  Neurological: She is alert and oriented to person, place, and time.  Skin: Skin is warm and dry.  Left lower leg with wound: +dressings     Laboratory Studies  Chemistry  Lab Units 03/12/24 1033  SODIUM mmol/L 140  POTASSIUM mmol/L 4.3  CHLORIDE mmol/L 103  CO2 mmol/L 25  MAGNESIUM  mg/dL 1.5  CALCIUM  mg/dL 89.9  PHOSPHORUS mg/dL 3.1  ALK PHOS U/L 898  PTH pg/mL 44  GLUCOSE mg/dL 74  ALBUMIN g/dL 3.3*  3.6  BUN mg/dL 23  CREATININE mg/dL 9.27        No lab exists for component: IRON SATURATION, TRANSSATPER  CBC  Lab Units 03/12/24 1033  WBC AUTO [place]/uL 9.6  HEMOGLOBIN g/dL 88.5*  HEMATOCRIT % 61.0  MCV fL 85.3  PLATELETS AUTO [place]/uL 468*    Urine  Lab Units 03/12/24 1033 03/12/24 0955  COLOR UA   --  Yellow  CLARITY UA   --  Clear  KETONES UA   --  Trace  PH UA   --  5.5  UROBILINOGEN UA   --  0.2  PROT/CREAT RATIO UR mg/g creat 0.188*  188*  --         No lab exists for component: CYCLOSPORITR     Woodward Brought, MD  [facility] [place], [place]

## 2024-04-16 ENCOUNTER — Inpatient Hospital Stay

## 2024-04-16 ENCOUNTER — Inpatient Hospital Stay
Admission: EM | Admit: 2024-04-16 | Discharge: 2024-04-26 | DRG: 871 | Disposition: A | Discharge status: Skilled Nursing Facility | Source: Skilled Nursing Facility | Attending: Hospitalist | Admitting: Hospitalist

## 2024-04-16 ENCOUNTER — Encounter: Payer: Self-pay | Admitting: Internal Medicine

## 2024-04-16 ENCOUNTER — Other Ambulatory Visit: Payer: Self-pay

## 2024-04-16 ENCOUNTER — Emergency Department

## 2024-04-16 DIAGNOSIS — G309 Alzheimer's disease, unspecified: Secondary | ICD-10-CM | POA: Diagnosis present

## 2024-04-16 DIAGNOSIS — Z89422 Acquired absence of other left toe(s): Secondary | ICD-10-CM

## 2024-04-16 DIAGNOSIS — Z87891 Personal history of nicotine dependence: Secondary | ICD-10-CM

## 2024-04-16 DIAGNOSIS — D649 Anemia, unspecified: Secondary | ICD-10-CM | POA: Diagnosis present

## 2024-04-16 DIAGNOSIS — Z1152 Encounter for screening for COVID-19: Secondary | ICD-10-CM | POA: Diagnosis not present

## 2024-04-16 DIAGNOSIS — Z638 Other specified problems related to primary support group: Secondary | ICD-10-CM | POA: Diagnosis not present

## 2024-04-16 DIAGNOSIS — J9601 Acute respiratory failure with hypoxia: Secondary | ICD-10-CM | POA: Diagnosis present

## 2024-04-16 DIAGNOSIS — F319 Bipolar disorder, unspecified: Secondary | ICD-10-CM | POA: Diagnosis present

## 2024-04-16 DIAGNOSIS — I70229 Atherosclerosis of native arteries of extremities with rest pain, unspecified extremity: Secondary | ICD-10-CM

## 2024-04-16 DIAGNOSIS — L97909 Non-pressure chronic ulcer of unspecified part of unspecified lower leg with unspecified severity: Secondary | ICD-10-CM

## 2024-04-16 DIAGNOSIS — L03116 Cellulitis of left lower limb: Secondary | ICD-10-CM | POA: Diagnosis present

## 2024-04-16 DIAGNOSIS — K5900 Constipation, unspecified: Secondary | ICD-10-CM | POA: Diagnosis present

## 2024-04-16 DIAGNOSIS — Z7982 Long term (current) use of aspirin: Secondary | ICD-10-CM

## 2024-04-16 DIAGNOSIS — R509 Fever, unspecified: Principal | ICD-10-CM

## 2024-04-16 DIAGNOSIS — K219 Gastro-esophageal reflux disease without esophagitis: Secondary | ICD-10-CM | POA: Diagnosis present

## 2024-04-16 DIAGNOSIS — F0284 Dementia in other diseases classified elsewhere, unspecified severity, with anxiety: Secondary | ICD-10-CM | POA: Diagnosis present

## 2024-04-16 DIAGNOSIS — E785 Hyperlipidemia, unspecified: Secondary | ICD-10-CM | POA: Diagnosis present

## 2024-04-16 DIAGNOSIS — R6520 Severe sepsis without septic shock: Secondary | ICD-10-CM | POA: Diagnosis not present

## 2024-04-16 DIAGNOSIS — F0283 Dementia in other diseases classified elsewhere, unspecified severity, with mood disturbance: Secondary | ICD-10-CM | POA: Diagnosis present

## 2024-04-16 DIAGNOSIS — J189 Pneumonia, unspecified organism: Secondary | ICD-10-CM | POA: Diagnosis present

## 2024-04-16 DIAGNOSIS — A419 Sepsis, unspecified organism: Principal | ICD-10-CM | POA: Diagnosis present

## 2024-04-16 DIAGNOSIS — Z96642 Presence of left artificial hip joint: Secondary | ICD-10-CM | POA: Diagnosis present

## 2024-04-16 DIAGNOSIS — S81802D Unspecified open wound, left lower leg, subsequent encounter: Secondary | ICD-10-CM | POA: Diagnosis not present

## 2024-04-16 DIAGNOSIS — I739 Peripheral vascular disease, unspecified: Secondary | ICD-10-CM | POA: Diagnosis present

## 2024-04-16 DIAGNOSIS — Z79899 Other long term (current) drug therapy: Secondary | ICD-10-CM | POA: Diagnosis not present

## 2024-04-16 DIAGNOSIS — Z66 Do not resuscitate: Secondary | ICD-10-CM | POA: Diagnosis present

## 2024-04-16 DIAGNOSIS — F03918 Unspecified dementia, unspecified severity, with other behavioral disturbance: Secondary | ICD-10-CM | POA: Diagnosis not present

## 2024-04-16 DIAGNOSIS — R0902 Hypoxemia: Secondary | ICD-10-CM | POA: Diagnosis present

## 2024-04-16 DIAGNOSIS — G9341 Metabolic encephalopathy: Secondary | ICD-10-CM | POA: Diagnosis present

## 2024-04-16 DIAGNOSIS — Z7902 Long term (current) use of antithrombotics/antiplatelets: Secondary | ICD-10-CM

## 2024-04-16 DIAGNOSIS — Z789 Other specified health status: Secondary | ICD-10-CM | POA: Diagnosis not present

## 2024-04-16 DIAGNOSIS — E43 Unspecified severe protein-calorie malnutrition: Secondary | ICD-10-CM | POA: Diagnosis present

## 2024-04-16 DIAGNOSIS — Z515 Encounter for palliative care: Secondary | ICD-10-CM | POA: Diagnosis not present

## 2024-04-16 DIAGNOSIS — Z681 Body mass index (BMI) 19 or less, adult: Secondary | ICD-10-CM | POA: Diagnosis not present

## 2024-04-16 LAB — CBC WITH DIFFERENTIAL/PLATELET
Abs Immature Granulocytes: 0.06 K/uL (ref 0.00–0.07)
Basophils Absolute: 0 K/uL (ref 0.0–0.1)
Basophils Relative: 0 %
Eosinophils Absolute: 0.1 K/uL (ref 0.0–0.5)
Eosinophils Relative: 1 %
HCT: 29.3 % — ABNORMAL LOW (ref 36.0–46.0)
Hemoglobin: 9 g/dL — ABNORMAL LOW (ref 12.0–15.0)
Immature Granulocytes: 0 %
Lymphocytes Relative: 15 %
Lymphs Abs: 2.1 K/uL (ref 0.7–4.0)
MCH: 25.6 pg — ABNORMAL LOW (ref 26.0–34.0)
MCHC: 30.7 g/dL (ref 30.0–36.0)
MCV: 83.5 fL (ref 80.0–100.0)
Monocytes Absolute: 1.2 K/uL — ABNORMAL HIGH (ref 0.1–1.0)
Monocytes Relative: 9 %
Neutro Abs: 10.2 K/uL — ABNORMAL HIGH (ref 1.7–7.7)
Neutrophils Relative %: 75 %
Platelets: 499 K/uL — ABNORMAL HIGH (ref 150–400)
RBC: 3.51 MIL/uL — ABNORMAL LOW (ref 3.87–5.11)
RDW: 17 % — ABNORMAL HIGH (ref 11.5–15.5)
WBC: 13.7 K/uL — ABNORMAL HIGH (ref 4.0–10.5)
nRBC: 0 % (ref 0.0–0.2)

## 2024-04-16 LAB — PROTIME-INR
INR: 1 (ref 0.8–1.2)
Prothrombin Time: 14.2 s (ref 11.4–15.2)

## 2024-04-16 LAB — COMPREHENSIVE METABOLIC PANEL WITH GFR
ALT: 16 U/L (ref 0–44)
AST: 23 U/L (ref 15–41)
Albumin: 2.3 g/dL — ABNORMAL LOW (ref 3.5–5.0)
Alkaline Phosphatase: 70 U/L (ref 38–126)
Anion gap: 9 (ref 5–15)
BUN: 20 mg/dL (ref 8–23)
CO2: 25 mmol/L (ref 22–32)
Calcium: 8.7 mg/dL — ABNORMAL LOW (ref 8.9–10.3)
Chloride: 107 mmol/L (ref 98–111)
Creatinine, Ser: 0.65 mg/dL (ref 0.44–1.00)
GFR, Estimated: >60 mL/min (ref >60)
Glucose, Bld: 103 mg/dL — ABNORMAL HIGH (ref 70–99)
Potassium: 3.7 mmol/L (ref 3.5–5.1)
Sodium: 141 mmol/L (ref 135–145)
Total Bilirubin: 0.5 mg/dL (ref 0.0–1.2)
Total Protein: 5.7 g/dL — ABNORMAL LOW (ref 6.5–8.1)

## 2024-04-16 LAB — RESP PANEL BY RT-PCR (RSV, FLU A&B, COVID)  RVPGX2
Influenza A by PCR: NEGATIVE
Influenza B by PCR: NEGATIVE
Resp Syncytial Virus by PCR: NEGATIVE
SARS Coronavirus 2 by RT PCR: NEGATIVE

## 2024-04-16 LAB — LACTIC ACID, PLASMA: Lactic Acid, Venous: 0.9 mmol/L (ref 0.5–1.9)

## 2024-04-16 LAB — MRSA NEXT GEN BY PCR, NASAL: MRSA by PCR Next Gen: NOT DETECTED

## 2024-04-16 MED ORDER — SODIUM CHLORIDE 0.9 % IV BOLUS (SEPSIS)
1000.0000 mL | Freq: Once | INTRAVENOUS | Status: AC
  Administered 2024-04-16: 1000 mL via INTRAVENOUS

## 2024-04-16 MED ORDER — VANCOMYCIN HCL IN DEXTROSE 1-5 GM/200ML-% IV SOLN
1000.0000 mg | Freq: Once | INTRAVENOUS | Status: AC
  Administered 2024-04-16: 1000 mg via INTRAVENOUS
  Filled 2024-04-16: qty 200

## 2024-04-16 MED ORDER — ENOXAPARIN SODIUM 40 MG/0.4ML IJ SOSY
40.0000 mg | PREFILLED_SYRINGE | INTRAMUSCULAR | Status: DC
  Administered 2024-04-16 – 2024-04-25 (×10): 40 mg via SUBCUTANEOUS
  Filled 2024-04-16 (×9): qty 0.4

## 2024-04-16 MED ORDER — SENNOSIDES-DOCUSATE SODIUM 8.6-50 MG PO TABS
2.0000 | ORAL_TABLET | Freq: Two times a day (BID) | ORAL | Status: DC
  Administered 2024-04-16 – 2024-04-25 (×16): 2 via ORAL
  Filled 2024-04-16 (×17): qty 2

## 2024-04-16 MED ORDER — ATORVASTATIN CALCIUM 10 MG PO TABS: 10.0000 mg | ORAL_TABLET | Freq: Every day | ORAL | Status: DC

## 2024-04-16 MED ORDER — PANTOPRAZOLE SODIUM 40 MG PO TBEC
40.0000 mg | DELAYED_RELEASE_TABLET | Freq: Every day | ORAL | Status: DC
  Administered 2024-04-17 – 2024-04-26 (×8): 40 mg via ORAL
  Filled 2024-04-16 (×8): qty 1

## 2024-04-16 MED ORDER — ACETAMINOPHEN 325 MG PO TABS
650.0000 mg | ORAL_TABLET | Freq: Once | ORAL | Status: AC
  Administered 2024-04-16: 650 mg via ORAL
  Filled 2024-04-16: qty 2

## 2024-04-16 MED ORDER — VANCOMYCIN HCL 750 MG/150ML IV SOLN
750.0000 mg | INTRAVENOUS | Status: DC
  Administered 2024-04-17 – 2024-04-18 (×2): 750 mg via INTRAVENOUS
  Filled 2024-04-16 (×2): qty 150

## 2024-04-16 MED ORDER — OLANZAPINE 5 MG PO TABS
5.0000 mg | ORAL_TABLET | Freq: Every day | ORAL | Status: DC
  Administered 2024-04-16 – 2024-04-25 (×9): 5 mg via ORAL
  Filled 2024-04-16 (×10): qty 1

## 2024-04-16 MED ORDER — ASPIRIN 81 MG PO TBEC
81.0000 mg | DELAYED_RELEASE_TABLET | Freq: Every day | ORAL | Status: DC
  Administered 2024-04-17 – 2024-04-25 (×7): 81 mg via ORAL
  Filled 2024-04-16 (×7): qty 1

## 2024-04-16 MED ORDER — MELATONIN 5 MG PO TABS
5.0000 mg | ORAL_TABLET | Freq: Every day | ORAL | Status: DC
  Administered 2024-04-16 – 2024-04-25 (×9): 5 mg via ORAL
  Filled 2024-04-16 (×8): qty 1

## 2024-04-16 MED ORDER — POLYETHYLENE GLYCOL 3350 17 G PO PACK
34.0000 g | PACK | Freq: Every day | ORAL | Status: DC
  Administered 2024-04-17 – 2024-04-25 (×7): 34 g via ORAL
  Filled 2024-04-16 (×7): qty 2

## 2024-04-16 MED ORDER — SODIUM CHLORIDE 0.9 % IV SOLN
2.0000 g | Freq: Once | INTRAVENOUS | Status: AC
  Administered 2024-04-16: 2 g via INTRAVENOUS
  Filled 2024-04-16: qty 20

## 2024-04-16 MED ORDER — SERTRALINE HCL 50 MG PO TABS
100.0000 mg | ORAL_TABLET | Freq: Every day | ORAL | Status: DC
  Administered 2024-04-17 – 2024-04-26 (×8): 100 mg via ORAL
  Filled 2024-04-16 (×8): qty 2

## 2024-04-16 MED ORDER — LACTATED RINGERS IV SOLN: INTRAVENOUS | Status: AC

## 2024-04-16 MED ORDER — SODIUM CHLORIDE 0.9 % IV SOLN
1.0000 g | INTRAVENOUS | Status: DC
  Administered 2024-04-17 – 2024-04-18 (×2): 1 g via INTRAVENOUS
  Filled 2024-04-16 (×2): qty 10

## 2024-04-16 MED ORDER — FUROSEMIDE 20 MG PO TABS
20.0000 mg | ORAL_TABLET | Freq: Every day | ORAL | Status: DC
  Administered 2024-04-17 – 2024-04-26 (×8): 20 mg via ORAL
  Filled 2024-04-16 (×8): qty 1

## 2024-04-16 MED ORDER — LAMOTRIGINE 25 MG PO TABS
25.0000 mg | ORAL_TABLET | Freq: Two times a day (BID) | ORAL | Status: DC
Start: 2024-04-16 — End: 2024-04-26
  Administered 2024-04-16 – 2024-04-26 (×16): 25 mg via ORAL
  Filled 2024-04-16 (×17): qty 1

## 2024-04-16 MED ORDER — MEDIHONEY WOUND/BURN DRESSING EX PSTE
1.0000 | PASTE | Freq: Every day | CUTANEOUS | Status: DC
  Administered 2024-04-17 – 2024-04-26 (×11): 1 via TOPICAL
  Filled 2024-04-16 (×2): qty 44

## 2024-04-16 MED ORDER — TRAZODONE HCL 100 MG PO TABS
100.0000 mg | ORAL_TABLET | Freq: Every day | ORAL | Status: DC
  Administered 2024-04-16 – 2024-04-25 (×9): 100 mg via ORAL
  Filled 2024-04-16 (×7): qty 1

## 2024-04-16 NOTE — H&P (Addendum)
 History and Physical    Patient: [name] FMW:[id number] DOB: 07/10/1941 DOA: 04/16/2024 DOS: the patient was seen and examined on 04/16/2024 PCP: Isaiah Leisure, MD  Patient coming from: SNF  Chief Complaint: AMS Chief Complaint  Patient presents with   Fever   Altered Mental Status   HPI: [name] is a 83 y.o. female with medical history significant of dementia, anxiety disorder, bipolar, GERD, hyperlipidemia, who lives in a facility brought in with fever as well as altered mental status and new oxygen requirement.  Also found to have worsening of right lower extremity wounds with surrounding cellulitis.  At the time patient was being seen mental status had improved some denies chest pain, nausea vomiting chest pain or abdominal pain. ED course: Upon arrival temperature 100.8, respiratory rate 20, pulse 79, blood pressure 131/66. Given concerns of sepsis patient was initiated on antibiotic therapy in the emergency room and hospitalist service was contacted to admit patient for further management  Review of Systems: As mentioned in the history of present illness. All other systems reviewed and are negative. Past Medical History:  Diagnosis Date   Alzheimer's dementia (HCC)    Insomnia    Low back pain    Vomiting    Past Surgical History:  Procedure Laterality Date   AMPUTATION TOE Left 05/06/2023   Procedure: AMPUTATION TOE 2nd toe;  Surgeon: Silva Juliene SAUNDERS, DPM;  Location: [facility] ORS;  Service: Orthopedics/Podiatry;  Laterality: Left;   HIP ARTHROPLASTY Left 09/22/2019   Procedure: ARTHROPLASTY BIPOLAR HIP (HEMIARTHROPLASTY) RIGHT;  Surgeon: Edie Norleen PARAS, MD;  Location: [facility] ORS;  Service: Orthopedics;  Laterality: Left;   LOWER EXTREMITY INTERVENTION Left 05/04/2023   Procedure: LOWER EXTREMITY INTERVENTION;  Surgeon: Jama Cordella MATSU, MD;  Location: [facility] IN[facility];  Service: Cardiovascular;  Laterality: Left;   VAGINAL HYSTERECTOMY     Social History:  reports  that she has quit smoking. She has never used smokeless tobacco. She reports that she does not currently use alcohol. She reports that she does not currently use drugs.  No Known Allergies  No family history on file.  Prior to Admission medications   Medication Sig Start Date End Date Taking? Authorizing Provider  acetaminophen  (TYLENOL ) 500 MG tablet Take 500 mg by mouth every 4 (four) hours as needed for mild pain or fever.     [provider]  aspirin  EC 81 MG tablet Take 1 tablet (81 mg total) by mouth daily. Swallow whole. 05/11/23   Fausto Burnard LABOR, DO  atorvastatin  (LIPITOR) 10 MG tablet Take 1 tablet (10 mg total) by mouth daily. 05/11/23   Fausto Burnard LABOR, DO  cholecalciferol  (VITAMIN D3) 25 MCG (1000 UT) tablet Take 1,000 Units by mouth daily.    [provider]  clonazePAM  (KLONOPIN ) 0.5 MG tablet Take 0.5 mg by mouth every 12 (twelve) hours as needed for anxiety. Patient not taking: Reported on 03/08/2024    [provider]  clopidogrel  (PLAVIX ) 75 MG tablet Take 1 tablet (75 mg total) by mouth daily with breakfast. 05/11/23   Fausto Burnard A, DO  feeding supplement, ENSURE ENLIVE, (ENSURE ENLIVE) LIQD Take 237 mLs by mouth 2 (two) times daily between meals. 09/27/19   Tobie Yetta HERO, MD  ferrous sulfate  325 (65 FE) MG tablet Take 1 tablet (325 mg total) by mouth daily with breakfast. 05/11/23   Fausto Burnard A, DO  lamoTRIgine  (LAMICTAL ) 25 MG tablet Take 25 mg by mouth 2 (two) times daily.  [provider]  melatonin 5 MG TABS Take 5 mg by mouth at bedtime.    [provider]  Multiple Vitamins-Minerals (MULTIVITAMIN ADULT, MINERALS,) TABS Take 1 tablet by mouth daily.    [provider]  OLANZapine  (ZYPREXA ) 5 MG tablet Take 5 mg by mouth at bedtime.    [provider]  omeprazole (PRILOSEC) 20 MG capsule Take 20 mg by mouth daily. 11/09/23   [provider]  ondansetron  (ZOFRAN ) 4 MG tablet Take 4 mg by  mouth every 8 (eight) hours as needed. 08/16/23   [provider]  oxyCODONE  (OXY IR/ROXICODONE ) 5 MG immediate release tablet Take 1 tablet (5 mg total) by mouth every 4 (four) hours as needed for moderate pain or severe pain. 05/10/23   Fausto Burnard LABOR, DO  polyethylene glycol (MIRALAX  / GLYCOLAX ) 17 g packet Take 34 g by mouth daily. Patient not taking: Reported on 03/08/2024 05/11/23   Fausto Burnard A, DO  senna-docusate (SENOKOT-S) 8.6-50 MG tablet Take 2 tablets by mouth 2 (two) times daily.    [provider]  sertraline  (ZOLOFT ) 100 MG tablet Take 100 mg by mouth daily. 02/21/23   [provider]  traZODone  (DESYREL ) 100 MG tablet Take 100 mg by mouth at bedtime. 02/21/23   [provider]    Physical Exam: Vitals:   04/16/24 0700 04/16/24 0800 04/16/24 0830 04/16/24 0930  BP: 131/66 (!) 118/54 (!) 125/58 134/72  Pulse: 79 76 75 69  Resp: 20 14 (!) 22 16  Temp:      TempSrc:      SpO2: 100% 100% 100% 98%  Weight:      Height:       General: Elderly female laying in bed confused requiring 2 L of oxygen CNS: Altered able to answer questions moving all extremities CVS: S1 and S2 present no added sounds Respiratory: Decreased air entry bibasilarly on 2 L of oxygen Abdomen: Nontender moves with respiration no masses palpable Musculoskeletal: Erythema with differential warmth and blistering lesions noted to the right lower extremity with chronic wound in place  Data Reviewed: Chest x-ray reviewed that showed possible acute on bibasilar region    Latest Ref Rng & Units 04/16/2024    6:35 AM 05/09/2023    5:20 AM 05/08/2023    5:10 AM  CBC  WBC 4.0 - 10.5 K/uL 13.7  9.7  10.0   Hemoglobin 12.0 - 15.0 g/dL 9.0  9.5  8.7   Hematocrit 36.0 - 46.0 % 29.3  30.6  28.5   Platelets 150 - 400 K/uL 499  416  381        Latest Ref Rng & Units 04/16/2024    6:35 AM 05/10/2023    4:41 AM 05/09/2023    5:20 AM  BMP  Glucose 70 - 99 mg/dL 896  899  94   BUN  8 - 23 mg/dL 20  22  24    Creatinine 0.44 - 1.00 mg/dL 9.34  9.52  9.40   Sodium 135 - 145 mmol/L 141  140  141   Potassium 3.5 - 5.1 mmol/L 3.7  3.6  3.6   Chloride 98 - 111 mmol/L 107  107  109   CO2 22 - 32 mmol/L 25  23  23    Calcium  8.9 - 10.3 mg/dL 8.7  8.8  8.7     Assessment and Plan:  Sepsis secondary to right lower extremity cellulitis as well as possible underlying pneumonia Patient met sepsis criteria on  presentation with temperature 100.8, WBC 13.7 in the setting of cellulitis as well as pneumonia Continue vancomycin  and ceftriaxone  Wound care consulted Follow-up culture results  Acute hypoxic respiratory failure secondary to pneumonia Patient currently requiring 2 L of oxygen She does not use any oxygen at home Continue antibiotics Wean off oxygen as tolerated  Acute metabolic encephalopathy secondary to above Continue above management Monitor neurochecks  Dementia Monitor closely  Anxiety disorder, bipolar,  Continue trazodone , sertraline  and olanzapine   GERD- continue PPI therapy   Hyperlipidemia Continue statin therapy   Advance Care Planning: DNI DNR  Consults: None   Severity of Illness: The appropriate patient status for this patient is INPATIENT. Inpatient status is judged to be reasonable and necessary in order to provide the required intensity of service to ensure the patient's safety. The patient's presenting symptoms, physical exam findings, and initial radiographic and laboratory data in the context of their chronic comorbidities is felt to place them at high risk for further clinical deterioration. Furthermore, it is not anticipated that the patient will be medically stable for discharge from the hospital within 2 midnights of admission.   * I certify that at the point of admission it is my clinical judgment that the patient will require inpatient hospital care spanning beyond 2 midnights from the point of admission due to high intensity of  service, high risk for further deterioration and high frequency of surveillance required.*  Author: Drue ONEIDA Potter, MD 04/16/2024 10:11 AM  For on call review [email or web address].

## 2024-04-16 NOTE — ED Notes (Signed)
 Wound on RLE uncovered a this time. Foul odor and greenish-yellow drainage present

## 2024-04-16 NOTE — ED Notes (Signed)
 Called CCMD for central monitoring at this time

## 2024-04-16 NOTE — Progress Notes (Addendum)
 CODE SEPSIS - PHARMACY COMMUNICATION  **Broad Spectrum Antibiotics should be administered within 1 hour of Sepsis diagnosis**  Time Code Sepsis Called/Page Received: 9185  Antibiotics Ordered: Ceftriaxone  and vancomycin   Time of 1st antibiotic administration: 0818  Additional action taken by pharmacy: N/A  [name] 04/16/2024  8:43 AM

## 2024-04-16 NOTE — Consult Note (Addendum)
 WOC Nurse Consult Note: Reason for Consult: Requested to assess a lower extremities wounds on L leg and foot. Wound type: peripheral vascular disease, cellulitis. Pressure Injury POA: NA R leg. Measurement: Entire leg, multiple wounds. Wound bed: 100% dark red, cover with superficial yellow slough.  Drainage (amount, consistency, odor) Minimum amount, no odor, serous. Periwound: redness, bruises. Dressing procedure/placement/frequency: Cleanse with Vashe [id number], pat dry the peri-wound skin, not rinse after use. Apply Xeroform on the wound bed, cover with gauze or ABD pad. Wrap with Kerlix. Change daily or PRN. Wrap from the base of the toes to the knee, do not apply compression to the bandage.  Great toe, metatarsal head and 2nd toe. Measurement: Entire leg, multiple wounds. Wound bed: 100% yellow, callus on the metatarsal head on Great toe. Drainage (amount, consistency, odor) Minimum amount. Periwound: redness, fungal nail. Dressing procedure/placement/frequency: Cleanse with Vashe [id number], pat dry the peri-wound skin, not rinse after use. Apply Medihoney on the wound bed, cover with gauze or foam dressing, wrap with stretch bandage to hold the dressings. Change daily.  WOC team will not plan to follow further. Please reconsult if further assistance is needed. Thank-you,  Lela Holm BSN, CNS, RN, [employer], WOCN  (Phone [phone])

## 2024-04-16 NOTE — Progress Notes (Signed)
 Spoke with Corean, med tech at [facility] - legal guardian Robb Dexter is out until next Monday - please alternatively contact Randine Kipper [facility] DSS supervisor) [phone]

## 2024-04-16 NOTE — Consult Note (Signed)
 Pharmacy Antibiotic Note  [name] is a 83 y.o. female admitted on 04/16/2024 with sepsis.  Pharmacy has been consulted for vancomycin  dosing. Tmax 100.8, on [place] 2 L/min. WBC 13.7, CXR: Significant resolution of the bibasilar opacities with some residual linear opacities.   Plan: Continue ceftriaxone  1 g daily  Patient received vancomycin  1000 mg x 1 in the ED. Will order vancomycin   mg q24H. Predicted AUC of 520. Goal AUC of 400-550. Vd 0.72, TBW, Scr 0.8. Plan to check vancomycin  level after 4th or 5th dose.   Height: 5' 7 (170.2 cm) Weight: 50.9 kg (112 lb 4.8 oz) IBW/kg (Calculated) : 61.6  Temp (24hrs), Avg:100.8 F (38.2 C), Min:100.8 F (38.2 C), Max:100.8 F (38.2 C)  Recent Labs  Lab 04/16/24 0635 04/16/24 0636  WBC 13.7*  --   CREATININE 0.65  --   LATICACIDVEN  --  0.9    Estimated Creatinine Clearance: 42.1 mL/min (by C-G formula based on SCr of 0.65 mg/dL).    No Known Allergies  Antimicrobials this admission: 8/4 ceftriaxone  >>  8/4 vancomycin  >>   Dose adjustments this admission: None  Microbiology results: 8/4 BCx: pending  Thank you for allowing pharmacy to be a part of this patient's care.  Cathaleen GORMAN Blanch, PharmD, BCPS 04/16/2024 10:18 AM

## 2024-04-16 NOTE — Sepsis Progress Note (Signed)
 Elink monitoring for the code sepsis protocol.

## 2024-04-16 NOTE — ED Triage Notes (Signed)
 Patient brought in via [facility] due to initial call out for stroke like symptoms. Staff reported patient was not at her baseline and having facial droop. Ems reports she did not score on their stroke screen but was positive for sepsis due to breathing and fever. Patient is currently being treated for wounds on her legs, unknown from what.

## 2024-04-16 NOTE — ED Provider Notes (Signed)
 [facility] Provider Note    Event Date/Time   First MD Initiated Contact with Patient 04/16/24 [phone]     (approximate)   History   Fever and Altered Mental Status  HPI  [name] is a 83 y.o. female past medical history significant for dementia, bipolar disorder, anxiety, GERD, peripheral vascular disease, tobacco use, hyperlipidemia, presents to the emergency department with fever and altered mental status.  Initially called out for concern for strokelike symptoms.  Staff reported the patient was not at her baseline was altered.  EMS noted that her stroke screen was negative.  Patient found to have a fever.  Patient is uncertain of why she is here.  Denies cough, chest pain, shortness of breath, abdominal pain, dysuria, urinary urgency or frequency.  Does endorse pain to her left lower leg.  On chart review patient was evaluated recently by vascular surgery with Dr. Jama with concern for limb threatening ischemia and plans for angioplasty to the left lower extremity  On arrival to the emergency department patient was febrile to 1-1.5, tachypneic and had hypoxia to 87% requiring 2 L nasal cannula.  Glucose 116.     Physical Exam   Triage Vital Signs: ED Triage Vitals  Encounter Vitals Group     BP 04/16/24 0629 131/61     Girls Systolic BP Percentile --      Girls Diastolic BP Percentile --      Boys Systolic BP Percentile --      Boys Diastolic BP Percentile --      Pulse Rate 04/16/24 0629 94     Resp 04/16/24 0629 19     Temp 04/16/24 0629 (!) 100.8 F (38.2 C)     Temp Source 04/16/24 0629 Rectal     SpO2 04/16/24 0625 (!) 87 %     Weight 04/16/24 0629 112 lb 4.8 oz (50.9 kg)     Height 04/16/24 0629 5' 7 (1.702 m)     Head Circumference --      Peak Flow --      Pain Score --      Pain Loc --      Pain Education --      Exclude from Growth Chart --     Most recent vital signs: Vitals:   04/16/24 0700 04/16/24 0800  BP: 131/66  (!) 118/54  Pulse: 79 76  Resp: 20 14  Temp:    SpO2: 100% 100%    Physical Exam Constitutional:      Appearance: She is well-developed.  HENT:     Head: Atraumatic.     Mouth/Throat:     Mouth: Mucous membranes are dry.  Eyes:     Extraocular Movements: Extraocular movements intact.     Conjunctiva/sclera: Conjunctivae normal.     Pupils: Pupils are equal, round, and reactive to light.  Cardiovascular:     Rate and Rhythm: Regular rhythm.  Pulmonary:     Effort: No respiratory distress.     Breath sounds: Rhonchi present.     Comments: 2 L nasal cannula to 100% Abdominal:     General: There is no distension.     Tenderness: There is no abdominal tenderness.  Musculoskeletal:        General: Normal range of motion.     Cervical back: Normal range of motion.  Skin:    General: Skin is warm.     Findings: Rash present.     Comments: Doppler pulse to  the left left DP, palpable pulse to the right DP  Neurological:     General: No focal deficit present.     Mental Status: She is alert. Mental status is at baseline.        IMPRESSION / MDM / ASSESSMENT AND PLAN / ED COURSE  I reviewed the triage vital signs and the nursing notes.  Differential diagnosis including sepsis, osteomyelitis, cellulitis, urinary tract infection, pneumonia, viral illness including COVID/influenza.  Blood cultures obtained.  Given antipyretics.  Given 1 L of IV fluids and will start the patient on antibiotics.  EKG  I, Clotilda Punter, the attending physician, personally viewed and interpreted this ECG.   Rate: Normal  Rhythm: Normal sinus  Intervals: Normal  ST&T Change: None No change when compared to prior EKG  No tachycardic or bradycardic dysrhythmias while on cardiac telemetry.  RADIOLOGY I independently reviewed imaging, my interpretation of imaging: Chest x-ray without obvious pneumonia.  Read as significant resolution of bibasilar opacities with some residual linear  opacities.  LABS (all labs ordered are listed, but only abnormal results are displayed) Labs interpreted as -    Labs Reviewed  COMPREHENSIVE METABOLIC PANEL WITH GFR - Abnormal; Notable for the following components:      Result Value   Glucose, Bld 103 (*)    Calcium  8.7 (*)    Total Protein 5.7 (*)    Albumin 2.3 (*)    All other components within normal limits  CBC WITH DIFFERENTIAL/PLATELET - Abnormal; Notable for the following components:   WBC 13.7 (*)    RBC 3.51 (*)    Hemoglobin 9.0 (*)    HCT 29.3 (*)    MCH 25.6 (*)    RDW 17.0 (*)    Platelets 499 (*)    Neutro Abs 10.2 (*)    Monocytes Absolute 1.2 (*)    All other components within normal limits  CULTURE, BLOOD (ROUTINE X 2)  CULTURE, BLOOD (ROUTINE X 2)  RESP PANEL BY RT-PCR (RSV, FLU A&B, COVID)  RVPGX2  LACTIC ACID, PLASMA  PROTIME-INR  URINALYSIS, W/ REFLEX TO CULTURE (INFECTION SUSPECTED)     MDM  Labs with leukocytosis of 13.7 with left shift.  Anemia with a hemoglobin of 9 which appears to be close to her baseline.  Normal lactic acid.  No significant hyperglycemia and normal LFTs.  No significant electrolyte abnormality.  Low suspicion for CVA, has a nonfocal neurologic exam and appears to be at her mental status baseline.  Answering most questions appropriately.  Does have a history of dementia.  Concern for possible cellulitis from chronic venous stasis and peripheral vascular disease to the left lower extremity.  Will start on antibiotics with MRSA coverage.  Able to get up dopplerable pulse to the left foot.  Consulted hospitalist for admission     PROCEDURES:  Critical Care performed: yes  .Critical Care  Performed by: Punter Clotilda, MD Authorized by: Punter Clotilda, MD   Critical care provider statement:    Critical care time (minutes):  30   Critical care time was exclusive of:  Separately billable procedures and treating other patients   Critical care was necessary to treat or  prevent imminent or life-threatening deterioration of the following conditions:  Sepsis   Critical care was time spent personally by me on the following activities:  Development of treatment plan with patient or surrogate, discussions with consultants, evaluation of patient's response to treatment, examination of patient, ordering and review of laboratory studies, ordering and  review of radiographic studies, ordering and performing treatments and interventions, pulse oximetry, re-evaluation of patient's condition and review of old charts   Care discussed with: admitting provider     Patient's presentation is most consistent with acute presentation with potential threat to life or bodily function.   MEDICATIONS ORDERED IN ED: Medications  lactated ringers  infusion (has no administration in time range)  sodium chloride  0.9 % bolus 1,000 mL (has no administration in time range)  vancomycin  (VANCOCIN ) IVPB 1000 mg/200 mL premix (has no administration in time range)  cefTRIAXone  (ROCEPHIN ) 2 g in sodium chloride  0.9 % 100 mL IVPB (has no administration in time range)  acetaminophen  (TYLENOL ) tablet 650 mg (650 mg Oral Given 04/16/24 0714)    FINAL CLINICAL IMPRESSION(S) / ED DIAGNOSES   Final diagnoses:  Fever, unspecified fever cause  Wound of left lower extremity, subsequent encounter  Cellulitis of left lower extremity  Hypoxia     Rx / DC Orders   ED Discharge Orders     None        Note:  This document was prepared using Dragon voice recognition software and may include unintentional dictation errors.   Suzanne Kirsch, MD 04/16/24 [phone]

## 2024-04-17 DIAGNOSIS — G9341 Metabolic encephalopathy: Secondary | ICD-10-CM | POA: Diagnosis not present

## 2024-04-17 DIAGNOSIS — A419 Sepsis, unspecified organism: Secondary | ICD-10-CM

## 2024-04-17 DIAGNOSIS — R6520 Severe sepsis without septic shock: Secondary | ICD-10-CM | POA: Diagnosis not present

## 2024-04-17 LAB — CBC
HCT: 30.4 % — ABNORMAL LOW (ref 36.0–46.0)
Hemoglobin: 9.2 g/dL — ABNORMAL LOW (ref 12.0–15.0)
MCH: 25.1 pg — ABNORMAL LOW (ref 26.0–34.0)
MCHC: 30.3 g/dL (ref 30.0–36.0)
MCV: 83.1 fL (ref 80.0–100.0)
Platelets: 487 K/uL — ABNORMAL HIGH (ref 150–400)
RBC: 3.66 MIL/uL — ABNORMAL LOW (ref 3.87–5.11)
RDW: 16.8 % — ABNORMAL HIGH (ref 11.5–15.5)
WBC: 9.7 K/uL (ref 4.0–10.5)
nRBC: 0 % (ref 0.0–0.2)

## 2024-04-17 LAB — BLOOD CULTURE ID PANEL (REFLEXED) - BCID2

## 2024-04-17 LAB — BASIC METABOLIC PANEL WITH GFR
Anion gap: 7 (ref 5–15)
BUN: 15 mg/dL (ref 8–23)
CO2: 26 mmol/L (ref 22–32)
Calcium: 8.9 mg/dL (ref 8.9–10.3)
Chloride: 106 mmol/L (ref 98–111)
Creatinine, Ser: 0.59 mg/dL (ref 0.44–1.00)
GFR, Estimated: >60 mL/min (ref >60)
Glucose, Bld: 91 mg/dL (ref 70–99)
Potassium: 3.4 mmol/L — ABNORMAL LOW (ref 3.5–5.1)
Sodium: 139 mmol/L (ref 135–145)

## 2024-04-17 MED ORDER — ADULT MULTIVITAMIN W/MINERALS CH
1.0000 | ORAL_TABLET | Freq: Every day | ORAL | Status: DC
  Administered 2024-04-21 – 2024-04-26 (×7): 1 via ORAL
  Filled 2024-04-17 (×7): qty 1

## 2024-04-17 MED ORDER — VITAMIN C 500 MG PO TABS
250.0000 mg | ORAL_TABLET | Freq: Two times a day (BID) | ORAL | Status: DC
  Administered 2024-04-18 – 2024-04-26 (×15): 250 mg via ORAL
  Filled 2024-04-17 (×16): qty 1

## 2024-04-17 MED ORDER — ENSURE PLUS HIGH PROTEIN PO LIQD
237.0000 mL | Freq: Two times a day (BID) | ORAL | Status: DC
  Administered 2024-04-20 – 2024-04-25 (×12): 237 mL via ORAL

## 2024-04-17 MED ORDER — POTASSIUM CHLORIDE CRYS ER 20 MEQ PO TBCR
40.0000 meq | EXTENDED_RELEASE_TABLET | ORAL | Status: AC
  Administered 2024-04-17 (×2): 40 meq via ORAL
  Filled 2024-04-17 (×2): qty 2

## 2024-04-17 NOTE — Progress Notes (Signed)
 Initial Nutrition Assessment  DOCUMENTATION CODES:   Severe malnutrition in context of social or environmental circumstances  INTERVENTION:   Ensure Plus High Protein po BID, each supplement provides 350 kcal and 20 grams of protein  Magic cup TID with meals, each supplement provides 290 kcal and 9 grams of protein  MVI po daily   Vitamin C  250mg  po BID   Liberalize diet   Pt at refeed risk; recommend monitor potassium, magnesium  and phosphorus labs daily until stable  Daily weights   NUTRITION DIAGNOSIS:   Severe Malnutrition related to social / environmental circumstances as evidenced by severe fat depletion, severe muscle depletion.  GOAL:   Patient will meet greater than or equal to 90% of their needs  MONITOR:   PO intake, Supplement acceptance, Labs, Weight trends, Skin, I & O's  REASON FOR ASSESSMENT:   Other (Comment) (Low BMI)    ASSESSMENT:   83 y/o female with h/o bipolar disorder, dementia, anxiety, PAD, HLD and GERD who is admitted with PNA, left lower extremity cellulitis and sepsis.  Met with pt in room today. Pt sitting up in chair and in good spirits. Pt reports good appetite and oral intake at baseline but reports that she does not eat much at the facility where she lives as the food is terrible. Pt reports that she used to drink Ensure but reports that its no longer provided to her. Pt reports that her oral intake is good in hospital. Pt reports that she is eating most of her meals; pt is documented to be eating 100% of meals. RD discussed with pt the importance of adequate nutrition needed to preserve lean muscle. Pt is agreeable to drink chocolate Ensure. RD will add supplements and MVI to help pt meet her estimated needs. Pt is at refeed risk. Pt reports weight loss over the past several years. Per chart, pt is down ~8lbs(7%) from her UBW; RD unsure how recently weight loss occurred.   Medications reviewed and include: aspirin , lovenox , lasix ,  melatonin, protonix , miralax , senokot, ceftriaxone , vancomycin    Labs reviewed: K 3.4(L) Hgb 9.2(L), Hct 30.4(L)  NUTRITION - FOCUSED PHYSICAL EXAM:  Flowsheet Row Most Recent Value  Orbital Region Severe depletion  Upper Arm Region Severe depletion  Thoracic and Lumbar Region Severe depletion  Buccal Region Moderate depletion  Temple Region Moderate depletion  Clavicle Bone Region Severe depletion  Scapular Bone Region Moderate depletion  Dorsal Hand Severe depletion  Patellar Region Moderate depletion  Anterior Thigh Region Severe depletion  Posterior Calf Region Severe depletion  Edema (RD Assessment) None  Hair Reviewed  Eyes Reviewed  Mouth Reviewed  Skin Reviewed  Nails Reviewed   Diet Order:   Diet Order             Diet regular Room service appropriate? Yes; Fluid consistency: Thin  Diet effective now                  EDUCATION NEEDS:   Education needs have been addressed  Skin:  Skin Assessment: Reviewed RN Assessment (lower extremities wounds on L leg and foot: peripheral vascular disease cellulitis, great toe, metatarsal head and 2nd toe)  Last BM:  8/3- constipation per pt  Height:   Ht Readings from Last 1 Encounters:  04/16/24 5' 7 (1.702 m)    Weight:   Wt Readings from Last 1 Encounters:  04/16/24 50.9 kg    Ideal Body Weight:  61.36 kg  BMI:  Body mass index is 17.59 kg/m.  Estimated Nutritional Needs:   Kcal:  1400-1600kcal/day  Protein:  70-80g/day  Fluid:  1.4-1.6L/day  Augustin Shams MS, RD, LDN If unable to be reached, please send secure chat to RD inpatient available from 8:00a-4:00p daily

## 2024-04-17 NOTE — Progress Notes (Signed)
 PHARMACY - PHYSICIAN COMMUNICATION CRITICAL VALUE ALERT - BLOOD CULTURE IDENTIFICATION (BCID)  Results for orders placed or performed during the hospital encounter of 04/16/24  Culture, blood (Routine x 2)     Status: None (Preliminary result)   Collection Time: 04/16/24  6:43 AM   Specimen: BLOOD RIGHT HAND  Result Value Ref Range Status   Specimen Description   Final    BLOOD RIGHT HAND Performed at [facility], [address], [place], [place] [address]    Special Requests   Final    BOTTLES DRAWN AEROBIC AND ANAEROBIC Blood Culture results may not be optimal due to an inadequate volume of blood received in culture bottles Performed at [facility], [address], [place], [place] [address]    Culture  Setup Time   Final    GRAM POSITIVE COCCI IN CLUSTERS IN BOTH AEROBIC AND ANAEROBIC BOTTLES CRITICAL RESULT CALLED TO, READ BACK BY AND VERIFIED WITH: MAYA RANKIN KATHEE CATHE [id number] FCP Performed at [facility], [address] [place], [place] [address]    Culture GRAM POSITIVE COCCI  Final   Report Status PENDING  Incomplete  Blood Culture ID Panel (Reflexed)     Status: Abnormal   Collection Time: 04/16/24  6:43 AM  Result Value Ref Range Status   Enterococcus faecalis NOT DETECTED NOT DETECTED Final   Enterococcus Faecium NOT DETECTED NOT DETECTED Final   Listeria monocytogenes NOT DETECTED NOT DETECTED Final   Staphylococcus species DETECTED (A) NOT DETECTED Final    Comment: CRITICAL RESULT CALLED TO, READ BACK BY AND VERIFIED WITH: PHARMD Sabreen Kitchen B [id number] FCP    Staphylococcus aureus (BCID) NOT DETECTED NOT DETECTED Final   Staphylococcus epidermidis NOT DETECTED NOT DETECTED Final   Staphylococcus lugdunensis NOT DETECTED NOT DETECTED Final   Streptococcus species NOT DETECTED NOT DETECTED Final   Streptococcus agalactiae NOT DETECTED NOT DETECTED Final   Streptococcus pneumoniae NOT DETECTED NOT DETECTED Final   Streptococcus pyogenes  NOT DETECTED NOT DETECTED Final   A.calcoaceticus-baumannii NOT DETECTED NOT DETECTED Final   Bacteroides fragilis NOT DETECTED NOT DETECTED Final   Enterobacterales NOT DETECTED NOT DETECTED Final   Enterobacter cloacae complex NOT DETECTED NOT DETECTED Final   Escherichia coli NOT DETECTED NOT DETECTED Final   Klebsiella aerogenes NOT DETECTED NOT DETECTED Final   Klebsiella oxytoca NOT DETECTED NOT DETECTED Final   Klebsiella pneumoniae NOT DETECTED NOT DETECTED Final   Proteus species NOT DETECTED NOT DETECTED Final   Salmonella species NOT DETECTED NOT DETECTED Final   Serratia marcescens NOT DETECTED NOT DETECTED Final   Haemophilus influenzae NOT DETECTED NOT DETECTED Final   Neisseria meningitidis NOT DETECTED NOT DETECTED Final   Pseudomonas aeruginosa NOT DETECTED NOT DETECTED Final   Stenotrophomonas maltophilia NOT DETECTED NOT DETECTED Final   Candida albicans NOT DETECTED NOT DETECTED Final   Candida auris NOT DETECTED NOT DETECTED Final   Candida glabrata NOT DETECTED NOT DETECTED Final   Candida krusei NOT DETECTED NOT DETECTED Final   Candida parapsilosis NOT DETECTED NOT DETECTED Final   Candida tropicalis NOT DETECTED NOT DETECTED Final   Cryptococcus neoformans/gattii NOT DETECTED NOT DETECTED Final    Comment: Performed at [facility], [address] [place], [place] [address]  Resp panel by RT-PCR (RSV, Flu A&B, Covid) Anterior Nasal Swab     Status: None   Collection Time: 04/16/24  7:19 AM   Specimen: Anterior Nasal Swab  Result Value Ref Range Status  SARS Coronavirus 2 by RT PCR NEGATIVE NEGATIVE Final    Comment: (NOTE) SARS-CoV-2 target nucleic acids are NOT DETECTED.  The SARS-CoV-2 RNA is generally detectable in upper respiratory specimens during the acute phase of infection. The lowest concentration of SARS-CoV-2 viral copies this assay can detect is 138 copies/mL. A negative result does not preclude SARS-Cov-2 infection and should not be  used as the sole basis for treatment or other patient management decisions. A negative result may occur with  improper specimen collection/handling, submission of specimen other than nasopharyngeal swab, presence of viral mutation(s) within the areas targeted by this assay, and inadequate number of viral copies(<138 copies/mL). A negative result must be combined with clinical observations, patient history, and epidemiological information. The expected result is Negative.  Fact Sheet for Patients:  BloggerCourse.com  Fact Sheet for Healthcare Providers:  SeriousBroker.it  This test is no t yet approved or cleared by the [place]  FDA and  has been authorized for detection and/or diagnosis of SARS-CoV-2 by FDA under an Emergency Use Authorization (EUA). This EUA will remain  in effect (meaning this test can be used) for the duration of the COVID-19 declaration under Section 564(b)(1) of the Act, 21 U.S.C.section 360bbb-3(b)(1), unless the authorization is terminated  or revoked sooner.       Influenza A by PCR NEGATIVE NEGATIVE Final   Influenza B by PCR NEGATIVE NEGATIVE Final    Comment: (NOTE) The Xpert Xpress SARS-CoV-2/FLU/RSV plus assay is intended as an aid in the diagnosis of influenza from Nasopharyngeal swab specimens and should not be used as a sole basis for treatment. Nasal washings and aspirates are unacceptable for Xpert Xpress SARS-CoV-2/FLU/RSV testing.  Fact Sheet for Patients: BloggerCourse.com  Fact Sheet for Healthcare Providers: SeriousBroker.it  This test is not yet approved or cleared by the [place]  FDA and has been authorized for detection and/or diagnosis of SARS-CoV-2 by FDA under an Emergency Use Authorization (EUA). This EUA will remain in effect (meaning this test can be used) for the duration of the COVID-19 declaration under Section  564(b)(1) of the Act, 21 U.S.C. section 360bbb-3(b)(1), unless the authorization is terminated or revoked.     Resp Syncytial Virus by PCR NEGATIVE NEGATIVE Final    Comment: (NOTE) Fact Sheet for Patients: BloggerCourse.com  Fact Sheet for Healthcare Providers: SeriousBroker.it  This test is not yet approved or cleared by the [place]  FDA and has been authorized for detection and/or diagnosis of SARS-CoV-2 by FDA under an Emergency Use Authorization (EUA). This EUA will remain in effect (meaning this test can be used) for the duration of the COVID-19 declaration under Section 564(b)(1) of the Act, 21 U.S.C. section 360bbb-3(b)(1), unless the authorization is terminated or revoked.  Performed at [facility], [address] [place], [place] [address]   MRSA Next Gen by PCR, Nasal     Status: None   Collection Time: 04/16/24 12:52 PM   Specimen: Nasal Mucosa; Nasal Swab  Result Value Ref Range Status   MRSA by PCR Next Gen NOT DETECTED NOT DETECTED Final    Comment: (NOTE) The GeneXpert MRSA Assay (FDA approved for NASAL specimens only), is one component of a comprehensive MRSA colonization surveillance program. It is not intended to diagnose MRSA infection nor to guide or monitor treatment for MRSA infections. Test performance is not FDA approved in patients less than 66 years old. Performed at [facility], [address], [place], [place] [address]     BCID Results: 2 (same  set) of 4 bottles w/ Staph species.  Pt currently on Ceftriaxone  1 gm q24h and Vancomycin .  Name of provider contacted: HILARIO Solian, MD   Changes to prescribed antibiotics required: Continue Ceftriaxone  and Vancomycin  at this time.  Rankin CANDIE Dills, PharmD, MBA 04/17/2024 1:07 AM

## 2024-04-17 NOTE — Progress Notes (Signed)
 Progress Note   Patient: [name] FMW:[id number] DOB: 06-30-1941 DOA: 04/16/2024     1 DOS: the patient was seen and examined on 04/17/2024   Brief hospital course: From HPI [name] is a 82 y.o. female with medical history significant of dementia, anxiety disorder, bipolar, GERD, hyperlipidemia, who lives in a facility brought in with fever as well as altered mental status and new oxygen requirement.  Also found to have worsening of right lower extremity wounds with surrounding cellulitis.  At the time patient was being seen mental status had improved some, denies chest pain, nausea vomiting chest pain or abdominal pain. ED course: Upon arrival temperature 100.8, respiratory rate 20, pulse 79, blood pressure 131/66. Given concerns of sepsis patient was initiated on antibiotic therapy in the emergency room and hospitalist service was contacted to admit patient for further management     Assessment and Plan:  Sepsis secondary to left lower extremity cellulitis as well as possible underlying pneumonia Patient met sepsis criteria on presentation with temperature 100.8, WBC 13.7 in the setting of cellulitis as well as pneumonia Continue vancomycin  and ceftriaxone  Wound care consulted Follow-up culture results   Acute hypoxic respiratory failure secondary to pneumonia Patient currently requiring 2 L of oxygen She does not use any oxygen at home Continue antibiotics Wean off oxygen as tolerated   Acute metabolic encephalopathy secondary to above Continue above management Monitor neurochecks   Dementia Monitor closely   Anxiety disorder, bipolar,  Continue trazodone , sertraline  and olanzapine    GERD- continue PPI therapy     Hyperlipidemia Continue statin therapy    Advance Care Planning: DNI DNR   Consults: None    Subjective: Seen and examined At bedside this morning Mental status improving Denying worsening respiratory function chest pain or cough  Physical  Exam:  General: Elderly female laying in bed confused requiring 2 L of oxygen CNS: Altered able to answer questions moving all extremities CVS: S1 and S2 present no added sounds Respiratory: Decreased air entry bibasilarly on 2 L of oxygen Abdomen: Nontender moves with respiration no masses palpable Musculoskeletal: Erythema with differential warmth and blistering lesions noted to the right lower extremity with chronic wound in place  Vitals:   04/16/24 1114 04/16/24 2034 04/17/24 0452 04/17/24 0739  BP: (!) 142/62 (!) 137/59 (!) 145/65 131/70  Pulse: 70 84 83 83  Resp: 18 20 20 16   Temp: 98.3 F (36.8 C) 100 F (37.8 C) 98.2 F (36.8 C) 97.7 F (36.5 C)  TempSrc:  Oral Oral Oral  SpO2: 100% 99% 98% 100%  Weight:      Height:        Data Reviewed:  CT scan of the brain did not show any acute intracranial pathology    Latest Ref Rng & Units 04/17/2024    4:13 AM 04/16/2024    6:35 AM 05/09/2023    5:20 AM  CBC  WBC 4.0 - 10.5 K/uL 9.7  13.7  9.7   Hemoglobin 12.0 - 15.0 g/dL 9.2  9.0  9.5   Hematocrit 36.0 - 46.0 % 30.4  29.3  30.6   Platelets 150 - 400 K/uL 487  499  416        Latest Ref Rng & Units 04/17/2024    4:13 AM 04/16/2024    6:35 AM 05/10/2023    4:41 AM  BMP  Glucose 70 - 99 mg/dL 91  896  899   BUN 8 - 23 mg/dL 15  20  22   Creatinine 0.44 - 1.00 mg/dL 9.40  9.34  9.52   Sodium 135 - 145 mmol/L 139  141  140   Potassium 3.5 - 5.1 mmol/L 3.4  3.7  3.6   Chloride 98 - 111 mmol/L 106  107  107   CO2 22 - 32 mmol/L 26  25  23    Calcium  8.9 - 10.3 mg/dL 8.9  8.7  8.8     Family Communication: None at bedside today  Disposition: Back to facility when medically stable  Time spent: 52 minutes  Author: Drue ONEIDA Potter, MD 04/17/2024 12:20 PM  For on call review [email or web address].

## 2024-04-17 NOTE — Evaluation (Signed)
 Occupational Therapy Evaluation Patient Details Name: [name] MRN: [id number] DOB: 1940/12/25 Today's Date: 04/17/2024   History of Present Illness   [name] is a 83 y.o. female with medical history significant of dementia, anxiety disorder, bipolar, GERD, hyperlipidemia, who lives in a facility brought in with fever as well as altered mental status and new oxygen requirement.  Also found to have worsening of right lower extremity wounds with surrounding cellulitis.    Clinical Impressions [name] was seen for OT evaluation this date. Prior to hospital admission, pt was resident at [facility] memory care unit, reports assist for ADLs and w/c t/fs as needed. On arrival pt seated in recliner. Pt currently requires MAX A for LB access in sitting. Initially agreeable to stand and clears rear into partial stand without LLE support (elects to maintain NWB or TDWB). Pt refuses assistance from OT citing fear of falling, unwilling to attempt fully upright standing. Completes x10 partial stands no assist, anticipate MIN-MOD A for t/fs. Pt would benefit from skilled OT to address noted impairments and functional limitations (see below for any additional details). Upon hospital discharge, recommend return to facility with therapy follow up.     If plan is discharge home, recommend the following:   A little help with bathing/dressing/bathroom;A little help with walking and/or transfers     Functional Status Assessment   Patient has had a recent decline in their functional status and demonstrates the ability to make significant improvements in function in a reasonable and predictable amount of time.     Equipment Recommendations   BSC/3in1     Recommendations for Other Services         Precautions/Restrictions   Precautions Precautions: Fall Recall of Precautions/Restrictions: Intact Restrictions Weight Bearing Restrictions Per Provider Order: No     Mobility  Bed Mobility               General bed mobility comments: not tested pt in chair    Transfers Overall transfer level: Needs assistance Equipment used: None Transfers: Sit to/from Stand Sit to Stand: Contact guard assist           General transfer comment: achieves partial stand, refuses to allow hands on assist to achieve full stand or t/f      Balance Overall balance assessment: Needs assistance Sitting-balance support: No upper extremity supported, Feet supported Sitting balance-Leahy Scale: Fair                                     ADL either performed or assessed with clinical judgement   ADL Overall ADL's : Needs assistance/impaired                                       General ADL Comments: MAX A for LB dressing. SETUP seated grooming tasks. Anticipate MOD A for w/c t/f     Vision         Perception         Praxis         Pertinent Vitals/Pain Pain Assessment Pain Assessment: No/denies pain     Extremity/Trunk Assessment Upper Extremity Assessment Upper Extremity Assessment: Overall WFL for tasks assessed   Lower Extremity Assessment Lower Extremity Assessment: Generalized weakness       Communication Communication Communication: No apparent difficulties  Cognition Arousal: Alert Behavior During Therapy: WFL for tasks assessed/performed Cognition: History of cognitive impairments                               Following commands: Intact       Cueing  General Comments          Exercises Exercises: Other exercises Other Exercises Other Exercises: x10 chair push ups clearing rear   Shoulder Instructions      Home Living Family/patient expects to be discharged to:: Skilled nursing facility                                 Additional Comments: reprots [facility] memory care unit      Prior Functioning/Environment Prior Level of Function : Needs assist              Mobility Comments: reports t/fs to/from w/c ADLs Comments: reports assistance for ADLs as needed, MOD I dressing but difficulty    OT Problem List: Decreased strength;Decreased activity tolerance;Impaired balance (sitting and/or standing);Decreased safety awareness   OT Treatment/Interventions: Self-care/ADL training;Therapeutic exercise;Energy conservation;DME and/or AE instruction;Therapeutic activities;Patient/family education      OT Goals(Current goals can be found in the care plan section)   Acute Rehab OT Goals Patient Stated Goal: to return to facility OT Goal Formulation: With patient Time For Goal Achievement: 05/01/24 Potential to Achieve Goals: Fair ADL Goals Pt Will Perform Grooming: with modified independence;sitting Pt Will Perform Lower Body Dressing: with modified independence;sitting/lateral leans Pt Will Transfer to Toilet: with modified independence;stand pivot transfer;bedside commode   OT Frequency:  Min 2X/week    Co-evaluation              AM-PAC OT 6 Clicks Daily Activity     Outcome Measure Help from another person eating meals?: None Help from another person taking care of personal grooming?: None Help from another person toileting, which includes using toliet, bedpan, or urinal?: A Lot Help from another person bathing (including washing, rinsing, drying)?: A Lot Help from another person to put on and taking off regular upper body clothing?: A Little Help from another person to put on and taking off regular lower body clothing?: A Lot 6 Click Score: 17   End of Session    Activity Tolerance: Patient tolerated treatment well;Treatment limited secondary to agitation Patient left: in chair;with call bell/phone within reach;with chair alarm set  OT Visit Diagnosis: Other abnormalities of gait and mobility (R26.89);Muscle weakness (generalized) (M62.81)                Time: 8854-8793 OT Time Calculation (min): 21 min Charges:  OT  General Charges $OT Visit: 1 Visit OT Evaluation $OT Eval Low Complexity: 1 Low  Elston Slot, M.S. OTR/L  04/17/24, 2:07 PM  ascom [phone]

## 2024-04-17 NOTE — Plan of Care (Signed)

## 2024-04-18 DIAGNOSIS — A419 Sepsis, unspecified organism: Secondary | ICD-10-CM | POA: Diagnosis not present

## 2024-04-18 DIAGNOSIS — E43 Unspecified severe protein-calorie malnutrition: Secondary | ICD-10-CM | POA: Insufficient documentation

## 2024-04-18 LAB — BASIC METABOLIC PANEL WITH GFR
Anion gap: 4 — ABNORMAL LOW (ref 5–15)
BUN: 17 mg/dL (ref 8–23)
CO2: 25 mmol/L (ref 22–32)
Calcium: 8.9 mg/dL (ref 8.9–10.3)
Chloride: 108 mmol/L (ref 98–111)
Creatinine, Ser: 0.54 mg/dL (ref 0.44–1.00)
GFR, Estimated: >60 mL/min (ref >60)
Glucose, Bld: 117 mg/dL — ABNORMAL HIGH (ref 70–99)
Potassium: 4.4 mmol/L (ref 3.5–5.1)
Sodium: 137 mmol/L (ref 135–145)

## 2024-04-18 LAB — CBC WITH DIFFERENTIAL/PLATELET
Abs Immature Granulocytes: 0.04 K/uL (ref 0.00–0.07)
Basophils Absolute: 0 K/uL (ref 0.0–0.1)
Basophils Relative: 0 %
Eosinophils Absolute: 0.3 K/uL (ref 0.0–0.5)
Eosinophils Relative: 3 %
HCT: 28 % — ABNORMAL LOW (ref 36.0–46.0)
Hemoglobin: 8.6 g/dL — ABNORMAL LOW (ref 12.0–15.0)
Immature Granulocytes: 0 %
Lymphocytes Relative: 21 %
Lymphs Abs: 2.3 K/uL (ref 0.7–4.0)
MCH: 25.1 pg — ABNORMAL LOW (ref 26.0–34.0)
MCHC: 30.7 g/dL (ref 30.0–36.0)
MCV: 81.6 fL (ref 80.0–100.0)
Monocytes Absolute: 1.1 K/uL — ABNORMAL HIGH (ref 0.1–1.0)
Monocytes Relative: 10 %
Neutro Abs: 7.1 K/uL (ref 1.7–7.7)
Neutrophils Relative %: 66 %
Platelets: 500 K/uL — ABNORMAL HIGH (ref 150–400)
RBC: 3.43 MIL/uL — ABNORMAL LOW (ref 3.87–5.11)
RDW: 16.8 % — ABNORMAL HIGH (ref 11.5–15.5)
WBC: 10.8 K/uL — ABNORMAL HIGH (ref 4.0–10.5)
nRBC: 0 % (ref 0.0–0.2)

## 2024-04-18 LAB — BLOOD GAS, VENOUS
Acid-Base Excess: 4.9 mmol/L — ABNORMAL HIGH (ref 0.0–2.0)
Bicarbonate: 29.2 mmol/L — ABNORMAL HIGH (ref 20.0–28.0)
O2 Saturation: 83.4 %
Patient temperature: 37
pCO2, Ven: 41 mmHg — ABNORMAL LOW (ref 44–60)
pH, Ven: 7.46 — ABNORMAL HIGH (ref 7.25–7.43)
pO2, Ven: 52 mmHg — ABNORMAL HIGH (ref 32–45)

## 2024-04-18 LAB — PHOSPHORUS: Phosphorus: 2.7 mg/dL (ref 2.5–4.6)

## 2024-04-18 LAB — PROCALCITONIN: Procalcitonin: <0.1 ng/mL

## 2024-04-18 LAB — MAGNESIUM: Magnesium: 1.2 mg/dL — ABNORMAL LOW (ref 1.7–2.4)

## 2024-04-18 MED ORDER — SODIUM CHLORIDE 0.9 % IV SOLN
2.0000 g | INTRAVENOUS | Status: DC
  Administered 2024-04-19 – 2024-04-20 (×2): 2 g via INTRAVENOUS
  Filled 2024-04-18 (×2): qty 20

## 2024-04-18 MED ORDER — FERROUS SULFATE 325 (65 FE) MG PO TABS
325.0000 mg | ORAL_TABLET | Freq: Every day | ORAL | Status: DC
  Administered 2024-04-21 – 2024-04-26 (×7): 325 mg via ORAL
  Filled 2024-04-18 (×6): qty 1

## 2024-04-18 MED ORDER — ACETAMINOPHEN 325 MG PO TABS: 650.0000 mg | ORAL_TABLET | Freq: Four times a day (QID) | ORAL | Status: DC | PRN

## 2024-04-18 MED ORDER — MAGNESIUM OXIDE 400 MG PO TABS
400.0000 mg | ORAL_TABLET | Freq: Every day | ORAL | Status: DC
  Administered 2024-04-19 – 2024-04-26 (×8): 400 mg via ORAL
  Filled 2024-04-18 (×14): qty 1

## 2024-04-18 MED ORDER — MAGNESIUM SULFATE 4 GM/100ML IV SOLN
4.0000 g | Freq: Once | INTRAVENOUS | Status: AC
  Administered 2024-04-18: 4 g via INTRAVENOUS
  Filled 2024-04-18: qty 100

## 2024-04-18 MED ORDER — ACETAMINOPHEN 10 MG/ML IV SOLN
1000.0000 mg | Freq: Once | INTRAVENOUS | Status: AC
  Administered 2024-04-18: 1000 mg via INTRAVENOUS
  Filled 2024-04-18: qty 100

## 2024-04-18 NOTE — Progress Notes (Signed)
 PROGRESS NOTE    [name]  FMW:[id number] DOB: Feb 02, 1941 DOA: 04/16/2024 PCP: Isaiah Leisure, MD    Brief Narrative:   From HPI [name] is a 83 y.o. female with medical history significant of dementia, anxiety disorder, bipolar, GERD, hyperlipidemia, who lives in a facility brought in with fever as well as altered mental status and new oxygen requirement.  Also found to have worsening of right lower extremity wounds with surrounding cellulitis.  At the time patient was being seen mental status had improved some, denies chest pain, nausea vomiting chest pain or abdominal pain. ED course: Upon arrival temperature 100.8, respiratory rate 20, pulse 79, blood pressure 131/66. Given concerns of sepsis patient was initiated on antibiotic therapy in the emergency room and hospitalist service was contacted to admit patient for further management      Assessment & Plan:   Principal Problem:   Sepsis (HCC) Active Problems:   Protein-calorie malnutrition, severe  Sepsis secondary to left lower extremity cellulitis as well as possible underlying pneumonia Patient met sepsis criteria on presentation with temperature 100.8, WBC 13.7 in the setting of cellulitis as well as pneumonia MRSA screen negative Plan: Continue Rocephin .  DC vancomycin .  WOC consult.  Recommendations appreciated.   Acute hypoxic respiratory failure secondary to pneumonia Patient currently requiring 2 L of oxygen She does not use any oxygen at home Weaned to room air.  Antibiotics as above   Acute metabolic encephalopathy secondary to above Baseline mental status not clear at this time.  Continue to monitor   Dementia Continue to monitor closely   Anxiety disorder, bipolar,  Continue trazodone , sertraline  and olanzapine    GERD Continue PPI     Hyperlipidemia Continue statin therapy    DVT prophylaxis: Lovenox  Code Status: DNR Family Communication: None Disposition Plan: Status is:  Inpatient Remains inpatient appropriate because: Sepsis secondary pneumonia   Level of care: Telemetry Medical  Consultants:  None  Procedures:  None  Antimicrobials: Ceftriaxone    Subjective: Seen and examined.  Affect flattened.  Reports wanting sleep  Objective: Vitals:   04/17/24 1639 04/17/24 1958 04/18/24 0344 04/18/24 0902  BP: (!) 157/63 (!) 144/68 (!) 108/56 (!) 130/58  Pulse: 86 91 77 80  Resp: 16 18 18 18   Temp: 98 F (36.7 C) 98.3 F (36.8 C) 98.5 F (36.9 C) 98.2 F (36.8 C)  TempSrc: Oral Oral Oral Oral  SpO2: 91% 99% 90% 92%  Weight:   53.3 kg   Height:        Intake/Output Summary (Last 24 hours) at 04/18/2024 1318 Last data filed at 04/18/2024 1016 Gross per 24 hour  Intake 490 ml  Output 1200 ml  Net -710 ml   Filed Weights   04/16/24 0629 04/18/24 0344  Weight: 50.9 kg 53.3 kg    Examination:  General exam: Appears fatigued chronically ill Respiratory system: Clear to auscultation. Respiratory effort normal. Cardiovascular system: S1-S2, RRR, 2/6 heart murmur, normal bowel sounds Gastrointestinal system: Thin, soft, T/ND, normal bowel sounds Central nervous system: Alert and oriented. No focal neurological deficits. Extremities: Decreased power bilateral lower extremities Skin: No rashes, lesions or ulcers Psychiatry: Judgement and insight appear impaired. Mood & affect flattened.     Data Reviewed: I have personally reviewed following labs and imaging studies  CBC: Recent Labs  Lab 04/16/24 0635 04/17/24 0413 04/18/24 0341  WBC 13.7* 9.7 10.8*  NEUTROABS 10.2*  --  7.1  HGB 9.0* 9.2* 8.6*  HCT 29.3* 30.4* 28.0*  MCV 83.5  83.1 81.6  PLT 499* 487* 500*   Basic Metabolic Panel: Recent Labs  Lab 04/16/24 0635 04/17/24 0413 04/18/24 0341  NA 141 139 137  K 3.7 3.4* 4.4  CL 107 106 108  CO2 25 26 25   GLUCOSE 103* 91 117*  BUN 20 15 17   CREATININE 0.65 0.59 0.54  CALCIUM  8.7* 8.9 8.9  MG  --   --  1.2*  PHOS  --   --   2.7   GFR: Estimated Creatinine Clearance: 44 mL/min (by C-G formula based on SCr of 0.54 mg/dL). Liver Function Tests: Recent Labs  Lab 04/16/24 0635  AST 23  ALT 16  ALKPHOS 70  BILITOT 0.5  PROT 5.7*  ALBUMIN 2.3*   No results for input(s): LIPASE, AMYLASE in the last 168 hours. No results for input(s): AMMONIA in the last 168 hours. Coagulation Profile: Recent Labs  Lab 04/16/24 0635  INR 1.0   Cardiac Enzymes: No results for input(s): CKTOTAL, CKMB, CKMBINDEX, TROPONINI in the last 168 hours. BNP (last 3 results) No results for input(s): PROBNP in the last 8760 hours. HbA1C: No results for input(s): HGBA1C in the last 72 hours. CBG: No results for input(s): GLUCAP in the last 168 hours. Lipid Profile: No results for input(s): CHOL, HDL, LDLCALC, TRIG, CHOLHDL, LDLDIRECT in the last 72 hours. Thyroid Function Tests: No results for input(s): TSH, T4TOTAL, FREET4, T3FREE, THYROIDAB in the last 72 hours. Anemia Panel: No results for input(s): VITAMINB12, FOLATE, FERRITIN, TIBC, IRON, RETICCTPCT in the last 72 hours. Sepsis Labs: Recent Labs  Lab 04/16/24 0636 04/18/24 0341  PROCALCITON  --  <0.10  LATICACIDVEN 0.9  --     Recent Results (from the past 240 hours)  Culture, blood (Routine x 2)     Status: None (Preliminary result)   Collection Time: 04/16/24  6:35 AM   Specimen: BLOOD LEFT ARM  Result Value Ref Range Status   Specimen Description BLOOD LEFT ARM  Final   Special Requests   Final    BOTTLES DRAWN AEROBIC AND ANAEROBIC Blood Culture adequate volume   Culture   Final    NO GROWTH 2 DAYS Performed at [facility], [address], [place], [place] [address]    Report Status PENDING  Incomplete  Culture, blood (Routine x 2)     Status: Abnormal (Preliminary result)   Collection Time: 04/16/24  6:43 AM   Specimen: BLOOD RIGHT HAND  Result Value Ref Range Status   Specimen Description    Final    BLOOD RIGHT HAND Performed at [facility], [address], [place], [place] [address]    Special Requests   Final    BOTTLES DRAWN AEROBIC AND ANAEROBIC Blood Culture results may not be optimal due to an inadequate volume of blood received in culture bottles Performed at [facility], [address] [place], [place] [address]    Culture  Setup Time   Final    GRAM POSITIVE COCCI IN CLUSTERS IN BOTH AEROBIC AND ANAEROBIC BOTTLES CRITICAL RESULT CALLED TO, READ BACK BY AND VERIFIED WITH: PHARMD NATHAN B 0104 [id number] FCP    Culture (A)  Final    COAGULASE NEGATIVE STAPHYLOCOCCUS THE SIGNIFICANCE OF ISOLATING THIS ORGANISM FROM A SINGLE SET OF BLOOD CULTURES WHEN MULTIPLE SETS ARE DRAWN IS UNCERTAIN. PLEASE NOTIFY THE MICROBIOLOGY DEPARTMENT WITHIN ONE WEEK IF SPECIATION AND SENSITIVITIES ARE REQUIRED. Performed at [facility], [address], [place], [place] [address]    Report  Status PENDING  Incomplete  Blood Culture ID Panel (Reflexed)     Status: Abnormal   Collection Time: 04/16/24  6:43 AM  Result Value Ref Range Status   Enterococcus faecalis NOT DETECTED NOT DETECTED Final   Enterococcus Faecium NOT DETECTED NOT DETECTED Final   Listeria monocytogenes NOT DETECTED NOT DETECTED Final   Staphylococcus species DETECTED (A) NOT DETECTED Final    Comment: CRITICAL RESULT CALLED TO, READ BACK BY AND VERIFIED WITH: PHARMD NATHAN B 0104 [id number] FCP    Staphylococcus aureus (BCID) NOT DETECTED NOT DETECTED Final   Staphylococcus epidermidis NOT DETECTED NOT DETECTED Final   Staphylococcus lugdunensis NOT DETECTED NOT DETECTED Final   Streptococcus species NOT DETECTED NOT DETECTED Final   Streptococcus agalactiae NOT DETECTED NOT DETECTED Final   Streptococcus pneumoniae NOT DETECTED NOT DETECTED Final   Streptococcus pyogenes NOT DETECTED NOT DETECTED Final   A.calcoaceticus-baumannii NOT DETECTED NOT DETECTED Final   Bacteroides fragilis NOT  DETECTED NOT DETECTED Final   Enterobacterales NOT DETECTED NOT DETECTED Final   Enterobacter cloacae complex NOT DETECTED NOT DETECTED Final   Escherichia coli NOT DETECTED NOT DETECTED Final   Klebsiella aerogenes NOT DETECTED NOT DETECTED Final   Klebsiella oxytoca NOT DETECTED NOT DETECTED Final   Klebsiella pneumoniae NOT DETECTED NOT DETECTED Final   Proteus species NOT DETECTED NOT DETECTED Final   Salmonella species NOT DETECTED NOT DETECTED Final   Serratia marcescens NOT DETECTED NOT DETECTED Final   Haemophilus influenzae NOT DETECTED NOT DETECTED Final   Neisseria meningitidis NOT DETECTED NOT DETECTED Final   Pseudomonas aeruginosa NOT DETECTED NOT DETECTED Final   Stenotrophomonas maltophilia NOT DETECTED NOT DETECTED Final   Candida albicans NOT DETECTED NOT DETECTED Final   Candida auris NOT DETECTED NOT DETECTED Final   Candida glabrata NOT DETECTED NOT DETECTED Final   Candida krusei NOT DETECTED NOT DETECTED Final   Candida parapsilosis NOT DETECTED NOT DETECTED Final   Candida tropicalis NOT DETECTED NOT DETECTED Final   Cryptococcus neoformans/gattii NOT DETECTED NOT DETECTED Final    Comment: Performed at [facility], [address], [place], [place] [address]  Resp panel by RT-PCR (RSV, Flu A&B, Covid) Anterior Nasal Swab     Status: None   Collection Time: 04/16/24  7:19 AM   Specimen: Anterior Nasal Swab  Result Value Ref Range Status   SARS Coronavirus 2 by RT PCR NEGATIVE NEGATIVE Final    Comment: (NOTE) SARS-CoV-2 target nucleic acids are NOT DETECTED.  The SARS-CoV-2 RNA is generally detectable in upper respiratory specimens during the acute phase of infection. The lowest concentration of SARS-CoV-2 viral copies this assay can detect is 138 copies/mL. A negative result does not preclude SARS-Cov-2 infection and should not be used as the sole basis for treatment or other patient management decisions. A negative result may occur with  improper  specimen collection/handling, submission of specimen other than nasopharyngeal swab, presence of viral mutation(s) within the areas targeted by this assay, and inadequate number of viral copies(<138 copies/mL). A negative result must be combined with clinical observations, patient history, and epidemiological information. The expected result is Negative.  Fact Sheet for Patients:  BloggerCourse.com  Fact Sheet for Healthcare Providers:  SeriousBroker.it  This test is no t yet approved or cleared by the [place]  FDA and  has been authorized for detection and/or diagnosis of SARS-CoV-2 by FDA under an Emergency Use Authorization (EUA). This EUA will remain  in effect (meaning this test can be used) for  the duration of the COVID-19 declaration under Section 564(b)(1) of the Act, 21 U.S.C.section 360bbb-3(b)(1), unless the authorization is terminated  or revoked sooner.       Influenza A by PCR NEGATIVE NEGATIVE Final   Influenza B by PCR NEGATIVE NEGATIVE Final    Comment: (NOTE) The Xpert Xpress SARS-CoV-2/FLU/RSV plus assay is intended as an aid in the diagnosis of influenza from Nasopharyngeal swab specimens and should not be used as a sole basis for treatment. Nasal washings and aspirates are unacceptable for Xpert Xpress SARS-CoV-2/FLU/RSV testing.  Fact Sheet for Patients: BloggerCourse.com  Fact Sheet for Healthcare Providers: SeriousBroker.it  This test is not yet approved or cleared by the [place]  FDA and has been authorized for detection and/or diagnosis of SARS-CoV-2 by FDA under an Emergency Use Authorization (EUA). This EUA will remain in effect (meaning this test can be used) for the duration of the COVID-19 declaration under Section 564(b)(1) of the Act, 21 U.S.C. section 360bbb-3(b)(1), unless the authorization is terminated or revoked.     Resp  Syncytial Virus by PCR NEGATIVE NEGATIVE Final    Comment: (NOTE) Fact Sheet for Patients: BloggerCourse.com  Fact Sheet for Healthcare Providers: SeriousBroker.it  This test is not yet approved or cleared by the [place]  FDA and has been authorized for detection and/or diagnosis of SARS-CoV-2 by FDA under an Emergency Use Authorization (EUA). This EUA will remain in effect (meaning this test can be used) for the duration of the COVID-19 declaration under Section 564(b)(1) of the Act, 21 U.S.C. section 360bbb-3(b)(1), unless the authorization is terminated or revoked.  Performed at [facility], [address] [place], [place] [address]   MRSA Next Gen by PCR, Nasal     Status: None   Collection Time: 04/16/24 12:52 PM   Specimen: Nasal Mucosa; Nasal Swab  Result Value Ref Range Status   MRSA by PCR Next Gen NOT DETECTED NOT DETECTED Final    Comment: (NOTE) The GeneXpert MRSA Assay (FDA approved for NASAL specimens only), is one component of a comprehensive MRSA colonization surveillance program. It is not intended to diagnose MRSA infection nor to guide or monitor treatment for MRSA infections. Test performance is not FDA approved in patients less than 12 years old. Performed at [facility], [address] [place], [place] [address]          Radiology Studies: CT HEAD WO CONTRAST ( ) Result Date: 04/16/2024 EXAM: CT HEAD WITHOUT 04/16/2024 04:26:00 PM TECHNIQUE: CT of the head was performed without the administration of intravenous contrast. Automated exposure control, iterative reconstruction, and/or weight based adjustment of the mA/kV was utilized to reduce the radiation dose to as low as reasonably achievable. COMPARISON: 03/28/2021 CLINICAL HISTORY: Stroke, follow up. Sepsis. FINDINGS: BRAIN AND VENTRICLES: No acute intracranial hemorrhage. No mass effect or midline shift. No extra-axial  fluid collection. Gray-white differentiation is maintained. No hydrocephalus. Chronic ischemic white matter changes. ORBITS: No acute abnormality. SINUSES AND MASTOIDS: No acute abnormality. SOFT TISSUES AND SKULL: No acute skull fracture. No acute soft tissue abnormality. Atherosclerotic calcifications of the internal carotid arteries at the skull base. IMPRESSION: 1. No acute intracranial abnormality. 2. Chronic ischemic white matter changes. 3. Atherosclerotic calcifications of the internal carotid arteries at the skull base. Electronically signed by: Franky Stanford MD 04/16/2024 07:27 PM EDT RP Workstation: [id number]        Scheduled Meds:  vitamin C   250 mg Oral BID   aspirin  EC  81 mg Oral Daily   enoxaparin  (  LOVENOX ) injection  40 mg Subcutaneous Q24H   feeding supplement  237 mL Oral BID BM   [START ON 04/19/2024] ferrous sulfate   325 mg Oral Q breakfast   furosemide   20 mg Oral Daily   lamoTRIgine   25 mg Oral BID   leptospermum manuka honey  1 Application Topical Daily   [START ON 04/19/2024] magnesium  oxide  400 mg Oral Daily   melatonin  5 mg Oral QHS   multivitamin with minerals  1 tablet Oral Daily   OLANZapine   5 mg Oral QHS   pantoprazole   40 mg Oral Daily   polyethylene glycol  34 g Oral Daily   senna-docusate  2 tablet Oral BID   sertraline   100 mg Oral Daily   traZODone   100 mg Oral QHS   Continuous Infusions:  [START ON 04/19/2024] cefTRIAXone  (ROCEPHIN )  IV     magnesium  sulfate bolus IVPB 4 g (04/18/24 1216)     LOS: 2 days      Calvin KATHEE Robson, MD [facility]   If 7PM-7AM, please contact night-coverage  04/18/2024, 1:18 PM

## 2024-04-18 NOTE — Evaluation (Signed)
 Physical Therapy Evaluation Patient Details Name: [name] MRN: [id number] DOB: [birth year] Today's Date: 04/18/2024  History of Present Illness  [name] is a 83 y.o. female with medical history significant of dementia, anxiety disorder, bipolar, GERD, hyperlipidemia, who lives in a facility brought in with fever as well as altered mental status and new oxygen requirement.  Also found to have worsening of right lower extremity wounds with surrounding cellulitis.  Clinical Impression  Pt is a pleasant 83 year old female who was admitted for sepsis. Pt performs bed mobility with min assist and transfers to/from recliner with mod assist for squat pivot. Pt demonstrates deficits with [name]/mobility. Pt is non-ambulatory at baseline. Appears to be at baseline level, despite being very lethargic during evaluation. Would benefit from skilled PT to address above deficits and promote optimal return to PLOF. Pt will continue to receive skilled PT services while admitted and will defer to TOC/care team for updates regarding disposition planning.         If plan is discharge home, recommend the following: A lot of help with walking and/or transfers   Can travel by private vehicle        Equipment Recommendations  (TBD)  Recommendations for Other Services       Functional Status Assessment Patient has had a recent decline in their functional status and/or demonstrates limited ability to make significant improvements in function in a reasonable and predictable amount of time     Precautions / Restrictions Precautions Precautions: Fall Recall of Precautions/Restrictions: Intact Restrictions Weight Bearing Restrictions Per Provider Order: No      Mobility  Bed Mobility Overal bed mobility: Needs Assistance Bed Mobility: Supine to Sit     Supine to sit: Min assist     General bed mobility comments: needs assist for cues for sequencing. once seated at EOB, upright posture and  pt able to sit with supervision    Transfers Overall transfer level: Needs assistance Equipment used: None Transfers: Sit to/from Stand, Bed to chair/wheelchair/BSC       Squat pivot transfers: Mod assist     General transfer comment: squat pivot from bed<>chair due to pt soiled in bed. poor participation due to lethargy- pt reports she just wants to sleep. While seated in recliner, this writer changed bed linens and gown. Pt then pivoted back to bed for repositioning and to nap    Ambulation/Gait               General Gait Details: doesn't amb at baseline  Stairs            Wheelchair Mobility     Tilt Bed    Modified Rankin (Stroke Patients Only)       Balance Overall balance assessment: Needs assistance Sitting-balance support: No upper extremity supported, Feet supported Sitting balance-Leahy Scale: Fair     Standing balance support: Bilateral upper extremity supported Standing balance-Leahy Scale: Poor                               Pertinent Vitals/Pain Pain Assessment Pain Assessment: Faces Faces Pain Scale: Hurts little more Pain Location: L ankle Pain Descriptors / Indicators: Aching Pain Intervention(s): Repositioned    Home Living Family/patient expects to be discharged to:: Skilled nursing facility                   Additional Comments: reprots [name] memory care unit  Prior Function Prior Level of Function : Needs assist             Mobility Comments: reports t/fs to/from w/c ADLs Comments: reports assistance for ADLs as needed, MOD I dressing but difficulty     Extremity/Trunk Assessment   Upper Extremity Assessment Upper Extremity Assessment: Generalized weakness    Lower Extremity Assessment Lower Extremity Assessment: Generalized weakness (L LE painful with donning sock, appears grossly 3/5)       Communication   Communication Communication: No apparent difficulties    Cognition  Arousal: Lethargic Behavior During Therapy: WFL for tasks assessed/performed   PT - Cognitive impairments: History of cognitive impairments                       PT - Cognition Comments: alert to self and place, however very lethargic and confused to situation Following commands: Intact       Cueing Cueing Techniques: Verbal cues     General Comments      Exercises Other Exercises Other Exercises: extended time for hygiene, new purewick, bed linen/gown replacement, repositioning   Assessment/Plan    PT Assessment Patient needs continued PT services  PT Problem List Decreased [name];Decreased activity tolerance;Decreased balance;Decreased mobility;Decreased safety awareness       PT Treatment Interventions DME instruction;Therapeutic activities;Therapeutic exercise;Balance training    PT Goals (Current goals can be found in the Care Plan section)  Acute Rehab PT Goals Patient Stated Goal: unable to state PT Goal Formulation: Patient unable to participate in goal setting Time For Goal Achievement: 05/02/24 Potential to Achieve Goals: Fair    Frequency Min 2X/week     Co-evaluation               AM-PAC PT 6 Clicks Mobility  Outcome Measure Help needed turning from your back to your side while in a flat bed without using bedrails?: A Little Help needed moving from lying on your back to sitting on the side of a flat bed without using bedrails?: A Little Help needed moving to and from a bed to a chair (including a wheelchair)?: A Lot Help needed standing up from a chair using your arms (e.g., wheelchair or bedside chair)?: A Lot Help needed to walk in hospital room?: Total Help needed climbing 3-5 steps with a railing? : Total 6 Click Score: 12    End of Session   Activity Tolerance: Patient limited by lethargy Patient left: in bed;with bed alarm set Nurse Communication: Mobility status PT Visit Diagnosis: Unsteadiness on feet (R26.81);Muscle  weakness (generalized) (M62.81);Difficulty in walking, not elsewhere classified (R26.2)    Time: 9040-8976 PT Time Calculation (min) (ACUTE ONLY): 24 min   Charges:   PT Evaluation $PT Eval Low Complexity: 1 Low PT Treatments $Therapeutic Activity: 8-22 mins PT General Charges $$ ACUTE PT VISIT: 1 Visit         Corean Dade, PT, DPT, GCS [phone]   Erinn Mendosa 04/18/2024, 11:20 AM

## 2024-04-18 NOTE — Plan of Care (Signed)

## 2024-04-18 NOTE — Care Management Important Message (Signed)
 Important Message  Patient Details  Name: [name] MRN: [id number] Date of Birth: 12-20-1940   Important Message Given:  Yes - Medicare IM     [name] 04/18/2024, 12:27 PM

## 2024-04-18 NOTE — Progress Notes (Signed)
 Occupational Therapy Treatment Patient Details Name: [name] MRN: [id number] DOB: [birth year] Today's Date: 04/18/2024   History of present illness [name] is a 83 y.o. female with medical history significant of dementia, anxiety disorder, bipolar, GERD, hyperlipidemia, who lives in a facility brought in with fever as well as altered mental status and new oxygen requirement.  Also found to have worsening of right lower extremity wounds with surrounding cellulitis.   OT comments  Ms. Maalouf is very lethargic this date. She requires Min A for supine<>sit transfers; quickly states she wants to return to bed. With tactile cueing, pt is able to perform grooming and limited UE therex in supine. She requests a drink but does not have the energy to hold the cup INDly. Pt states, all I want to do is go to sleep. Nurse in room, reports that pt's demeanor today is 180 degrees from yesterday's, saying that pt was talkative and alert then. Provided instructions as to how to use call bell, with pt able to perform teach-back. Will continue to follow POC.      If plan is discharge home, recommend the following:  A lot of help with walking and/or transfers;A lot of help with bathing/dressing/bathroom   Equipment Recommendations       Recommendations for Other Services      Precautions / Restrictions Precautions Precautions: Fall Restrictions Weight Bearing Restrictions Per Provider Order: No       Mobility Bed Mobility Overal bed mobility: Needs Assistance Bed Mobility: Supine to Sit, Sit to Supine     Supine to sit: Mod assist Sit to supine: Mod assist        Transfers                   General transfer comment: NT 2/2 pt fatigue     Balance Overall balance assessment: Needs assistance Sitting-balance support: Feet supported, Bilateral upper extremity supported Sitting balance-Leahy Scale: Fair                                     ADL either  performed or assessed with clinical judgement   ADL Overall ADL's : Needs assistance/impaired Eating/Feeding: Set up;Bed level;Minimal assistance Eating/Feeding Details (indicate cue type and reason): Min A for holding cup Grooming: Wash/dry hands;Wash/dry face;Set up;Supervision/safety;Bed level;Oral care                                      Extremity/Trunk Assessment Upper Extremity Assessment Upper Extremity Assessment: Generalized weakness   Lower Extremity Assessment Lower Extremity Assessment: Generalized weakness        Vision       Perception     Praxis     Communication Communication Communication: No apparent difficulties   Cognition Arousal: Lethargic Behavior During Therapy: WFL for tasks assessed/performed Cognition: History of cognitive impairments             OT - Cognition Comments: oriented to self only                 Following commands: Intact        Cueing   Cueing Techniques: Tactile cues  Exercises Other Exercises Other Exercises: therex in supine    Shoulder Instructions       General Comments      Pertinent Vitals/ Pain  Pain Assessment Pain Assessment: No/denies pain  Home Living                                          Prior Functioning/Environment              Frequency  Min 2X/week        Progress Toward Goals  OT Goals(current goals can now be found in the care plan section)        Plan      Co-evaluation                 AM-PAC OT 6 Clicks Daily Activity     Outcome Measure   Help from another person eating meals?: A Little Help from another person taking care of personal grooming?: A Little Help from another person toileting, which includes using toliet, bedpan, or urinal?: A Lot Help from another person bathing (including washing, rinsing, drying)?: A Lot Help from another person to put on and taking off regular upper body clothing?: A  Lot Help from another person to put on and taking off regular lower body clothing?: A Lot 6 Click Score: 14    End of Session    OT Visit Diagnosis: Other abnormalities of gait and mobility (R26.89);Muscle weakness (generalized) (M62.81)   Activity Tolerance Patient limited by lethargy   Patient Left in bed;with call bell/phone within reach;with bed alarm set;with nursing/sitter in room   Nurse Communication          Time: 1530-1540 OT Time Calculation (min): 10 min  Charges: OT General Charges $OT Visit: 1 Visit OT Treatments $Self Care/Home Management : 8-22 mins  Suzen Hock, PhD, MS, OTR/L 04/18/24, 3:47 PM

## 2024-04-19 DIAGNOSIS — E43 Unspecified severe protein-calorie malnutrition: Secondary | ICD-10-CM

## 2024-04-19 DIAGNOSIS — A419 Sepsis, unspecified organism: Secondary | ICD-10-CM | POA: Diagnosis not present

## 2024-04-19 DIAGNOSIS — R509 Fever, unspecified: Secondary | ICD-10-CM

## 2024-04-19 DIAGNOSIS — R0902 Hypoxemia: Secondary | ICD-10-CM | POA: Diagnosis not present

## 2024-04-19 DIAGNOSIS — L03116 Cellulitis of left lower limb: Secondary | ICD-10-CM

## 2024-04-19 DIAGNOSIS — S81802D Unspecified open wound, left lower leg, subsequent encounter: Secondary | ICD-10-CM

## 2024-04-19 DIAGNOSIS — Z515 Encounter for palliative care: Secondary | ICD-10-CM

## 2024-04-19 LAB — CULTURE, BLOOD (ROUTINE X 2)

## 2024-04-19 MED ORDER — OXYCODONE HCL 5 MG PO TABS
5.0000 mg | ORAL_TABLET | ORAL | Status: DC | PRN
  Administered 2024-04-19: 5 mg via ORAL
  Administered 2024-04-21: 10 mg via ORAL
  Administered 2024-04-22: 5 mg via ORAL
  Filled 2024-04-19: qty 1
  Filled 2024-04-19: qty 2
  Filled 2024-04-19: qty 1

## 2024-04-19 MED ORDER — ACETAMINOPHEN 325 MG PO TABS
650.0000 mg | ORAL_TABLET | Freq: Three times a day (TID) | ORAL | Status: DC
  Administered 2024-04-19 – 2024-04-26 (×20): 650 mg via ORAL
  Filled 2024-04-19 (×16): qty 2

## 2024-04-19 NOTE — Consult Note (Addendum)
 WOC Nurse wound follow up Reason for Consult: Requested to reassess a lower extremities wounds, the wounds increase the exudate. Wound type: peripheral vascular disease, cellulitis. Pressure Injury POA: NA R leg. Measurement: Entire leg, multiple wounds. Wound bed: 100% beefy red, signs of biofilm into the wound bed. Drainage (amount, consistency, odor) Moderate amount, no odor, serous. Periwound: Crusts, intact skin. Dressing procedure/placement/frequency: Cleanse with Vashe [id number], pat dry the peri-wound skin, not rinse after use. Apply Aquacel [id number] into the wound bed, cover with gauze or ABD pad. Apply on the peri-wound skin the barrier cream available in the clean storage. Wrap with Kerlix, add ace wrap if is necessary. Change daily or PRN. Wrap from the base of the toes to the knee, do not apply compression to the bandage.  WOC team will not plan to follow further. Please reconsult if further assistance is needed. Thank-you,  Lela Holm BSN, CNS, RN, [employer], WOCN  (Phone [phone])

## 2024-04-19 NOTE — Progress Notes (Signed)
 PROGRESS NOTE    [name]  FMW:[id number] DOB: [birth year] DOA: 04/16/2024 PCP: Isaiah Leisure, MD    Brief Narrative:   From HPI Amber ERKKILA is a 83 y.o. female with medical history significant of dementia, anxiety disorder, bipolar, GERD, hyperlipidemia, who lives in a facility brought in with fever as well as altered mental status and new oxygen requirement.  Also found to have worsening of right lower extremity wounds with surrounding cellulitis.  At the time patient was being seen mental status had improved some, denies chest pain, nausea vomiting chest pain or abdominal pain. ED course: Upon arrival temperature 100.8, respiratory rate 20, pulse 79, blood pressure 131/66. Given concerns of sepsis patient was initiated on antibiotic therapy in the emergency room and hospitalist service was contacted to admit patient for further management      Assessment & Plan:   Principal Problem:   Sepsis (HCC) Active Problems:   Protein-calorie malnutrition, severe  Sepsis secondary to left lower extremity cellulitis as well as possible underlying pneumonia Patient met sepsis criteria on presentation with temperature 100.8, WBC 13.7 in the setting of cellulitis as well as pneumonia MRSA screen negative Plan: Continue Rocephin .  Appreciate WOC recommendations.  Nursing for wound care.   Acute hypoxic respiratory failure secondary to pneumonia Patient currently requiring 2 L of oxygen She does not use any oxygen at home Weaned to room air.  Antibiotics as above   Acute metabolic encephalopathy secondary to above Baseline mental status not clear at this time.  Continue to monitor   Dementia Continue to monitor closely   Anxiety disorder, bipolar,  Continue trazodone , sertraline  and olanzapine    GERD Continue PPI     Hyperlipidemia Continue statin therapy  Functional decline Patient with poor baseline.  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  Poor family support.  Has a DSS  guardian.  Quality of life has diminished.  I feel she is appropriate for further de-escalation of care and consideration for engagement of hospice services.  Palliative care consult requested.    DVT prophylaxis: Lovenox  Code Status: DNR Family Communication: None Disposition Plan: Status is: Inpatient Remains inpatient appropriate because: Pneumonia on IV antibiotics   Level of care: Telemetry Medical  Consultants:  None  Procedures:  None  Antimicrobials: Ceftriaxone    Subjective: Seen and examined.  No acute events overnight.  Affect remains flattened.  Energy level low.  Objective: Vitals:   04/18/24 2010 04/19/24 0513 04/19/24 0741 04/19/24 1039  BP: 124/66 (!) 128/54 (!) 109/56   Pulse: 64 72 79   Resp: 16 15 15    Temp: 97.7 F (36.5 C) 97.8 F (36.6 C) 97.9 F (36.6 C)   TempSrc:      SpO2: 95% 95% 97%   Weight:    51.9 kg  Height:        Intake/Output Summary (Last 24 hours) at 04/19/2024 1419 Last data filed at 04/19/2024 1403 Gross per 24 hour  Intake 0 ml  Output 450 ml  Net -450 ml   Filed Weights   04/16/24 0629 04/18/24 0344 04/19/24 1039  Weight: 50.9 kg 53.3 kg 51.9 kg    Examination:  General exam: Appears fatigued.  Chronically ill. Respiratory system: Scattered fine crackles bilaterally.  Normal work of breathing.  Room air Cardiovascular system: S1-S2, RRR, 2/6 heart murmur, normal bowel sounds Gastrointestinal system: Thin, soft, T/ND, normal bowel sounds Central nervous system: Alert and oriented. No focal neurological deficits. Extremities: Decreased power bilateral lower extremities Skin: No rashes,  lesions or ulcers Psychiatry: Judgement and insight appear impaired. Mood & affect flattened.     Data Reviewed: I have personally reviewed following labs and imaging studies  CBC: Recent Labs  Lab 04/16/24 0635 04/17/24 0413 04/18/24 0341  WBC 13.7* 9.7 10.8*  NEUTROABS 10.2*  --  7.1  HGB 9.0* 9.2* 8.6*  HCT 29.3* 30.4*  28.0*  MCV 83.5 83.1 81.6  PLT 499* 487* 500*   Basic Metabolic Panel: Recent Labs  Lab 04/16/24 0635 04/17/24 0413 04/18/24 0341  NA 141 139 137  K 3.7 3.4* 4.4  CL 107 106 108  CO2 25 26 25   GLUCOSE 103* 91 117*  BUN 20 15 17   CREATININE 0.65 0.59 0.54  CALCIUM  8.7* 8.9 8.9  MG  --   --  1.2*  PHOS  --   --  2.7   GFR: Estimated Creatinine Clearance: 42.9 mL/min (by C-G formula based on SCr of 0.54 mg/dL). Liver Function Tests: Recent Labs  Lab 04/16/24 0635  AST 23  ALT 16  ALKPHOS 70  BILITOT 0.5  PROT 5.7*  ALBUMIN 2.3*   No results for input(s): LIPASE, AMYLASE in the last 168 hours. No results for input(s): AMMONIA in the last 168 hours. Coagulation Profile: Recent Labs  Lab 04/16/24 0635  INR 1.0   Cardiac Enzymes: No results for input(s): CKTOTAL, CKMB, CKMBINDEX, TROPONINI in the last 168 hours. BNP (last 3 results) No results for input(s): PROBNP in the last 8760 hours. HbA1C: No results for input(s): HGBA1C in the last 72 hours. CBG: No results for input(s): GLUCAP in the last 168 hours. Lipid Profile: No results for input(s): CHOL, HDL, LDLCALC, TRIG, CHOLHDL, LDLDIRECT in the last 72 hours. Thyroid Function Tests: No results for input(s): TSH, T4TOTAL, FREET4, T3FREE, THYROIDAB in the last 72 hours. Anemia Panel: No results for input(s): VITAMINB12, FOLATE, FERRITIN, TIBC, IRON, RETICCTPCT in the last 72 hours. Sepsis Labs: Recent Labs  Lab 04/16/24 0636 04/18/24 0341  PROCALCITON  --  <0.10  LATICACIDVEN 0.9  --     Recent Results (from the past 240 hours)  Culture, blood (Routine x 2)     Status: None (Preliminary result)   Collection Time: 04/16/24  6:35 AM   Specimen: BLOOD LEFT ARM  Result Value Ref Range Status   Specimen Description BLOOD LEFT ARM  Final   Special Requests   Final    BOTTLES DRAWN AEROBIC AND ANAEROBIC Blood Culture adequate volume   Culture   Final     NO GROWTH 3 DAYS Performed at [facility], [address], [place], [place] [address]    Report Status PENDING  Incomplete  Culture, blood (Routine x 2)     Status: Abnormal   Collection Time: 04/16/24  6:43 AM   Specimen: BLOOD RIGHT HAND  Result Value Ref Range Status   Specimen Description   Final    BLOOD RIGHT HAND Performed at [facility], [address], [place], [place] [address]    Special Requests   Final    BOTTLES DRAWN AEROBIC AND ANAEROBIC Blood Culture results may not be optimal due to an inadequate volume of blood received in culture bottles Performed at [facility], [address] [place], [place] [address]    Culture  Setup Time   Final    GRAM POSITIVE COCCI IN CLUSTERS IN BOTH AEROBIC AND ANAEROBIC BOTTLES CRITICAL RESULT CALLED TO, READ BACK BY AND VERIFIED WITH: PHARMD NATHAN B 0104 [id number] FCP  Culture (A)  Final    STAPHYLOCOCCUS WARNERI THE SIGNIFICANCE OF ISOLATING THIS ORGANISM FROM A SINGLE SET OF BLOOD CULTURES WHEN MULTIPLE SETS ARE DRAWN IS UNCERTAIN. PLEASE NOTIFY THE MICROBIOLOGY DEPARTMENT WITHIN ONE WEEK IF SPECIATION AND SENSITIVITIES ARE REQUIRED. Performed at [facility], [address] [place], [place] [address]    Report Status 04/19/2024 FINAL  Final  Blood Culture ID Panel (Reflexed)     Status: Abnormal   Collection Time: 04/16/24  6:43 AM  Result Value Ref Range Status   Enterococcus faecalis NOT DETECTED NOT DETECTED Final   Enterococcus Faecium NOT DETECTED NOT DETECTED Final   Listeria monocytogenes NOT DETECTED NOT DETECTED Final   Staphylococcus species DETECTED (A) NOT DETECTED Final    Comment: CRITICAL RESULT CALLED TO, READ BACK BY AND VERIFIED WITH: PHARMD NATHAN B 0104 [id number] FCP    Staphylococcus aureus (BCID) NOT DETECTED NOT DETECTED Final   Staphylococcus epidermidis NOT DETECTED NOT DETECTED Final   Staphylococcus lugdunensis NOT DETECTED NOT DETECTED Final   Streptococcus  species NOT DETECTED NOT DETECTED Final   Streptococcus agalactiae NOT DETECTED NOT DETECTED Final   Streptococcus pneumoniae NOT DETECTED NOT DETECTED Final   Streptococcus pyogenes NOT DETECTED NOT DETECTED Final   A.calcoaceticus-baumannii NOT DETECTED NOT DETECTED Final   Bacteroides fragilis NOT DETECTED NOT DETECTED Final   Enterobacterales NOT DETECTED NOT DETECTED Final   Enterobacter cloacae complex NOT DETECTED NOT DETECTED Final   Escherichia coli NOT DETECTED NOT DETECTED Final   Klebsiella aerogenes NOT DETECTED NOT DETECTED Final   Klebsiella oxytoca NOT DETECTED NOT DETECTED Final   Klebsiella pneumoniae NOT DETECTED NOT DETECTED Final   Proteus species NOT DETECTED NOT DETECTED Final   Salmonella species NOT DETECTED NOT DETECTED Final   Serratia marcescens NOT DETECTED NOT DETECTED Final   Haemophilus influenzae NOT DETECTED NOT DETECTED Final   Neisseria meningitidis NOT DETECTED NOT DETECTED Final   Pseudomonas aeruginosa NOT DETECTED NOT DETECTED Final   Stenotrophomonas maltophilia NOT DETECTED NOT DETECTED Final   Candida albicans NOT DETECTED NOT DETECTED Final   Candida auris NOT DETECTED NOT DETECTED Final   Candida glabrata NOT DETECTED NOT DETECTED Final   Candida krusei NOT DETECTED NOT DETECTED Final   Candida parapsilosis NOT DETECTED NOT DETECTED Final   Candida tropicalis NOT DETECTED NOT DETECTED Final   Cryptococcus neoformans/gattii NOT DETECTED NOT DETECTED Final    Comment: Performed at [facility], [address], [place], [place] [address]  Resp panel by RT-PCR (RSV, Flu A&B, Covid) Anterior Nasal Swab     Status: None   Collection Time: 04/16/24  7:19 AM   Specimen: Anterior Nasal Swab  Result Value Ref Range Status   SARS Coronavirus 2 by RT PCR NEGATIVE NEGATIVE Final    Comment: (NOTE) SARS-CoV-2 target nucleic acids are NOT DETECTED.  The SARS-CoV-2 RNA is generally detectable in upper respiratory specimens during the acute phase  of infection. The lowest concentration of SARS-CoV-2 viral copies this assay can detect is 138 copies/mL. A negative result does not preclude SARS-Cov-2 infection and should not be used as the sole basis for treatment or other patient management decisions. A negative result may occur with  improper specimen collection/handling, submission of specimen other than nasopharyngeal swab, presence of viral mutation(s) within the areas targeted by this assay, and inadequate number of viral copies(<138 copies/mL). A negative result must be combined with clinical observations, patient history, and epidemiological information. The expected result is Negative.  Fact  Sheet for Patients:  BloggerCourse.com  Fact Sheet for Healthcare Providers:  SeriousBroker.it  This test is no t yet approved or cleared by the [place]  FDA and  has been authorized for detection and/or diagnosis of SARS-CoV-2 by FDA under an Emergency Use Authorization (EUA). This EUA will remain  in effect (meaning this test can be used) for the duration of the COVID-19 declaration under Section 564(b)(1) of the Act, 21 U.S.C.section 360bbb-3(b)(1), unless the authorization is terminated  or revoked sooner.       Influenza A by PCR NEGATIVE NEGATIVE Final   Influenza B by PCR NEGATIVE NEGATIVE Final    Comment: (NOTE) The Xpert Xpress SARS-CoV-2/FLU/RSV plus assay is intended as an aid in the diagnosis of influenza from Nasopharyngeal swab specimens and should not be used as a sole basis for treatment. Nasal washings and aspirates are unacceptable for Xpert Xpress SARS-CoV-2/FLU/RSV testing.  Fact Sheet for Patients: BloggerCourse.com  Fact Sheet for Healthcare Providers: SeriousBroker.it  This test is not yet approved or cleared by the [place]  FDA and has been authorized for detection and/or diagnosis of  SARS-CoV-2 by FDA under an Emergency Use Authorization (EUA). This EUA will remain in effect (meaning this test can be used) for the duration of the COVID-19 declaration under Section 564(b)(1) of the Act, 21 U.S.C. section 360bbb-3(b)(1), unless the authorization is terminated or revoked.     Resp Syncytial Virus by PCR NEGATIVE NEGATIVE Final    Comment: (NOTE) Fact Sheet for Patients: BloggerCourse.com  Fact Sheet for Healthcare Providers: SeriousBroker.it  This test is not yet approved or cleared by the [place]  FDA and has been authorized for detection and/or diagnosis of SARS-CoV-2 by FDA under an Emergency Use Authorization (EUA). This EUA will remain in effect (meaning this test can be used) for the duration of the COVID-19 declaration under Section 564(b)(1) of the Act, 21 U.S.C. section 360bbb-3(b)(1), unless the authorization is terminated or revoked.  Performed at [facility], [address] [place], [place] [address]   MRSA Next Gen by PCR, Nasal     Status: None   Collection Time: 04/16/24 12:52 PM   Specimen: Nasal Mucosa; Nasal Swab  Result Value Ref Range Status   MRSA by PCR Next Gen NOT DETECTED NOT DETECTED Final    Comment: (NOTE) The GeneXpert MRSA Assay (FDA approved for NASAL specimens only), is one component of a comprehensive MRSA colonization surveillance program. It is not intended to diagnose MRSA infection nor to guide or monitor treatment for MRSA infections. Test performance is not FDA approved in patients less than 39 years old. Performed at [facility], [address] [place], [place] [address]          Radiology Studies: No results found.       Scheduled Meds:  vitamin C   250 mg Oral BID   aspirin  EC  81 mg Oral Daily   enoxaparin  (LOVENOX ) injection  40 mg Subcutaneous Q24H   feeding supplement  237 mL Oral BID BM   ferrous sulfate   325 mg Oral  Q breakfast   furosemide   20 mg Oral Daily   lamoTRIgine   25 mg Oral BID   leptospermum manuka honey  1 Application Topical Daily   magnesium  oxide  400 mg Oral Daily   melatonin  5 mg Oral QHS   multivitamin with minerals  1 tablet Oral Daily   OLANZapine   5 mg Oral QHS   pantoprazole   40 mg Oral Daily  polyethylene glycol  34 g Oral Daily   senna-docusate  2 tablet Oral BID   sertraline   100 mg Oral Daily   traZODone   100 mg Oral QHS   Continuous Infusions:  cefTRIAXone  (ROCEPHIN )  IV 2 g (04/19/24 1221)     LOS: 3 days      Calvin KATHEE Robson, MD [facility]   If 7PM-7AM, please contact night-coverage  04/19/2024, 2:19 PM

## 2024-04-19 NOTE — Consult Note (Addendum)
 Consultation Note Date: 04/19/2024 at 1400  Patient Name: [name]  DOB: 09/12/1941  MRN: [id number]  Age / Sex: 83 y.o., female  PCP: Isaiah Leisure, MD Referring Physician: Jhonny Calvin NOVAK, MD  HPI/Patient Profile: 83 y.o. female  with past medical history of dementia, anxiety, bipolar disorder, GERD, and HLD admitted from [facility] on 04/16/2024 with fever and altered mental status with new oxygen requirements.  Patient is being treated for sepsis due to cellulitis of left lower extremity, pneumonia, and acute metabolic encephalopathy.  PMT was consulted to support patient with goals of care discussions.   Clinical Assessment and Goals of Care: Extensive chart review completed prior to meeting patient including labs, vital signs, imaging, progress notes, orders, and available advanced directive documents from current and previous encounters. I then met with patient at bedside.  He is awake and alert.  She is able to share with me her name but not reasons why she is in the hospital.  She does not know where she has or the circumstances under which she was brought to the hospital.  Patient is unable to participate in goals of care or medical decision making independently at this time.  Symptoms assessed.  Patient endorses pain everywhere.  In-depth pain assessment attempted to be completed.  Patient answered every question with no.  She is unable to pinpoint where her pain is on her body.  She is unable to give it a number.  After reviewing MAR, I scheduled Tylenol  3 times daily to assist with pain that is likely being caused by LLE cellulitis.  After visiting with patient, I attempted to speak with her DSS guardian over the phone.  No answer.  HIPAA compliant voicemail left with PMT contact info.  Updated attending on above.  Will continue to attempt goals of care discussions with DSS  when phone call is returned.  No change to plan of care at this time.  Primary Decision Maker LEGAL GUARDIAN  Physical Exam Vitals reviewed.  HENT:     Head: Normocephalic.     Nose: Nose normal.     Mouth/Throat:     Mouth: Mucous membranes are moist.     Comments: Missing teeth Cardiovascular:     Rate and Rhythm: Normal rate.     Pulses: Normal pulses.  Skin:    Coloration: Skin is pale.  Neurological:     Mental Status: She is alert.     Comments: Alert to self  Psychiatric:        Behavior: Behavior normal.     Palliative Assessment/Data: 30%     Thank you for this consult. Palliative medicine will continue to follow and assist holistically.   Time Total: 40 minutes  Time spent includes: Detailed review of medical records (labs, imaging, vital signs), medically appropriate exam (mental status, respiratory, cardiac, skin), discussed with treatment team, counseling and educating patient, family and staff, documenting clinical information, medication management and coordination of care.  Signed by: Lamarr Gunner, DNP, FNP-BC Palliative Medicine  Please contact Palliative Medicine Team providers via AMION for questions and concerns.

## 2024-04-19 NOTE — TOC Initial Note (Signed)
 Transition of Care [facility]) - Initial/Assessment Note    Patient Details  Name: [name] MRN: [id number] Date of Birth: May 15, 1941  Transition of Care [facility]) CM/SW Contact:    Corean ONEIDA Haddock, RN Phone Number: 04/19/2024, 11:03 AM  Clinical Narrative:                  Patient admitted from [place] memory care.  [facility] DSS guardian Per MD palliative consult pending for goals of care        Patient Goals and CMS Choice            Expected Discharge Plan and Services                                              Prior Living Arrangements/Services                       Activities of Daily Living   ADL Screening (condition at time of admission) Independently performs ADLs?: No Does the patient have a NEW difficulty with bathing/dressing/toileting/self-feeding that is expected to last >3 days?: No Does the patient have a NEW difficulty with getting in/out of bed, walking, or climbing stairs that is expected to last >3 days?: No Does the patient have a NEW difficulty with communication that is expected to last >3 days?: No Is the patient deaf or have difficulty hearing?: No Does the patient have difficulty seeing, even when wearing glasses/contacts?: No Does the patient have difficulty concentrating, remembering, or making decisions?: Yes  Permission Sought/Granted                  Emotional Assessment              Admission diagnosis:  Hypoxia [R09.02] Cellulitis of left lower extremity [L03.116] Sepsis (HCC) [A41.9] Wound of left lower extremity, subsequent encounter [S81.802D] Fever, unspecified fever cause [R50.9] Patient Active Problem List   Diagnosis Date Noted   Protein-calorie malnutrition, severe 04/18/2024   Sepsis (HCC) 04/16/2024   Atherosclerosis of native arteries of the extremities with ulceration (HCC) 03/11/2024   Atherosclerosis of 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 (HCC)  05/24/2023   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) 04/29/2023   Cellulitis 04/28/2023   HCAP (healthcare-associated pneumonia) 12/19/2020   PAD (peripheral artery disease) (HCC) 12/19/2020   HLD (hyperlipidemia) 12/19/2020   Insomnia 12/19/2020   GERD (gastroesophageal reflux disease) 12/19/2020   Bipolar disorder (HCC) 12/15/2020   Dementia with behavioral disturbance (HCC) 12/15/2020   SOB (shortness of breath) 12/12/2020   Acute respiratory failure with hypoxia (HCC) 12/12/2020   Community acquired pneumonia 12/12/2020   Pressure injury of skin 09/25/2019   Fall    Closed left subtrochanteric femur fracture (HCC) 09/21/2019   PCP:  Isaiah Leisure, MD Pharmacy:   [facility] [address] [place], [place] - [address] [place] [address] Phone: [phone] Fax: [phone]     Social Drivers of Health (SDOH) Social History: SDOH Screenings   Food Insecurity: No Food Insecurity (04/16/2024)  Housing: Low Risk  (04/16/2024)  Transportation Needs: No Transportation Needs (04/16/2024)  Utilities: Not At Risk (04/16/2024)  Financial Resource Strain: Low Risk  (09/22/2023)   Received from [facility]  Social Connections: Patient Unable To Answer (04/16/2024)  Tobacco Use: Medium Risk (04/16/2024)   SDOH  Interventions:     Readmission Risk Interventions     No data to display

## 2024-04-20 DIAGNOSIS — A419 Sepsis, unspecified organism: Secondary | ICD-10-CM | POA: Diagnosis not present

## 2024-04-20 DIAGNOSIS — L03116 Cellulitis of left lower limb: Secondary | ICD-10-CM | POA: Diagnosis not present

## 2024-04-20 DIAGNOSIS — R509 Fever, unspecified: Secondary | ICD-10-CM | POA: Diagnosis not present

## 2024-04-20 DIAGNOSIS — R0902 Hypoxemia: Secondary | ICD-10-CM | POA: Diagnosis not present

## 2024-04-20 DIAGNOSIS — S81802D Unspecified open wound, left lower leg, subsequent encounter: Secondary | ICD-10-CM | POA: Diagnosis not present

## 2024-04-20 MED ORDER — SODIUM CHLORIDE 0.9 % IV SOLN
2.0000 g | INTRAVENOUS | Status: AC
  Administered 2024-04-21 – 2024-04-22 (×2): 2 g via INTRAVENOUS
  Filled 2024-04-20 (×3): qty 20

## 2024-04-20 NOTE — Progress Notes (Signed)
 PT Cancellation Note  Patient Details Name: [name] MRN: [id number] DOB: 10-May-1941   Cancelled Treatment:    Reason Eval/Treat Not Completed: Other (comment). Pt's chart reviewed. Upon entry pt blankly staring, appears very fatigued. Does not respond to voice or make any attempt to acknowledge pt's presence. In EMR talks of possible hospice/comfort measures. PT to hold today and re-attempt as appropriate. Will follow EMR for updates on GOC.   Dorina HERO. Fairly IV, PT, DPT Physical Therapist- [place]  [facility] 04/20/2024, 1:57 PM

## 2024-04-20 NOTE — Progress Notes (Signed)
 PROGRESS NOTE    NAZARIA RIESEN  FMW:[id number] DOB: [birth year] DOA: 04/16/2024 PCP: Isaiah Leisure, MD    Brief Narrative:   From HPI [name] is a 83 y.o. female with medical history significant of dementia, anxiety disorder, bipolar, GERD, hyperlipidemia, who lives in a facility brought in with fever as well as altered mental status and new oxygen requirement.  Also found to have worsening of right lower extremity wounds with surrounding cellulitis.  At the time patient was being seen mental status had improved some, denies chest pain, nausea vomiting chest pain or abdominal pain. ED course: Upon arrival temperature 100.8, respiratory rate 20, pulse 79, blood pressure 131/66. Given concerns of sepsis patient was initiated on antibiotic therapy in the emergency room and hospitalist service was contacted to admit patient for further management      Assessment & Plan:   Principal Problem:   Sepsis (HCC) Active Problems:   Protein-calorie malnutrition, severe  Sepsis secondary to left lower extremity cellulitis as well as possible underlying pneumonia Patient met sepsis criteria on presentation with temperature 100.8, WBC 13.7 in the setting of cellulitis as well as pneumonia MRSA screen negative Plan: Continue Rocephin .  7 day stop date.  Appreciate WOC recommendations.  Nursing for wound care.   Acute hypoxic respiratory failure secondary to pneumonia Patient currently requiring 2 L of oxygen She does not use any oxygen at home Weaned to room air.  Continue antibiotics as above   Acute metabolic encephalopathy secondary to above Baseline mental status not clear at this time.  Continue to monitor   Dementia Continue to monitor closely   Anxiety disorder, bipolar,  Continue trazodone , sertraline  and olanzapine    GERD Continue PPI     Hyperlipidemia Continue statin therapy  Functional decline Patient with poor baseline.  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  Poor  family support.  Has a DSS guardian.  Quality of life has diminished.  I feel she is appropriate for further de-escalation of care and consideration for engagement of hospice services.  Palliative care consult requested.  Previous attempts to reach DSS guardian unsuccessful.    DVT prophylaxis: Lovenox  Code Status: DNR Family Communication: None Disposition Plan: Status is: Inpatient Remains inpatient appropriate because: Pneumonia on IV antibiotics   Level of care: Telemetry Medical  Consultants:  None  Procedures:  None  Antimicrobials: Ceftriaxone    Subjective: Seen and examined.  Flattened affect.  No complaints.  Objective: Vitals:   04/19/24 2033 04/20/24 0433 04/20/24 0500 04/20/24 0842  BP: (!) 109/44 121/62  123/76  Pulse: 83 88  88  Resp: 16 15  16   Temp: 98.1 F (36.7 C) 98.2 F (36.8 C)  99.2 F (37.3 C)  TempSrc:  Oral  Oral  SpO2: 92% 90%  90%  Weight:   50.9 kg   Height:        Intake/Output Summary (Last 24 hours) at 04/20/2024 1250 Last data filed at 04/20/2024 0900 Gross per 24 hour  Intake 0 ml  Output 500 ml  Net -500 ml   Filed Weights   04/18/24 0344 04/19/24 1039 04/20/24 0500  Weight: 53.3 kg 51.9 kg 50.9 kg    Examination:  General exam: Appears fatigued and chronically ill Respiratory system: Bibasilar crackles.  Normal work of breathing.  Room air Cardiovascular system: S1-S2, RRR, 2/6 heart murmur, normal bowel sounds Gastrointestinal system: Thin, soft, T/ND, normal bowel sounds Central nervous system: Alert and oriented. No focal neurological deficits. Extremities: Decreased power bilateral lower  extremities Skin: No rashes, lesions or ulcers Psychiatry: Judgement and insight appear impaired. Mood & affect flattened.     Data Reviewed: I have personally reviewed following labs and imaging studies  CBC: Recent Labs  Lab 04/16/24 0635 04/17/24 0413 04/18/24 0341  WBC 13.7* 9.7 10.8*  NEUTROABS 10.2*  --  7.1  HGB  9.0* 9.2* 8.6*  HCT 29.3* 30.4* 28.0*  MCV 83.5 83.1 81.6  PLT 499* 487* 500*   Basic Metabolic Panel: Recent Labs  Lab 04/16/24 0635 04/17/24 0413 04/18/24 0341  NA 141 139 137  K 3.7 3.4* 4.4  CL 107 106 108  CO2 25 26 25   GLUCOSE 103* 91 117*  BUN 20 15 17   CREATININE 0.65 0.59 0.54  CALCIUM  8.7* 8.9 8.9  MG  --   --  1.2*  PHOS  --   --  2.7   GFR: Estimated Creatinine Clearance: 42.1 mL/min (by C-G formula based on SCr of 0.54 mg/dL). Liver Function Tests: Recent Labs  Lab 04/16/24 0635  AST 23  ALT 16  ALKPHOS 70  BILITOT 0.5  PROT 5.7*  ALBUMIN 2.3*   No results for input(s): LIPASE, AMYLASE in the last 168 hours. No results for input(s): AMMONIA in the last 168 hours. Coagulation Profile: Recent Labs  Lab 04/16/24 0635  INR 1.0   Cardiac Enzymes: No results for input(s): CKTOTAL, CKMB, CKMBINDEX, TROPONINI in the last 168 hours. BNP (last 3 results) No results for input(s): PROBNP in the last 8760 hours. HbA1C: No results for input(s): HGBA1C in the last 72 hours. CBG: No results for input(s): GLUCAP in the last 168 hours. Lipid Profile: No results for input(s): CHOL, HDL, LDLCALC, TRIG, CHOLHDL, LDLDIRECT in the last 72 hours. Thyroid Function Tests: No results for input(s): TSH, T4TOTAL, FREET4, T3FREE, THYROIDAB in the last 72 hours. Anemia Panel: No results for input(s): VITAMINB12, FOLATE, FERRITIN, TIBC, IRON, RETICCTPCT in the last 72 hours. Sepsis Labs: Recent Labs  Lab 04/16/24 0636 04/18/24 0341  PROCALCITON  --  <0.10  LATICACIDVEN 0.9  --     Recent Results (from the past 240 hours)  Culture, blood (Routine x 2)     Status: None (Preliminary result)   Collection Time: 04/16/24  6:35 AM   Specimen: BLOOD LEFT ARM  Result Value Ref Range Status   Specimen Description BLOOD LEFT ARM  Final   Special Requests   Final    BOTTLES DRAWN AEROBIC AND ANAEROBIC Blood Culture  adequate volume   Culture   Final    NO GROWTH 4 DAYS Performed at [facility], [address] [place], [place] [address]    Report Status PENDING  Incomplete  Culture, blood (Routine x 2)     Status: Abnormal   Collection Time: 04/16/24  6:43 AM   Specimen: BLOOD RIGHT HAND  Result Value Ref Range Status   Specimen Description   Final    BLOOD RIGHT HAND Performed at [facility], [address] [place], [place] [address]    Special Requests   Final    BOTTLES DRAWN AEROBIC AND ANAEROBIC Blood Culture results may not be optimal due to an inadequate volume of blood received in culture bottles Performed at [facility], [address] [place], [place] [address]    Culture  Setup Time   Final    GRAM POSITIVE COCCI IN CLUSTERS IN BOTH AEROBIC AND ANAEROBIC BOTTLES CRITICAL RESULT CALLED TO, READ BACK BY AND VERIFIED WITH: PHARMD NATHAN B  [id number] FCP    Culture (A)  Final    STAPHYLOCOCCUS WARNERI THE SIGNIFICANCE OF ISOLATING THIS ORGANISM FROM A SINGLE SET OF BLOOD CULTURES WHEN MULTIPLE SETS ARE DRAWN IS UNCERTAIN. PLEASE NOTIFY THE MICROBIOLOGY DEPARTMENT WITHIN ONE WEEK IF SPECIATION AND SENSITIVITIES ARE REQUIRED. Performed at [facility], [address], [place], [place] [address]    Report Status 04/19/2024 FINAL  Final  Blood Culture ID Panel (Reflexed)     Status: Abnormal   Collection Time: 04/16/24  6:43 AM  Result Value Ref Range Status   Enterococcus faecalis NOT DETECTED NOT DETECTED Final   Enterococcus Faecium NOT DETECTED NOT DETECTED Final   Listeria monocytogenes NOT DETECTED NOT DETECTED Final   Staphylococcus species DETECTED (A) NOT DETECTED Final    Comment: CRITICAL RESULT CALLED TO, READ BACK BY AND VERIFIED WITH: PHARMD NATHAN B [id number] FCP    Staphylococcus aureus (BCID) NOT DETECTED NOT DETECTED Final   Staphylococcus epidermidis NOT DETECTED NOT DETECTED Final   Staphylococcus lugdunensis NOT DETECTED  NOT DETECTED Final   Streptococcus species NOT DETECTED NOT DETECTED Final   Streptococcus agalactiae NOT DETECTED NOT DETECTED Final   Streptococcus pneumoniae NOT DETECTED NOT DETECTED Final   Streptococcus pyogenes NOT DETECTED NOT DETECTED Final   A.calcoaceticus-baumannii NOT DETECTED NOT DETECTED Final   Bacteroides fragilis NOT DETECTED NOT DETECTED Final   Enterobacterales NOT DETECTED NOT DETECTED Final   Enterobacter cloacae complex NOT DETECTED NOT DETECTED Final   Escherichia coli NOT DETECTED NOT DETECTED Final   Klebsiella aerogenes NOT DETECTED NOT DETECTED Final   Klebsiella oxytoca NOT DETECTED NOT DETECTED Final   Klebsiella pneumoniae NOT DETECTED NOT DETECTED Final   Proteus species NOT DETECTED NOT DETECTED Final   Salmonella species NOT DETECTED NOT DETECTED Final   Serratia marcescens NOT DETECTED NOT DETECTED Final   Haemophilus influenzae NOT DETECTED NOT DETECTED Final   Neisseria meningitidis NOT DETECTED NOT DETECTED Final   Pseudomonas aeruginosa NOT DETECTED NOT DETECTED Final   Stenotrophomonas maltophilia NOT DETECTED NOT DETECTED Final   Candida albicans NOT DETECTED NOT DETECTED Final   Candida auris NOT DETECTED NOT DETECTED Final   Candida glabrata NOT DETECTED NOT DETECTED Final   Candida krusei NOT DETECTED NOT DETECTED Final   Candida parapsilosis NOT DETECTED NOT DETECTED Final   Candida tropicalis NOT DETECTED NOT DETECTED Final   Cryptococcus neoformans/gattii NOT DETECTED NOT DETECTED Final    Comment: Performed at [facility], [address], [place], [place] [address]  Resp panel by RT-PCR (RSV, Flu A&B, Covid) Anterior Nasal Swab     Status: None   Collection Time: 04/16/24  7:19 AM   Specimen: Anterior Nasal Swab  Result Value Ref Range Status   SARS Coronavirus 2 by RT PCR NEGATIVE NEGATIVE Final    Comment: (NOTE) SARS-CoV-2 target nucleic acids are NOT DETECTED.  The SARS-CoV-2 RNA is generally detectable in upper  respiratory specimens during the acute phase of infection. The lowest concentration of SARS-CoV-2 viral copies this assay can detect is 138 copies/mL. A negative result does not preclude SARS-Cov-2 infection and should not be used as the sole basis for treatment or other patient management decisions. A negative result may occur with  improper specimen collection/handling, submission of specimen other than nasopharyngeal swab, presence of viral mutation(s) within the areas targeted by this assay, and inadequate number of viral copies(<138 copies/mL). A negative result must be combined with clinical observations, patient history, and epidemiological information. The  expected result is Negative.  Fact Sheet for Patients:  BloggerCourse.com  Fact Sheet for Healthcare Providers:  SeriousBroker.it  This test is no t yet approved or cleared by the [place]  FDA and  has been authorized for detection and/or diagnosis of SARS-CoV-2 by FDA under an Emergency Use Authorization (EUA). This EUA will remain  in effect (meaning this test can be used) for the duration of the COVID-19 declaration under Section 564(b)(1) of the Act, 21 U.S.C.section 360bbb-3(b)(1), unless the authorization is terminated  or revoked sooner.       Influenza A by PCR NEGATIVE NEGATIVE Final   Influenza B by PCR NEGATIVE NEGATIVE Final    Comment: (NOTE) The Xpert Xpress SARS-CoV-2/FLU/RSV plus assay is intended as an aid in the diagnosis of influenza from Nasopharyngeal swab specimens and should not be used as a sole basis for treatment. Nasal washings and aspirates are unacceptable for Xpert Xpress SARS-CoV-2/FLU/RSV testing.  Fact Sheet for Patients: BloggerCourse.com  Fact Sheet for Healthcare Providers: SeriousBroker.it  This test is not yet approved or cleared by the [place]  FDA and has been  authorized for detection and/or diagnosis of SARS-CoV-2 by FDA under an Emergency Use Authorization (EUA). This EUA will remain in effect (meaning this test can be used) for the duration of the COVID-19 declaration under Section 564(b)(1) of the Act, 21 U.S.C. section 360bbb-3(b)(1), unless the authorization is terminated or revoked.     Resp Syncytial Virus by PCR NEGATIVE NEGATIVE Final    Comment: (NOTE) Fact Sheet for Patients: BloggerCourse.com  Fact Sheet for Healthcare Providers: SeriousBroker.it  This test is not yet approved or cleared by the [place]  FDA and has been authorized for detection and/or diagnosis of SARS-CoV-2 by FDA under an Emergency Use Authorization (EUA). This EUA will remain in effect (meaning this test can be used) for the duration of the COVID-19 declaration under Section 564(b)(1) of the Act, 21 U.S.C. section 360bbb-3(b)(1), unless the authorization is terminated or revoked.  Performed at [facility], [address] [place], [place] [address]   MRSA Next Gen by PCR, Nasal     Status: None   Collection Time: 04/16/24 12:52 PM   Specimen: Nasal Mucosa; Nasal Swab  Result Value Ref Range Status   MRSA by PCR Next Gen NOT DETECTED NOT DETECTED Final    Comment: (NOTE) The GeneXpert MRSA Assay (FDA approved for NASAL specimens only), is one component of a comprehensive MRSA colonization surveillance program. It is not intended to diagnose MRSA infection nor to guide or monitor treatment for MRSA infections. Test performance is not FDA approved in patients less than 61 years old. Performed at [facility], [address] [place], [place] [address]          Radiology Studies: No results found.       Scheduled Meds:  acetaminophen   650 mg Oral TID   vitamin C   250 mg Oral BID   aspirin  EC  81 mg Oral Daily   enoxaparin  (LOVENOX ) injection  40 mg Subcutaneous  Q24H   feeding supplement  237 mL Oral BID BM   ferrous sulfate   325 mg Oral Q breakfast   furosemide   20 mg Oral Daily   lamoTRIgine   25 mg Oral BID   leptospermum manuka honey  1 Application Topical Daily   magnesium  oxide  400 mg Oral Daily   melatonin  5 mg Oral QHS   multivitamin with minerals  1 tablet Oral Daily   OLANZapine   5 mg Oral QHS   pantoprazole   40 mg Oral Daily   polyethylene glycol  34 g Oral Daily   senna-docusate  2 tablet Oral BID   sertraline   100 mg Oral Daily   traZODone   100 mg Oral QHS   Continuous Infusions:  cefTRIAXone  (ROCEPHIN )  IV 2 g (04/20/24 0856)     LOS: 4 days      Calvin KATHEE Robson, MD [facility]   If 7PM-7AM, please contact night-coverage  04/20/2024, 12:50 PM

## 2024-04-20 NOTE — Plan of Care (Signed)
  Problem: Education: Goal: Knowledge of General Education information will improve Description: Including pain rating scale, medication(s)/side effects and non-pharmacologic comfort measures Outcome: Not Progressing   Problem: Health Behavior/Discharge Planning: Goal: Ability to manage health-related needs will improve Outcome: Not Progressing   Problem: Clinical Measurements: Goal: Ability to maintain clinical measurements within normal limits will improve Outcome: Not Progressing Goal: Will remain free from infection Outcome: Not Progressing Goal: Diagnostic test results will improve Outcome: Not Progressing Goal: Respiratory complications will improve Outcome: Not Progressing Goal: Cardiovascular complication will be avoided Outcome: Not Progressing   Problem: Activity: Goal: Risk for activity intolerance will decrease Outcome: Not Progressing   Problem: Nutrition: Goal: Adequate nutrition will be maintained Outcome: Not Progressing   Problem: Coping: Goal: Level of anxiety will decrease Outcome: Not Progressing   Problem: Elimination: Goal: Will not experience complications related to bowel motility Outcome: Not Progressing Goal: Will not experience complications related to urinary retention Outcome: Not Progressing   Problem: Pain Managment: Goal: General experience of comfort will improve and/or be controlled Outcome: Not Progressing   Problem: Safety: Goal: Ability to remain free from injury will improve Outcome: Not Progressing   Problem: Skin Integrity: Goal: Risk for impaired skin integrity will decrease Outcome: Not Progressing   Problem: Fluid Volume: Goal: Hemodynamic stability will improve Outcome: Not Progressing   Problem: Clinical Measurements: Goal: Diagnostic test results will improve Outcome: Not Progressing Goal: Signs and symptoms of infection will decrease Outcome: Not Progressing   Problem: Respiratory: Goal: Ability to maintain  adequate ventilation will improve Outcome: Not Progressing

## 2024-04-20 NOTE — TOC Progression Note (Signed)
 Transition of Care [facility]) - Progression Note    Patient Details  Name: [name] MRN: [id number] Date of Birth: Feb 09, 1941  [facility] CM/SW Contact  Corean ONEIDA Haddock, RN Phone Number: 04/20/2024, 12:59 PM  Clinical Narrative:     Notified that patient is active with [facility] for RN Palliative care awaiting response from DSS for goals of care.  Per MD patient hospice appropriate                     Expected Discharge Plan and Services                                               Social Drivers of Health (SDOH) Interventions SDOH Screenings   Food Insecurity: No Food Insecurity (04/16/2024)  Housing: Low Risk  (04/16/2024)  Transportation Needs: No Transportation Needs (04/16/2024)  Utilities: Not At Risk (04/16/2024)  Financial Resource Strain: Low Risk  (09/22/2023)   Received from [facility]  Social Connections: Patient Unable To Answer (04/16/2024)  Tobacco Use: Medium Risk (04/16/2024)    Readmission Risk Interventions     No data to display

## 2024-04-20 NOTE — Progress Notes (Signed)
                                                     Palliative Care Progress Note, Assessment & Plan   Patient Name: ADAMARY SAVARY       Date: 04/20/2024 DOB: 1940-12-05  Age: 83 y.o. MRN#: [id number] Attending Physician: Jhonny Calvin NOVAK, MD Primary Care Physician: Isaiah Leisure, MD Admit Date: 04/16/2024  Subjective: Patient is lying in bed on her right side.  She is asleep but easily awakens to my presence.  She makes eye contact with me but quickly closes her eyes.  No family or friends present during my visit.  HPI: 83 y.o. female  with past medical history of dementia, anxiety, bipolar disorder, GERD, and HLD admitted from [place] house memory care on 04/16/2024 with fever and altered mental status with new oxygen requirements.   Patient is being treated for sepsis due to cellulitis of left lower extremity, pneumonia, and acute metabolic encephalopathy.   PMT was consulted to support patient with goals of care discussions.   Summary of counseling/coordination of care: Extensive chart review completed prior to meeting patient including labs, vital signs, imaging, progress notes, orders, and available advanced directive documents from current and previous encounters.   After reviewing the patient's chart and assessing the patient at bedside, I spoke with patient in regards to symptom management and goals of care.   Patient declined to engage in symptom assessment.  Yesterday she would say yes/no and today she had no vocalizations.  She shook her head no wants when asked if she had any complaints.  No adjustment to marked needed at this time.  Multiple phone calls made to DSS guardians.  Awaiting response to continue goals of care discussions.  Physical Exam Vitals reviewed.  Constitutional:      Comments: Thin, frail  HENT:     Mouth/Throat:      Mouth: Mucous membranes are moist.  Eyes:     Pupils: Pupils are equal, round, and reactive to light.  Pulmonary:     Effort: Pulmonary effort is normal.  Abdominal:     Palpations: Abdomen is soft.  Skin:    General: Skin is warm and dry.     Coloration: Skin is pale.  Neurological:     Comments: nonverbal  Psychiatric:        Behavior: Behavior normal.             Total Time 25 minutes   Time spent includes: Detailed review of medical records (labs, imaging, vital signs), medically appropriate exam (mental status, respiratory, cardiac, skin), discussed with treatment team, counseling and educating patient, family and staff, documenting clinical information, medication management and coordination of care.  Lamarr L. Arvid, DNP, FNP-BC Palliative Medicine Team

## 2024-04-21 DIAGNOSIS — A419 Sepsis, unspecified organism: Secondary | ICD-10-CM | POA: Diagnosis not present

## 2024-04-21 DIAGNOSIS — R509 Fever, unspecified: Secondary | ICD-10-CM | POA: Diagnosis not present

## 2024-04-21 DIAGNOSIS — Z789 Other specified health status: Secondary | ICD-10-CM | POA: Diagnosis not present

## 2024-04-21 DIAGNOSIS — Z66 Do not resuscitate: Secondary | ICD-10-CM

## 2024-04-21 DIAGNOSIS — L03116 Cellulitis of left lower limb: Secondary | ICD-10-CM | POA: Diagnosis not present

## 2024-04-21 DIAGNOSIS — Z515 Encounter for palliative care: Secondary | ICD-10-CM | POA: Diagnosis not present

## 2024-04-21 LAB — CULTURE, BLOOD (ROUTINE X 2)
Culture: NO GROWTH
Special Requests: ADEQUATE

## 2024-04-21 LAB — GLUCOSE, CAPILLARY
Glucose-Capillary: 100 mg/dL — ABNORMAL HIGH (ref 70–99)
Glucose-Capillary: 227 mg/dL — ABNORMAL HIGH (ref 70–99)

## 2024-04-21 NOTE — Progress Notes (Signed)
 PROGRESS NOTE    [name]  FMW:[id number] DOB: Oct 11, 1940 DOA: 04/16/2024 PCP: Isaiah Leisure, MD    Brief Narrative:   From HPI [name] is a 83 y.o. female with medical history significant of dementia, anxiety disorder, bipolar, GERD, hyperlipidemia, who lives in a facility brought in with fever as well as altered mental status and new oxygen requirement.  Also found to have worsening of right lower extremity wounds with surrounding cellulitis.  At the time patient was being seen mental status had improved some, denies chest pain, nausea vomiting chest pain or abdominal pain.  ED course: Upon arrival temperature 100.8, respiratory rate 20, pulse 79, blood pressure 131/66. Given concerns of sepsis patient was initiated on antibiotic therapy in the emergency room and hospitalist service was contacted to admit patient for further management   Assessment & Plan:   Principal Problem:   Sepsis (HCC) Active Problems:   Protein-calorie malnutrition, severe  Sepsis secondary to left lower extremity cellulitis as well as possible underlying pneumonia Patient met sepsis criteria on presentation with temperature 100.8, WBC 13.7 in the setting of cellulitis as well as pneumonia MRSA screen negative Plan: Continue Rocephin .  7 day stop date.   Appreciate WOC recommendations.   Nursing for wound care.   Acute hypoxic respiratory failure secondary to pneumonia Patient currently requiring 2 L of oxygen She does not use any oxygen at home Weaned to room air.  Continue antibiotics as above   Acute metabolic encephalopathy secondary to above Baseline mental status not clear at this time.  Continue to monitor   Dementia Continue to monitor closely   Anxiety disorder, bipolar,  Continue trazodone , sertraline  and olanzapine    GERD Continue PPI     Hyperlipidemia Continue statin therapy  Functional decline Patient with poor baseline.  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   Poor family support.  Has a DSS guardian.  Quality of life has diminished.  I feel she is appropriate for further de-escalation of care and consideration for engagement of hospice services.  Palliative care consult requested.  Multiple attempts to reach DSS guardian have been unsuccessful    DVT prophylaxis: Lovenox  Code Status: DNR Family Communication: None Disposition Plan: Status is: Inpatient Remains inpatient appropriate because: Pneumonia on IV antibiotics   Level of care: Telemetry Medical  Consultants:  None  Procedures:  None  Antimicrobials: Ceftriaxone    Subjective: Seen and examined.  More lethargic this morning.  Offers no complaints.  Endorses fatigue  Objective: Vitals:   04/20/24 2013 04/21/24 0333 04/21/24 0745 04/21/24 0749  BP: 137/66 (!) 114/57  (!) 120/52  Pulse: 86 96  81  Resp: 16 16  18   Temp: 98.5 F (36.9 C) 98.4 F (36.9 C)  98.2 F (36.8 C)  TempSrc:  Oral    SpO2: 93% 93%  94%  Weight:   50.2 kg   Height:        Intake/Output Summary (Last 24 hours) at 04/21/2024 1143 Last data filed at 04/21/2024 0900 Gross per 24 hour  Intake 0 ml  Output --  Net 0 ml   Filed Weights   04/19/24 1039 04/20/24 0500 04/21/24 0745  Weight: 51.9 kg 50.9 kg 50.2 kg    Examination:  General exam: Fatigued and chronically ill-appearing Respiratory system: Bibasilar crackles.  Normal work of breathing.  Room air Cardiovascular system: S1-S2, RRR, 2/6 heart murmur, normal bowel sounds Gastrointestinal system: Thin, soft, T/ND, normal bowel sounds Central nervous system: Alert and oriented. No focal  neurological deficits. Extremities: Decreased power bilateral lower extremities Skin: No rashes, lesions or ulcers Psychiatry: Judgement and insight appear impaired. Mood & affect flattened.     Data Reviewed: I have personally reviewed following labs and imaging studies  CBC: Recent Labs  Lab 04/16/24 0635 04/17/24 0413 04/18/24 0341  WBC 13.7*  9.7 10.8*  NEUTROABS 10.2*  --  7.1  HGB 9.0* 9.2* 8.6*  HCT 29.3* 30.4* 28.0*  MCV 83.5 83.1 81.6  PLT 499* 487* 500*   Basic Metabolic Panel: Recent Labs  Lab 04/16/24 0635 04/17/24 0413 04/18/24 0341  NA 141 139 137  K 3.7 3.4* 4.4  CL 107 106 108  CO2 25 26 25   GLUCOSE 103* 91 117*  BUN 20 15 17   CREATININE 0.65 0.59 0.54  CALCIUM  8.7* 8.9 8.9  MG  --   --  1.2*  PHOS  --   --  2.7   GFR: Estimated Creatinine Clearance: 41.5 mL/min (by C-G formula based on SCr of 0.54 mg/dL). Liver Function Tests: Recent Labs  Lab 04/16/24 0635  AST 23  ALT 16  ALKPHOS 70  BILITOT 0.5  PROT 5.7*  ALBUMIN 2.3*   No results for input(s): LIPASE, AMYLASE in the last 168 hours. No results for input(s): AMMONIA in the last 168 hours. Coagulation Profile: Recent Labs  Lab 04/16/24 0635  INR 1.0   Cardiac Enzymes: No results for input(s): CKTOTAL, CKMB, CKMBINDEX, TROPONINI in the last 168 hours. BNP (last 3 results) No results for input(s): PROBNP in the last 8760 hours. HbA1C: No results for input(s): HGBA1C in the last 72 hours. CBG: Recent Labs  Lab 04/21/24 0337  GLUCAP 100*   Lipid Profile: No results for input(s): CHOL, HDL, LDLCALC, TRIG, CHOLHDL, LDLDIRECT in the last 72 hours. Thyroid Function Tests: No results for input(s): TSH, T4TOTAL, FREET4, T3FREE, THYROIDAB in the last 72 hours. Anemia Panel: No results for input(s): VITAMINB12, FOLATE, FERRITIN, TIBC, IRON, RETICCTPCT in the last 72 hours. Sepsis Labs: Recent Labs  Lab 04/16/24 0636 04/18/24 0341  PROCALCITON  --  <0.10  LATICACIDVEN 0.9  --     Recent Results (from the past 240 hours)  Culture, blood (Routine x 2)     Status: None   Collection Time: 04/16/24  6:35 AM   Specimen: BLOOD LEFT ARM  Result Value Ref Range Status   Specimen Description BLOOD LEFT ARM  Final   Special Requests   Final    BOTTLES DRAWN AEROBIC AND ANAEROBIC Blood  Culture adequate volume   Culture   Final    NO GROWTH 5 DAYS Performed at [facility], [address], [place], [place] [address]    Report Status 04/21/2024 FINAL  Final  Culture, blood (Routine x 2)     Status: Abnormal   Collection Time: 04/16/24  6:43 AM   Specimen: BLOOD RIGHT HAND  Result Value Ref Range Status   Specimen Description   Final    BLOOD RIGHT HAND Performed at [facility], [address], [place], [place] [address]    Special Requests   Final    BOTTLES DRAWN AEROBIC AND ANAEROBIC Blood Culture results may not be optimal due to an inadequate volume of blood received in culture bottles Performed at [facility], [address] [place], [place] [address]    Culture  Setup Time   Final    GRAM POSITIVE COCCI IN CLUSTERS IN BOTH AEROBIC AND ANAEROBIC BOTTLES CRITICAL RESULT CALLED TO, READ BACK  BY AND VERIFIED WITH: PHARMD NATHAN B 0104 [id number] FCP    Culture (A)  Final    STAPHYLOCOCCUS WARNERI THE SIGNIFICANCE OF ISOLATING THIS ORGANISM FROM A SINGLE SET OF BLOOD CULTURES WHEN MULTIPLE SETS ARE DRAWN IS UNCERTAIN. PLEASE NOTIFY THE MICROBIOLOGY DEPARTMENT WITHIN ONE WEEK IF SPECIATION AND SENSITIVITIES ARE REQUIRED. Performed at [facility], [address] [place], [place] [address]    Report Status 04/19/2024 FINAL  Final  Blood Culture ID Panel (Reflexed)     Status: Abnormal   Collection Time: 04/16/24  6:43 AM  Result Value Ref Range Status   Enterococcus faecalis NOT DETECTED NOT DETECTED Final   Enterococcus Faecium NOT DETECTED NOT DETECTED Final   Listeria monocytogenes NOT DETECTED NOT DETECTED Final   Staphylococcus species DETECTED (A) NOT DETECTED Final    Comment: CRITICAL RESULT CALLED TO, READ BACK BY AND VERIFIED WITH: PHARMD NATHAN B 0104 [id number] FCP    Staphylococcus aureus (BCID) NOT DETECTED NOT DETECTED Final   Staphylococcus epidermidis NOT DETECTED NOT DETECTED Final   Staphylococcus lugdunensis  NOT DETECTED NOT DETECTED Final   Streptococcus species NOT DETECTED NOT DETECTED Final   Streptococcus agalactiae NOT DETECTED NOT DETECTED Final   Streptococcus pneumoniae NOT DETECTED NOT DETECTED Final   Streptococcus pyogenes NOT DETECTED NOT DETECTED Final   A.calcoaceticus-baumannii NOT DETECTED NOT DETECTED Final   Bacteroides fragilis NOT DETECTED NOT DETECTED Final   Enterobacterales NOT DETECTED NOT DETECTED Final   Enterobacter cloacae complex NOT DETECTED NOT DETECTED Final   Escherichia coli NOT DETECTED NOT DETECTED Final   Klebsiella aerogenes NOT DETECTED NOT DETECTED Final   Klebsiella oxytoca NOT DETECTED NOT DETECTED Final   Klebsiella pneumoniae NOT DETECTED NOT DETECTED Final   Proteus species NOT DETECTED NOT DETECTED Final   Salmonella species NOT DETECTED NOT DETECTED Final   Serratia marcescens NOT DETECTED NOT DETECTED Final   Haemophilus influenzae NOT DETECTED NOT DETECTED Final   Neisseria meningitidis NOT DETECTED NOT DETECTED Final   Pseudomonas aeruginosa NOT DETECTED NOT DETECTED Final   Stenotrophomonas maltophilia NOT DETECTED NOT DETECTED Final   Candida albicans NOT DETECTED NOT DETECTED Final   Candida auris NOT DETECTED NOT DETECTED Final   Candida glabrata NOT DETECTED NOT DETECTED Final   Candida krusei NOT DETECTED NOT DETECTED Final   Candida parapsilosis NOT DETECTED NOT DETECTED Final   Candida tropicalis NOT DETECTED NOT DETECTED Final   Cryptococcus neoformans/gattii NOT DETECTED NOT DETECTED Final    Comment: Performed at [facility], [address] [place], [place] [address]  Resp panel by RT-PCR (RSV, Flu A&B, Covid) Anterior Nasal Swab     Status: None   Collection Time: 04/16/24  7:19 AM   Specimen: Anterior Nasal Swab  Result Value Ref Range Status   SARS Coronavirus 2 by RT PCR NEGATIVE NEGATIVE Final    Comment: (NOTE) SARS-CoV-2 target nucleic acids are NOT DETECTED.  The SARS-CoV-2 RNA is generally detectable in  upper respiratory specimens during the acute phase of infection. The lowest concentration of SARS-CoV-2 viral copies this assay can detect is 138 copies/mL. A negative result does not preclude SARS-Cov-2 infection and should not be used as the sole basis for treatment or other patient management decisions. A negative result may occur with  improper specimen collection/handling, submission of specimen other than nasopharyngeal swab, presence of viral mutation(s) within the areas targeted by this assay, and inadequate number of viral copies(<138 copies/mL). A negative result must be combined with clinical  observations, patient history, and epidemiological information. The expected result is Negative.  Fact Sheet for Patients:  BloggerCourse.com  Fact Sheet for Healthcare Providers:  SeriousBroker.it  This test is no t yet approved or cleared by the [place]  FDA and  has been authorized for detection and/or diagnosis of SARS-CoV-2 by FDA under an Emergency Use Authorization (EUA). This EUA will remain  in effect (meaning this test can be used) for the duration of the COVID-19 declaration under Section 564(b)(1) of the Act, 21 U.S.C.section 360bbb-3(b)(1), unless the authorization is terminated  or revoked sooner.       Influenza A by PCR NEGATIVE NEGATIVE Final   Influenza B by PCR NEGATIVE NEGATIVE Final    Comment: (NOTE) The Xpert Xpress SARS-CoV-2/FLU/RSV plus assay is intended as an aid in the diagnosis of influenza from Nasopharyngeal swab specimens and should not be used as a sole basis for treatment. Nasal washings and aspirates are unacceptable for Xpert Xpress SARS-CoV-2/FLU/RSV testing.  Fact Sheet for Patients: BloggerCourse.com  Fact Sheet for Healthcare Providers: SeriousBroker.it  This test is not yet approved or cleared by the [place]  FDA and has been  authorized for detection and/or diagnosis of SARS-CoV-2 by FDA under an Emergency Use Authorization (EUA). This EUA will remain in effect (meaning this test can be used) for the duration of the COVID-19 declaration under Section 564(b)(1) of the Act, 21 U.S.C. section 360bbb-3(b)(1), unless the authorization is terminated or revoked.     Resp Syncytial Virus by PCR NEGATIVE NEGATIVE Final    Comment: (NOTE) Fact Sheet for Patients: BloggerCourse.com  Fact Sheet for Healthcare Providers: SeriousBroker.it  This test is not yet approved or cleared by the [place]  FDA and has been authorized for detection and/or diagnosis of SARS-CoV-2 by FDA under an Emergency Use Authorization (EUA). This EUA will remain in effect (meaning this test can be used) for the duration of the COVID-19 declaration under Section 564(b)(1) of the Act, 21 U.S.C. section 360bbb-3(b)(1), unless the authorization is terminated or revoked.  Performed at [facility], [address] [place], [place] [address]   MRSA Next Gen by PCR, Nasal     Status: None   Collection Time: 04/16/24 12:52 PM   Specimen: Nasal Mucosa; Nasal Swab  Result Value Ref Range Status   MRSA by PCR Next Gen NOT DETECTED NOT DETECTED Final    Comment: (NOTE) The GeneXpert MRSA Assay (FDA approved for NASAL specimens only), is one component of a comprehensive MRSA colonization surveillance program. It is not intended to diagnose MRSA infection nor to guide or monitor treatment for MRSA infections. Test performance is not FDA approved in patients less than 6 years old. Performed at [facility], [address], [place], [place] [address]          Radiology Studies: No results found.       Scheduled Meds:  acetaminophen   650 mg Oral TID   vitamin C   250 mg Oral BID   aspirin  EC  81 mg Oral Daily   enoxaparin  (LOVENOX ) injection  40 mg Subcutaneous  Q24H   feeding supplement  237 mL Oral BID BM   ferrous sulfate   325 mg Oral Q breakfast   furosemide   20 mg Oral Daily   lamoTRIgine   25 mg Oral BID   leptospermum manuka honey  1 Application Topical Daily   magnesium  oxide  400 mg Oral Daily   melatonin  5 mg Oral QHS   multivitamin with minerals  1  tablet Oral Daily   OLANZapine   5 mg Oral QHS   pantoprazole   40 mg Oral Daily   polyethylene glycol  34 g Oral Daily   senna-docusate  2 tablet Oral BID   sertraline   100 mg Oral Daily   traZODone   100 mg Oral QHS   Continuous Infusions:  cefTRIAXone  (ROCEPHIN )  IV 2 g (04/21/24 0825)     LOS: 5 days      Calvin KATHEE Robson, MD [facility]   If 7PM-7AM, please contact night-coverage  04/21/2024, 11:43 AM

## 2024-04-21 NOTE — Plan of Care (Signed)

## 2024-04-21 NOTE — Progress Notes (Signed)
                                                     Palliative Care Progress Note, Assessment & Plan   Patient Name: [name]       Date: 04/21/2024 DOB: 1941/09/09  Age: 83 y.o. MRN#: [id number] Attending Physician: Jhonny Calvin NOVAK, MD Primary Care Physician: Isaiah Leisure, MD Admit Date: 04/16/2024  Subjective: Endorses left leg pain.  Patient reports no appetite and has not eaten.  States I just want to sleep.  She denies CP or shortness of breath.  HPI:  Per previous HPI: 83 y.o. female  with past medical history of dementia, anxiety, bipolar disorder, GERD, and HLD admitted from [facility] care on 04/16/2024 with fever and altered mental status with new oxygen requirements.   Patient is being treated for sepsis due to cellulitis of left lower extremity, pneumonia, and acute metabolic encephalopathy.   PMT was consulted to support patient with goals of care discussions.   Summary of counseling/coordination of care: Extensive chart review completed prior to meeting patient including labs, vital signs, imaging, progress notes, orders, and available advanced directive documents from current and previous encounters.   After reviewing the patient's chart and assessing the patient at bedside, I spoke with patient in regards to symptom management and goals of care.   Elderly, ill-appearing female lying in bed.  She is alert and able to engage in conversation.  She is oriented to self.  Patient incorrectly states she is at [facility]. When asked about the year, she responds I'm not sure about that. When questioned about the current president, she states I don't know that. Even, unlabored respirations. She is in no distress.   Several phone calls have been placed to this patient's DSS LG by PMT colleague, Lamarr. Currently awaiting  return call for continuing goals of care conversations. Patient is stable with no immediate/urgent needs at this time.    Physical Exam Vitals reviewed.  Constitutional:      General: She is not in acute distress.    Appearance: She is ill-appearing.  HENT:     Head: Normocephalic and atraumatic.  Pulmonary:     Effort: Pulmonary effort is normal. No respiratory distress.  Musculoskeletal:     Comments: Dressing intact to LLE  Skin:    General: Skin is warm and dry.  Neurological:     Mental Status: She is alert. She is disoriented.  Psychiatric:        Mood and Affect: Affect is flat.   Recommendations/Plan: Continue DNR/DNI status as previously documented    Continue current supportive interventions PMT will continue to follow for further GOC discussion with DSS LG  Total Time 50 minutes   Time spent includes: Detailed review of medical records (labs, imaging, vital signs), medically appropriate exam (mental status, respiratory, cardiac, skin), discussed with treatment team, counseling and educating patient, family and staff, documenting clinical information, medication management and coordination of care.     Devere Sacks, AMANDA [facility] Palliative Medicine Team  04/21/2024 8:43 AM  Office [phone]  Pager [phone]

## 2024-04-22 DIAGNOSIS — Z66 Do not resuscitate: Secondary | ICD-10-CM | POA: Diagnosis not present

## 2024-04-22 DIAGNOSIS — R509 Fever, unspecified: Secondary | ICD-10-CM | POA: Diagnosis not present

## 2024-04-22 DIAGNOSIS — Z789 Other specified health status: Secondary | ICD-10-CM | POA: Diagnosis not present

## 2024-04-22 DIAGNOSIS — A419 Sepsis, unspecified organism: Secondary | ICD-10-CM | POA: Diagnosis not present

## 2024-04-22 DIAGNOSIS — Z515 Encounter for palliative care: Secondary | ICD-10-CM | POA: Diagnosis not present

## 2024-04-22 NOTE — Progress Notes (Signed)
 PROGRESS NOTE    [name]  FMW:[id number] DOB: 11-06-1940 DOA: 04/16/2024 PCP: Isaiah Leisure, MD    Brief Narrative:   From HPI [name] is a 83 y.o. female with medical history significant of dementia, anxiety disorder, bipolar, GERD, hyperlipidemia, who lives in a facility brought in with fever as well as altered mental status and new oxygen requirement.  Also found to have worsening of right lower extremity wounds with surrounding cellulitis.  At the time patient was being seen mental status had improved some, denies chest pain, nausea vomiting chest pain or abdominal pain.  ED course: Upon arrival temperature 100.8, respiratory rate 20, pulse 79, blood pressure 131/66. Given concerns of sepsis patient was initiated on antibiotic therapy in the emergency room and hospitalist service was contacted to admit patient for further management   Assessment & Plan:   Principal Problem:   Sepsis (HCC) Active Problems:   Protein-calorie malnutrition, severe  Sepsis secondary to left lower extremity cellulitis as well as possible underlying pneumonia Patient met sepsis criteria on presentation with temperature 100.8, WBC 13.7 in the setting of cellulitis as well as pneumonia MRSA screen negative Plan: Complete a 7-day course of Rocephin .  No antibiotics indicated at this time.  Appreciate WOC recommendations.  Continue nursing for wound care.   Acute hypoxic respiratory failure secondary to pneumonia Patient currently requiring 2 L of oxygen She does not use any oxygen at home Weaned to room air.  Antibiotics as above   Acute metabolic encephalopathy secondary to above Baseline mental status not clear at this time.  Continue to monitor   Dementia Continue to monitor closely   Anxiety disorder, bipolar,  Continue trazodone , sertraline  and olanzapine    GERD Continue PPI     Hyperlipidemia Continue statin therapy  Functional decline Patient with poor baseline.  Lives  full-time in memory care unit.  Poor family support.  Has a DSS guardian.  Quality of life has diminished.  I feel she is appropriate for further de-escalation of care and consideration for engagement of hospice services.  Palliative care consult requested.  Multiple attempts to reach DSS guardian have been unsuccessful.  Will attempt an additional time to reach DSS guardian on Monday 8/11.  If unsuccessful we may be a obligated to return patient to her previous memory care unit with outpatient palliative referral.3    DVT prophylaxis: Lovenox  Code Status: DNR Family Communication: None Disposition Plan: Status is: Inpatient Remains inpatient appropriate because: Pneumonia on IV antibiotics   Level of care: Telemetry Medical  Consultants:  None  Procedures:  None  Antimicrobials:   Subjective: Seen and examined.  Slightly more awake this morning.  No complaints.  Continues to endorse fatigue. Objective: Vitals:   04/21/24 2002 04/22/24 0355 04/22/24 0451 04/22/24 0825  BP: (!) 117/53 (!) 128/57  134/67  Pulse: (!) 109 93  93  Resp: 20 16  16   Temp: 99.1 F (37.3 C) 97.6 F (36.4 C)  98.9 F (37.2 C)  TempSrc: Oral Oral    SpO2: 91% 94%  97%  Weight:   48.1 kg   Height:        Intake/Output Summary (Last 24 hours) at 04/22/2024 1202 Last data filed at 04/22/2024 1100 Gross per 24 hour  Intake 480 ml  Output --  Net 480 ml   Filed Weights   04/20/24 0500 04/21/24 0745 04/22/24 0451  Weight: 50.9 kg 50.2 kg 48.1 kg    Examination:  General exam: Appears fatigued.  Chronically ill Respiratory system: Bibasilar crackles.  Normal work of breathing.  Room air Cardiovascular system: S1-S2, RRR, 2/6 heart murmur, normal bowel sounds Gastrointestinal system: Thin, soft, T/ND, normal bowel sounds Central nervous system: Alert and oriented. No focal neurological deficits. Extremities: Decreased power bilateral lower extremities Skin: No rashes, lesions or  ulcers Psychiatry: Judgement and insight appear impaired. Mood & affect flattened.     Data Reviewed: I have personally reviewed following labs and imaging studies  CBC: Recent Labs  Lab 04/16/24 0635 04/17/24 0413 04/18/24 0341  WBC 13.7* 9.7 10.8*  NEUTROABS 10.2*  --  7.1  HGB 9.0* 9.2* 8.6*  HCT 29.3* 30.4* 28.0*  MCV 83.5 83.1 81.6  PLT 499* 487* 500*   Basic Metabolic Panel: Recent Labs  Lab 04/16/24 0635 04/17/24 0413 04/18/24 0341  NA 141 139 137  K 3.7 3.4* 4.4  CL 107 106 108  CO2 25 26 25   GLUCOSE 103* 91 117*  BUN 20 15 17   CREATININE 0.65 0.59 0.54  CALCIUM  8.7* 8.9 8.9  MG  --   --  1.2*  PHOS  --   --  2.7   GFR: Estimated Creatinine Clearance: 39.7 mL/min (by C-G formula based on SCr of 0.54 mg/dL). Liver Function Tests: Recent Labs  Lab 04/16/24 0635  AST 23  ALT 16  ALKPHOS 70  BILITOT 0.5  PROT 5.7*  ALBUMIN 2.3*   No results for input(s): LIPASE, AMYLASE in the last 168 hours. No results for input(s): AMMONIA in the last 168 hours. Coagulation Profile: Recent Labs  Lab 04/16/24 0635  INR 1.0   Cardiac Enzymes: No results for input(s): CKTOTAL, CKMB, CKMBINDEX, TROPONINI in the last 168 hours. BNP (last 3 results) No results for input(s): PROBNP in the last 8760 hours. HbA1C: No results for input(s): HGBA1C in the last 72 hours. CBG: Recent Labs  Lab 04/21/24 0337 04/21/24 2032  GLUCAP 100* 227*   Lipid Profile: No results for input(s): CHOL, HDL, LDLCALC, TRIG, CHOLHDL, LDLDIRECT in the last 72 hours. Thyroid Function Tests: No results for input(s): TSH, T4TOTAL, FREET4, T3FREE, THYROIDAB in the last 72 hours. Anemia Panel: No results for input(s): VITAMINB12, FOLATE, FERRITIN, TIBC, IRON, RETICCTPCT in the last 72 hours. Sepsis Labs: Recent Labs  Lab 04/16/24 0636 04/18/24 0341  PROCALCITON  --  <0.10  LATICACIDVEN 0.9  --     Recent Results (from the past 240  hours)  Culture, blood (Routine x 2)     Status: None   Collection Time: 04/16/24  6:35 AM   Specimen: BLOOD LEFT ARM  Result Value Ref Range Status   Specimen Description BLOOD LEFT ARM  Final   Special Requests   Final    BOTTLES DRAWN AEROBIC AND ANAEROBIC Blood Culture adequate volume   Culture   Final    NO GROWTH 5 DAYS Performed at [facility], [address] [place], [place] [address]    Report Status 04/21/2024 FINAL  Final  Culture, blood (Routine x 2)     Status: Abnormal   Collection Time: 04/16/24  6:43 AM   Specimen: BLOOD RIGHT HAND  Result Value Ref Range Status   Specimen Description   Final    BLOOD RIGHT HAND Performed at [facility], [address], [place], [place] [address]    Special Requests   Final    BOTTLES DRAWN AEROBIC AND ANAEROBIC Blood Culture results may not be optimal due to an inadequate volume of blood received in culture  bottles Performed at [facility], [address] [place], [place] [address]    Culture  Setup Time   Final    GRAM POSITIVE COCCI IN CLUSTERS IN BOTH AEROBIC AND ANAEROBIC BOTTLES CRITICAL RESULT CALLED TO, READ BACK BY AND VERIFIED WITH: PHARMD NATHAN B [id number] FCP    Culture (A)  Final    STAPHYLOCOCCUS WARNERI THE SIGNIFICANCE OF ISOLATING THIS ORGANISM FROM A SINGLE SET OF BLOOD CULTURES WHEN MULTIPLE SETS ARE DRAWN IS UNCERTAIN. PLEASE NOTIFY THE MICROBIOLOGY DEPARTMENT WITHIN ONE WEEK IF SPECIATION AND SENSITIVITIES ARE REQUIRED. Performed at [facility], [address], [place], [place] [address]    Report Status 04/19/2024 FINAL  Final  Blood Culture ID Panel (Reflexed)     Status: Abnormal   Collection Time: 04/16/24  6:43 AM  Result Value Ref Range Status   Enterococcus faecalis NOT DETECTED NOT DETECTED Final   Enterococcus Faecium NOT DETECTED NOT DETECTED Final   Listeria monocytogenes NOT DETECTED NOT DETECTED Final   Staphylococcus species DETECTED (A) NOT  DETECTED Final    Comment: CRITICAL RESULT CALLED TO, READ BACK BY AND VERIFIED WITH: PHARMD NATHAN B [id number] FCP    Staphylococcus aureus (BCID) NOT DETECTED NOT DETECTED Final   Staphylococcus epidermidis NOT DETECTED NOT DETECTED Final   Staphylococcus lugdunensis NOT DETECTED NOT DETECTED Final   Streptococcus species NOT DETECTED NOT DETECTED Final   Streptococcus agalactiae NOT DETECTED NOT DETECTED Final   Streptococcus pneumoniae NOT DETECTED NOT DETECTED Final   Streptococcus pyogenes NOT DETECTED NOT DETECTED Final   A.calcoaceticus-baumannii NOT DETECTED NOT DETECTED Final   Bacteroides fragilis NOT DETECTED NOT DETECTED Final   Enterobacterales NOT DETECTED NOT DETECTED Final   Enterobacter cloacae complex NOT DETECTED NOT DETECTED Final   Escherichia coli NOT DETECTED NOT DETECTED Final   Klebsiella aerogenes NOT DETECTED NOT DETECTED Final   Klebsiella oxytoca NOT DETECTED NOT DETECTED Final   Klebsiella pneumoniae NOT DETECTED NOT DETECTED Final   Proteus species NOT DETECTED NOT DETECTED Final   Salmonella species NOT DETECTED NOT DETECTED Final   Serratia marcescens NOT DETECTED NOT DETECTED Final   Haemophilus influenzae NOT DETECTED NOT DETECTED Final   Neisseria meningitidis NOT DETECTED NOT DETECTED Final   Pseudomonas aeruginosa NOT DETECTED NOT DETECTED Final   Stenotrophomonas maltophilia NOT DETECTED NOT DETECTED Final   Candida albicans NOT DETECTED NOT DETECTED Final   Candida auris NOT DETECTED NOT DETECTED Final   Candida glabrata NOT DETECTED NOT DETECTED Final   Candida krusei NOT DETECTED NOT DETECTED Final   Candida parapsilosis NOT DETECTED NOT DETECTED Final   Candida tropicalis NOT DETECTED NOT DETECTED Final   Cryptococcus neoformans/gattii NOT DETECTED NOT DETECTED Final    Comment: Performed at [facility], [address], [place], [place] [address]  Resp panel by RT-PCR (RSV, Flu A&B, Covid) Anterior Nasal Swab     Status: None    Collection Time: 04/16/24  7:19 AM   Specimen: Anterior Nasal Swab  Result Value Ref Range Status   SARS Coronavirus 2 by RT PCR NEGATIVE NEGATIVE Final    Comment: (NOTE) SARS-CoV-2 target nucleic acids are NOT DETECTED.  The SARS-CoV-2 RNA is generally detectable in upper respiratory specimens during the acute phase of infection. The lowest concentration of SARS-CoV-2 viral copies this assay can detect is 138 copies/mL. A negative result does not preclude SARS-Cov-2 infection and should not be used as the sole basis for treatment or other patient management decisions.  A negative result may occur with  improper specimen collection/handling, submission of specimen other than nasopharyngeal swab, presence of viral mutation(s) within the areas targeted by this assay, and inadequate number of viral copies(<138 copies/mL). A negative result must be combined with clinical observations, patient history, and epidemiological information. The expected result is Negative.  Fact Sheet for Patients:  BloggerCourse.com  Fact Sheet for Healthcare Providers:  SeriousBroker.it  This test is no t yet approved or cleared by the [place]  FDA and  has been authorized for detection and/or diagnosis of SARS-CoV-2 by FDA under an Emergency Use Authorization (EUA). This EUA will remain  in effect (meaning this test can be used) for the duration of the COVID-19 declaration under Section 564(b)(1) of the Act, 21 U.S.C.section 360bbb-3(b)(1), unless the authorization is terminated  or revoked sooner.       Influenza A by PCR NEGATIVE NEGATIVE Final   Influenza B by PCR NEGATIVE NEGATIVE Final    Comment: (NOTE) The Xpert Xpress SARS-CoV-2/FLU/RSV plus assay is intended as an aid in the diagnosis of influenza from Nasopharyngeal swab specimens and should not be used as a sole basis for treatment. Nasal washings and aspirates are unacceptable for  Xpert Xpress SARS-CoV-2/FLU/RSV testing.  Fact Sheet for Patients: BloggerCourse.com  Fact Sheet for Healthcare Providers: SeriousBroker.it  This test is not yet approved or cleared by the [place]  FDA and has been authorized for detection and/or diagnosis of SARS-CoV-2 by FDA under an Emergency Use Authorization (EUA). This EUA will remain in effect (meaning this test can be used) for the duration of the COVID-19 declaration under Section 564(b)(1) of the Act, 21 U.S.C. section 360bbb-3(b)(1), unless the authorization is terminated or revoked.     Resp Syncytial Virus by PCR NEGATIVE NEGATIVE Final    Comment: (NOTE) Fact Sheet for Patients: BloggerCourse.com  Fact Sheet for Healthcare Providers: SeriousBroker.it  This test is not yet approved or cleared by the [place]  FDA and has been authorized for detection and/or diagnosis of SARS-CoV-2 by FDA under an Emergency Use Authorization (EUA). This EUA will remain in effect (meaning this test can be used) for the duration of the COVID-19 declaration under Section 564(b)(1) of the Act, 21 U.S.C. section 360bbb-3(b)(1), unless the authorization is terminated or revoked.  Performed at [facility], [address] [place], [place] [address]   MRSA Next Gen by PCR, Nasal     Status: None   Collection Time: 04/16/24 12:52 PM   Specimen: Nasal Mucosa; Nasal Swab  Result Value Ref Range Status   MRSA by PCR Next Gen NOT DETECTED NOT DETECTED Final    Comment: (NOTE) The GeneXpert MRSA Assay (FDA approved for NASAL specimens only), is one component of a comprehensive MRSA colonization surveillance program. It is not intended to diagnose MRSA infection nor to guide or monitor treatment for MRSA infections. Test performance is not FDA approved in patients less than 104 years old. Performed at [facility],  [address] [place], [place] [address]          Radiology Studies: No results found.       Scheduled Meds:  acetaminophen   650 mg Oral TID   vitamin C   250 mg Oral BID   aspirin  EC  81 mg Oral Daily   enoxaparin  (LOVENOX ) injection  40 mg Subcutaneous Q24H   feeding supplement  237 mL Oral BID BM   ferrous sulfate   325 mg Oral Q breakfast   furosemide   20 mg Oral  Daily   lamoTRIgine   25 mg Oral BID   leptospermum manuka honey  1 Application Topical Daily   magnesium  oxide  400 mg Oral Daily   melatonin  5 mg Oral QHS   multivitamin with minerals  1 tablet Oral Daily   OLANZapine   5 mg Oral QHS   pantoprazole   40 mg Oral Daily   polyethylene glycol  34 g Oral Daily   senna-docusate  2 tablet Oral BID   sertraline   100 mg Oral Daily   traZODone   100 mg Oral QHS   Continuous Infusions:  cefTRIAXone  (ROCEPHIN )  IV 2 g (04/21/24 0825)     LOS: 6 days      Calvin KATHEE Robson, MD [facility]   If 7PM-7AM, please contact night-coverage  04/22/2024, 12:02 PM

## 2024-04-22 NOTE — Plan of Care (Signed)

## 2024-04-22 NOTE — Progress Notes (Signed)
 Palliative Care Progress Note, Assessment & Plan   Patient Name: [name]       Date: 04/22/2024 DOB: 1941-04-18  Age: 83 y.o. MRN#: [id number] Attending Physician: Jhonny Calvin NOVAK, MD Primary Care Physician: Isaiah Leisure, MD Admit Date: 04/16/2024  Subjective: Complains of 6/10 left leg pain, has not received pain meds today. Did not sleep well last night. Ate 100% breakfast tray. Denies CP/SOB. Requesting purewick.   HPI: Per previous HPI: 83 y.o. female  with past medical history of dementia, anxiety, bipolar disorder, GERD, and HLD admitted from [facility] memory care on 04/16/2024 with fever and altered mental status with new oxygen requirements.   Patient is being treated for sepsis due to cellulitis of left lower extremity, pneumonia, and acute metabolic encephalopathy.   PMT was consulted to support patient with goals of care discussions.   Summary of counseling/coordination of care: Extensive chart review completed prior to meeting patient including labs, vital signs, imaging, progress notes, orders, and available advanced directive documents from current and previous encounters.   After reviewing the patient's chart and assessing the patient at bedside, I spoke with patient in regards to symptom management and goals of care.   Elderly female sitting upright in bed. She is alert and oriented to self, time, location and situation. She is more interactive today. She engages in meaningful conversation and able to convey wishes for care. Patient reports that she called cafeteria today and asked why her breakfast tray was late. She is able to state that she has lived at [facility] for [facility] years.   We discussed her improvement from a few days ago. She endorses feeling great today except for  the pain in her leg. Patient states that when she is having a bipolar episode, she is less responsive and just shuts down. Discussed that her  previous limited interaction with staff could be related to infection to which she agrees.   Therapeutic silence and active listening provided for patient to share her thoughts and emotions regarding current medical situation.  Emotional support provided.  PMT will continue to follow for changes in status.   Physical Exam Vitals reviewed.  Constitutional:      General: She is not in acute distress.    Appearance: She is not ill-appearing.  HENT:     Head: Normocephalic and atraumatic.     Mouth/Throat:     Mouth: Mucous membranes are moist.  Pulmonary:     Effort: Pulmonary effort is normal. No respiratory distress.  Musculoskeletal:     Right lower leg: No edema.     Left lower leg: No edema.     Comments: Clean, dry dressing to LLE and L foot  Neurological:     Mental Status: She is alert and oriented to person, place, and time.  Psychiatric:        Mood and Affect: Mood normal.        Behavior: Behavior normal.        Thought Content: Thought content normal.        Judgment: Judgment normal.   Recommendations/Plan: Continue DNR/DNI status as previously documented    Continue current supportive interventions PMT will continue to follow  for further GOC discussion with DSS LG          Total Time 35 minutes   Discussed with primary RN administration of pain medications and management.   Time spent includes: Detailed review of medical records (labs, imaging, vital signs), medically appropriate exam (mental status, respiratory, cardiac, skin), discussed with treatment team, counseling and educating patient, family and staff, documenting clinical information, medication management and coordination of care.     Devere Sacks, ELNITA- [facility] Palliative Medicine Team  04/22/2024 9:59 AM  Office [phone]  Pager [phone]

## 2024-04-23 ENCOUNTER — Telehealth (INDEPENDENT_AMBULATORY_CARE_PROVIDER_SITE_OTHER): Payer: Self-pay

## 2024-04-23 DIAGNOSIS — A419 Sepsis, unspecified organism: Secondary | ICD-10-CM | POA: Diagnosis not present

## 2024-04-23 LAB — CREATININE, SERUM
Creatinine, Ser: 0.6 mg/dL (ref 0.44–1.00)
GFR, Estimated: >60 mL/min (ref >60)

## 2024-04-23 MED ORDER — ASCORBIC ACID 250 MG PO TABS: 250.0000 mg | ORAL_TABLET | Freq: Two times a day (BID) | ORAL | Status: DC

## 2024-04-23 NOTE — NC FL2 (Signed)
 [place]  MEDICAID FL2 LEVEL OF CARE FORM     IDENTIFICATION  Patient Name: [name] Birthdate: 1940/10/24 Sex: female Admission Date (Current Location): 04/16/2024  [facility] and [place] Number:      Facility and Address:         Provider Number: [id number]  Attending Physician Name and Address:  Jhonny Calvin NOVAK, MD  Relative Name and Phone Number:       Current Level of Care:   Recommended Level of Care: Assisted Living Facility, Memory Care Prior Approval Number:    Date Approved/Denied:   PASRR Number:    Discharge Plan: Other (Comment) (ALF memory Care)    Current Diagnoses: Patient Active Problem List   Diagnosis Date Noted   Protein-calorie malnutrition, severe 04/18/2024   Sepsis (HCC) 04/16/2024   Atherosclerosis of native arteries of the extremities with ulceration (HCC) 03/11/2024   Atherosclerosis of 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 (HCC) 05/24/2023   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) 04/29/2023   Cellulitis 04/28/2023   HCAP (healthcare-associated pneumonia) 12/19/2020   PAD (peripheral artery disease) (HCC) 12/19/2020   HLD (hyperlipidemia) 12/19/2020   Insomnia 12/19/2020   GERD (gastroesophageal reflux disease) 12/19/2020   Bipolar disorder (HCC) 12/15/2020   Dementia with behavioral disturbance (HCC) 12/15/2020   SOB (shortness of breath) 12/12/2020   Acute respiratory failure with hypoxia (HCC) 12/12/2020   Community acquired pneumonia 12/12/2020   Pressure injury of skin 09/25/2019   Fall    Closed left subtrochanteric femur fracture (HCC) 09/21/2019    Orientation RESPIRATION BLADDER Height & Weight     Self, Time, Situation, Place  Normal Incontinent Weight: 50.4 kg Height:  5' 7 (170.2 cm)  BEHAVIORAL SYMPTOMS/MOOD NEUROLOGICAL BOWEL NUTRITION STATUS      Continent Diet (low sodium heart healthy)  AMBULATORY STATUS COMMUNICATION OF NEEDS Skin    (Reported non ambulatory at baseline)  Verbally Skin abrasions, Bruising, Other (Comment) (blisters, see dc summary for wound care)                       Personal Care Assistance Level of Assistance              Functional Limitations Info             SPECIAL CARE FACTORS FREQUENCY  PT (By licensed PT), OT (By licensed OT)     PT Frequency: Home health OT Frequency: Home Health            Contractures Contractures Info: Not present    Additional Factors Info  Code Status Code Status Info: DNR             Medication List       STOP taking these medications     atorvastatin  10 MG tablet Commonly known as: LIPITOR    clonazePAM  0.5 MG tablet Commonly known as: KLONOPIN     clopidogrel  75 MG tablet Commonly known as: PLAVIX     Multivitamin Adult (Minerals) Tabs    ondansetron  4 MG tablet Commonly known as: ZOFRAN     oxyCODONE  5 MG immediate release tablet Commonly known as: Oxy IR/ROXICODONE            TAKE these medications     acetaminophen  325 MG tablet Commonly known as: TYLENOL  Take 650 mg by mouth 3 (three) times daily.    ascorbic acid  250 MG tablet Commonly known as: VITAMIN C  Take 1 tablet (250 mg total) by mouth 2 (two) times daily.  aspirin  EC 81 MG tablet Take 1 tablet (81 mg total) by mouth daily. Swallow whole.    cholecalciferol  25 MCG (1000 UNIT) tablet Commonly known as: VITAMIN D3 Take 1,000 Units by mouth daily.    feeding supplement Liqd Take 237 mLs by mouth 2 (two) times daily between meals.    ferrous sulfate  325 (65 FE) MG tablet Take 1 tablet (325 mg total) by mouth daily with breakfast.    furosemide  20 MG tablet Commonly known as: LASIX  Take 20 mg by mouth daily.    lamoTRIgine  25 MG tablet Commonly known as: LAMICTAL  Take 25 mg by mouth 2 (two) times daily.    magnesium  oxide 400 MG tablet Commonly known as: MAG-OX Take 400 mg by mouth daily.    melatonin 5 MG Tabs Take 5 mg by mouth at bedtime.    OLANZapine  5 MG  tablet Commonly known as: ZYPREXA  Take 5 mg by mouth at bedtime.    omeprazole 20 MG capsule Commonly known as: PRILOSEC Take 20 mg by mouth daily.    polyethylene glycol 17 g packet Commonly known as: MIRALAX  / GLYCOLAX  Take 34 g by mouth daily.    senna-docusate 8.6-50 MG tablet Commonly known as: Senokot-S Take 2 tablets by mouth 2 (two) times daily.    sertraline  100 MG tablet Commonly known as: ZOLOFT  Take 100 mg by mouth daily.    traZODone  100 MG tablet Commonly known as: DESYREL  Take 100 mg by mouth at bedtime.   Relevant Imaging Results:  Relevant Lab Results:   Additional Information SS#: [id number]. Has a legal guardian through [facility] DSS: Silvestre Oliphant [phone]. Lives at [facility].  Corean ONEIDA Haddock, RN

## 2024-04-23 NOTE — Progress Notes (Signed)
 Occupational Therapy Treatment Patient Details Name: [name] MRN: [id number] DOB: 01-03-1941 Today's Date: 04/23/2024   History of present illness [name] is a 83 y.o. female with medical history significant of dementia, anxiety disorder, bipolar, GERD, hyperlipidemia, who lives in a facility brought in with fever as well as altered mental status and new oxygen requirement.  Also found to have worsening of right lower extremity wounds with surrounding cellulitis.   OT comments  Upon entering the room, pt supine in bed and agreeable to OT intervention. Pt is very fearful of falling. Pt is alert and oriented to self and situation. Pt able to tell therapist what facility she has been living at. Pt performing bed mobility with min A to EOB. Pt then pulls LEs back into bed and sits in long sitting secondary to her being so fearful. Attempts made to stand or transfer but pt declined. She is able to wash face, hands, and comb hair in long sitting. Pt returns back to bed in same manner. Call bell and all needed items within reach upon exiting the room.       If plan is discharge home, recommend the following:  A lot of help with walking and/or transfers;A lot of help with bathing/dressing/bathroom;Supervision due to cognitive status;Assist for transportation   Equipment Recommendations  None recommended by OT       Precautions / Restrictions Precautions Precautions: Fall Recall of Precautions/Restrictions: Intact       Mobility Bed Mobility Overal bed mobility: Needs Assistance Bed Mobility: Supine to Sit, Sit to Supine     Supine to sit: Min assist Sit to supine: Min assist        Transfers                   General transfer comment: pt limited by fear     Balance Overall balance assessment: Needs assistance Sitting-balance support: Feet supported, Bilateral upper extremity supported Sitting balance-Leahy Scale: Fair                                      ADL either performed or assessed with clinical judgement   ADL Overall ADL's : Needs assistance/impaired     Grooming: Wash/dry hands;Wash/dry face;Set up;Supervision/safety;Brushing hair;Sitting                                      Extremity/Trunk Assessment Upper Extremity Assessment Upper Extremity Assessment: Generalized weakness   Lower Extremity Assessment Lower Extremity Assessment: Generalized weakness        Vision Patient Visual Report: No change from baseline           Communication Communication Communication: No apparent difficulties   Cognition Arousal: Alert Behavior During Therapy: WFL for tasks assessed/performed, Impulsive Cognition: History of cognitive impairments                               Following commands: Impaired Following commands impaired: Follows one step commands inconsistently, Follows one step commands with increased time      Cueing   Cueing Techniques: Tactile cues, Gestural cues, Verbal cues             Pertinent Vitals/ Pain       Pain Assessment Pain Assessment: Faces Faces Pain Scale:  Hurts little more Pain Location: L ankle Pain Descriptors / Indicators: Aching, Discomfort Pain Intervention(s): Limited activity within patient's tolerance, Repositioned, Monitored during session         Frequency  Min 2X/week        Progress Toward Goals  OT Goals(current goals can now be found in the care plan section)  Progress towards OT goals: Progressing toward goals      AM-PAC OT 6 Clicks Daily Activity     Outcome Measure   Help from another person eating meals?: A Little Help from another person taking care of personal grooming?: A Little Help from another person toileting, which includes using toliet, bedpan, or urinal?: A Lot Help from another person bathing (including washing, rinsing, drying)?: A Lot Help from another person to put on and taking off regular upper  body clothing?: A Lot Help from another person to put on and taking off regular lower body clothing?: A Lot 6 Click Score: 14    End of Session    OT Visit Diagnosis: Other abnormalities of gait and mobility (R26.89);Muscle weakness (generalized) (M62.81)   Activity Tolerance Patient limited by pain   Patient Left in bed;with call bell/phone within reach;with bed alarm set   Nurse Communication          Time: [phone] OT Time Calculation (min): 14 min  Charges: OT General Charges $OT Visit: 1 Visit OT Treatments $Self Care/Home Management : 8-22 mins  Izetta Claude, MS, OTR/L , CBIS ascom [phone]  04/23/24, 4:41 PM

## 2024-04-23 NOTE — Progress Notes (Signed)
 PROGRESS NOTE    [name]  FMW:[id number] DOB: 02-02-1941 DOA: 04/16/2024 PCP: Isaiah Leisure, MD    Brief Narrative:   From HPI [name] is a 83 y.o. female with medical history significant of dementia, anxiety disorder, bipolar, GERD, hyperlipidemia, who lives in a facility brought in with fever as well as altered mental status and new oxygen requirement.  Also found to have worsening of right lower extremity wounds with surrounding cellulitis.  At the time patient was being seen mental status had improved some, denies chest pain, nausea vomiting chest pain or abdominal pain.  ED course: Upon arrival temperature 100.8, respiratory rate 20, pulse 79, blood pressure 131/66. Given concerns of sepsis patient was initiated on antibiotic therapy in the emergency room and hospitalist service was contacted to admit patient for further management   Assessment & Plan:   Principal Problem:   Sepsis (HCC) Active Problems:   Protein-calorie malnutrition, severe  Sepsis secondary to left lower extremity cellulitis as well as possible underlying pneumonia Patient met sepsis criteria on presentation with temperature 100.8, WBC 13.7 in the setting of cellulitis as well as pneumonia MRSA screen negative Plan: Much more awake and alert today.  Has completed a 7-day course of ceftriaxone .  No further antibiotics indicated at this time.  Continue nursing for wound care.  Baseline   Acute hypoxic respiratory failure secondary to pneumonia Patient currently requiring 2 L of oxygen She does not use any oxygen at home Weaned to room air.  Antibiotics as above   Acute metabolic encephalopathy secondary to above Status unclear but has improved substantially.  Continue to monitor   Dementia Continue to monitor closely   Anxiety disorder, bipolar,  Continue trazodone , sertraline  and olanzapine    GERD Continue PPI     Hyperlipidemia Continue statin therapy  Functional decline Patient  with poor baseline.  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  Poor family support.  Has a DSS guardian.  Quality of life has diminished.  I feel she is appropriate for further de-escalation of care and consideration for engagement of hospice services.  Palliative care consult requested.  Multiple attempts to reach DSS guardian have been unsuccessful.  DSS guardian finally reached on 8/11.  Facility will need to send a representative to evaluate patient for safe return on 8/12.  As such discharge delayed for reasons outside of our control.    DVT prophylaxis: Lovenox  Code Status: DNR Family Communication: None Disposition Plan: Status is: Inpatient Remains inpatient appropriate because: Pneumonia on IV antibiotics   Level of care: Telemetry Medical  Consultants:  None  Procedures:  None  Antimicrobials:   Subjective: Seen and examined.  Much more awake and alert this morning.  Sitting up on edge of bed.  No distress.  Objective: Vitals:   04/22/24 2019 04/23/24 0439 04/23/24 0449 04/23/24 0931  BP: 93/68  117/61 (!) 125/56  Pulse: 92  87 98  Resp: 17  16 16   Temp: 98.3 F (36.8 C)  98.4 F (36.9 C) 98.5 F (36.9 C)  TempSrc:      SpO2: 100%  94% 96%  Weight:  50.4 kg    Height:        Intake/Output Summary (Last 24 hours) at 04/23/2024 1303 Last data filed at 04/23/2024 1041 Gross per 24 hour  Intake 480 ml  Output 250 ml  Net 230 ml   Filed Weights   04/21/24 0745 04/22/24 0451 04/23/24 0439  Weight: 50.2 kg 48.1 kg 50.4 kg  Examination:  General exam: Appears frail otherwise stable Respiratory system: Clear lungs.  Normal work of breathing.  Room air Cardiovascular system: S1-S2, RRR, 2/6 heart murmur, normal bowel sounds Gastrointestinal system: Thin, soft, T/ND, normal bowel sounds Central nervous system: Alert and oriented. No focal neurological deficits. Extremities: Decreased power bilateral lower extremities Skin: No rashes, lesions or  ulcers Psychiatry: Judgement and insight appear impaired. Mood & affect flattened.     Data Reviewed: I have personally reviewed following labs and imaging studies  CBC: Recent Labs  Lab 04/17/24 0413 04/18/24 0341  WBC 9.7 10.8*  NEUTROABS  --  7.1  HGB 9.2* 8.6*  HCT 30.4* 28.0*  MCV 83.1 81.6  PLT 487* 500*   Basic Metabolic Panel: Recent Labs  Lab 04/17/24 0413 04/18/24 0341 04/23/24 0450  NA 139 137  --   K 3.4* 4.4  --   CL 106 108  --   CO2 26 25  --   GLUCOSE 91 117*  --   BUN 15 17  --   CREATININE 0.59 0.54 0.60  CALCIUM  8.9 8.9  --   MG  --  1.2*  --   PHOS  --  2.7  --    GFR: Estimated Creatinine Clearance: 41.7 mL/min (by C-G formula based on SCr of 0.6 mg/dL). Liver Function Tests: No results for input(s): AST, ALT, ALKPHOS, BILITOT, PROT, ALBUMIN in the last 168 hours.  No results for input(s): LIPASE, AMYLASE in the last 168 hours. No results for input(s): AMMONIA in the last 168 hours. Coagulation Profile: No results for input(s): INR, PROTIME in the last 168 hours.  Cardiac Enzymes: No results for input(s): CKTOTAL, CKMB, CKMBINDEX, TROPONINI in the last 168 hours. BNP (last 3 results) No results for input(s): PROBNP in the last 8760 hours. HbA1C: No results for input(s): HGBA1C in the last 72 hours. CBG: Recent Labs  Lab 04/21/24 0337 04/21/24 2032  GLUCAP 100* 227*   Lipid Profile: No results for input(s): CHOL, HDL, LDLCALC, TRIG, CHOLHDL, LDLDIRECT in the last 72 hours. Thyroid Function Tests: No results for input(s): TSH, T4TOTAL, FREET4, T3FREE, THYROIDAB in the last 72 hours. Anemia Panel: No results for input(s): VITAMINB12, FOLATE, FERRITIN, TIBC, IRON, RETICCTPCT in the last 72 hours. Sepsis Labs: Recent Labs  Lab 04/18/24 0341  PROCALCITON <0.10    Recent Results (from the past 240 hours)  Culture, blood (Routine x 2)     Status: None   Collection  Time: 04/16/24  6:35 AM   Specimen: BLOOD LEFT ARM  Result Value Ref Range Status   Specimen Description BLOOD LEFT ARM  Final   Special Requests   Final    BOTTLES DRAWN AEROBIC AND ANAEROBIC Blood Culture adequate volume   Culture   Final    NO GROWTH 5 DAYS Performed at [facility], [address], [place], [place] [address]    Report Status 04/21/2024 FINAL  Final  Culture, blood (Routine x 2)     Status: Abnormal   Collection Time: 04/16/24  6:43 AM   Specimen: BLOOD RIGHT HAND  Result Value Ref Range Status   Specimen Description   Final    BLOOD RIGHT HAND Performed at [facility], [address] [place], [place] [address]    Special Requests   Final    BOTTLES DRAWN AEROBIC AND ANAEROBIC Blood Culture results may not be optimal due to an inadequate volume of blood received in culture bottles Performed at [facility], [address] [place], [place] [address]    Culture  Setup Time   Final    GRAM POSITIVE COCCI IN CLUSTERS IN BOTH AEROBIC AND ANAEROBIC BOTTLES CRITICAL RESULT CALLED TO, READ BACK BY AND VERIFIED WITH: PHARMD NATHAN B [phone] [id number] FCP    Culture (A)  Final    STAPHYLOCOCCUS WARNERI THE SIGNIFICANCE OF ISOLATING THIS ORGANISM FROM A SINGLE SET OF BLOOD CULTURES WHEN MULTIPLE SETS ARE DRAWN IS UNCERTAIN. PLEASE NOTIFY THE MICROBIOLOGY DEPARTMENT WITHIN ONE WEEK IF SPECIATION AND SENSITIVITIES ARE REQUIRED. Performed at [facility], [address], [place], [place] [address]    Report Status 04/19/2024 FINAL  Final  Blood Culture ID Panel (Reflexed)     Status: Abnormal   Collection Time: 04/16/24  6:43 AM  Result Value Ref Range Status   Enterococcus faecalis NOT DETECTED NOT DETECTED Final   Enterococcus Faecium NOT DETECTED NOT DETECTED Final   Listeria monocytogenes NOT DETECTED NOT DETECTED Final   Staphylococcus species DETECTED (A) NOT DETECTED Final    Comment: CRITICAL RESULT CALLED TO, READ BACK BY AND  VERIFIED WITH: PHARMD NATHAN B [phone] [id number] FCP    Staphylococcus aureus (BCID) NOT DETECTED NOT DETECTED Final   Staphylococcus epidermidis NOT DETECTED NOT DETECTED Final   Staphylococcus lugdunensis NOT DETECTED NOT DETECTED Final   Streptococcus species NOT DETECTED NOT DETECTED Final   Streptococcus agalactiae NOT DETECTED NOT DETECTED Final   Streptococcus pneumoniae NOT DETECTED NOT DETECTED Final   Streptococcus pyogenes NOT DETECTED NOT DETECTED Final   A.calcoaceticus-baumannii NOT DETECTED NOT DETECTED Final   Bacteroides fragilis NOT DETECTED NOT DETECTED Final   Enterobacterales NOT DETECTED NOT DETECTED Final   Enterobacter cloacae complex NOT DETECTED NOT DETECTED Final   Escherichia coli NOT DETECTED NOT DETECTED Final   Klebsiella aerogenes NOT DETECTED NOT DETECTED Final   Klebsiella oxytoca NOT DETECTED NOT DETECTED Final   Klebsiella pneumoniae NOT DETECTED NOT DETECTED Final   Proteus species NOT DETECTED NOT DETECTED Final   Salmonella species NOT DETECTED NOT DETECTED Final   Serratia marcescens NOT DETECTED NOT DETECTED Final   Haemophilus influenzae NOT DETECTED NOT DETECTED Final   Neisseria meningitidis NOT DETECTED NOT DETECTED Final   Pseudomonas aeruginosa NOT DETECTED NOT DETECTED Final   Stenotrophomonas maltophilia NOT DETECTED NOT DETECTED Final   Candida albicans NOT DETECTED NOT DETECTED Final   Candida auris NOT DETECTED NOT DETECTED Final   Candida glabrata NOT DETECTED NOT DETECTED Final   Candida krusei NOT DETECTED NOT DETECTED Final   Candida parapsilosis NOT DETECTED NOT DETECTED Final   Candida tropicalis NOT DETECTED NOT DETECTED Final   Cryptococcus neoformans/gattii NOT DETECTED NOT DETECTED Final    Comment: Performed at [facility], [address] [place], [place] [address]  Resp panel by RT-PCR (RSV, Flu A&B, Covid) Anterior Nasal Swab     Status: None   Collection Time: 04/16/24  7:19 AM   Specimen: Anterior Nasal Swab   Result Value Ref Range Status   SARS Coronavirus 2 by RT PCR NEGATIVE NEGATIVE Final    Comment: (NOTE) SARS-CoV-2 target nucleic acids are NOT DETECTED.  The SARS-CoV-2 RNA is generally detectable in upper respiratory specimens during the acute phase of infection. The lowest concentration of SARS-CoV-2 viral copies this assay can detect is 138 copies/mL. A negative result does not preclude SARS-Cov-2 infection and should not be used as the sole basis for treatment or other patient management decisions. A negative result may occur with  improper  specimen collection/handling, submission of specimen other than nasopharyngeal swab, presence of viral mutation(s) within the areas targeted by this assay, and inadequate number of viral copies(<138 copies/mL). A negative result must be combined with clinical observations, patient history, and epidemiological information. The expected result is Negative.  Fact Sheet for Patients:  BloggerCourse.com  Fact Sheet for Healthcare Providers:  SeriousBroker.it  This test is no t yet approved or cleared by the [place]  FDA and  has been authorized for detection and/or diagnosis of SARS-CoV-2 by FDA under an Emergency Use Authorization (EUA). This EUA will remain  in effect (meaning this test can be used) for the duration of the COVID-19 declaration under Section 564(b)(1) of the Act, 21 U.S.C.section 360bbb-3(b)(1), unless the authorization is terminated  or revoked sooner.       Influenza A by PCR NEGATIVE NEGATIVE Final   Influenza B by PCR NEGATIVE NEGATIVE Final    Comment: (NOTE) The Xpert Xpress SARS-CoV-2/FLU/RSV plus assay is intended as an aid in the diagnosis of influenza from Nasopharyngeal swab specimens and should not be used as a sole basis for treatment. Nasal washings and aspirates are unacceptable for Xpert Xpress SARS-CoV-2/FLU/RSV testing.  Fact Sheet for  Patients: BloggerCourse.com  Fact Sheet for Healthcare Providers: SeriousBroker.it  This test is not yet approved or cleared by the [place]  FDA and has been authorized for detection and/or diagnosis of SARS-CoV-2 by FDA under an Emergency Use Authorization (EUA). This EUA will remain in effect (meaning this test can be used) for the duration of the COVID-19 declaration under Section 564(b)(1) of the Act, 21 U.S.C. section 360bbb-3(b)(1), unless the authorization is terminated or revoked.     Resp Syncytial Virus by PCR NEGATIVE NEGATIVE Final    Comment: (NOTE) Fact Sheet for Patients: BloggerCourse.com  Fact Sheet for Healthcare Providers: SeriousBroker.it  This test is not yet approved or cleared by the [place]  FDA and has been authorized for detection and/or diagnosis of SARS-CoV-2 by FDA under an Emergency Use Authorization (EUA). This EUA will remain in effect (meaning this test can be used) for the duration of the COVID-19 declaration under Section 564(b)(1) of the Act, 21 U.S.C. section 360bbb-3(b)(1), unless the authorization is terminated or revoked.  Performed at [facility], [address] [place], [place] [address]   MRSA Next Gen by PCR, Nasal     Status: None   Collection Time: 04/16/24 12:52 PM   Specimen: Nasal Mucosa; Nasal Swab  Result Value Ref Range Status   MRSA by PCR Next Gen NOT DETECTED NOT DETECTED Final    Comment: (NOTE) The GeneXpert MRSA Assay (FDA approved for NASAL specimens only), is one component of a comprehensive MRSA colonization surveillance program. It is not intended to diagnose MRSA infection nor to guide or monitor treatment for MRSA infections. Test performance is not FDA approved in patients less than 34 years old. Performed at [facility], [address] [place], [place] [address]           Radiology Studies: No results found.       Scheduled Meds:  acetaminophen   650 mg Oral TID   vitamin C   250 mg Oral BID   aspirin  EC  81 mg Oral Daily   enoxaparin  (LOVENOX ) injection  40 mg Subcutaneous Q24H   feeding supplement  237 mL Oral BID BM   ferrous sulfate   325 mg Oral Q breakfast   furosemide   20 mg Oral Daily   lamoTRIgine   25 mg Oral  BID   leptospermum manuka honey  1 Application Topical Daily   magnesium  oxide  400 mg Oral Daily   melatonin  5 mg Oral QHS   multivitamin with minerals  1 tablet Oral Daily   OLANZapine   5 mg Oral QHS   pantoprazole   40 mg Oral Daily   polyethylene glycol  34 g Oral Daily   senna-docusate  2 tablet Oral BID   sertraline   100 mg Oral Daily   traZODone   100 mg Oral QHS   Continuous Infusions:     LOS: 7 days      Calvin KATHEE Robson, MD [facility]   If 7PM-7AM, please contact night-coverage  04/23/2024, 1:03 PM

## 2024-04-23 NOTE — Progress Notes (Signed)
 [facility] Liaison Note  Referral received from [facility], Corean Haddock, RN, for palliative care services after patient discharges from the hospital.    Referral submitted today.  Please call with any palliative care questions or concerns.  Thank you for the opportunity to participate in this patient's care.  [facility] Liaison [phone]

## 2024-04-23 NOTE — Progress Notes (Signed)
 Physical Therapy Treatment Patient Details Name: [name] MRN: [id number] DOB: 03/06/1941 Today's Date: 04/23/2024   History of Present Illness [name] is a 83 y.o. female with medical history significant of dementia, anxiety disorder, bipolar, GERD, hyperlipidemia, who lives in a facility brought in with fever as well as altered mental status and new oxygen requirement.  Also found to have worsening of right lower extremity wounds with surrounding cellulitis.    PT Comments  Pt limited today due to L foot pain and anxiety. Oriented to self, a bit impulsive. Actively attempting to return to supine for majority of attempt, maxA to come to sitting EOB. Pt declined any OOB activities due to fear, but able to sit for a few minutes, and assisted with brushing her own hair (CGA with fair balance.). Returned to supine with needs in reach. Care team updated on pt status and limitations. The patient would benefit from further skilled PT intervention to continue to progress towards goals.    If plan is discharge home, recommend the following: A lot of help with walking and/or transfers;Assistance with cooking/housework;Assist for transportation;Help with stairs or ramp for entrance   Can travel by private vehicle        Equipment Recommendations  Other (comment) (TBD)    Recommendations for Other Services       Precautions / Restrictions Precautions Precautions: Fall Recall of Precautions/Restrictions: Intact Restrictions Weight Bearing Restrictions Per Provider Order: No     Mobility  Bed Mobility Overal bed mobility: Needs Assistance Bed Mobility: Supine to Sit, Sit to Supine     Supine to sit: Max assist Sit to supine: Max assist   General bed mobility comments: pt limited by fear    Transfers                   General transfer comment: pt limited by fear, L foot pain    Ambulation/Gait               General Gait Details: doesn't amb at  baseline   Stairs             Wheelchair Mobility     Tilt Bed    Modified Rankin (Stroke Patients Only)       Balance Overall balance assessment: Needs assistance Sitting-balance support: Feet supported, Bilateral upper extremity supported Sitting balance-Leahy Scale: Fair     Standing balance support: Bilateral upper extremity supported Standing balance-Leahy Scale: Poor                              Communication    Cognition Arousal: Alert Behavior During Therapy: WFL for tasks assessed/performed, Impulsive   PT - Cognitive impairments: History of cognitive impairments                       PT - Cognition Comments: alert to self Following commands: Impaired Following commands impaired: Follows one step commands inconsistently    Cueing Cueing Techniques: Tactile cues, Gestural cues, Verbal cues  Exercises      General Comments        Pertinent Vitals/Pain Pain Assessment Pain Assessment: No/denies pain Faces Pain Scale: Hurts little more Pain Location: L ankle Pain Descriptors / Indicators: Aching Pain Intervention(s): Limited activity within patient's tolerance, Repositioned, Monitored during session    Home Living  Prior Function            PT Goals (current goals can now be found in the care plan section) Progress towards PT goals: Progressing toward goals    Frequency    Min 2X/week      PT Plan      Co-evaluation              AM-PAC PT 6 Clicks Mobility   Outcome Measure  Help needed turning from your back to your side while in a flat bed without using bedrails?: A Lot Help needed moving from lying on your back to sitting on the side of a flat bed without using bedrails?: A Lot Help needed moving to and from a bed to a chair (including a wheelchair)?: A Lot Help needed standing up from a chair using your arms (e.g., wheelchair or bedside chair)?: A Lot Help  needed to walk in hospital room?: Total Help needed climbing 3-5 steps with a railing? : Total 6 Click Score: 10    End of Session Equipment Utilized During Treatment: Gait belt Activity Tolerance: Patient limited by lethargy Patient left: in bed;with call bell/phone within reach;with bed alarm set Nurse Communication: Mobility status PT Visit Diagnosis: Unsteadiness on feet (R26.81);Muscle weakness (generalized) (M62.81);Difficulty in walking, not elsewhere classified (R26.2)     Time: 8559-8551 PT Time Calculation (min) (ACUTE ONLY): 8 min  Charges:    $Therapeutic Activity: 8-22 mins PT General Charges $$ ACUTE PT VISIT: 1 Visit                     Doyal Shams PT, DPT 3:17 PM,04/23/24

## 2024-04-23 NOTE — Plan of Care (Signed)
                                                     Palliative Care Progress Note   Patient Name: [name]       Date: 04/23/2024 DOB: 29-Oct-1940  Age: 83 y.o. MRN#: [id number] Attending Physician: Jhonny Calvin NOVAK, MD Primary Care Physician: Isaiah Leisure, MD Admit Date: 04/16/2024  Extensive chart review completed including labs, vital signs, imaging, progress notes, orders, and available advanced directive documents from current and previous encounters.   Plan was for patient to discharge today.  However, patient will need to be evaluated by facility prior to returning.  TOC following closely for discharge planning.  Goals are clear.  Plan is set.  Outpatient palliative referral in place.  No acute palliative needs today.  Thank you for allowing the Palliative Medicine Team to assist in the care of [name].  Lamarr L. Arvid, DNP, FNP-BC Palliative Medicine Team  No charge

## 2024-04-23 NOTE — Plan of Care (Signed)

## 2024-04-23 NOTE — Telephone Encounter (Signed)
 I called [facility] in regards to patients most recent lab results. I was transferred to memory care and I was sent to voicemail. I left a message for them to give me a call back when they had the chance. I will call again at another time.

## 2024-04-23 NOTE — Discharge Summary (Deleted)
 Physician Discharge Summary  [name] FMW:[id number] DOB: [birth year] DOA: 04/16/2024  PCP: Isaiah Leisure, MD  Admit date: 04/16/2024 Discharge date: 04/23/2024  Admitted From: Memory care Disposition:  Memory care  Recommendations for Outpatient Follow-up:  Follow up with PCP in 1-2 weeks Ambulatory referral to palliative care  Home Health:No  Equipment/Devices:None   Discharge Condition:Stable  CODE STATUS:DNR  Diet recommendation: Reg  Brief/Interim Summary:   From HPI [name] is a 83 y.o. female with medical history significant of dementia, anxiety disorder, bipolar, GERD, hyperlipidemia, who lives in a facility brought in with fever as well as altered mental status and new oxygen requirement.  Also found to have worsening of right lower extremity wounds with surrounding cellulitis.  At the time patient was being seen mental status had improved some, denies chest pain, nausea vomiting chest pain or abdominal pain.   ED course: Upon arrival temperature 100.8, respiratory rate 20, pulse 79, blood pressure 131/66. Given concerns of sepsis patient was initiated on antibiotic therapy in the emergency room and hospitalist service was contacted to admit patient for further management       Discharge Diagnoses:  Principal Problem:   Sepsis (HCC) Active Problems:   Protein-calorie malnutrition, severe  Sepsis secondary to left lower extremity cellulitis as well as possible underlying pneumonia Patient met sepsis criteria on presentation with temperature 100.8, WBC 13.7 in the setting of cellulitis as well as pneumonia MRSA screen negative Plan: Completed a 7-day course of Rocephin .  No antibiotics indicated at this time.  Appreciate WOC recommendations.  Continue nursing for wound care. Return to [facility] memory care   Acute hypoxic respiratory failure secondary to pneumonia Patient currently requiring 2 L of oxygen She does not use any oxygen at home Weaned to room  air.  Completed antibiotics as above   Acute metabolic encephalopathy secondary to above Appears resolved   Dementia Continue to monitor closely   Anxiety disorder, bipolar,  Continue trazodone , sertraline  and olanzapine    GERD Continue PPI     Hyperlipidemia Continue statin therapy   Functional decline Patient with poor baseline.  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  Poor family support.  Has a DSS guardian.  Quality of life has diminished.  I feel she is appropriate for further de-escalation of care and consideration for engagement of hospice services.  Palliative care consult requested.  Multiple attempts to reach DSS guardian have been unsuccessful.  Return to [facility] memory care.  Outpatient referral to palliative care placed at time of dc.   Discharge Instructions  Discharge Instructions     Amb Referral to Palliative Care   Complete by: As directed    Diet - low sodium heart healthy   Complete by: As directed    Discharge wound care:   Complete by: As directed    Wound care  Daily      Comments: R leg: Cleanse with Vashe #[id number], pat dry the peri-wound skin, not rinse after use. Apply Aquacel [id number] into the wound bed, cover with gauze or ABD pad. Apply on the peri-wound skin the barrier cream available in the clean storage. Wrap with Kerlix, add ace wrap if is necessary. Change daily or PRN. Wrap from the base of the toes to the knee, do not apply compression to the bandage.   R foot: Cleanse with Vashe #[id number], pat dry the peri-wound skin, not rinse after use. Apply Medihoney on the wound bed, cover with gauze or foam dressing, wrap with stretch bandage  to hold the dressings. Change daily.   Increase activity slowly   Complete by: As directed       Allergies as of 04/23/2024   No Known Allergies      Medication List     STOP taking these medications    atorvastatin  10 MG tablet Commonly known as: LIPITOR   clonazePAM  0.5 MG tablet Commonly  known as: KLONOPIN    clopidogrel  75 MG tablet Commonly known as: PLAVIX    Multivitamin Adult (Minerals) Tabs   ondansetron  4 MG tablet Commonly known as: ZOFRAN    oxyCODONE  5 MG immediate release tablet Commonly known as: Oxy IR/ROXICODONE        TAKE these medications    acetaminophen  325 MG tablet Commonly known as: TYLENOL  Take 650 mg by mouth 3 (three) times daily.   ascorbic acid  250 MG tablet Commonly known as: VITAMIN C  Take 1 tablet (250 mg total) by mouth 2 (two) times daily.   aspirin  EC 81 MG tablet Take 1 tablet (81 mg total) by mouth daily. Swallow whole.   cholecalciferol  25 MCG (1000 UNIT) tablet Commonly known as: VITAMIN D3 Take 1,000 Units by mouth daily.   feeding supplement Liqd Take 237 mLs by mouth 2 (two) times daily between meals.   ferrous sulfate  325 (65 FE) MG tablet Take 1 tablet (325 mg total) by mouth daily with breakfast.   furosemide  20 MG tablet Commonly known as: LASIX  Take 20 mg by mouth daily.   lamoTRIgine  25 MG tablet Commonly known as: LAMICTAL  Take 25 mg by mouth 2 (two) times daily.   magnesium  oxide 400 MG tablet Commonly known as: MAG-OX Take 400 mg by mouth daily.   melatonin 5 MG Tabs Take 5 mg by mouth at bedtime.   OLANZapine  5 MG tablet Commonly known as: ZYPREXA  Take 5 mg by mouth at bedtime.   omeprazole 20 MG capsule Commonly known as: PRILOSEC Take 20 mg by mouth daily.   polyethylene glycol 17 g packet Commonly known as: MIRALAX  / GLYCOLAX  Take 34 g by mouth daily.   senna-docusate 8.6-50 MG tablet Commonly known as: Senokot-S Take 2 tablets by mouth 2 (two) times daily.   sertraline  100 MG tablet Commonly known as: ZOLOFT  Take 100 mg by mouth daily.   traZODone  100 MG tablet Commonly known as: DESYREL  Take 100 mg by mouth at bedtime.               Discharge Care Instructions  (From admission, onward)           Start     Ordered   04/23/24 0000  Discharge wound care:        Comments: Wound care  Daily      Comments: R leg: Cleanse with Vashe #[id number], pat dry the peri-wound skin, not rinse after use. Apply Aquacel [id number] into the wound bed, cover with gauze or ABD pad. Apply on the peri-wound skin the barrier cream available in the clean storage. Wrap with Kerlix, add ace wrap if is necessary. Change daily or PRN. Wrap from the base of the toes to the knee, do not apply compression to the bandage.   R foot: Cleanse with Vashe #[id number], pat dry the peri-wound skin, not rinse after use. Apply Medihoney on the wound bed, cover with gauze or foam dressing, wrap with stretch bandage to hold the dressings. Change daily.   04/23/24 1043            No Known Allergies  Consultations: Palliative care  Procedures/Studies: CT HEAD WO CONTRAST ( ) Result Date: 04/16/2024 EXAM: CT HEAD WITHOUT 04/16/2024 04:26:00 PM TECHNIQUE: CT of the head was performed without the administration of intravenous contrast. Automated exposure control, iterative reconstruction, and/or weight based adjustment of the mA/kV was utilized to reduce the radiation dose to as low as reasonably achievable. COMPARISON: 03/28/2021 CLINICAL HISTORY: Stroke, follow up. Sepsis. FINDINGS: BRAIN AND VENTRICLES: No acute intracranial hemorrhage. No mass effect or midline shift. No extra-axial fluid collection. Gray-white differentiation is maintained. No hydrocephalus. Chronic ischemic white matter changes. ORBITS: No acute abnormality. SINUSES AND MASTOIDS: No acute abnormality. SOFT TISSUES AND SKULL: No acute skull fracture. No acute soft tissue abnormality. Atherosclerotic calcifications of the internal carotid arteries at the skull base. IMPRESSION: 1. No acute intracranial abnormality. 2. Chronic ischemic white matter changes. 3. Atherosclerotic calcifications of the internal carotid arteries at the skull base. Electronically signed by: Franky Stanford MD 04/16/2024 07:27 PM EDT RP Workstation: [id number]    DG Chest Portable 1 View Result Date: 04/16/2024 EXAM: 1 VIEW XRAY OF THE CHEST 04/16/2024 07:20:52 AM COMPARISON: 12/19/2020. CLINICAL HISTORY: Sepsis. FINDINGS: LUNGS AND PLEURA: Significant resolution of the bibasilar opacities with some residual linear opacities. No pulmonary edema. No pleural effusion. No pneumothorax. HEART AND MEDIASTINUM: No acute abnormality of the cardiac and mediastinal silhouettes. Aortic atherosclerosis. BONES AND SOFT TISSUES: No acute osseous abnormality. IMPRESSION: 1. Significant resolution of the bibasilar opacities with some residual linear opacities. Electronically signed by: Katheleen Faes MD 04/16/2024 07:26 AM EDT RP Workstation: [id number]      Subjective: Seen and examined on day of discharge.  Mental status much improved.  Discharge Exam: Vitals:   04/23/24 0449 04/23/24 0931  BP: 117/61 (!) 125/56  Pulse: 87 98  Resp: 16 16  Temp: 98.4 F (36.9 C) 98.5 F (36.9 C)  SpO2: 94% 96%   Vitals:   04/22/24 2019 04/23/24 0439 04/23/24 0449 04/23/24 0931  BP: 93/68  117/61 (!) 125/56  Pulse: 92  87 98  Resp: 17  16 16   Temp: 98.3 F (36.8 C)  98.4 F (36.9 C) 98.5 F (36.9 C)  TempSrc:      SpO2: 100%  94% 96%  Weight:  50.4 kg    Height:        General: Pt is alert, awake, not in acute distress Cardiovascular: RRR, S1/S2 +, no rubs, no gallops Respiratory: CTA bilaterally, no wheezing, no rhonchi Abdominal: Soft, NT, ND, bowel sounds + Extremities: no edema, no cyanosis    The results of significant diagnostics from this hospitalization (including imaging, microbiology, ancillary and laboratory) are listed below for reference.     Microbiology: Recent Results (from the past 240 hours)  Culture, blood (Routine x 2)     Status: None   Collection Time: 04/16/24  6:35 AM   Specimen: BLOOD LEFT ARM  Result Value Ref Range Status   Specimen Description BLOOD LEFT ARM  Final   Special Requests   Final    BOTTLES DRAWN AEROBIC AND  ANAEROBIC Blood Culture adequate volume   Culture   Final    NO GROWTH 5 DAYS Performed at [facility], [address], [place], [place] [address]    Report Status 04/21/2024 FINAL  Final  Culture, blood (Routine x 2)     Status: Abnormal   Collection Time: 04/16/24  6:43 AM   Specimen: BLOOD RIGHT HAND  Result Value Ref Range Status   Specimen Description   Final    BLOOD RIGHT HAND Performed  at [facility], [address] [place], [place] [address]    Special Requests   Final    BOTTLES DRAWN AEROBIC AND ANAEROBIC Blood Culture results may not be optimal due to an inadequate volume of blood received in culture bottles Performed at [facility], [address] [place], [place] [address]    Culture  Setup Time   Final    GRAM POSITIVE COCCI IN CLUSTERS IN BOTH AEROBIC AND ANAEROBIC BOTTLES CRITICAL RESULT CALLED TO, READ BACK BY AND VERIFIED WITH: PHARMD NATHAN B 0104 [id number] FCP    Culture (A)  Final    STAPHYLOCOCCUS WARNERI THE SIGNIFICANCE OF ISOLATING THIS ORGANISM FROM A SINGLE SET OF BLOOD CULTURES WHEN MULTIPLE SETS ARE DRAWN IS UNCERTAIN. PLEASE NOTIFY THE MICROBIOLOGY DEPARTMENT WITHIN ONE WEEK IF SPECIATION AND SENSITIVITIES ARE REQUIRED. Performed at [facility], [address], [place], [place] [address]    Report Status 04/19/2024 FINAL  Final  Blood Culture ID Panel (Reflexed)     Status: Abnormal   Collection Time: 04/16/24  6:43 AM  Result Value Ref Range Status   Enterococcus faecalis NOT DETECTED NOT DETECTED Final   Enterococcus Faecium NOT DETECTED NOT DETECTED Final   Listeria monocytogenes NOT DETECTED NOT DETECTED Final   Staphylococcus species DETECTED (A) NOT DETECTED Final    Comment: CRITICAL RESULT CALLED TO, READ BACK BY AND VERIFIED WITH: PHARMD NATHAN B 0104 [id number] FCP    Staphylococcus aureus (BCID) NOT DETECTED NOT DETECTED Final   Staphylococcus epidermidis NOT DETECTED NOT DETECTED Final    Staphylococcus lugdunensis NOT DETECTED NOT DETECTED Final   Streptococcus species NOT DETECTED NOT DETECTED Final   Streptococcus agalactiae NOT DETECTED NOT DETECTED Final   Streptococcus pneumoniae NOT DETECTED NOT DETECTED Final   Streptococcus pyogenes NOT DETECTED NOT DETECTED Final   A.calcoaceticus-baumannii NOT DETECTED NOT DETECTED Final   Bacteroides fragilis NOT DETECTED NOT DETECTED Final   Enterobacterales NOT DETECTED NOT DETECTED Final   Enterobacter cloacae complex NOT DETECTED NOT DETECTED Final   Escherichia coli NOT DETECTED NOT DETECTED Final   Klebsiella aerogenes NOT DETECTED NOT DETECTED Final   Klebsiella oxytoca NOT DETECTED NOT DETECTED Final   Klebsiella pneumoniae NOT DETECTED NOT DETECTED Final   Proteus species NOT DETECTED NOT DETECTED Final   Salmonella species NOT DETECTED NOT DETECTED Final   Serratia marcescens NOT DETECTED NOT DETECTED Final   Haemophilus influenzae NOT DETECTED NOT DETECTED Final   Neisseria meningitidis NOT DETECTED NOT DETECTED Final   Pseudomonas aeruginosa NOT DETECTED NOT DETECTED Final   Stenotrophomonas maltophilia NOT DETECTED NOT DETECTED Final   Candida albicans NOT DETECTED NOT DETECTED Final   Candida auris NOT DETECTED NOT DETECTED Final   Candida glabrata NOT DETECTED NOT DETECTED Final   Candida krusei NOT DETECTED NOT DETECTED Final   Candida parapsilosis NOT DETECTED NOT DETECTED Final   Candida tropicalis NOT DETECTED NOT DETECTED Final   Cryptococcus neoformans/gattii NOT DETECTED NOT DETECTED Final    Comment: Performed at [facility], [address] [place], [place] [address]  Resp panel by RT-PCR (RSV, Flu A&B, Covid) Anterior Nasal Swab     Status: None   Collection Time: 04/16/24  7:19 AM   Specimen: Anterior Nasal Swab  Result Value Ref Range Status   SARS Coronavirus 2 by RT PCR NEGATIVE NEGATIVE Final    Comment: (NOTE) SARS-CoV-2 target nucleic acids are NOT DETECTED.  The SARS-CoV-2 RNA is  generally detectable in upper respiratory specimens  during the acute phase of infection. The lowest concentration of SARS-CoV-2 viral copies this assay can detect is 138 copies/mL. A negative result does not preclude SARS-Cov-2 infection and should not be used as the sole basis for treatment or other patient management decisions. A negative result may occur with  improper specimen collection/handling, submission of specimen other than nasopharyngeal swab, presence of viral mutation(s) within the areas targeted by this assay, and inadequate number of viral copies(<138 copies/mL). A negative result must be combined with clinical observations, patient history, and epidemiological information. The expected result is Negative.  Fact Sheet for Patients:  BloggerCourse.com  Fact Sheet for Healthcare Providers:  SeriousBroker.it  This test is no t yet approved or cleared by the [place]  FDA and  has been authorized for detection and/or diagnosis of SARS-CoV-2 by FDA under an Emergency Use Authorization (EUA). This EUA will remain  in effect (meaning this test can be used) for the duration of the COVID-19 declaration under Section 564(b)(1) of the Act, 21 U.S.C.section 360bbb-3(b)(1), unless the authorization is terminated  or revoked sooner.       Influenza A by PCR NEGATIVE NEGATIVE Final   Influenza B by PCR NEGATIVE NEGATIVE Final    Comment: (NOTE) The Xpert Xpress SARS-CoV-2/FLU/RSV plus assay is intended as an aid in the diagnosis of influenza from Nasopharyngeal swab specimens and should not be used as a sole basis for treatment. Nasal washings and aspirates are unacceptable for Xpert Xpress SARS-CoV-2/FLU/RSV testing.  Fact Sheet for Patients: BloggerCourse.com  Fact Sheet for Healthcare Providers: SeriousBroker.it  This test is not yet approved or cleared by the [place] FDA and has been authorized for detection and/or diagnosis of SARS-CoV-2 by FDA under an Emergency Use Authorization (EUA). This EUA will remain in effect (meaning this test can be used) for the duration of the COVID-19 declaration under Section 564(b)(1) of the Act, 21 U.S.C. section 360bbb-3(b)(1), unless the authorization is terminated or revoked.     Resp Syncytial Virus by PCR NEGATIVE NEGATIVE Final    Comment: (NOTE) Fact Sheet for Patients: BloggerCourse.com  Fact Sheet for Healthcare Providers: SeriousBroker.it  This test is not yet approved or cleared by the [place]  FDA and has been authorized for detection and/or diagnosis of SARS-CoV-2 by FDA under an Emergency Use Authorization (EUA). This EUA will remain in effect (meaning this test can be used) for the duration of the COVID-19 declaration under Section 564(b)(1) of the Act, 21 U.S.C. section 360bbb-3(b)(1), unless the authorization is terminated or revoked.  Performed at [facility], [address] [place], [place] [address]   MRSA Next Gen by PCR, Nasal     Status: None   Collection Time: 04/16/24 12:52 PM   Specimen: Nasal Mucosa; Nasal Swab  Result Value Ref Range Status   MRSA by PCR Next Gen NOT DETECTED NOT DETECTED Final    Comment: (NOTE) The GeneXpert MRSA Assay (FDA approved for NASAL specimens only), is one component of a comprehensive MRSA colonization surveillance program. It is not intended to diagnose MRSA infection nor to guide or monitor treatment for MRSA infections. Test performance is not FDA approved in patients less than 32 years old. Performed at [facility], [address] [place], [place] [address]      Labs: BNP (last 3 results) No results for input(s): BNP in the last 8760 hours. Basic Metabolic Panel: Recent Labs  Lab 04/17/24 0413 04/18/24 0341 04/23/24 0450  NA 139 137  --  K 3.4*  4.4  --   CL 106 108  --   CO2 26 25  --   GLUCOSE 91 117*  --   BUN 15 17  --   CREATININE 0.59 0.54 0.60  CALCIUM  8.9 8.9  --   MG  --  1.2*  --   PHOS  --  2.7  --    Liver Function Tests: No results for input(s): AST, ALT, ALKPHOS, BILITOT, PROT, ALBUMIN in the last 168 hours. No results for input(s): LIPASE, AMYLASE in the last 168 hours. No results for input(s): AMMONIA in the last 168 hours. CBC: Recent Labs  Lab 04/17/24 0413 04/18/24 0341  WBC 9.7 10.8*  NEUTROABS  --  7.1  HGB 9.2* 8.6*  HCT 30.4* 28.0*  MCV 83.1 81.6  PLT 487* 500*   Cardiac Enzymes: No results for input(s): CKTOTAL, CKMB, CKMBINDEX, TROPONINI in the last 168 hours. BNP: Invalid input(s): POCBNP CBG: Recent Labs  Lab 04/21/24 0337 04/21/24 2032  GLUCAP 100* 227*   D-Dimer No results for input(s): DDIMER in the last 72 hours. Hgb A1c No results for input(s): HGBA1C in the last 72 hours. Lipid Profile No results for input(s): CHOL, HDL, LDLCALC, TRIG, CHOLHDL, LDLDIRECT in the last 72 hours. Thyroid function studies No results for input(s): TSH, T4TOTAL, T3FREE, THYROIDAB in the last 72 hours.  Invalid input(s): FREET3 Anemia work up No results for input(s): VITAMINB12, FOLATE, FERRITIN, TIBC, IRON, RETICCTPCT in the last 72 hours. Urinalysis    Component Value Date/Time   COLORURINE YELLOW (A) 03/28/2021 0956   APPEARANCEUR CLEAR (A) 03/28/2021 0956   APPEARANCEUR Clear 10/22/2012 0404   LABSPEC 1.026 03/28/2021 0956   LABSPEC 1.035 10/22/2012 0404   PHURINE 5.0 03/28/2021 0956   GLUCOSEU 50 (A) 03/28/2021 0956   GLUCOSEU Negative 10/22/2012 0404   HGBUR NEGATIVE 03/28/2021 0956   BILIRUBINUR NEGATIVE 03/28/2021 0956   BILIRUBINUR Negative 10/22/2012 0404   KETONESUR NEGATIVE 03/28/2021 0956   PROTEINUR NEGATIVE 03/28/2021 0956   UROBILINOGEN 0.2 06/14/2011 1805   NITRITE NEGATIVE 03/28/2021 0956    LEUKOCYTESUR TRACE (A) 03/28/2021 0956   LEUKOCYTESUR Negative 10/22/2012 0404   Sepsis Labs Recent Labs  Lab 04/17/24 0413 04/18/24 0341  WBC 9.7 10.8*   Microbiology Recent Results (from the past 240 hours)  Culture, blood (Routine x 2)     Status: None   Collection Time: 04/16/24  6:35 AM   Specimen: BLOOD LEFT ARM  Result Value Ref Range Status   Specimen Description BLOOD LEFT ARM  Final   Special Requests   Final    BOTTLES DRAWN AEROBIC AND ANAEROBIC Blood Culture adequate volume   Culture   Final    NO GROWTH 5 DAYS Performed at [facility], [address], [place], [place] [address]    Report Status 04/21/2024 FINAL  Final  Culture, blood (Routine x 2)     Status: Abnormal   Collection Time: 04/16/24  6:43 AM   Specimen: BLOOD RIGHT HAND  Result Value Ref Range Status   Specimen Description   Final    BLOOD RIGHT HAND Performed at [facility], [address] [place], [place] [address]    Special Requests   Final    BOTTLES DRAWN AEROBIC AND ANAEROBIC Blood Culture results may not be optimal due to an inadequate volume of blood received in culture bottles Performed at [facility], [address] [place], [place] [address]    Culture  Setup Time  Final    GRAM POSITIVE COCCI IN CLUSTERS IN BOTH AEROBIC AND ANAEROBIC BOTTLES CRITICAL RESULT CALLED TO, READ BACK BY AND VERIFIED WITH: PHARMD NATHAN B 0104 [id number] FCP    Culture (A)  Final    STAPHYLOCOCCUS WARNERI THE SIGNIFICANCE OF ISOLATING THIS ORGANISM FROM A SINGLE SET OF BLOOD CULTURES WHEN MULTIPLE SETS ARE DRAWN IS UNCERTAIN. PLEASE NOTIFY THE MICROBIOLOGY DEPARTMENT WITHIN ONE WEEK IF SPECIATION AND SENSITIVITIES ARE REQUIRED. Performed at [facility], [address] [place], [place] [address]    Report Status 04/19/2024 FINAL  Final  Blood Culture ID Panel (Reflexed)     Status: Abnormal   Collection Time: 04/16/24  6:43 AM  Result Value Ref Range Status    Enterococcus faecalis NOT DETECTED NOT DETECTED Final   Enterococcus Faecium NOT DETECTED NOT DETECTED Final   Listeria monocytogenes NOT DETECTED NOT DETECTED Final   Staphylococcus species DETECTED (A) NOT DETECTED Final    Comment: CRITICAL RESULT CALLED TO, READ BACK BY AND VERIFIED WITH: PHARMD NATHAN B 0104 [id number] FCP    Staphylococcus aureus (BCID) NOT DETECTED NOT DETECTED Final   Staphylococcus epidermidis NOT DETECTED NOT DETECTED Final   Staphylococcus lugdunensis NOT DETECTED NOT DETECTED Final   Streptococcus species NOT DETECTED NOT DETECTED Final   Streptococcus agalactiae NOT DETECTED NOT DETECTED Final   Streptococcus pneumoniae NOT DETECTED NOT DETECTED Final   Streptococcus pyogenes NOT DETECTED NOT DETECTED Final   A.calcoaceticus-baumannii NOT DETECTED NOT DETECTED Final   Bacteroides fragilis NOT DETECTED NOT DETECTED Final   Enterobacterales NOT DETECTED NOT DETECTED Final   Enterobacter cloacae complex NOT DETECTED NOT DETECTED Final   Escherichia coli NOT DETECTED NOT DETECTED Final   Klebsiella aerogenes NOT DETECTED NOT DETECTED Final   Klebsiella oxytoca NOT DETECTED NOT DETECTED Final   Klebsiella pneumoniae NOT DETECTED NOT DETECTED Final   Proteus species NOT DETECTED NOT DETECTED Final   Salmonella species NOT DETECTED NOT DETECTED Final   Serratia marcescens NOT DETECTED NOT DETECTED Final   Haemophilus influenzae NOT DETECTED NOT DETECTED Final   Neisseria meningitidis NOT DETECTED NOT DETECTED Final   Pseudomonas aeruginosa NOT DETECTED NOT DETECTED Final   Stenotrophomonas maltophilia NOT DETECTED NOT DETECTED Final   Candida albicans NOT DETECTED NOT DETECTED Final   Candida auris NOT DETECTED NOT DETECTED Final   Candida glabrata NOT DETECTED NOT DETECTED Final   Candida krusei NOT DETECTED NOT DETECTED Final   Candida parapsilosis NOT DETECTED NOT DETECTED Final   Candida tropicalis NOT DETECTED NOT DETECTED Final   Cryptococcus  neoformans/gattii NOT DETECTED NOT DETECTED Final    Comment: Performed at [facility], [address], [place], [place] [address]  Resp panel by RT-PCR (RSV, Flu A&B, Covid) Anterior Nasal Swab     Status: None   Collection Time: 04/16/24  7:19 AM   Specimen: Anterior Nasal Swab  Result Value Ref Range Status   SARS Coronavirus 2 by RT PCR NEGATIVE NEGATIVE Final    Comment: (NOTE) SARS-CoV-2 target nucleic acids are NOT DETECTED.  The SARS-CoV-2 RNA is generally detectable in upper respiratory specimens during the acute phase of infection. The lowest concentration of SARS-CoV-2 viral copies this assay can detect is 138 copies/mL. A negative result does not preclude SARS-Cov-2 infection and should not be used as the sole basis for treatment or other patient management decisions. A negative result may occur with  improper specimen collection/handling, submission of specimen other than nasopharyngeal swab, presence of viral mutation(s) within  the areas targeted by this assay, and inadequate number of viral copies(<138 copies/mL). A negative result must be combined with clinical observations, patient history, and epidemiological information. The expected result is Negative.  Fact Sheet for Patients:  BloggerCourse.com  Fact Sheet for Healthcare Providers:  SeriousBroker.it  This test is no t yet approved or cleared by the [place]  FDA and  has been authorized for detection and/or diagnosis of SARS-CoV-2 by FDA under an Emergency Use Authorization (EUA). This EUA will remain  in effect (meaning this test can be used) for the duration of the COVID-19 declaration under Section 564(b)(1) of the Act, 21 U.S.C.section 360bbb-3(b)(1), unless the authorization is terminated  or revoked sooner.       Influenza A by PCR NEGATIVE NEGATIVE Final   Influenza B by PCR NEGATIVE NEGATIVE Final    Comment: (NOTE) The Xpert Xpress  SARS-CoV-2/FLU/RSV plus assay is intended as an aid in the diagnosis of influenza from Nasopharyngeal swab specimens and should not be used as a sole basis for treatment. Nasal washings and aspirates are unacceptable for Xpert Xpress SARS-CoV-2/FLU/RSV testing.  Fact Sheet for Patients: BloggerCourse.com  Fact Sheet for Healthcare Providers: SeriousBroker.it  This test is not yet approved or cleared by the [place]  FDA and has been authorized for detection and/or diagnosis of SARS-CoV-2 by FDA under an Emergency Use Authorization (EUA). This EUA will remain in effect (meaning this test can be used) for the duration of the COVID-19 declaration under Section 564(b)(1) of the Act, 21 U.S.C. section 360bbb-3(b)(1), unless the authorization is terminated or revoked.     Resp Syncytial Virus by PCR NEGATIVE NEGATIVE Final    Comment: (NOTE) Fact Sheet for Patients: BloggerCourse.com  Fact Sheet for Healthcare Providers: SeriousBroker.it  This test is not yet approved or cleared by the [place]  FDA and has been authorized for detection and/or diagnosis of SARS-CoV-2 by FDA under an Emergency Use Authorization (EUA). This EUA will remain in effect (meaning this test can be used) for the duration of the COVID-19 declaration under Section 564(b)(1) of the Act, 21 U.S.C. section 360bbb-3(b)(1), unless the authorization is terminated or revoked.  Performed at [facility], [address] [place], [place] [address]   MRSA Next Gen by PCR, Nasal     Status: None   Collection Time: 04/16/24 12:52 PM   Specimen: Nasal Mucosa; Nasal Swab  Result Value Ref Range Status   MRSA by PCR Next Gen NOT DETECTED NOT DETECTED Final    Comment: (NOTE) The GeneXpert MRSA Assay (FDA approved for NASAL specimens only), is one component of a comprehensive MRSA colonization  surveillance program. It is not intended to diagnose MRSA infection nor to guide or monitor treatment for MRSA infections. Test performance is not FDA approved in patients less than 59 years old. Performed at [facility], [address], [place], [place] [address]      Time coordinating discharge: 40 minutes   SIGNED:   Calvin KATHEE Robson, MD  [facility] 04/23/2024, 10:43 AM Pager   If 7PM-7AM, please contact night-coverage

## 2024-04-23 NOTE — Telephone Encounter (Signed)
 Damien from [facility] called to say the patient has been admitted to [facility] and the patient is scheduled for a left leg angio on 04/24/24 with Dr. Jama. Specials was informed.

## 2024-04-23 NOTE — TOC Progression Note (Signed)
 Transition of Care [facility]) - Progression Note    Patient Details  Name: LILANA BLASKO MRN: [id number] Date of Birth: 1941-09-13  [facility] CM/SW Contact  Corean ONEIDA Haddock, RN Phone Number: 04/23/2024, 11:53 AM  Clinical Narrative:     Care without delay dc order noted  Spoke with Catina at [facility] who states that patient will have to be assessed prior to return.   She states that [facility] from [facility] will have to complete assessment and she will not be in office till tomorrow, but she will notify her of need for assessment tomorrow  Catina states they [facility] agency preference is [facility]  Spoke with [employer], and updated on dc plan She is in agreement to outpatient palliative and home health, and states she does not have a preference of agency.    DC summary and fl2 secure emailed to [facility]                        Expected Discharge Plan and Services         Expected Discharge Date: 04/23/24                                     Social Drivers of Health (SDOH) Interventions SDOH Screenings   Food Insecurity: No Food Insecurity (04/16/2024)  Housing: Low Risk  (04/16/2024)  Transportation Needs: No Transportation Needs (04/16/2024)  Utilities: Not At Risk (04/16/2024)  Financial Resource Strain: Low Risk  (09/22/2023)   Received from [facility]  Social Connections: Patient Unable To Answer (04/16/2024)  Tobacco Use: Medium Risk (04/16/2024)    Readmission Risk Interventions     No data to display

## 2024-04-24 ENCOUNTER — Encounter
Admission: EM | Disposition: A | Discharge status: Skilled Nursing Facility | Payer: Self-pay | Source: Skilled Nursing Facility | Attending: Internal Medicine

## 2024-04-24 ENCOUNTER — Ambulatory Visit: Admission: RE | Admit: 2024-04-24 | Source: Home / Self Care | Admitting: Vascular Surgery

## 2024-04-24 DIAGNOSIS — Z515 Encounter for palliative care: Secondary | ICD-10-CM | POA: Diagnosis not present

## 2024-04-24 DIAGNOSIS — L97909 Non-pressure chronic ulcer of unspecified part of unspecified lower leg with unspecified severity: Secondary | ICD-10-CM

## 2024-04-24 DIAGNOSIS — I70229 Atherosclerosis of native arteries of extremities with rest pain, unspecified extremity: Secondary | ICD-10-CM

## 2024-04-24 DIAGNOSIS — A419 Sepsis, unspecified organism: Secondary | ICD-10-CM | POA: Diagnosis not present

## 2024-04-24 DIAGNOSIS — F03918 Unspecified dementia, unspecified severity, with other behavioral disturbance: Secondary | ICD-10-CM | POA: Diagnosis not present

## 2024-04-24 DIAGNOSIS — L03116 Cellulitis of left lower limb: Secondary | ICD-10-CM | POA: Diagnosis not present

## 2024-04-24 DIAGNOSIS — E43 Unspecified severe protein-calorie malnutrition: Secondary | ICD-10-CM | POA: Diagnosis not present

## 2024-04-24 SURGERY — LOWER EXTREMITY INTERVENTION
Anesthesia: Moderate Sedation | Site: Leg Lower | Laterality: Left

## 2024-04-24 NOTE — TOC Progression Note (Signed)
 Transition of Care [facility]) - Progression Note    Patient Details  Name: [name] MRN: [id number] Date of Birth: Dec 29, 1940  Transition of Care [facility]) CM/SW Contact  Corean ONEIDA Haddock, RN Phone Number: 04/24/2024, 12:58 PM  Clinical Narrative:      Attempted to call [facility] to Speak with [facility] about completing and assessment.  Staff states that she is in a meeting and request a return call    1pm - Per [facility] she can assess at 830 04/25/24                 Expected Discharge Plan and Services         Expected Discharge Date: 04/23/24                                     Social Drivers of Health (SDOH) Interventions SDOH Screenings   Food Insecurity: No Food Insecurity (04/16/2024)  Housing: Low Risk  (04/16/2024)  Transportation Needs: No Transportation Needs (04/16/2024)  Utilities: Not At Risk (04/16/2024)  Financial Resource Strain: Low Risk  (09/22/2023)   Received from [facility]  Social Connections: Patient Unable To Answer (04/16/2024)  Tobacco Use: Medium Risk (04/16/2024)    Readmission Risk Interventions     No data to display

## 2024-04-24 NOTE — Progress Notes (Signed)
                                                     Palliative Care Progress Note, Assessment & Plan   Patient Name: [name]       Date: 04/24/2024 DOB: 06-28-1941  Age: 83 y.o. MRN#: [id number] Attending Physician: Jhonny Calvin NOVAK, MD Primary Care Physician: Isaiah Leisure, MD Admit Date: 04/16/2024  Subjective: Patient is lying in bed on her right side, asleep.  She easily awakens to my presence.  She makes eye contact with me.  No family or friends present during my visit.  HPI: 83 y.o. female  with past medical history of dementia, anxiety, bipolar disorder, GERD, and HLD admitted from [facility] memory care on 04/16/2024 with fever and altered mental status with new oxygen requirements.   Patient is being treated for sepsis due to cellulitis of left lower extremity, pneumonia, and acute metabolic encephalopathy.   PMT was consulted to support patient with goals of care discussions.   Summary of counseling/coordination of care: Extensive chart review completed prior to meeting patient including labs, vital signs, imaging, progress notes, orders, and available advanced directive documents from current and previous encounters.   After reviewing the patient's chart and assessing the patient at bedside, I spoke with patient in regards to symptom management and goals of care.   Symptoms assessed.  Patient denies pain or discomfort at this time.  I attempted to offer her p.o. intake and she politely declined.  She was minimally responsive but appropriately engaged in yes/no responses with me.  She says she just wants me to let her sleep.  No adjustment to [facility] needed at this time.  Plan remains for patient to discharge back to her facility with outpatient palliative services to follow. TOC following closely for discharge planning.   PMT remains  stable with patient and family throughout her hospitalization.  PMT will step back from daily visits but continue to monitor peripherally.    Physical Exam HENT:     Head: Normocephalic.     Nose: Nose normal.     Mouth/Throat:     Mouth: Mucous membranes are moist.  Eyes:     Pupils: Pupils are equal, round, and reactive to light.  Cardiovascular:     Pulses: Normal pulses.  Pulmonary:     Effort: Pulmonary effort is normal.  Abdominal:     Palpations: Abdomen is soft.  Skin:    General: Skin is warm and dry.     Coloration: Skin is pale.  Neurological:     Mental Status: She is alert.  Psychiatric:        Mood and Affect: Mood normal.             Total Time 25 minutes   Time spent includes: Detailed review of medical records (labs, imaging, vital signs), medically appropriate exam (mental status, respiratory, cardiac, skin), discussed with treatment team, counseling and educating patient, family and staff, documenting clinical information, medication management and coordination of care.  Lamarr L. Arvid, DNP, FNP-BC Palliative Medicine Team

## 2024-04-24 NOTE — Plan of Care (Signed)

## 2024-04-24 NOTE — Progress Notes (Signed)
 PROGRESS NOTE    ZAILA CREW  FMW:[id number] DOB: 05/22/1941 DOA: 04/16/2024 PCP: Isaiah Leisure, MD    Brief Narrative:   From HPI [name] is a 83 y.o. female with medical history significant of dementia, anxiety disorder, bipolar, GERD, hyperlipidemia, who lives in a facility brought in with fever as well as altered mental status and new oxygen requirement.  Also found to have worsening of right lower extremity wounds with surrounding cellulitis.  At the time patient was being seen mental status had improved some, denies chest pain, nausea vomiting chest pain or abdominal pain.  ED course: Upon arrival temperature 100.8, respiratory rate 20, pulse 79, blood pressure 131/66. Given concerns of sepsis patient was initiated on antibiotic therapy in the emergency room and hospitalist service was contacted to admit patient for further management   Assessment & Plan:   Principal Problem:   Sepsis (HCC) Active Problems:   Protein-calorie malnutrition, severe  Sepsis secondary to left lower extremity cellulitis as well as possible underlying pneumonia Patient met sepsis criteria on presentation with temperature 100.8, WBC 13.7 in the setting of cellulitis as well as pneumonia MRSA screen negative Plan: More withdrawn today.  Completed 7-day course of antibiotics.  No further antibiotics indicated at this time.  Nursing for wound care.   Acute hypoxic respiratory failure secondary to pneumonia Patient currently requiring 2 L of oxygen She does not use any oxygen at home Weaned to room air.  Pleated antibiotics as above   Acute metabolic encephalopathy secondary to above Unclear baseline.  Patient seems to wax and wane between withdrawn and engaged.  On 8/12 she is rather withdrawn.   Dementia Continue to monitor closely   Anxiety disorder, bipolar,  Continue trazodone , sertraline  and olanzapine    GERD Continue PPI     Hyperlipidemia Continue statin  therapy  Functional decline Patient with poor baseline.  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  Poor family support.  Has a DSS guardian.  Quality of life has diminished.  I feel she is appropriate for further de-escalation of care and consideration for engagement of hospice services.  Palliative care consult requested.  Multiple attempts to reach DSS guardian have been unsuccessful.  DSS guardian finally reached on 8/11.  Facility was supposed to send someone to evaluate on 8/12 however they are delaying.  Facility representative should come and evaluate 8/13 at 8:30 AM    DVT prophylaxis: Lovenox  Code Status: DNR Family Communication: None Disposition Plan: Status is: Inpatient Remains inpatient appropriate because: Pneumonia on IV antibiotics   Level of care: Telemetry Medical  Consultants:  None  Procedures:  None  Antimicrobials:   Subjective: Seen and examined.  More withdrawn today.  Does not wish to engage in conversation.  Objective: Vitals:   04/23/24 2137 04/24/24 0414 04/24/24 0500 04/24/24 0744  BP: (!) 126/57 119/61  (!) 128/56  Pulse: 83 86  72  Resp: 15 16  16   Temp: (!) 97.5 F (36.4 C) 97.9 F (36.6 C)  98 F (36.7 C)  TempSrc: Oral Oral    SpO2: 93% 91%  92%  Weight:   49.6 kg   Height:        Intake/Output Summary (Last 24 hours) at 04/24/2024 1318 Last data filed at 04/24/2024 1100 Gross per 24 hour  Intake 0 ml  Output 300 ml  Net -300 ml   Filed Weights   04/22/24 0451 04/23/24 0439 04/24/24 0500  Weight: 48.1 kg 50.4 kg 49.6 kg  Examination:  General exam: Awake and withdrawn Respiratory system: Clear lungs.  Normal work of breathing.  Room air Cardiovascular system: S1-S2, RRR, 2/6 heart murmur, normal bowel sounds Gastrointestinal system: Thin, soft, T/ND, normal bowel sounds Central nervous system: Alert and oriented. No focal neurological deficits. Extremities: Decreased power bilateral lower extremities Skin: Left leg with  chronic appearing wounds Psychiatry: Judgement and insight appear impaired. Mood & affect flattened.     Data Reviewed: I have personally reviewed following labs and imaging studies  CBC: Recent Labs  Lab 04/18/24 0341  WBC 10.8*  NEUTROABS 7.1  HGB 8.6*  HCT 28.0*  MCV 81.6  PLT 500*   Basic Metabolic Panel: Recent Labs  Lab 04/18/24 0341 04/23/24 0450  NA 137  --   K 4.4  --   CL 108  --   CO2 25  --   GLUCOSE 117*  --   BUN 17  --   CREATININE 0.54 0.60  CALCIUM  8.9  --   MG 1.2*  --   PHOS 2.7  --    GFR: Estimated Creatinine Clearance: 41 mL/min (by C-G formula based on SCr of 0.6 mg/dL). Liver Function Tests: No results for input(s): AST, ALT, ALKPHOS, BILITOT, PROT, ALBUMIN in the last 168 hours.  No results for input(s): LIPASE, AMYLASE in the last 168 hours. No results for input(s): AMMONIA in the last 168 hours. Coagulation Profile: No results for input(s): INR, PROTIME in the last 168 hours.  Cardiac Enzymes: No results for input(s): CKTOTAL, CKMB, CKMBINDEX, TROPONINI in the last 168 hours. BNP (last 3 results) No results for input(s): PROBNP in the last 8760 hours. HbA1C: No results for input(s): HGBA1C in the last 72 hours. CBG: Recent Labs  Lab 04/21/24 0337 04/21/24 2032  GLUCAP 100* 227*   Lipid Profile: No results for input(s): CHOL, HDL, LDLCALC, TRIG, CHOLHDL, LDLDIRECT in the last 72 hours. Thyroid Function Tests: No results for input(s): TSH, T4TOTAL, FREET4, T3FREE, THYROIDAB in the last 72 hours. Anemia Panel: No results for input(s): VITAMINB12, FOLATE, FERRITIN, TIBC, IRON, RETICCTPCT in the last 72 hours. Sepsis Labs: Recent Labs  Lab 04/18/24 0341  PROCALCITON <0.10    Recent Results (from the past 240 hours)  Culture, blood (Routine x 2)     Status: None   Collection Time: 04/16/24  6:35 AM   Specimen: BLOOD LEFT ARM  Result Value Ref Range Status    Specimen Description BLOOD LEFT ARM  Final   Special Requests   Final    BOTTLES DRAWN AEROBIC AND ANAEROBIC Blood Culture adequate volume   Culture   Final    NO GROWTH 5 DAYS Performed at [facility], [address], [place], [place] [address]    Report Status 04/21/2024 FINAL  Final  Culture, blood (Routine x 2)     Status: Abnormal   Collection Time: 04/16/24  6:43 AM   Specimen: BLOOD RIGHT HAND  Result Value Ref Range Status   Specimen Description   Final    BLOOD RIGHT HAND Performed at [facility], [address], [place], [place] [address]    Special Requests   Final    BOTTLES DRAWN AEROBIC AND ANAEROBIC Blood Culture results may not be optimal due to an inadequate volume of blood received in culture bottles Performed at [facility], [address] [place], [place] [address]    Culture  Setup Time   Final    GRAM POSITIVE COCCI IN CLUSTERS IN BOTH AEROBIC  AND ANAEROBIC BOTTLES CRITICAL RESULT CALLED TO, READ BACK BY AND VERIFIED WITH: PHARMD NATHAN B [id number] FCP    Culture (A)  Final    STAPHYLOCOCCUS WARNERI THE SIGNIFICANCE OF ISOLATING THIS ORGANISM FROM A SINGLE SET OF BLOOD CULTURES WHEN MULTIPLE SETS ARE DRAWN IS UNCERTAIN. PLEASE NOTIFY THE MICROBIOLOGY DEPARTMENT WITHIN ONE WEEK IF SPECIATION AND SENSITIVITIES ARE REQUIRED. Performed at [facility], [address], [place], [place] [address]    Report Status 04/19/2024 FINAL  Final  Blood Culture ID Panel (Reflexed)     Status: Abnormal   Collection Time: 04/16/24  6:43 AM  Result Value Ref Range Status   Enterococcus faecalis NOT DETECTED NOT DETECTED Final   Enterococcus Faecium NOT DETECTED NOT DETECTED Final   Listeria monocytogenes NOT DETECTED NOT DETECTED Final   Staphylococcus species DETECTED (A) NOT DETECTED Final    Comment: CRITICAL RESULT CALLED TO, READ BACK BY AND VERIFIED WITH: PHARMD NATHAN B [id number] FCP    Staphylococcus aureus (BCID) NOT  DETECTED NOT DETECTED Final   Staphylococcus epidermidis NOT DETECTED NOT DETECTED Final   Staphylococcus lugdunensis NOT DETECTED NOT DETECTED Final   Streptococcus species NOT DETECTED NOT DETECTED Final   Streptococcus agalactiae NOT DETECTED NOT DETECTED Final   Streptococcus pneumoniae NOT DETECTED NOT DETECTED Final   Streptococcus pyogenes NOT DETECTED NOT DETECTED Final   A.calcoaceticus-baumannii NOT DETECTED NOT DETECTED Final   Bacteroides fragilis NOT DETECTED NOT DETECTED Final   Enterobacterales NOT DETECTED NOT DETECTED Final   Enterobacter cloacae complex NOT DETECTED NOT DETECTED Final   Escherichia coli NOT DETECTED NOT DETECTED Final   Klebsiella aerogenes NOT DETECTED NOT DETECTED Final   Klebsiella oxytoca NOT DETECTED NOT DETECTED Final   Klebsiella pneumoniae NOT DETECTED NOT DETECTED Final   Proteus species NOT DETECTED NOT DETECTED Final   Salmonella species NOT DETECTED NOT DETECTED Final   Serratia marcescens NOT DETECTED NOT DETECTED Final   Haemophilus influenzae NOT DETECTED NOT DETECTED Final   Neisseria meningitidis NOT DETECTED NOT DETECTED Final   Pseudomonas aeruginosa NOT DETECTED NOT DETECTED Final   Stenotrophomonas maltophilia NOT DETECTED NOT DETECTED Final   Candida albicans NOT DETECTED NOT DETECTED Final   Candida auris NOT DETECTED NOT DETECTED Final   Candida glabrata NOT DETECTED NOT DETECTED Final   Candida krusei NOT DETECTED NOT DETECTED Final   Candida parapsilosis NOT DETECTED NOT DETECTED Final   Candida tropicalis NOT DETECTED NOT DETECTED Final   Cryptococcus neoformans/gattii NOT DETECTED NOT DETECTED Final    Comment: Performed at [facility], [address] [place], [place] [address]  Resp panel by RT-PCR (RSV, Flu A&B, Covid) Anterior Nasal Swab     Status: None   Collection Time: 04/16/24  7:19 AM   Specimen: Anterior Nasal Swab  Result Value Ref Range Status   SARS Coronavirus 2 by RT PCR NEGATIVE NEGATIVE Final     Comment: (NOTE) SARS-CoV-2 target nucleic acids are NOT DETECTED.  The SARS-CoV-2 RNA is generally detectable in upper respiratory specimens during the acute phase of infection. The lowest concentration of SARS-CoV-2 viral copies this assay can detect is 138 copies/mL. A negative result does not preclude SARS-Cov-2 infection and should not be used as the sole basis for treatment or other patient management decisions. A negative result may occur with  improper specimen collection/handling, submission of specimen other than nasopharyngeal swab, presence of viral mutation(s) within the areas targeted by this assay, and inadequate number of viral copies(<138  copies/mL). A negative result must be combined with clinical observations, patient history, and epidemiological information. The expected result is Negative.  Fact Sheet for Patients:  BloggerCourse.com  Fact Sheet for Healthcare Providers:  SeriousBroker.it  This test is no t yet approved or cleared by the [place]  FDA and  has been authorized for detection and/or diagnosis of SARS-CoV-2 by FDA under an Emergency Use Authorization (EUA). This EUA will remain  in effect (meaning this test can be used) for the duration of the COVID-19 declaration under Section 564(b)(1) of the Act, 21 U.S.C.section 360bbb-3(b)(1), unless the authorization is terminated  or revoked sooner.       Influenza A by PCR NEGATIVE NEGATIVE Final   Influenza B by PCR NEGATIVE NEGATIVE Final    Comment: (NOTE) The Xpert Xpress SARS-CoV-2/FLU/RSV plus assay is intended as an aid in the diagnosis of influenza from Nasopharyngeal swab specimens and should not be used as a sole basis for treatment. Nasal washings and aspirates are unacceptable for Xpert Xpress SARS-CoV-2/FLU/RSV testing.  Fact Sheet for Patients: BloggerCourse.com  Fact Sheet for Healthcare  Providers: SeriousBroker.it  This test is not yet approved or cleared by the [place]  FDA and has been authorized for detection and/or diagnosis of SARS-CoV-2 by FDA under an Emergency Use Authorization (EUA). This EUA will remain in effect (meaning this test can be used) for the duration of the COVID-19 declaration under Section 564(b)(1) of the Act, 21 U.S.C. section 360bbb-3(b)(1), unless the authorization is terminated or revoked.     Resp Syncytial Virus by PCR NEGATIVE NEGATIVE Final    Comment: (NOTE) Fact Sheet for Patients: BloggerCourse.com  Fact Sheet for Healthcare Providers: SeriousBroker.it  This test is not yet approved or cleared by the [place]  FDA and has been authorized for detection and/or diagnosis of SARS-CoV-2 by FDA under an Emergency Use Authorization (EUA). This EUA will remain in effect (meaning this test can be used) for the duration of the COVID-19 declaration under Section 564(b)(1) of the Act, 21 U.S.C. section 360bbb-3(b)(1), unless the authorization is terminated or revoked.  Performed at [facility], [address] [place], [place] [address]   MRSA Next Gen by PCR, Nasal     Status: None   Collection Time: 04/16/24 12:52 PM   Specimen: Nasal Mucosa; Nasal Swab  Result Value Ref Range Status   MRSA by PCR Next Gen NOT DETECTED NOT DETECTED Final    Comment: (NOTE) The GeneXpert MRSA Assay (FDA approved for NASAL specimens only), is one component of a comprehensive MRSA colonization surveillance program. It is not intended to diagnose MRSA infection nor to guide or monitor treatment for MRSA infections. Test performance is not FDA approved in patients less than 30 years old. Performed at [facility], [address], [place], [place] [address]          Radiology Studies: No results found.       Scheduled Meds:   acetaminophen   650 mg Oral TID   vitamin C   250 mg Oral BID   aspirin  EC  81 mg Oral Daily   enoxaparin  (LOVENOX ) injection  40 mg Subcutaneous Q24H   feeding supplement  237 mL Oral BID BM   ferrous sulfate   325 mg Oral Q breakfast   furosemide   20 mg Oral Daily   lamoTRIgine   25 mg Oral BID   leptospermum manuka honey  1 Application Topical Daily   magnesium  oxide  400 mg Oral Daily   melatonin  5 mg  Oral QHS   multivitamin with minerals  1 tablet Oral Daily   OLANZapine   5 mg Oral QHS   pantoprazole   40 mg Oral Daily   polyethylene glycol  34 g Oral Daily   senna-docusate  2 tablet Oral BID   sertraline   100 mg Oral Daily   traZODone   100 mg Oral QHS   Continuous Infusions:     LOS: 8 days      Calvin KATHEE Robson, MD [facility]   If 7PM-7AM, please contact night-coverage  04/24/2024, 1:18 PM

## 2024-04-25 DIAGNOSIS — A419 Sepsis, unspecified organism: Secondary | ICD-10-CM | POA: Diagnosis not present

## 2024-04-25 LAB — BASIC METABOLIC PANEL WITH GFR
Anion gap: 13 (ref 5–15)
BUN: 35 mg/dL — ABNORMAL HIGH (ref 8–23)
CO2: 26 mmol/L (ref 22–32)
Calcium: 10 mg/dL (ref 8.9–10.3)
Chloride: 103 mmol/L (ref 98–111)
Creatinine, Ser: 0.72 mg/dL (ref 0.44–1.00)
GFR, Estimated: >60 mL/min (ref >60)
Glucose, Bld: 117 mg/dL — ABNORMAL HIGH (ref 70–99)
Potassium: 3.8 mmol/L (ref 3.5–5.1)
Sodium: 142 mmol/L (ref 135–145)

## 2024-04-25 LAB — CBC
HCT: 36.9 % (ref 36.0–46.0)
Hemoglobin: 10.7 g/dL — ABNORMAL LOW (ref 12.0–15.0)
MCH: 24.5 pg — ABNORMAL LOW (ref 26.0–34.0)
MCHC: 29 g/dL — ABNORMAL LOW (ref 30.0–36.0)
MCV: 84.6 fL (ref 80.0–100.0)
Platelets: 600 K/uL — ABNORMAL HIGH (ref 150–400)
RBC: 4.36 MIL/uL (ref 3.87–5.11)
RDW: 17.3 % — ABNORMAL HIGH (ref 11.5–15.5)
WBC: 8.6 K/uL (ref 4.0–10.5)
nRBC: 0 % (ref 0.0–0.2)

## 2024-04-25 LAB — GLUCOSE, CAPILLARY: Glucose-Capillary: 80 mg/dL (ref 70–99)

## 2024-04-25 LAB — MAGNESIUM: Magnesium: 1.6 mg/dL — ABNORMAL LOW (ref 1.7–2.4)

## 2024-04-25 MED ORDER — SMOG ENEMA
960.0000 mL | Freq: Once | RECTAL | Status: AC
  Administered 2024-04-26: 960 mL via RECTAL
  Filled 2024-04-25: qty 960

## 2024-04-25 MED ORDER — FLEET ENEMA RE ENEM
1.0000 | ENEMA | Freq: Once | RECTAL | Status: AC
  Administered 2024-04-25 (×2): 1 via RECTAL

## 2024-04-25 MED ORDER — ORAL CARE MOUTH RINSE: 15.0000 mL | OROMUCOSAL | Status: DC | PRN

## 2024-04-25 MED ORDER — POLYETHYLENE GLYCOL 3350 17 G PO PACK
34.0000 g | PACK | ORAL | Status: AC
  Administered 2024-04-25 (×6): 34 g via ORAL
  Filled 2024-04-25 (×4): qty 2

## 2024-04-25 MED ORDER — MAGNESIUM SULFATE 2 GM/50ML IV SOLN
2.0000 g | Freq: Once | INTRAVENOUS | Status: AC
  Administered 2024-04-25 (×2): 2 g via INTRAVENOUS
  Filled 2024-04-25: qty 50

## 2024-04-25 MED ORDER — DOCUSATE SODIUM 283 MG RE ENEM: 1.0000 | ENEMA | Freq: Once | RECTAL | Status: DC

## 2024-04-25 NOTE — [place] FL2 (Signed)
 [place]  MEDICAID [id number] LEVEL OF CARE FORM     IDENTIFICATION  Patient Name: [name] Birthdate: 23-Mar-1941 Sex: female Admission Date (Current Location): 04/16/2024  [facility] and [place] Number:  Chiropodist and Address:  [facility], [address] [place], [place] [address]      Provider Number: [id number]  Attending Physician Name and Address:  Awanda City, MD  Relative Name and Phone Number:       Current Level of Care: Hospital Recommended Level of Care: Assisted Living Facility, Memory Care Prior Approval Number:    Date Approved/Denied:   PASRR Number:    Discharge Plan: Other (Comment) (ALF Memory Care)    Current Diagnoses: Patient Active Problem List   Diagnosis Date Noted   Protein-calorie malnutrition, severe 04/18/2024   Sepsis (HCC) 04/16/2024   Atherosclerosis of native arteries of the extremities with ulceration (HCC) 03/11/2024   Atherosclerosis of 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 (HCC) 05/24/2023   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) 04/29/2023   Cellulitis 04/28/2023   HCAP (healthcare-associated pneumonia) 12/19/2020   PAD (peripheral artery disease) (HCC) 12/19/2020   HLD (hyperlipidemia) 12/19/2020   Insomnia 12/19/2020   GERD (gastroesophageal reflux disease) 12/19/2020   Bipolar disorder (HCC) 12/15/2020   Dementia with behavioral disturbance (HCC) 12/15/2020   SOB (shortness of breath) 12/12/2020   Acute respiratory failure with hypoxia (HCC) 12/12/2020   Community acquired pneumonia 12/12/2020   Pressure injury of skin 09/25/2019   Fall    Closed left subtrochanteric femur fracture (HCC) 09/21/2019    Orientation RESPIRATION BLADDER Height & Weight     Self, Time, Situation, Place  Normal Incontinent Weight: 49.9 kg Height:  5' 7 (170.2 cm)  BEHAVIORAL SYMPTOMS/MOOD NEUROLOGICAL BOWEL NUTRITION STATUS      Continent Diet (Regular/Thin Liquids)  AMBULATORY  STATUS COMMUNICATION OF NEEDS Skin    (Reported non-ambulatory at baseline) Verbally Skin abrasions, Bruising, Other (Comment) (Blisters see DC summary for wound care)                       Personal Care Assistance Level of Assistance  Bathing, Feeding, Dressing Bathing Assistance: Maximum assistance Feeding assistance: Maximum assistance Dressing Assistance: Maximum assistance     Functional Limitations Info             SPECIAL CARE FACTORS FREQUENCY  PT (By licensed PT), OT (By licensed OT)     PT Frequency: Home health OT Frequency: Home Health            Contractures Contractures Info: Not present    Additional Factors Info  Code Status Code Status Info: DNR             Current Medications (04/25/2024):  This is the current hospital active medication list Current Facility-Administered Medications  Medication Dose Route Frequency Provider Last Rate Last Admin   acetaminophen  (TYLENOL ) tablet 650 mg  650 mg Oral TID Arvid Collar, FNP   650 mg at 04/23/24 2151   ascorbic acid  (VITAMIN C ) tablet 250 mg  250 mg Oral BID Djan, Prince T, MD   250 mg at 04/23/24 2151   aspirin  EC tablet 81 mg  81 mg Oral Daily Djan, Prince T, MD   81 mg at 04/23/24 0858   enoxaparin  (LOVENOX ) injection 40 mg  40 mg Subcutaneous Q24H Dorinda Homans T, MD   40 mg at 04/24/24 2202   feeding supplement (ENSURE PLUS HIGH PROTEIN) liquid 237  mL  237 mL Oral BID BM Dorinda Homans T, MD   237 mL at 04/23/24 1342   ferrous sulfate  tablet 325 mg  325 mg Oral Q breakfast Sreenath, Sudheer B, MD   325 mg at 04/23/24 0858   furosemide  (LASIX ) tablet 20 mg  20 mg Oral Daily Djan, Prince T, MD   20 mg at 04/23/24 9140   lamoTRIgine  (LAMICTAL ) tablet 25 mg  25 mg Oral BID Dorinda Homans DASEN, MD   25 mg at 04/23/24 2152   leptospermum manuka honey (MEDIHONEY) paste 1 Application  1 Application Topical Daily Dorinda Homans DASEN, MD   1 Application at 04/23/24 0900   magnesium  oxide (MAG-OX) tablet 400 mg  400  mg Oral Daily Sreenath, Sudheer B, MD   400 mg at 04/23/24 0859   melatonin tablet 5 mg  5 mg Oral QHS Dorinda Homans T, MD   5 mg at 04/23/24 2151   multivitamin with minerals tablet 1 tablet  1 tablet Oral Daily Dorinda Homans DASEN, MD   1 tablet at 04/23/24 0859   OLANZapine  (ZYPREXA ) tablet 5 mg  5 mg Oral QHS Dorinda Homans T, MD   5 mg at 04/23/24 2153   Oral care mouth rinse  15 mL Mouth Rinse PRN Awanda City, MD       oxyCODONE  (Oxy IR/ROXICODONE ) immediate release tablet 5-10 mg  5-10 mg Oral Q4H PRN Sreenath, Sudheer B, MD   5 mg at 04/22/24 1058   pantoprazole  (PROTONIX ) EC tablet 40 mg  40 mg Oral Daily Djan, Prince T, MD   40 mg at 04/23/24 0858   polyethylene glycol (MIRALAX  / GLYCOLAX ) packet 34 g  34 g Oral Daily Djan, Prince T, MD   34 g at 04/23/24 0858   senna-docusate (Senokot-S) tablet 2 tablet  2 tablet Oral BID Dorinda Homans DASEN, MD   2 tablet at 04/23/24 2151   sertraline  (ZOLOFT ) tablet 100 mg  100 mg Oral Daily Djan, Prince T, MD   100 mg at 04/23/24 0859   traZODone  (DESYREL ) tablet 100 mg  100 mg Oral QHS Djan, Prince T, MD   100 mg at 04/23/24 2151     Discharge Medications: TAKE these medications     acetaminophen  325 MG tablet Commonly known as: TYLENOL  Take 650 mg by mouth 3 (three) times daily.    ascorbic acid  250 MG tablet Commonly known as: VITAMIN C  Take 1 tablet (250 mg total) by mouth 2 (two) times daily.    aspirin  EC 81 MG tablet Take 1 tablet (81 mg total) by mouth daily. Swallow whole.    cholecalciferol  25 MCG (1000 UNIT) tablet Commonly known as: VITAMIN D3 Take 1,000 Units by mouth daily.    feeding supplement Liqd Take 237 mLs by mouth 2 (two) times daily between meals.    ferrous sulfate  325 (65 FE) MG tablet Take 1 tablet (325 mg total) by mouth daily with breakfast.    furosemide  20 MG tablet Commonly known as: LASIX  Take 20 mg by mouth daily.    lamoTRIgine  25 MG tablet Commonly known as: LAMICTAL  Take 25 mg by mouth 2 (two) times  daily.    magnesium  oxide 400 MG tablet Commonly known as: MAG-OX Take 400 mg by mouth daily.    melatonin 5 MG Tabs Take 5 mg by mouth at bedtime.    OLANZapine  5 MG tablet Commonly known as: ZYPREXA  Take 5 mg by mouth at bedtime.    omeprazole 20 MG capsule Commonly  known as: PRILOSEC Take 20 mg by mouth daily.    polyethylene glycol 17 g packet Commonly known as: MIRALAX  / GLYCOLAX  Take 34 g by mouth daily.    senna-docusate 8.6-50 MG tablet Commonly known as: Senokot-S Take 2 tablets by mouth 2 (two) times daily.    sertraline  100 MG tablet Commonly known as: ZOLOFT  Take 100 mg by mouth daily.    traZODone  100 MG tablet Commonly known as: DESYREL  Take 100 mg by mouth at bedtime.      Relevant Imaging Results:  Relevant Lab Results:   Additional Information SS#: [id number]. Has a legal guardian through [facility] DSS: Silvestre Oliphant [phone]. Lives at [facility].  Aleecia Tapia M Ginnie Marich, RN

## 2024-04-25 NOTE — Progress Notes (Addendum)
  PROGRESS NOTE    [name]  FMW:[id number] DOB: Oct 09, 1940 DOA: 04/16/2024 PCP: Amber Leisure, MD  211A/[id number]-AA  LOS: 9 days   Brief hospital course:   Assessment & Plan: [name] is a 83 y.o. female with medical history significant of dementia, anxiety disorder, bipolar, GERD, hyperlipidemia, who lives in a facility brought in with fever as well as altered mental status and new oxygen requirement.  Also found to have worsening of right lower extremity wounds with surrounding cellulitis.    Sepsis secondary to left lower extremity cellulitis as well as possible underlying pneumonia Patient met sepsis criteria on presentation with temperature 100.8, WBC 13.7 in the setting of cellulitis as well as pneumonia MRSA screen negative Completed 7-day course of antibiotics.  No further antibiotics indicated at this time.   --wound care per order   Acute hypoxic respiratory failure secondary to pneumonia Patient currently requiring 2 L of oxygen She does not use any oxygen at home Weaned to room air.  completed antibiotics as above   Acute metabolic encephalopathy secondary to above Unclear baseline.  Patient seems to wax and wane between withdrawn and engaged.     Dementia   Anxiety disorder, bipolar,  Continue trazodone , sertraline  and olanzapine  and lamictal    GERD Continue PPI     Hyperlipidemia Continue statin therapy   Functional decline Patient with poor baseline.  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  Poor family support.  Has a DSS guardian.    Constipation --aggressive Miralax  --enema  Severe malnutrition    DVT prophylaxis: Lovenox  SQ Code Status: DNR  Family Communication:  Level of care: Telemetry Medical Dispo:   The patient is from: memory care Anticipated d/c is to: memory care Anticipated d/c date is: tomorrow   Subjective and Interval History:  Staff reported pt sweaty and more lethargic in the morning, however, appeared comfortable and  feeding herself lunch during rounds.  Per records, pt hasn't had a BM in many days.   Objective: Vitals:   04/24/24 2113 04/25/24 0329 04/25/24 1721 04/25/24 2004  BP: 116/61 109/74 112/70 (!) 108/59  Pulse: 70 77 71 70  Resp: 16 18 16 18   Temp:  98.5 F (36.9 C) 98.2 F (36.8 C) 97.8 F (36.6 C)  TempSrc: Oral Oral Axillary Oral  SpO2: 92% 95% 99% 96%  Weight:  49.9 kg    Height:        Intake/Output Summary (Last 24 hours) at 04/25/2024 2203 Last data filed at 04/25/2024 1300 Gross per 24 hour  Intake 0 ml  Output --  Net 0 ml   Filed Weights   04/23/24 0439 04/24/24 0500 04/25/24 0329  Weight: 50.4 kg 49.6 kg 49.9 kg    Examination:   Constitutional: NAD, alert HEENT: conjunctivae and lids normal, EOMI CV: No cyanosis.   RESP: normal respiratory effort, on RA Neuro: II - XII grossly intact.     Data Reviewed: I have personally reviewed labs and imaging studies  Time spent: 35 minutes  Amber Haber, MD [facility] If 7PM-7AM, please contact night-coverage 04/25/2024, 10:03 PM

## 2024-04-25 NOTE — Discharge Summary (Addendum)
 Physician Discharge Summary   [name]  female DOB: 03-May-1941  FMW:[id number]  PCP: Isaiah Leisure, MD  Admit date: 04/16/2024 Discharge date: 04/26/2024  Admitted From: [facility]  Disposition:  [facility]  Home Health: Yes CODE STATUS: DNR  Discharge Instructions     Amb Referral to Palliative Care   Complete by: As directed    Discharge wound care:   Complete by: As directed    Wound care  Daily      Comments: R leg: Cleanse with Vashe #[id number], pat dry the peri-wound skin, not rinse after use. Apply Aquacel [id number] into the wound bed, cover with gauze or ABD pad. Apply on the peri-wound skin the barrier cream available in the clean storage. Wrap with Kerlix, add ace wrap if is necessary. Change daily or PRN. Wrap from the base of the toes to the knee, do not apply compression to the bandage.   R foot: Cleanse with Vashe #[id number], pat dry the peri-wound skin, not rinse after use. Apply Medihoney on the wound bed, cover with gauze or foam dressing, wrap with stretch bandage to hold the dressings. Change daily.      Hospital Course:  For full details, please see H&P, progress notes, consult notes and ancillary notes.  Briefly,  [name] is a 83 y.o. female with medical history significant of dementia, anxiety disorder, bipolar, who lives in a facility, brought in with fever as well as altered mental status and new oxygen requirement.  Also found to have worsening of right lower extremity wounds with surrounding cellulitis.   Sepsis secondary to left lower extremity cellulitis as well as possible underlying pneumonia Patient met sepsis criteria on presentation with temperature 100.8, WBC 13.7 in the setting of cellulitis as well as pneumonia MRSA screen negative --Completed 7-day course of ceftriaxone .   --wound care per order.   Acute hypoxic respiratory failure secondary to pneumonia --presented with 2L O2 new requirement.  She does not use any oxygen at  home Weaned to room air prior to discharge.   Acute metabolic encephalopathy secondary to above Baseline dementia Patient seems to wax and wane between withdrawn and engaged.     Anxiety disorder, bipolar,  Continue trazodone , sertraline , Lamictal  and olanzapine  --not taking clonazepam  PTA   GERD Continue PPI   Hyperlipidemia --not taking Lipitor PTA.   Functional decline Patient with poor baseline.  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  Poor family support.  Has a DSS guardian.    Severe malnutrition  Supplements.   Unless noted above, medications under STOP list are ones pt was not taking PTA.  Discharge Diagnoses:  Principal Problem:   Sepsis (HCC) Active Problems:   Protein-calorie malnutrition, severe   30 Day Unplanned Readmission Risk Score    Flowsheet Row ED to Hosp-Admission (Current) from 04/16/2024 in [facility]  30 Day Unplanned Readmission Risk Score (%) 17.89 Filed at 04/25/2024 1200    This score is the patient's risk of an unplanned readmission within 30 days of being discharged (0 -100%). The score is based on dignosis, age, lab data, medications, orders, and past utilization.   Low:  0-14.9   Medium: 15-21.9   High: 22-29.9   Extreme: 30 and above         Discharge Instructions:  Allergies as of 04/26/2024   No Known Allergies      Medication List     STOP taking these medications    atorvastatin  10 MG  tablet Commonly known as: LIPITOR   clonazePAM  0.5 MG tablet Commonly known as: KLONOPIN    clopidogrel  75 MG tablet Commonly known as: PLAVIX    Multivitamin Adult (Minerals) Tabs   ondansetron  4 MG tablet Commonly known as: ZOFRAN    oxyCODONE  5 MG immediate release tablet Commonly known as: Oxy IR/ROXICODONE        TAKE these medications    acetaminophen  325 MG tablet Commonly known as: TYLENOL  Take 650 mg by mouth 3 (three) times daily.   ascorbic acid  250 MG tablet Commonly  known as: VITAMIN C  Take 1 tablet (250 mg total) by mouth 2 (two) times daily.   aspirin  EC 81 MG tablet Take 1 tablet (81 mg total) by mouth daily. Swallow whole.   cholecalciferol  25 MCG (1000 UNIT) tablet Commonly known as: VITAMIN D3 Take 1,000 Units by mouth daily.   feeding supplement Liqd Take 237 mLs by mouth 2 (two) times daily between meals.   ferrous sulfate  325 (65 FE) MG tablet Take 1 tablet (325 mg total) by mouth daily with breakfast.   furosemide  20 MG tablet Commonly known as: LASIX  Take 20 mg by mouth daily.   lamoTRIgine  25 MG tablet Commonly known as: LAMICTAL  Take 25 mg by mouth 2 (two) times daily.   magnesium  oxide 400 MG tablet Commonly known as: MAG-OX Take 400 mg by mouth daily.   melatonin 5 MG Tabs Take 5 mg by mouth at bedtime.   OLANZapine  5 MG tablet Commonly known as: ZYPREXA  Take 5 mg by mouth at bedtime.   omeprazole 20 MG capsule Commonly known as: PRILOSEC Take 20 mg by mouth daily.   polyethylene glycol 17 g packet Commonly known as: MIRALAX  / GLYCOLAX  Take 34 g by mouth daily.   senna-docusate 8.6-50 MG tablet Commonly known as: Senokot-S Take 2 tablets by mouth 2 (two) times daily.   sertraline  100 MG tablet Commonly known as: ZOLOFT  Take 100 mg by mouth daily.   traZODone  100 MG tablet Commonly known as: DESYREL  Take 100 mg by mouth at bedtime.               Discharge Care Instructions  (From admission, onward)           Start     Ordered   04/23/24 0000  Discharge wound care:       Comments: Wound care  Daily      Comments: R leg: Cleanse with Vashe #[id number], pat dry the peri-wound skin, not rinse after use. Apply Aquacel [id number] into the wound bed, cover with gauze or ABD pad. Apply on the peri-wound skin the barrier cream available in the clean storage. Wrap with Kerlix, add ace wrap if is necessary. Change daily or PRN. Wrap from the base of the toes to the knee, do not apply compression to the  bandage.   R foot: Cleanse with Vashe #[id number], pat dry the peri-wound skin, not rinse after use. Apply Medihoney on the wound bed, cover with gauze or foam dressing, wrap with stretch bandage to hold the dressings. Change daily.   04/23/24 1043             Follow-up Information     Isaiah Leisure, MD Follow up in 1 week(s).   Specialty: Internal Medicine Contact information: [address] [place] [address] [phone]                 No Known Allergies   The results of significant diagnostics from this hospitalization (including imaging, microbiology, ancillary  and laboratory) are listed below for reference.   Consultations:   Procedures/Studies: CT HEAD WO CONTRAST ( ) Result Date: 04/16/2024 EXAM: CT HEAD WITHOUT 04/16/2024 04:26:00 PM TECHNIQUE: CT of the head was performed without the administration of intravenous contrast. Automated exposure control, iterative reconstruction, and/or weight based adjustment of the mA/kV was utilized to reduce the radiation dose to as low as reasonably achievable. COMPARISON: 03/28/2021 CLINICAL HISTORY: Stroke, follow up. Sepsis. FINDINGS: BRAIN AND VENTRICLES: No acute intracranial hemorrhage. No mass effect or midline shift. No extra-axial fluid collection. Gray-white differentiation is maintained. No hydrocephalus. Chronic ischemic white matter changes. ORBITS: No acute abnormality. SINUSES AND MASTOIDS: No acute abnormality. SOFT TISSUES AND SKULL: No acute skull fracture. No acute soft tissue abnormality. Atherosclerotic calcifications of the internal carotid arteries at the skull base. IMPRESSION: 1. No acute intracranial abnormality. 2. Chronic ischemic white matter changes. 3. Atherosclerotic calcifications of the internal carotid arteries at the skull base. Electronically signed by: Franky Stanford MD 04/16/2024 07:27 PM EDT RP Workstation: [id number]   DG Chest Portable 1 View Result Date: 04/16/2024 EXAM: 1 VIEW XRAY OF THE  CHEST 04/16/2024 07:20:52 AM COMPARISON: 12/19/2020. CLINICAL HISTORY: Sepsis. FINDINGS: LUNGS AND PLEURA: Significant resolution of the bibasilar opacities with some residual linear opacities. No pulmonary edema. No pleural effusion. No pneumothorax. HEART AND MEDIASTINUM: No acute abnormality of the cardiac and mediastinal silhouettes. Aortic atherosclerosis. BONES AND SOFT TISSUES: No acute osseous abnormality. IMPRESSION: 1. Significant resolution of the bibasilar opacities with some residual linear opacities. Electronically signed by: Katheleen Faes MD 04/16/2024 07:26 AM EDT RP Workstation: [id number]      Labs: BNP (last 3 results) No results for input(s): BNP in the last 8760 hours. Basic Metabolic Panel: Recent Labs  Lab 04/23/24 0450 04/25/24 1539 04/26/24 0432  NA  --  142 143  K  --  3.8 4.4  CL  --  103 105  CO2  --  26 28  GLUCOSE  --  117* 127*  BUN  --  35* 38*  CREATININE 0.60 0.72 0.59  CALCIUM   --  10.0 10.1  MG  --  1.6* 2.1   Liver Function Tests: No results for input(s): AST, ALT, ALKPHOS, BILITOT, PROT, ALBUMIN in the last 168 hours. No results for input(s): LIPASE, AMYLASE in the last 168 hours. No results for input(s): AMMONIA in the last 168 hours. CBC: Recent Labs  Lab 04/25/24 1539 04/26/24 0432  WBC 8.6 7.9  HGB 10.7* 11.4*  HCT 36.9 38.8  MCV 84.6 83.4  PLT 600* 587*   Cardiac Enzymes: No results for input(s): CKTOTAL, CKMB, CKMBINDEX, TROPONINI in the last 168 hours. BNP: Invalid input(s): POCBNP CBG: Recent Labs  Lab 04/21/24 0337 04/21/24 2032 04/25/24 1350  GLUCAP 100* 227* 80   D-Dimer No results for input(s): DDIMER in the last 72 hours. Hgb A1c No results for input(s): HGBA1C in the last 72 hours. Lipid Profile No results for input(s): CHOL, HDL, LDLCALC, TRIG, CHOLHDL, LDLDIRECT in the last 72 hours. Thyroid function studies No results for input(s): TSH, T4TOTAL, T3FREE,  THYROIDAB in the last 72 hours.  Invalid input(s): FREET3 Anemia work up No results for input(s): VITAMINB12, FOLATE, FERRITIN, TIBC, IRON, RETICCTPCT in the last 72 hours. Urinalysis    Component Value Date/Time   COLORURINE YELLOW (A) 03/28/2021 0956   APPEARANCEUR CLEAR (A) 03/28/2021 0956   APPEARANCEUR Clear 10/22/2012 0404   LABSPEC 1.026 03/28/2021 0956   LABSPEC 1.035 10/22/2012 0404   PHURINE 5.0 03/28/2021 9043  GLUCOSEU 50 (A) 03/28/2021 0956   GLUCOSEU Negative 10/22/2012 0404   HGBUR NEGATIVE 03/28/2021 0956   BILIRUBINUR NEGATIVE 03/28/2021 0956   BILIRUBINUR Negative 10/22/2012 0404   KETONESUR NEGATIVE 03/28/2021 0956   PROTEINUR NEGATIVE 03/28/2021 0956   UROBILINOGEN 0.2 06/14/2011 1805   NITRITE NEGATIVE 03/28/2021 0956   LEUKOCYTESUR TRACE (A) 03/28/2021 0956   LEUKOCYTESUR Negative 10/22/2012 0404   Sepsis Labs Recent Labs  Lab 04/25/24 1539 04/26/24 0432  WBC 8.6 7.9   Microbiology Recent Results (from the past 240 hours)  MRSA Next Gen by PCR, Nasal     Status: None   Collection Time: 04/16/24 12:52 PM   Specimen: Nasal Mucosa; Nasal Swab  Result Value Ref Range Status   MRSA by PCR Next Gen NOT DETECTED NOT DETECTED Final    Comment: (NOTE) The GeneXpert MRSA Assay (FDA approved for NASAL specimens only), is one component of a comprehensive MRSA colonization surveillance program. It is not intended to diagnose MRSA infection nor to guide or monitor treatment for MRSA infections. Test performance is not FDA approved in patients less than 52 years old. Performed at [facility], [address] [place], [place] [address]      Total time spend on discharging this patient, including the last patient exam, discussing the hospital stay, instructions for ongoing care as it relates to all pertinent caregivers, as well as preparing the medical discharge records, prescriptions, and/or referrals as applicable, is 25  minutes.    Ellouise Haber, MD  [facility] 04/26/2024, 8:12 AM

## 2024-04-25 NOTE — Progress Notes (Addendum)
 Nutrition Follow Up Note   DOCUMENTATION CODES:   Severe malnutrition in context of social or environmental circumstances  INTERVENTION:   Ensure Plus High Protein po BID, each supplement provides 350 kcal and 20 grams of protein  Magic cup TID with meals, each supplement provides 290 kcal and 9 grams of protein  MVI po daily   Vitamin C  250mg  po BID   Liberal diet   Assist with meals  Pt remains at refeed risk; recommend checking potassium, magnesium  and phosphorus labs.   Daily weights   NUTRITION DIAGNOSIS:   Severe Malnutrition related to social / environmental circumstances as evidenced by severe fat depletion, severe muscle depletion. -ongoing   GOAL:   Patient will meet greater than or equal to 90% of their needs -not met   MONITOR:   PO intake, Supplement acceptance, Labs, Weight trends, Skin, I & O's  ASSESSMENT:   83 y/o female with h/o bipolar disorder, dementia, anxiety, PAD, HLD and GERD who is admitted with PNA, left lower extremity cellulitis and sepsis.  Met with pt in room today. Pt laying in bed, drenched in sweat and reporting that she is cold and does not feel well; MD notified. Pt is more lethargic today than when this RD last saw her on 8/5. Pt was previously eating 100% of meals but has started to refuse meals. Pt is asking to eat today. RN and NT at bedside getting patient in position to assist her with eating. Pt remains at refeed risk. No labs since 8/6. No BM noted since 8/5; MD notified. Per chart, pt is down ~2lbs since admission. Pt is receiving lasix . Pt planned to discharge to SNF today.   Medications reviewed and include: vitamin C , aspirin , lovenox , ferrous sulfate , lasix , Mg oxide, melatonin, MVI, protonix , miralax , senokot  Labs reviewed: blood glucose 80  Diet Order:   Diet Order             Diet - low sodium heart healthy           Diet regular Room service appropriate? Yes; Fluid consistency: Thin  Diet effective now                   EDUCATION NEEDS:   Education needs have been addressed  Skin:  Skin Assessment: Reviewed RN Assessment (lower extremities wounds on L leg and foot: peripheral vascular disease cellulitis, great toe, metatarsal head and 2nd toe)  Last BM:  8/5- constipation  Height:   Ht Readings from Last 1 Encounters:  04/16/24 5' 7 (1.702 m)    Weight:   Wt Readings from Last 1 Encounters:  04/25/24 49.9 kg    Ideal Body Weight:  61.36 kg  BMI:  Body mass index is 17.23 kg/m.  Estimated Nutritional Needs:   Kcal:  1400-1600kcal/day  Protein:  70-80g/day  Fluid:  1.4-1.6L/day  Augustin Shams MS, RD, LDN If unable to be reached, please send secure chat to RD inpatient available from 8:00a-4:00p daily

## 2024-04-25 NOTE — TOC Transition Note (Signed)
 Transition of Care [facility]) - Discharge Note   Patient Details  Name: [name] MRN: [id number] Date of Birth: [birth year]  Transition of Care [facility]) CM/SW Contact:  Asberry CHRISTELLA Jaksch, RN Phone Number: 04/25/2024, 2:23 PM   Clinical Narrative:     Patient will DC to: [facility] (Memory Care) Anticipated DC date: 04/26/2024 Family notified: [place] DSS Legal Guardian Transport by: [facility]  Per MD patient ready for DC to [facility]. RN, patient, patient's legal guardian, and facility notified of DC. FL2 and Discharge Summary sent to facility. DC packet on chart. Marinell with [facility] notified of DC. EMS transport arranged.   TOC signing off.   Final next level of care: Assisted Living [facility]) Barriers to Discharge: Barriers Resolved   Patient Goals and CMS Choice Patient states their goals for this hospitalization and ongoing recovery are:: Returning to [facility]          Discharge Placement                  Name of family member notified: [place] DSS Legal Guardian Patient and family notified of of transfer: 04/25/24  Discharge Plan and Services Additional resources added to the After Visit Summary for                                       Social Drivers of Health (SDOH) Interventions SDOH Screenings   Food Insecurity: No Food Insecurity (04/16/2024)  Housing: Low Risk  (04/16/2024)  Transportation Needs: No Transportation Needs (04/16/2024)  Utilities: Not At Risk (04/16/2024)  Financial Resource Strain: Low Risk  (09/22/2023)   Received from [facility]  Social Connections: Patient Unable To Answer (04/16/2024)  Tobacco Use: Medium Risk (04/16/2024)     Readmission Risk Interventions     No data to display

## 2024-04-26 DIAGNOSIS — A419 Sepsis, unspecified organism: Secondary | ICD-10-CM | POA: Diagnosis not present

## 2024-04-26 LAB — BASIC METABOLIC PANEL WITH GFR
Anion gap: 10 (ref 5–15)
BUN: 38 mg/dL — ABNORMAL HIGH (ref 8–23)
CO2: 28 mmol/L (ref 22–32)
Calcium: 10.1 mg/dL (ref 8.9–10.3)
Chloride: 105 mmol/L (ref 98–111)
Creatinine, Ser: 0.59 mg/dL (ref 0.44–1.00)
GFR, Estimated: >60 mL/min (ref >60)
Glucose, Bld: 127 mg/dL — ABNORMAL HIGH (ref 70–99)
Potassium: 4.4 mmol/L (ref 3.5–5.1)
Sodium: 143 mmol/L (ref 135–145)

## 2024-04-26 LAB — CBC
HCT: 38.8 % (ref 36.0–46.0)
Hemoglobin: 11.4 g/dL — ABNORMAL LOW (ref 12.0–15.0)
MCH: 24.5 pg — ABNORMAL LOW (ref 26.0–34.0)
MCHC: 29.4 g/dL — ABNORMAL LOW (ref 30.0–36.0)
MCV: 83.4 fL (ref 80.0–100.0)
Platelets: 587 K/uL — ABNORMAL HIGH (ref 150–400)
RBC: 4.65 MIL/uL (ref 3.87–5.11)
RDW: 17.2 % — ABNORMAL HIGH (ref 11.5–15.5)
WBC: 7.9 K/uL (ref 4.0–10.5)
nRBC: 0 % (ref 0.0–0.2)

## 2024-04-26 LAB — MAGNESIUM: Magnesium: 2.1 mg/dL (ref 1.7–2.4)

## 2024-04-26 NOTE — Progress Notes (Signed)
 Occupational Therapy Treatment Patient Details Name: ELOIS AVERITT MRN: [id number] DOB: 11/22/1940 Today's Date: 04/26/2024   History of present illness ASHLEEN DEMMA is a 83 y.o. female with medical history significant of dementia, anxiety disorder, bipolar, GERD, hyperlipidemia, who lives in a facility brought in with fever as well as altered mental status and new oxygen requirement.  Also found to have worsening of right lower extremity wounds with surrounding cellulitis.   OT comments  Upon entering the room, pt supine in bed with eyes closed. Pt will not engage with therapist this session other than to say no when offered assistance with mobility and self care tasks. Pt needing total A to roll L <> R for repositioning in bed with pillow placement for skin integrity concerns. Pt wakes up briefly and states she does not feel well and does not want to participate further. Bed alarm activated and all needs within reach. Pt's guardian arrived to room as therapist exits for update and RN notified.       If plan is discharge home, recommend the following:  A lot of help with walking and/or transfers;A lot of help with bathing/dressing/bathroom;Supervision due to cognitive status;Assist for transportation   Equipment Recommendations  None recommended by OT       Precautions / Restrictions Precautions Precautions: Fall       Mobility Bed Mobility Overal bed mobility: Needs Assistance Bed Mobility: Rolling Rolling: Total assist              Transfers                             ADL either performed or assessed with clinical judgement    Extremity/Trunk Assessment Upper Extremity Assessment Upper Extremity Assessment: Generalized weakness   Lower Extremity Assessment Lower Extremity Assessment: Generalized weakness        Vision Patient Visual Report: No change from baseline               Cognition Arousal: Lethargic Behavior During Therapy: Flat  affect Cognition: History of cognitive impairments                                                      Pertinent Vitals/ Pain       Pain Assessment Pain Assessment: No/denies pain         Frequency  Min 2X/week        Progress Toward Goals  OT Goals(current goals can now be found in the care plan section)  Progress towards OT goals: Not progressing toward goals - comment      AM-PAC OT 6 Clicks Daily Activity     Outcome Measure   Help from another person eating meals?: A Little Help from another person taking care of personal grooming?: A Little Help from another person toileting, which includes using toliet, bedpan, or urinal?: A Lot Help from another person bathing (including washing, rinsing, drying)?: A Lot Help from another person to put on and taking off regular upper body clothing?: A Lot Help from another person to put on and taking off regular lower body clothing?: A Lot 6 Click Score: 14    End of Session    OT Visit Diagnosis: Other abnormalities of gait and mobility (R26.89);Muscle weakness (generalized) (M62.81)   Activity  Tolerance Patient limited by lethargy   Patient Left in bed;with call bell/phone within reach;with bed alarm set;with family/visitor present   Nurse Communication Mobility status        Time: 8894-8884 OT Time Calculation (min): 10 min  Charges: OT General Charges $OT Visit: 1 Visit OT Treatments $Therapeutic Activity: 8-22 mins  Izetta Claude, MS, OTR/L , CBIS ascom [phone]  04/26/24, 11:19 AM

## 2024-04-26 NOTE — Plan of Care (Signed)
   Problem: Education: Goal: Knowledge of General Education information will improve Description: Including pain rating scale, medication(s)/side effects and non-pharmacologic comfort measures Outcome: Not Progressing   Problem: Clinical Measurements: Goal: Will remain free from infection Outcome: Progressing

## 2024-04-26 NOTE — Plan of Care (Signed)

## 2024-04-30 ENCOUNTER — Other Ambulatory Visit: Payer: Self-pay

## 2024-04-30 ENCOUNTER — Emergency Department

## 2024-04-30 ENCOUNTER — Inpatient Hospital Stay
Admission: EM | Admit: 2024-04-30 | Discharge: 2024-05-09 | DRG: 299 | Disposition: A | Discharge status: Nursing Home (Includes ICF) | Source: Skilled Nursing Facility | Attending: Internal Medicine | Admitting: Internal Medicine

## 2024-04-30 ENCOUNTER — Inpatient Hospital Stay

## 2024-04-30 DIAGNOSIS — I872 Venous insufficiency (chronic) (peripheral): Secondary | ICD-10-CM | POA: Diagnosis present

## 2024-04-30 DIAGNOSIS — L97524 Non-pressure chronic ulcer of other part of left foot with necrosis of bone: Secondary | ICD-10-CM | POA: Diagnosis present

## 2024-04-30 DIAGNOSIS — L03116 Cellulitis of left lower limb: Secondary | ICD-10-CM | POA: Diagnosis present

## 2024-04-30 DIAGNOSIS — F0284 Dementia in other diseases classified elsewhere, unspecified severity, with anxiety: Secondary | ICD-10-CM | POA: Diagnosis present

## 2024-04-30 DIAGNOSIS — F03918 Unspecified dementia, unspecified severity, with other behavioral disturbance: Secondary | ICD-10-CM | POA: Diagnosis not present

## 2024-04-30 DIAGNOSIS — Z96642 Presence of left artificial hip joint: Secondary | ICD-10-CM | POA: Diagnosis present

## 2024-04-30 DIAGNOSIS — L97929 Non-pressure chronic ulcer of unspecified part of left lower leg with unspecified severity: Secondary | ICD-10-CM

## 2024-04-30 DIAGNOSIS — I70222 Atherosclerosis of native arteries of extremities with rest pain, left leg: Secondary | ICD-10-CM | POA: Diagnosis not present

## 2024-04-30 DIAGNOSIS — F0390 Unspecified dementia without behavioral disturbance: Secondary | ICD-10-CM | POA: Diagnosis not present

## 2024-04-30 DIAGNOSIS — Z515 Encounter for palliative care: Secondary | ICD-10-CM

## 2024-04-30 DIAGNOSIS — I739 Peripheral vascular disease, unspecified: Secondary | ICD-10-CM | POA: Diagnosis present

## 2024-04-30 DIAGNOSIS — Z7982 Long term (current) use of aspirin: Secondary | ICD-10-CM | POA: Diagnosis not present

## 2024-04-30 DIAGNOSIS — Z8701 Personal history of pneumonia (recurrent): Secondary | ICD-10-CM

## 2024-04-30 DIAGNOSIS — J9601 Acute respiratory failure with hypoxia: Secondary | ICD-10-CM | POA: Diagnosis present

## 2024-04-30 DIAGNOSIS — G309 Alzheimer's disease, unspecified: Secondary | ICD-10-CM | POA: Diagnosis present

## 2024-04-30 DIAGNOSIS — F02818 Dementia in other diseases classified elsewhere, unspecified severity, with other behavioral disturbance: Secondary | ICD-10-CM | POA: Diagnosis present

## 2024-04-30 DIAGNOSIS — Z95820 Peripheral vascular angioplasty status with implants and grafts: Secondary | ICD-10-CM

## 2024-04-30 DIAGNOSIS — R5381 Other malaise: Secondary | ICD-10-CM | POA: Diagnosis present

## 2024-04-30 DIAGNOSIS — I96 Gangrene, not elsewhere classified: Secondary | ICD-10-CM | POA: Diagnosis present

## 2024-04-30 DIAGNOSIS — E43 Unspecified severe protein-calorie malnutrition: Secondary | ICD-10-CM | POA: Diagnosis present

## 2024-04-30 DIAGNOSIS — L089 Local infection of the skin and subcutaneous tissue, unspecified: Principal | ICD-10-CM

## 2024-04-30 DIAGNOSIS — L97429 Non-pressure chronic ulcer of left heel and midfoot with unspecified severity: Secondary | ICD-10-CM | POA: Diagnosis present

## 2024-04-30 DIAGNOSIS — Z79899 Other long term (current) drug therapy: Secondary | ICD-10-CM | POA: Diagnosis not present

## 2024-04-30 DIAGNOSIS — F0283 Dementia in other diseases classified elsewhere, unspecified severity, with mood disturbance: Secondary | ICD-10-CM | POA: Diagnosis present

## 2024-04-30 DIAGNOSIS — Z66 Do not resuscitate: Secondary | ICD-10-CM | POA: Diagnosis present

## 2024-04-30 DIAGNOSIS — Z681 Body mass index (BMI) 19 or less, adult: Secondary | ICD-10-CM | POA: Diagnosis not present

## 2024-04-30 DIAGNOSIS — M86172 Other acute osteomyelitis, left ankle and foot: Secondary | ICD-10-CM | POA: Diagnosis not present

## 2024-04-30 DIAGNOSIS — I70262 Atherosclerosis of native arteries of extremities with gangrene, left leg: Principal | ICD-10-CM | POA: Diagnosis present

## 2024-04-30 DIAGNOSIS — F319 Bipolar disorder, unspecified: Secondary | ICD-10-CM | POA: Diagnosis present

## 2024-04-30 DIAGNOSIS — Z90710 Acquired absence of both cervix and uterus: Secondary | ICD-10-CM

## 2024-04-30 DIAGNOSIS — I5032 Chronic diastolic (congestive) heart failure: Secondary | ICD-10-CM | POA: Diagnosis present

## 2024-04-30 DIAGNOSIS — Z89422 Acquired absence of other left toe(s): Secondary | ICD-10-CM

## 2024-04-30 DIAGNOSIS — Z87891 Personal history of nicotine dependence: Secondary | ICD-10-CM | POA: Diagnosis not present

## 2024-04-30 DIAGNOSIS — Z91198 Patient's noncompliance with other medical treatment and regimen for other reason: Secondary | ICD-10-CM

## 2024-04-30 LAB — CBC WITH DIFFERENTIAL/PLATELET
Abs Immature Granulocytes: 0.03 K/uL (ref 0.00–0.07)
Basophils Absolute: 0 K/uL (ref 0.0–0.1)
Basophils Relative: 0 %
Eosinophils Absolute: 0.6 K/uL — ABNORMAL HIGH (ref 0.0–0.5)
Eosinophils Relative: 5 %
HCT: 32.6 % — ABNORMAL LOW (ref 36.0–46.0)
Hemoglobin: 10 g/dL — ABNORMAL LOW (ref 12.0–15.0)
Immature Granulocytes: 0 %
Lymphocytes Relative: 19 %
Lymphs Abs: 2.1 K/uL (ref 0.7–4.0)
MCH: 25.6 pg — ABNORMAL LOW (ref 26.0–34.0)
MCHC: 30.7 g/dL (ref 30.0–36.0)
MCV: 83.4 fL (ref 80.0–100.0)
Monocytes Absolute: 1 K/uL (ref 0.1–1.0)
Monocytes Relative: 9 %
Neutro Abs: 7.3 K/uL (ref 1.7–7.7)
Neutrophils Relative %: 67 %
Platelets: 520 K/uL — ABNORMAL HIGH (ref 150–400)
RBC: 3.91 MIL/uL (ref 3.87–5.11)
RDW: 17.7 % — ABNORMAL HIGH (ref 11.5–15.5)
WBC: 11 K/uL — ABNORMAL HIGH (ref 4.0–10.5)
nRBC: 0 % (ref 0.0–0.2)

## 2024-04-30 LAB — COMPREHENSIVE METABOLIC PANEL WITH GFR
ALT: 14 U/L (ref 0–44)
AST: 19 U/L (ref 15–41)
Albumin: 2.8 g/dL — ABNORMAL LOW (ref 3.5–5.0)
Alkaline Phosphatase: 72 U/L (ref 38–126)
Anion gap: 11 (ref 5–15)
BUN: 37 mg/dL — ABNORMAL HIGH (ref 8–23)
CO2: 25 mmol/L (ref 22–32)
Calcium: 9.4 mg/dL (ref 8.9–10.3)
Chloride: 105 mmol/L (ref 98–111)
Creatinine, Ser: 0.64 mg/dL (ref 0.44–1.00)
GFR, Estimated: >60 mL/min (ref >60)
Glucose, Bld: 110 mg/dL — ABNORMAL HIGH (ref 70–99)
Potassium: 3.5 mmol/L (ref 3.5–5.1)
Sodium: 141 mmol/L (ref 135–145)
Total Bilirubin: 0.4 mg/dL (ref 0.0–1.2)
Total Protein: 6.3 g/dL — ABNORMAL LOW (ref 6.5–8.1)

## 2024-04-30 LAB — RESP PANEL BY RT-PCR (RSV, FLU A&B, COVID)  RVPGX2
Influenza A by PCR: NEGATIVE
Influenza B by PCR: NEGATIVE
Resp Syncytial Virus by PCR: NEGATIVE
SARS Coronavirus 2 by RT PCR: NEGATIVE

## 2024-04-30 LAB — LIPASE, BLOOD: Lipase: 28 U/L (ref 11–51)

## 2024-04-30 LAB — TROPONIN I (HIGH SENSITIVITY)
Troponin I (High Sensitivity): 6 ng/L (ref <18)
Troponin I (High Sensitivity): 5 ng/L (ref <18)

## 2024-04-30 LAB — BRAIN NATRIURETIC PEPTIDE: B Natriuretic Peptide: 44.2 pg/mL (ref 0.0–100.0)

## 2024-04-30 MED ORDER — SENNOSIDES-DOCUSATE SODIUM 8.6-50 MG PO TABS
2.0000 | ORAL_TABLET | Freq: Two times a day (BID) | ORAL | Status: DC
  Administered 2024-05-01 – 2024-05-08 (×5): 2 via ORAL
  Filled 2024-04-30 (×15): qty 2

## 2024-04-30 MED ORDER — POLYETHYLENE GLYCOL 3350 17 G PO PACK
34.0000 g | PACK | Freq: Every day | ORAL | Status: DC
  Administered 2024-05-01 – 2024-05-06 (×3): 34 g via ORAL
  Filled 2024-04-30 (×8): qty 2

## 2024-04-30 MED ORDER — MAGNESIUM OXIDE 400 MG PO TABS
400.0000 mg | ORAL_TABLET | Freq: Every day | ORAL | Status: DC
  Administered 2024-04-30 – 2024-05-06 (×5): 400 mg via ORAL
  Filled 2024-04-30 (×18): qty 1

## 2024-04-30 MED ORDER — ENOXAPARIN SODIUM 40 MG/0.4ML IJ SOSY
40.0000 mg | PREFILLED_SYRINGE | INTRAMUSCULAR | Status: DC
  Administered 2024-04-30 – 2024-05-08 (×6): 40 mg via SUBCUTANEOUS
  Filled 2024-04-30 (×7): qty 0.4

## 2024-04-30 MED ORDER — OXYCODONE HCL 5 MG PO TABS
5.0000 mg | ORAL_TABLET | ORAL | Status: DC | PRN
  Administered 2024-05-08 (×2): 5 mg via ORAL
  Filled 2024-04-30 (×2): qty 1

## 2024-04-30 MED ORDER — BISACODYL 5 MG PO TBEC: 5.0000 mg | DELAYED_RELEASE_TABLET | Freq: Every day | ORAL | Status: DC | PRN

## 2024-04-30 MED ORDER — VANCOMYCIN HCL IN DEXTROSE 1-5 GM/200ML-% IV SOLN
1000.0000 mg | INTRAVENOUS | Status: DC
  Filled 2024-04-30: qty 200

## 2024-04-30 MED ORDER — IOHEXOL 350 MG/ML SOLN
100.0000 mL | Freq: Once | INTRAVENOUS | Status: AC | PRN
  Administered 2024-04-30: 100 mL via INTRAVENOUS

## 2024-04-30 MED ORDER — OLANZAPINE 5 MG PO TABS
5.0000 mg | ORAL_TABLET | Freq: Every day | ORAL | Status: DC
  Administered 2024-04-30 – 2024-05-08 (×7): 5 mg via ORAL
  Filled 2024-04-30 (×10): qty 1

## 2024-04-30 MED ORDER — POLYETHYLENE GLYCOL 3350 17 G PO PACK: 17.0000 g | PACK | Freq: Every day | ORAL | Status: DC | PRN

## 2024-04-30 MED ORDER — VANCOMYCIN HCL IN DEXTROSE 1-5 GM/200ML-% IV SOLN
1000.0000 mg | Freq: Once | INTRAVENOUS | Status: AC
  Administered 2024-04-30: 1000 mg via INTRAVENOUS
  Filled 2024-04-30: qty 200

## 2024-04-30 MED ORDER — ACETAMINOPHEN 650 MG RE SUPP: 650.0000 mg | Freq: Four times a day (QID) | RECTAL | Status: DC | PRN

## 2024-04-30 MED ORDER — MORPHINE SULFATE (PF) 2 MG/ML IV SOLN
2.0000 mg | INTRAVENOUS | Status: DC | PRN
  Administered 2024-05-02: 2 mg via INTRAVENOUS
  Filled 2024-04-30 (×2): qty 1

## 2024-04-30 MED ORDER — HYDRALAZINE HCL 20 MG/ML IJ SOLN: 5.0000 mg | INTRAMUSCULAR | Status: DC | PRN

## 2024-04-30 MED ORDER — TRAZODONE HCL 50 MG PO TABS
100.0000 mg | ORAL_TABLET | Freq: Every day | ORAL | Status: DC
  Administered 2024-04-30 – 2024-05-08 (×6): 100 mg via ORAL
  Filled 2024-04-30 (×3): qty 1
  Filled 2024-04-30: qty 2
  Filled 2024-04-30 (×2): qty 1
  Filled 2024-04-30: qty 2
  Filled 2024-04-30 (×2): qty 1

## 2024-04-30 MED ORDER — GADOBUTROL 1 MMOL/ML IV SOLN: 5.0000 mL | Freq: Once | INTRAVENOUS | Status: DC | PRN

## 2024-04-30 MED ORDER — ONDANSETRON HCL 4 MG PO TABS
4.0000 mg | ORAL_TABLET | Freq: Four times a day (QID) | ORAL | Status: DC | PRN
Start: 2024-04-30 — End: 2024-05-09

## 2024-04-30 MED ORDER — DOCUSATE SODIUM 100 MG PO CAPS
100.0000 mg | ORAL_CAPSULE | Freq: Two times a day (BID) | ORAL | Status: DC
  Administered 2024-05-01 – 2024-05-08 (×6): 100 mg via ORAL
  Filled 2024-04-30 (×15): qty 1

## 2024-04-30 MED ORDER — PIPERACILLIN-TAZOBACTAM 3.375 G IVPB 30 MIN
3.3750 g | Freq: Once | INTRAVENOUS | Status: AC
  Administered 2024-04-30: 3.375 g via INTRAVENOUS
  Filled 2024-04-30: qty 50

## 2024-04-30 MED ORDER — ACETAMINOPHEN 325 MG PO TABS: 650.0000 mg | ORAL_TABLET | Freq: Four times a day (QID) | ORAL | Status: DC | PRN

## 2024-04-30 MED ORDER — SODIUM CHLORIDE 0.9 % IV SOLN
3.0000 g | Freq: Four times a day (QID) | INTRAVENOUS | Status: DC
  Administered 2024-04-30 – 2024-05-04 (×15): 3 g via INTRAVENOUS
  Filled 2024-04-30 (×16): qty 8

## 2024-04-30 MED ORDER — ONDANSETRON HCL 4 MG/2ML IJ SOLN: 4.0000 mg | Freq: Four times a day (QID) | INTRAMUSCULAR | Status: DC | PRN

## 2024-04-30 MED ORDER — SERTRALINE HCL 50 MG PO TABS
100.0000 mg | ORAL_TABLET | Freq: Every day | ORAL | Status: DC
Start: 2024-04-30 — End: 2024-05-09
  Administered 2024-05-01 – 2024-05-06 (×4): 100 mg via ORAL
  Filled 2024-04-30 (×8): qty 2

## 2024-04-30 MED ORDER — LAMOTRIGINE 25 MG PO TABS
25.0000 mg | ORAL_TABLET | Freq: Two times a day (BID) | ORAL | Status: DC
  Administered 2024-04-30 – 2024-05-08 (×11): 25 mg via ORAL
  Filled 2024-04-30 (×18): qty 1

## 2024-04-30 MED ORDER — ASPIRIN 81 MG PO TBEC
81.0000 mg | DELAYED_RELEASE_TABLET | Freq: Every day | ORAL | Status: DC
  Administered 2024-05-01 – 2024-05-06 (×4): 81 mg via ORAL
  Filled 2024-04-30 (×8): qty 1

## 2024-04-30 MED ORDER — MELATONIN 5 MG PO TABS
5.0000 mg | ORAL_TABLET | Freq: Every day | ORAL | Status: DC
  Administered 2024-04-30 – 2024-05-08 (×6): 5 mg via ORAL
  Filled 2024-04-30 (×9): qty 1

## 2024-04-30 MED ORDER — FERROUS SULFATE 325 (65 FE) MG PO TABS
325.0000 mg | ORAL_TABLET | Freq: Every day | ORAL | Status: DC
  Administered 2024-05-01 – 2024-05-04 (×2): 325 mg via ORAL
  Filled 2024-04-30 (×3): qty 1

## 2024-04-30 MED ORDER — LINEZOLID 600 MG/300ML IV SOLN: 600.0000 mg | Freq: Two times a day (BID) | INTRAVENOUS | Status: DC

## 2024-04-30 MED ORDER — PANTOPRAZOLE SODIUM 40 MG PO TBEC
40.0000 mg | DELAYED_RELEASE_TABLET | Freq: Every day | ORAL | Status: DC
  Administered 2024-05-01 – 2024-05-06 (×4): 40 mg via ORAL
  Filled 2024-04-30 (×8): qty 1

## 2024-04-30 MED ORDER — SODIUM CHLORIDE 0.9% FLUSH
3.0000 mL | Freq: Two times a day (BID) | INTRAVENOUS | Status: DC
  Administered 2024-04-30 – 2024-05-08 (×13): 3 mL via INTRAVENOUS

## 2024-04-30 NOTE — ED Notes (Signed)
 Wound to LLE red, warm, with open area x 2. Wound cleansed and wet to dry dressing applied.

## 2024-04-30 NOTE — ED Triage Notes (Addendum)
 PT BIB [facility] FROM [facility] D/T SOB since 0300.  Pt was recently admitted here for sepsis d/t leg infection.   Pt was 90 % onnRA when EMS arrived and they placed her on 2L O2 - was 96 % with 2L. Pt is confused at baseline - has demnetia  BP 118/60 HR 80 RR 13 98.3 Oral Temp

## 2024-04-30 NOTE — Consult Note (Signed)
 Pharmacy Antibiotic Note  [name] is a 83 y.o. female admitted on 04/30/2024 with shortness of breath.  Patient had recent admission for sepsis associated with LLE cellulitis and pneumonia. Pharmacy has been consulted for Vancomycin  dosing. Patient is also receiving Unasyn .  Plan: Vancomycin  1g IV x 1 ordered as loading dose, followed by: Vancomycin  1000 mg IV Q 36 hrs. Goal AUC 400-550. Expected AUC: 504.9 SCr used: 0.8(actual 0.64), Vd used: 0.72, TBW<IBW   Height: 5' 7 (170.2 cm) Weight: 48.6 kg (107 lb 2.3 oz) IBW/kg (Calculated) : 61.6  Temp (24hrs), Avg:98.6 F (37 C), Min:98.4 F (36.9 C), Max:98.7 F (37.1 C)  Recent Labs  Lab 04/25/24 1539 04/26/24 0432 04/30/24 0712  WBC 8.6 7.9 11.0*  CREATININE 0.72 0.59 0.64    Estimated Creatinine Clearance: 40.2 mL/min (by C-G formula based on SCr of 0.64 mg/dL).    No Known Allergies  Antimicrobials this admission: Vancomycin  8/18 >>  Unasyn  8/18 >>  Zosyn  8/18 >>  Dose adjustments this admission: N/A  Microbiology results: 8/18 BCx: to be collected   Thank you for allowing pharmacy to be a part of this patient's care.  Booker Bhatnagar A Nykerria Macconnell 04/30/2024 2:14 PM

## 2024-04-30 NOTE — H&P (Signed)
 History and Physical    Patient: [name] FMW:[id number] DOB: 27-Aug-1941 DOA: 04/30/2024 DOS: the patient was seen and examined on 04/30/2024 PCP: Merilee Browning, CRNA  Patient coming from: ALF/ILF; NOK: Legal guardian, [facility], [phone]   Chief Complaint: SOB  HPI: [name] is a 83 y.o. female with medical history significant of dementia who presented from [facility] on  8/18 with SOB after recent admission for sepsis associated with LLE cellulitis and ?PNA.  Patient has little to say but is oriented x 2 and will answer questions appropriately.  After discussion with DSS guardian, situation was discussed with patient and she was clear that I do not want to have my leg taken off.  She would prefer transition to comfort should that situation arise.  She is willing to have MRI for further evaluation.  I have spoken with the legal guardian.  She is on palliative with [facility].  Uncertain GOC.  She is declining and continues to do so.  Moving into SNF was a goal.  FL2 will need to change to SNF rather than memory care.  She is DNR.    ER Course:  Necrotic foot wounds.  Previously treated for PNA and cellulitis.  Complained about SOB and 87% on RA, started on 2L [place] O2 (on RA at time of dc).  Vascular and podiatry will consult, MRI ordered.     Review of Systems: As mentioned in the history of present illness. All other systems reviewed and are negative. Past Medical History:  Diagnosis Date   Alzheimer's dementia (HCC)    Insomnia    Low back pain    Vomiting    Past Surgical History:  Procedure Laterality Date   AMPUTATION TOE Left 05/06/2023   Procedure: AMPUTATION TOE 2nd toe;  Surgeon: Silva Juliene SAUNDERS, DPM;  Location: [facility] ORS;  Service: Orthopedics/Podiatry;  Laterality: Left;   HIP ARTHROPLASTY Left 09/22/2019   Procedure: ARTHROPLASTY BIPOLAR HIP (HEMIARTHROPLASTY) RIGHT;  Surgeon: Edie Norleen PARAS, MD;  Location: [facility] ORS;  Service: Orthopedics;   Laterality: Left;   LOWER EXTREMITY INTERVENTION Left 05/04/2023   Procedure: LOWER EXTREMITY INTERVENTION;  Surgeon: Jama Cordella MATSU, MD;  Location: [facility] INVASIVE CV LAB;  Service: Cardiovascular;  Laterality: Left;   VAGINAL HYSTERECTOMY     Social History:  reports that she has quit smoking. She has never used smokeless tobacco. She reports that she does not currently use alcohol. She reports that she does not currently use drugs.  No Known Allergies  No family history on file.  Prior to Admission medications   Medication Sig Start Date End Date Taking? Authorizing Provider  acetaminophen  (TYLENOL ) 325 MG tablet Take 650 mg by mouth 3 (three) times daily. 03/02/24   [provider]  ascorbic acid  (VITAMIN C ) 250 MG tablet Take 1 tablet (250 mg total) by mouth 2 (two) times daily. 04/23/24   Jhonny Calvin NOVAK, MD  aspirin  EC 81 MG tablet Take 1 tablet (81 mg total) by mouth daily. Swallow whole. 05/11/23   Fausto Burnard LABOR, DO  cholecalciferol  (VITAMIN D3) 25 MCG (1000 UT) tablet Take 1,000 Units by mouth daily.    [provider]  feeding supplement, ENSURE ENLIVE, (ENSURE ENLIVE) LIQD Take 237 mLs by mouth 2 (two) times daily between meals. 09/27/19   Tobie Yetta HERO, MD  ferrous sulfate  325 (65 FE) MG tablet Take 1 tablet (325 mg total) by mouth daily with breakfast. 05/11/23   Fausto Burnard A, DO  furosemide  (LASIX )  20 MG tablet Take 20 mg by mouth daily. 03/12/24   [provider]  lamoTRIgine  (LAMICTAL ) 25 MG tablet Take 25 mg by mouth 2 (two) times daily.    [provider]  magnesium  oxide (MAG-OX) 400 MG tablet Take 400 mg by mouth daily.    [provider]  melatonin 5 MG TABS Take 5 mg by mouth at bedtime.    [provider]  OLANZapine  (ZYPREXA ) 5 MG tablet Take 5 mg by mouth at bedtime.    [provider]  omeprazole (PRILOSEC) 20 MG capsule Take 20 mg by mouth daily. 11/09/23   [provider]   polyethylene glycol (MIRALAX  / GLYCOLAX ) 17 g packet Take 34 g by mouth daily. 05/11/23   Fausto Burnard LABOR, DO  senna-docusate (SENOKOT-S) 8.6-50 MG tablet Take 2 tablets by mouth 2 (two) times daily.    [provider]  sertraline  (ZOLOFT ) 100 MG tablet Take 100 mg by mouth daily. 02/21/23   [provider]  traZODone  (DESYREL ) 100 MG tablet Take 100 mg by mouth at bedtime. 02/21/23   [provider]    Physical Exam: Vitals:   04/30/24 0730 04/30/24 0800 04/30/24 0900 04/30/24 1040  BP: (!) 113/48 101/80 (!) 106/39   Pulse: 74 80 70   Resp: 20 16 17    Temp:    98.4 F (36.9 C)  TempSrc:    Oral  SpO2: 98% 100% 99%   Weight:      Height:       General:  Appears calm and comfortable and is in NAD, on [place] O2 Eyes:   normal lids, iris ENT:  grossly normal hearing, lips & tongue, mmm Cardiovascular:  RRR.  Respiratory:   CTA bilaterally with no wheezes/rales/rhonchi.  Normal respiratory effort. Abdomen:  soft, NT, ND Skin:  marked stasis dermatitis with ulceration of RLE with scattered necrotic-appearing lesions including R anteromedial ankle, 1st and 3rd toes, and L medial MTP region       Musculoskeletal:  as above Psychiatric:  blunted mood and affect, speech sparse but appropriate, AOx2 but able to provide consent regarding NOT desiring BKA if recommended Neurologic:  CN 2-12 grossly intact, moves all extremities in coordinated fashion   Radiological Exams on Admission: Independently reviewed - see discussion in A/P where applicable  CT ABDOMEN PELVIS W CONTRAST Result Date: 04/30/2024 CLINICAL DATA:  Abdominal pain, acute, nonlocalized EXAM: CT ABDOMEN AND PELVIS WITH CONTRAST TECHNIQUE: Multidetector CT imaging of the abdomen and pelvis was performed using the standard protocol following bolus administration of intravenous contrast. RADIATION DOSE REDUCTION: This exam was performed according to the departmental dose-optimization program which  includes automated exposure control, adjustment of the mA and/or kV according to patient size and/or use of iterative reconstruction technique. CONTRAST:  OMNIPAQUE  IOHEXOL  350 MG/ML SOLN COMPARISON:  CT abdomen/pelvis dated 10/22/2012. FINDINGS: Lower chest: Please refer to the same-day CTA chest for description of intrathoracic findings. Hepatobiliary: 9 mm focal hypodensity in the left hepatic lobe likely represents a benign cyst or hemangioma. Gallbladder is unremarkable. No biliary dilatation. Pancreas: Increased size of a dominant hypoattenuating cystic lesion at the pancreatic body currently measuring 2.0 x 1.6 cm in axial dimension and 2.8 cm in craniocaudal dimension (series 11, image 16 and series 14, image 22), this previously measured approximately 1.2 x 1.1 cm on the prior exam dated 10/22/2012 when measured in a similar manner. Additional surrounding smaller cystic foci also appear increased in size. There is suspected communication with the  main pancreatic duct which measures up to 5 mm in diameter. Stable 1.1 cm fat density lesion at the pancreatic uncinate. No surrounding inflammatory changes. Spleen: Normal in size without focal abnormality. Adrenals/Urinary Tract: Adrenal glands are unremarkable. Kidneys enhance symmetrically. No suspicious focal lesion. No urolithiasis or hydronephrosis. Bladder is unremarkable. Stomach/Bowel: Stomach and small bowel are grossly unremarkable. No obstruction. Moderate volume of stool throughout the colon. Vascular/Lymphatic: Abdominal aorta is normal in caliber with atherosclerotic calcification. Partially visualized left superficial femoral arterial stent. Prominent left inguinal lymph node measures up to 12 mm in short axis (series 11, image 69). Reproductive: Status post hysterectomy. No adnexal masses. Other: No abdominopelvic ascites.  No intraperitoneal free air. Musculoskeletal: Diffuse osseous demineralization. Status post left hip arthroplasty.  Redemonstrated chronic T12 compression deformity. IMPRESSION: 1. No acute localizing findings in the abdomen or pelvis. 2. Increased size of a 2.0 x 1.6 x 2.8 cm hypoattenuating cystic lesion at the pancreatic body compared to the prior exam dated 10/22/2012. There is suspected communication with the main pancreatic duct which appears mildly prominent. Additional surrounding smaller cystic foci also appear increased in size. Differential considerations include IPMN, other pancreatic cystic neoplasms, or pseudocyst. Consider follow-up contrast-enhanced CT to monitor for stability, if clinically warranted. 3. Prominent left inguinal lymph node measuring up to 12 mm in short axis is nonspecific and may be reactive. 4.  Aortic Atherosclerosis (ICD10-I70.0). Electronically Signed   By: Harrietta Sherry M.D.   On: 04/30/2024 10:45   CT Angio Chest PE W and/or Wo Contrast Result Date: 04/30/2024 CLINICAL DATA:  Pulmonary embolism (PE) suspected, high prob Shortness of breath this morning. Recent admission for leg infection and sepsis. EXAM: CT ANGIOGRAPHY CHEST WITH CONTRAST TECHNIQUE: Multidetector CT imaging of the chest was performed using the standard protocol during bolus administration of intravenous contrast. Multiplanar CT image reconstructions and MIPs were obtained to evaluate the vascular anatomy. RADIATION DOSE REDUCTION: This exam was performed according to the departmental dose-optimization program which includes automated exposure control, adjustment of the mA and/or kV according to patient size and/or use of iterative reconstruction technique. CONTRAST:  OMNIPAQUE  IOHEXOL  350 MG/ML SOLN COMPARISON:  Radiographs 04/30/2024. Chest CTA 12/12/2020. Abdominal CT 04/30/2024 dictated separately. FINDINGS: Cardiovascular: The pulmonary arteries are well opacified with contrast to the level of the segmental branches. There is no evidence of acute pulmonary embolism. Atherosclerosis of the aorta, great  vessels and coronary arteries. There is suboptimal aortic opacification but no evidence of acute systemic arterial abnormality. There are calcifications of the aortic valve and a small amount of pericardial fluid. The heart size is normal. Mediastinum/Nodes: There are no enlarged mediastinal, hilar or axillary lymph nodes. The thyroid gland, trachea and esophagus demonstrate no significant findings. Lungs/Pleura: No pleural effusion or pneumothorax. Underlying moderate centrilobular and paraseptal emphysema with interval resolution of the previously demonstrated patchy left basilar airspace disease. There are bandlike opacities in both lungs consistent with chronic atelectasis or scarring. Stable 6 x 3 mm anterior right lower lobe nodule on image 113/6 and additional scattered tiny nodules. No new airspace disease or suspicious pulmonary nodularity. Upper abdomen:  Dictated separately. Musculoskeletal/Chest wall: There is no chest wall mass or suspicious osseous finding. Grossly stable chronic superior endplate compression deformities at T12 and L1. Review of the MIP images confirms the above findings. IMPRESSION: 1. No evidence of acute pulmonary embolism or other acute chest findings. 2. Interval resolution of previously demonstrated left basilar airspace disease. 3. Aortic Atherosclerosis (ICD10-I70.0) and Emphysema (ICD10-J43.9). Electronically Signed  By: Elsie Perone M.D.   On: 04/30/2024 10:29   DG Foot Complete Left Result Date: 04/30/2024 CLINICAL DATA:  Foot pain . EXAM: LEFT FOOT - COMPLETE 3+ VIEW COMPARISON:  04/28/2023. FINDINGS: Interval second toe amputation. Marked bony demineralization. No evidence for an acute fracture. IP joint of the great toe and PIP joint of the third toe are not well evaluated on the AP or oblique imaging, presumably positional although some cortical loss at each joint is not excluded. IMPRESSION: 1. Interval second toe amputation. 2. Marked bony demineralization. 3.  No evidence for an acute fracture. 4. IP joint of the great toe and PIP joint of the third toe are not well evaluated on the AP or oblique imaging, possibly positional although some cortical loss at each joint is suggested. Joint infection/osteomyelitis is a concern. Correlate clinically and consider foot MRI with and without contrast to further evaluate as clinically warranted. Electronically Signed   By: Camellia Candle M.D.   On: 04/30/2024 07:49   DG Chest Portable 1 View Result Date: 04/30/2024 CLINICAL DATA:  Shortness of breath. EXAM: PORTABLE CHEST 1 VIEW COMPARISON:  04/16/2024 FINDINGS: Marked rightward patient rotation. Lungs are hyperexpanded. Interstitial markings are diffusely coarsened with chronic features. Cardiopericardial silhouette is at upper limits of normal for size. Bones are diffusely demineralized. Telemetry leads overlie the chest. IMPRESSION: Hyperexpansion with chronic interstitial coarsening. No acute cardiopulmonary findings. Electronically Signed   By: Camellia Candle M.D.   On: 04/30/2024 07:44    EKG: Independently reviewed.  NSR with rate 79; nonspecific ST changes with no evidence of acute ischemia   Labs on Admission: I have personally reviewed the available labs and imaging studies at the time of the admission.  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) Active Problems:   Acute hypoxic respiratory failure (HCC)   Bipolar disorder (HCC)   Dementia with behavioral disturbance (HCC)   PAD (peripheral artery disease) (HCC)   Acute osteomyelitis of toe, left (HCC)   Gangrene of toe of left foot (HCC)   DNR (do not resuscitate)    Critical limb ischemia of LLE with necrotic lesions Previously admitted for cellulitis earlier this month 11/2023 ABIs were stable She has previously undergone stent placement of the L SFA and popliteal  artery She was scheduled for LLE angiogram on 04/24/24 but was hospitalized and it was deferred Now with necrotic-appearing lesions of the LLE Will treat with IV antibiotics (Unasyn  + Linezolid  as per the lower extremity wound algorithm) Podiatry and vascular surgery will consult MRI is ordered Patient would not agree to BKA if recommended and instead would prefer to transition to comfort care LE wound order set utilized including labs (CRP, ESR, prealbumin, and blood cultures) and consults (TOC team and nutrition)  Continue ASA  Acute hypoxic respiratory failure Unremarkable chest imaging Suspect that this is related to infection in the setting of lower extremity Continue O2 as needed for sats <92%  Chronic diastolic CHF No acute exacerbation is suspected Hold Lasix   Dementia with anxiety, h/o bipolar d/o Oriented x 2, which appears to be baseline Able to clearly express her wishes with regards to treatment at this time, which DSS guardian confirms Continue olanzapine , sertraline , lamotrigine , and trazodone  Delirium precautions  Functional decline/GOC Guardian reports ongoing decline Likely to need SNF placement vs. Hospice She is DNR, which was confirmed  at the time of admission     Advance Care Planning:   Code Status: Limited: Do not attempt resuscitation (DNR) -DNR-LIMITED -Do Not Intubate/DNI    Consults: Podiatry; vascular surgery; nutrition; TOC team  DVT Prophylaxis: Lovenox   Family Communication: None present; I spoke with her DSS guardian by telephone at the time of admission  Severity of Illness: The appropriate patient status for this patient is INPATIENT. Inpatient status is judged to be reasonable and necessary in order to provide the required intensity of service to ensure the patient's safety. The patient's presenting symptoms, physical exam findings, and initial radiographic and laboratory data in the context of their chronic comorbidities is felt to place  them at high risk for further clinical deterioration. Furthermore, it is not anticipated that the patient will be medically stable for discharge from the hospital within 2 midnights of admission.   * I certify that at the point of admission it is my clinical judgment that the patient will require inpatient hospital care spanning beyond 2 midnights from the point of admission due to high intensity of service, high risk for further deterioration and high frequency of surveillance required.*  Author: Delon Herald, MD 04/30/2024 12:44 PM  For on call review [email or web address].

## 2024-04-30 NOTE — ED Notes (Signed)
 Pt incontinent of urine. Pt cleaned and placed in fresh brief.

## 2024-04-30 NOTE — Consult Note (Signed)
 PODIATRY CONSULTATION  NAME [name] MRN [id number] DOB 1941/06/02 DOA 04/30/2024   Reason for consult:  Left foot ulcers  Attending/Consulting physician: DOROTHA Herald MD  History of present illness: [name] is a 83 y.o. female with history of sepsis with recent admission from 8/4 until 8/14 who comes in with shortness of breath.  Patient was found to have pneumonia during this hospitalization she was requiring 2 L of oxygen which was a new oxygen requirement.  She was weaned to room air prior to discharge.  Patient has known baseline dementia, anxiety.   Patient reports coming in for worsening shortness of breath.  She denies any falls or hitting her head.  She reports generalized pain all over her body but reports the pain is worse on her leg.  She reports that has gotten worse since discharge she resides at [facility].  Pt seen but she was not able to provide me any additional information regarding the left foot wounds - she has dementia. She did say the foot hurt during exam. When asked if she would be agreeable to amputation of toes on left foot if necessary she did not respond.   Past Medical History:  Diagnosis Date   Alzheimer's dementia (HCC)    Insomnia    Low back pain    Vomiting        Latest Ref Rng & Units 04/30/2024    7:12 AM 04/26/2024    4:32 AM 04/25/2024    3:39 PM  CBC  WBC 4.0 - 10.5 K/uL 11.0  7.9  8.6   Hemoglobin 12.0 - 15.0 g/dL 89.9  88.5  89.2   Hematocrit 36.0 - 46.0 % 32.6  38.8  36.9   Platelets 150 - 400 K/uL 520  587  600        Latest Ref Rng & Units 04/30/2024    7:12 AM 04/26/2024    4:32 AM 04/25/2024    3:39 PM  BMP  Glucose 70 - 99 mg/dL 889  872  882   BUN 8 - 23 mg/dL 37  38  35   Creatinine 0.44 - 1.00 mg/dL 9.35  9.40  9.27   Sodium 135 - 145 mmol/L 141  143  142   Potassium 3.5 - 5.1 mmol/L 3.5  4.4  3.8   Chloride 98 - 111 mmol/L 105  105  103   CO2 22 - 32 mmol/L 25  28  26    Calcium  8.9 - 10.3 mg/dL 9.4  89.8   89.9       Physical Exam: Lower Extremity Exam  Left foot non palpable DP and PT pulses  Necrotic ulcers to the left first ray at the medial aspect 1st met head as well as dorsal medial hallux IPJ area. Also necrotic ulcer dorsal left 3rd toe PIPJ area.   No active drainge. Erythema of the first and third toe and about the wound on the medial first ray.   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 female with PMHx significant for Dementia  sepsis with recent admission from 8/4 until 8/14 who comes in with shortness of breath, also with arterial ulceration of the first and second toes on the left foot with associated cellulitis.  Also with known PAD was planned for LLE angio recently but had to be delayed due to hospitalization for pneumonia.   WBC 11  ESR/CRP: p  XR foot L: L 2nd toe amputation at MPJ level, osseous demineralization, no fx, possible cortical loss at IPJ halulx and PIPJ of third toe concerning for possible osteomyelitis  MRI L foot: pending   - Recommend MRI to furher assess for OM L 1st and 3rd toe as well as 1st met head. May likely require 3rd toe amputation and first toe or first ray amputation this admission pending results. - Appreciate vascular surgery recs, may require angio LLE for optimization prior to amputation.  - Continue IV abx broad spectrum pending further culture data - Anticoagulation: ok to continue per primary / vascular recs - Wound care: Recommend betadine wet to dry dressing to left foot change every other day. - WB status: WBAT LLE pre op - Will continue to follow   Thank you for the consult.  Please contact me directly with any questions or concerns.           [name], DPM [facility] / [facility]    [address] [place], [place] [address]                Office [phone]  Fax [phone]

## 2024-04-30 NOTE — ED Notes (Signed)
 Pt noted to have consistent artifact on EKG leads. This RN at bedside and asked pt if I could adjust pt in bed or adjust pts EKG leads. Pt states No they are fine. Don't touch them.

## 2024-04-30 NOTE — ED Provider Notes (Signed)
 [facility] Provider Note    Event Date/Time   First MD Initiated Contact with Patient 04/30/24 [phone]     (approximate)   History   Shortness of Breath   HPI  [name] is a 84 y.o. female with history of sepsis with recent admission from 8/4 until 8/14 who comes in with shortness of breath.  Patient was found to have pneumonia during this hospitalization she was requiring 2 L of oxygen which was a new oxygen requirement.  She was weaned to room air prior to discharge.  Patient has known baseline dementia, anxiety.  Patient reports coming in for worsening shortness of breath.  She denies any falls or hitting her head.  She reports generalized pain all over her body but reports the pain is worse on her leg.  She reports that has gotten worse since discharge she resides at [facility].  Patient was discharged to [facility] house.  Physical Exam   Triage Vital Signs: ED Triage Vitals  Encounter Vitals Group     BP 04/30/24 0654 (!) 119/56     Girls Systolic BP Percentile --      Girls Diastolic BP Percentile --      Boys Systolic BP Percentile --      Boys Diastolic BP Percentile --      Pulse Rate 04/30/24 0654 86     Resp 04/30/24 0654 20     Temp 04/30/24 0654 98.7 F (37.1 C)     Temp Source 04/30/24 0654 Oral     SpO2 04/30/24 0654 98 %     Weight 04/30/24 0653 107 lb 2.3 oz (48.6 kg)     Height 04/30/24 0653 5' 7 (1.702 m)     Head Circumference --      Peak Flow --      Pain Score 04/30/24 0653 0     Pain Loc --      Pain Education --      Exclude from Growth Chart --     Most recent vital signs: Vitals:   04/30/24 0654  BP: (!) 119/56  Pulse: 86  Resp: 20  Temp: 98.7 F (37.1 C)  SpO2: 98%     General: Awake, no distress.  CV:  Good peripheral perfusion.  Resp:  Normal effort.  On 2 L due to hypoxia down to 87% Abd:  No distention.  Reports some tenderness with palpation Other:  Patient has redness noted to the left  leg.  She has got a warm well-perfused foot however she has got areas of necrotic skin noted on the foot.  See in the media tab.  Able to flex and extend the ankle without any significant pain.   ED Results / Procedures / Treatments   Labs (all labs ordered are listed, but only abnormal results are displayed) Labs Reviewed  RESP PANEL BY RT-PCR (RSV, FLU A&B, COVID)  [id number]  COMPREHENSIVE METABOLIC PANEL WITH GFR  LIPASE, BLOOD  CBC WITH DIFFERENTIAL/PLATELET  BRAIN NATRIURETIC PEPTIDE  TROPONIN I (HIGH SENSITIVITY)     EKG  My interpretation of EKG:  Sinus rate of 79 without any ST elevation or T wave inversions, normal intervals  RADIOLOGY I have reviewed the xray personally and interpreted rotation noted but no obvious signs of pneumonia   PROCEDURES:  Critical Care performed: Yes, see critical care procedure note(s)  .1-3 Lead EKG Interpretation  Performed by: Ernest Ronal BRAVO, MD Authorized by: Ernest Ronal BRAVO, MD  Interpretation: normal     ECG rate:  80   ECG rate assessment: normal     Rhythm: sinus rhythm     Ectopy: none     Conduction: normal   .Critical Care  Performed by: Ernest Ronal BRAVO, MD Authorized by: Ernest Ronal BRAVO, MD   Critical care provider statement:    Critical care time (minutes):  30   Critical care was necessary to treat or prevent imminent or life-threatening deterioration of the following conditions:  Respiratory failure   Critical care was time spent personally by me on the following activities:  Development of treatment plan with patient or surrogate, discussions with consultants, evaluation of patient's response to treatment, examination of patient, ordering and review of laboratory studies, ordering and review of radiographic studies, ordering and performing treatments and interventions, pulse oximetry, re-evaluation of patient's condition and review of old charts    MEDICATIONS ORDERED IN ED: Medications  vancomycin  (VANCOCIN ) IVPB  1000 mg/200 mL premix (1,000 mg Intravenous New Bag/Given 04/30/24 1046)  iohexol  (OMNIPAQUE ) 350 MG/ML injection 100 mL (100 mLs Intravenous Contrast Given 04/30/24 0922)  piperacillin -tazobactam (ZOSYN ) IVPB 3.375 g (0 g Intravenous Stopped 04/30/24 1046)     IMPRESSION / MDM / ASSESSMENT AND PLAN / ED COURSE  I reviewed the triage vital signs and the nursing notes.   Patient's presentation is most consistent with acute presentation with potential threat to life or bodily function.   Patient comes in with concerns for worsening shortness of breath.  She was hypoxic and placed on 2 L of nasal cannula.  She does have significant wounds noted to her left leg with some areas of necrotic skin.  She reports that is worse than prior.  She does not meet any sepsis criteria therefore blood cultures, lactate were not ordered.  Will do workup to evaluate for pneumonia, COVID, flu, ACS.  Consider the possibility of pulmonary embolism given recent hospitalization.  She denies any falls or hitting her head and had CT imaging of the head done on 8/4 that was reassuring  BNP is normal CMP reassuring lipase normal CBC shows elevated white count.  COVID, flu are negative troponin is negative  Patient CT imaging overall reassuring.  X-ray was concerning for possible osteo did discuss case with podiatry Dr. Malvin who will come evaluate patient did recommend an MRI which I have ordered.  I have also discussed case with vascular given they are planning to do intervention outpatient but was delayed secondary to prior admission.  Given her wounds are looking worse we will discuss the hospitalist for admission we will cover with bank, Zosyn .  Holding off on blood cultures because does not meet sepsis criteria.  Patient is able to flex and extend the ankle no evidence of septic joint.  The patient is on the cardiac monitor to evaluate for evidence of arrhythmia and/or significant heart rate changes.      FINAL  CLINICAL IMPRESSION(S) / ED DIAGNOSES   Final diagnoses:  Foot infection  Cellulitis of left lower extremity     Rx / DC Orders   ED Discharge Orders     None        Note:  This document was prepared using Dragon voice recognition software and may include unintentional dictation errors.   Ernest Ronal BRAVO, MD 04/30/24 1050

## 2024-04-30 NOTE — Consult Note (Signed)
 Hospital Consult    Reason for Consult:  Left Lower Extremity Sores Requesting Physician:  Dr Ronal Lewandowsky MD  MRN #:  [id number]  History of Present Illness: This is a 83 y.o. female with history of sepsis with recent admission from 8/4 until 8/14 who comes in with shortness of breath.  Patient was found to have pneumonia during this hospitalization she was requiring 2 L of oxygen which was a new oxygen requirement.  She was weaned to room air prior to discharge.  Patient has known baseline dementia, anxiety.   Patient reports coming in for worsening shortness of breath.  She denies any falls or hitting her head.  She reports generalized pain all over her body but reports the pain is worse on her leg.  She reports that has gotten worse since discharge she resides at [facility]. Patient was treated for cellulitis of her left lower extremity during her last hospitalization. Patient was scheduled for a left lower extremity angiogram on 04/24/24 but was hospitalized at that time. She returns today and Vascular Surgery was consulted to evaluate her left lower extremity.   Past Medical History:  Diagnosis Date   Alzheimer's dementia (HCC)    Insomnia    Low back pain    Vomiting     Past Surgical History:  Procedure Laterality Date   AMPUTATION TOE Left 05/06/2023   Procedure: AMPUTATION TOE 2nd toe;  Surgeon: Silva Juliene SAUNDERS, DPM;  Location: [facility] ORS;  Service: Orthopedics/Podiatry;  Laterality: Left;   HIP ARTHROPLASTY Left 09/22/2019   Procedure: ARTHROPLASTY BIPOLAR HIP (HEMIARTHROPLASTY) RIGHT;  Surgeon: Edie Norleen PARAS, MD;  Location: [facility] ORS;  Service: Orthopedics;  Laterality: Left;   LOWER EXTREMITY INTERVENTION Left 05/04/2023   Procedure: LOWER EXTREMITY INTERVENTION;  Surgeon: Jama Cordella MATSU, MD;  Location: [facility] INVASIVE CV LAB;  Service: Cardiovascular;  Laterality: Left;   VAGINAL HYSTERECTOMY      No Known Allergies  Prior to Admission medications   Medication Sig Start  Date End Date Taking? Authorizing Provider  acetaminophen  (TYLENOL ) 325 MG tablet Take 650 mg by mouth 3 (three) times daily. 03/02/24   [provider]  ascorbic acid  (VITAMIN C ) 250 MG tablet Take 1 tablet (250 mg total) by mouth 2 (two) times daily. 04/23/24   Jhonny Calvin NOVAK, MD  aspirin  EC 81 MG tablet Take 1 tablet (81 mg total) by mouth daily. Swallow whole. 05/11/23   Fausto Burnard LABOR, DO  cholecalciferol  (VITAMIN D3) 25 MCG (1000 UT) tablet Take 1,000 Units by mouth daily.    [provider]  feeding supplement, ENSURE ENLIVE, (ENSURE ENLIVE) LIQD Take 237 mLs by mouth 2 (two) times daily between meals. 09/27/19   Tobie Yetta HERO, MD  ferrous sulfate  325 (65 FE) MG tablet Take 1 tablet (325 mg total) by mouth daily with breakfast. 05/11/23   Fausto Burnard A, DO  furosemide  (LASIX ) 20 MG tablet Take 20 mg by mouth daily. 03/12/24   [provider]  lamoTRIgine  (LAMICTAL ) 25 MG tablet Take 25 mg by mouth 2 (two) times daily.    [provider]  magnesium  oxide (MAG-OX) 400 MG tablet Take 400 mg by mouth daily.    [provider]  melatonin 5 MG TABS Take 5 mg by mouth at bedtime.    [provider]  OLANZapine  (ZYPREXA ) 5 MG tablet Take 5 mg by mouth at bedtime.    [provider]  omeprazole (PRILOSEC) 20 MG capsule Take 20 mg by mouth daily.  11/09/23   [provider]  polyethylene glycol (MIRALAX  / GLYCOLAX ) 17 g packet Take 34 g by mouth daily. 05/11/23   Fausto Burnard LABOR, DO  senna-docusate (SENOKOT-S) 8.6-50 MG tablet Take 2 tablets by mouth 2 (two) times daily.    [provider]  sertraline  (ZOLOFT ) 100 MG tablet Take 100 mg by mouth daily. 02/21/23   [provider]  traZODone  (DESYREL ) 100 MG tablet Take 100 mg by mouth at bedtime. 02/21/23   [provider]    Social History   Socioeconomic History   Marital status: Divorced    Spouse name: Not on file   Number of children: Not  on file   Years of education: Not on file   Highest education level: Not on file  Occupational History   Not on file  Tobacco Use   Smoking status: Former   Smokeless tobacco: Never  Substance and Sexual Activity   Alcohol use: Not Currently   Drug use: Not Currently   Sexual activity: Not on file  Other Topics Concern   Not on file  Social History Narrative   Not on file   Social Drivers of Health   Financial Resource Strain: Low Risk  (09/22/2023)   Received from [facility]   Overall Financial Resource Strain (CARDIA)    Difficulty of Paying Living Expenses: Not hard at all  Food Insecurity: No Food Insecurity (04/16/2024)   Hunger Vital Sign    Worried About Running Out of Food in the Last Year: Never true    Ran Out of Food in the Last Year: Never true  Transportation Needs: No Transportation Needs (04/16/2024)   PRAPARE - Administrator, Civil Service (Medical): No    Lack of Transportation (Non-Medical): No  Physical Activity: Not on file  Stress: Not on file  Social Connections: Patient Unable To Answer (04/16/2024)   Social Connection and Isolation Panel    Frequency of Communication with Friends and Family: Patient unable to answer    Frequency of Social Gatherings with Friends and Family: Patient unable to answer    Attends Religious Services: Patient unable to answer    Active Member of Clubs or Organizations: Patient unable to answer    Attends Banker Meetings: Patient unable to answer    Marital Status: Patient unable to answer  Intimate Partner Violence: Patient Unable To Answer (04/16/2024)   Humiliation, Afraid, Rape, and Kick questionnaire    Fear of Current or Ex-Partner: Patient unable to answer    Emotionally Abused: Patient unable to answer    Physically Abused: Patient unable to answer    Sexually Abused: Patient unable to answer     No family history on file.  ROS: Otherwise negative unless mentioned in  HPI  Physical Examination  Vitals:   04/30/24 0730 04/30/24 0800  BP: (!) 113/48 101/80  Pulse: 74 80  Resp: 20 16  Temp:    SpO2: 98% 100%   Body mass index is 16.78 kg/m.  General:  WDWN in NAD Gait: Not observed HENT: WNL, normocephalic Pulmonary: normal non-labored breathing, without Rales, rhonchi,  wheezing Cardiac: {Desc; regular/irreg:14544}, without  Murmurs, rubs or gallops; {With/Without:20273} carotid bruits Abdomen: *** soft, NT/ND, no masses Skin: {With/Without:20273} rashes Vascular Exam/Pulses: *** Extremities: {With/Without:20273} ischemic changes, {With/Without:20273} Gangrene , {With/Without:20273} cellulitis; {With/Without:20273} open wounds;  Musculoskeletal: no muscle wasting or atrophy  Neurologic: A&O X 3;  No focal weakness or paresthesias are detected; speech is fluent/normal Psychiatric:  The pt has {Desc; normal/abnormal:11317::Normal} affect. Lymph:  Unremarkable  CBC    Component Value Date/Time   WBC 11.0 (H) 04/30/2024 0712   RBC 3.91 04/30/2024 0712   HGB 10.0 (L) 04/30/2024 0712   HGB 14.5 10/22/2012 0051   HCT 32.6 (L) 04/30/2024 0712   HCT 44.4 10/22/2012 0051   PLT 520 (H) 04/30/2024 0712   PLT 255 10/22/2012 0051   MCV 83.4 04/30/2024 0712   MCV 95 10/22/2012 0051   MCH 25.6 (L) 04/30/2024 0712   MCHC 30.7 04/30/2024 0712   RDW 17.7 (H) 04/30/2024 0712   RDW 13.3 10/22/2012 0051   LYMPHSABS 2.1 04/30/2024 0712   MONOABS 1.0 04/30/2024 0712   EOSABS 0.6 (H) 04/30/2024 0712   BASOSABS 0.0 04/30/2024 0712    BMET    Component Value Date/Time   NA 141 04/30/2024 0712   NA 143 10/22/2012 0051   K 3.5 04/30/2024 0712   K 3.7 10/22/2012 0051   CL 105 04/30/2024 0712   CL 109 (H) 10/22/2012 0051   CO2 25 04/30/2024 0712   CO2 26 10/22/2012 0051   GLUCOSE 110 (H) 04/30/2024 0712   GLUCOSE 129 (H) 10/22/2012 0051   BUN 37 (H) 04/30/2024 0712   BUN 33 (H) 10/22/2012 0051   CREATININE 0.64 04/30/2024 0712   CREATININE  0.66 10/22/2012 0051   CALCIUM  9.4 04/30/2024 0712   CALCIUM  9.7 10/22/2012 0051   GFRNONAA >60 04/30/2024 0712   GFRNONAA >60 10/22/2012 0051   GFRAA >60 02/29/2020 1134   GFRAA >60 10/22/2012 0051    COAGS: Lab Results  Component Value Date   INR 1.0 04/16/2024     Non-Invasive Vascular Imaging:   MRI ordered   Statin:  No. Beta Blocker:  No. Aspirin :  Yes.   ACEI:  No. ARB:  No. CCB use:  No Other antiplatelets/anticoagulants:  No.    ASSESSMENT/PLAN: This is a 83 y.o. female presents to [facility] emergency department with concerns for worsening of shortness of breath.  She was hypoxic and placed on 3 L nasal cannula upon arrival.  She was also noted to have left leg pain.  She was recently hospitalized for cellulitis of the left lower extremity which now has necrotic skin which she did not have prior.   -***   Gwendlyn JONELLE Shank [facility] 04/30/2024 10:25 AM

## 2024-04-30 NOTE — Consult Note (Signed)
 ED Pharmacy Antibiotic Sign Off An antibiotic consult was received from an ED provider for Vancomycin  per pharmacy dosing for wound infection. A chart review was completed to assess appropriateness.   The following one time order(s) were placed:  Vancomycin  1g IV.  Further antibiotic and/or antibiotic pharmacy consults should be ordered by the admitting provider if indicated.   Thank you for allowing pharmacy to be a part of this patient's care.   Riyana Biel A Ranetta Armacost, [facility]  Clinical Pharmacist 04/30/24 10:41 AM

## 2024-05-01 ENCOUNTER — Encounter: Payer: Self-pay | Admitting: Internal Medicine

## 2024-05-01 DIAGNOSIS — I70222 Atherosclerosis of native arteries of extremities with rest pain, left leg: Secondary | ICD-10-CM

## 2024-05-01 LAB — BASIC METABOLIC PANEL WITH GFR
Anion gap: 7 (ref 5–15)
BUN: 27 mg/dL — ABNORMAL HIGH (ref 8–23)
CO2: 26 mmol/L (ref 22–32)
Calcium: 9.6 mg/dL (ref 8.9–10.3)
Chloride: 108 mmol/L (ref 98–111)
Creatinine, Ser: 0.56 mg/dL (ref 0.44–1.00)
GFR, Estimated: >60 mL/min (ref >60)
Glucose, Bld: 85 mg/dL (ref 70–99)
Potassium: 3.6 mmol/L (ref 3.5–5.1)
Sodium: 141 mmol/L (ref 135–145)

## 2024-05-01 LAB — CBC
HCT: 32.3 % — ABNORMAL LOW (ref 36.0–46.0)
Hemoglobin: 9.3 g/dL — ABNORMAL LOW (ref 12.0–15.0)
MCH: 25 pg — ABNORMAL LOW (ref 26.0–34.0)
MCHC: 28.8 g/dL — ABNORMAL LOW (ref 30.0–36.0)
MCV: 86.8 fL (ref 80.0–100.0)
Platelets: 451 K/uL — ABNORMAL HIGH (ref 150–400)
RBC: 3.72 MIL/uL — ABNORMAL LOW (ref 3.87–5.11)
RDW: 17.7 % — ABNORMAL HIGH (ref 11.5–15.5)
WBC: 7.7 K/uL (ref 4.0–10.5)
nRBC: 0 % (ref 0.0–0.2)

## 2024-05-01 LAB — C-REACTIVE PROTEIN: CRP: 5.2 mg/dL — ABNORMAL HIGH (ref <1.0)

## 2024-05-01 LAB — PREALBUMIN: Prealbumin: 16 mg/dL — ABNORMAL LOW (ref 18–38)

## 2024-05-01 LAB — SEDIMENTATION RATE: Sed Rate: 60 mm/h — ABNORMAL HIGH (ref 0–30)

## 2024-05-01 MED ORDER — ADULT MULTIVITAMIN W/MINERALS CH
1.0000 | ORAL_TABLET | Freq: Every day | ORAL | Status: DC
  Administered 2024-05-04 – 2024-05-06 (×3): 1 via ORAL
  Filled 2024-05-01 (×6): qty 1

## 2024-05-01 MED ORDER — VANCOMYCIN HCL 1000 MG/200ML IV SOLN
1000.0000 mg | INTRAVENOUS | Status: DC
  Administered 2024-05-01: 1000 mg via INTRAVENOUS
  Filled 2024-05-01: qty 200

## 2024-05-01 MED ORDER — VITAMIN C 500 MG PO TABS
250.0000 mg | ORAL_TABLET | Freq: Two times a day (BID) | ORAL | Status: DC
  Administered 2024-05-02 – 2024-05-04 (×2): 250 mg via ORAL
  Filled 2024-05-01 (×5): qty 1

## 2024-05-01 MED ORDER — ENSURE PLUS HIGH PROTEIN PO LIQD
237.0000 mL | Freq: Three times a day (TID) | ORAL | Status: DC
  Administered 2024-05-04 – 2024-05-08 (×8): 237 mL via ORAL

## 2024-05-01 NOTE — Progress Notes (Addendum)
 Progress Note   Patient: [name] FMW:[id number] DOB: July 02, 1941 DOA: 04/30/2024     1 DOS: the patient was seen and examined on 05/01/2024   Brief hospital course: 83 y.o. female with medical history significant of dementia who presented from [facility] on  8/18 with SOB after recent admission for sepsis associated with LLE cellulitis and ?PNA. She was noted to have necrotic foot wounds.  She was planned for angiography but she is currently declining amputation and so ongoing GOC conversation is important.  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 Previously admitted for cellulitis earlier this month 11/2023 ABIs were stable She has previously undergone stent placement of the L SFA and popliteal artery She was scheduled for LLE angiogram on 04/24/24 but was hospitalized and it was deferred Now with necrotic-appearing lesions of the LLE Will treat with IV antibiotics (Unasyn  + Linezolid  as per the lower extremity wound algorithm) Podiatry and vascular surgery are consulting MRI with apparent osteo In discussion with vascular surgery, she is likely to need AKA  Patient would not agree to BKA if recommended and instead would prefer to transition to comfort care Continue ASA I started these conversations with patient and her guardian yesterday She is complaining of pain; will escalate her pain control for now while awaiting a definitive answer about transition to comfort She is likely to be a candidate for residential hospice, as she is at high risk for mortality in this situation   Acute hypoxic respiratory failure Unremarkable chest imaging Suspect that this is related to infection in the setting of lower extremity Continue O2 as needed for sats <92%   Chronic diastolic CHF No acute exacerbation is suspected Hold Lasix    Dementia with anxiety, h/o bipolar d/o Oriented x 2, which appears to be baseline Able to clearly express her wishes with  regards to treatment at this time, which DSS guardian confirms Continue olanzapine , sertraline , lamotrigine , and trazodone  Delirium precautions   Functional decline/GOC Guardian reports ongoing decline Likely to need SNF placement vs. Hospice She is DNR, which was confirmed at the time of admission      Subjective: Again she is able to state that she does not want amputation and would prefer to go to sleep.  Physical Exam: Vitals:   05/01/24 0530 05/01/24 0600 05/01/24 0630 05/01/24 0700  BP: (!) 106/48 (!) 99/53 (!) 96/53 (!) 100/44  Pulse: 66 73 69 66  Resp: (!) 22 18 16 18   Temp:      TempSrc:      SpO2: 99% 98% 98% 98%  Weight:      Height:        Intake/Output Summary (Last 24 hours) at 05/01/2024 0902 Last data filed at 04/30/2024 1148 Gross per 24 hour  Intake 300 ml  Output --  Net 300 ml   Filed Weights   04/30/24 0653  Weight: 48.6 kg    Exam:  General:  Appears calm and comfortable and is in NAD Eyes:   normal lids, iris ENT:  grossly normal hearing, lips & tongue, mmm Cardiovascular:  RRR Respiratory:   CTA bilaterally with no wheezes/rales/rhonchi.  Normal respiratory effort. Abdomen:  soft, NT, ND Skin:  marked stasis dermatitis with ulceration of RLE with scattered necrotic-appearing lesions including R anteromedial ankle, 1st and 3rd toes, and L medial MTP region         Musculoskeletal:  as above Psychiatric:  blunted mood and affect, speech sparse but appropriate, AOx2  but able to provide consent regarding NOT desiring BKA/AKA Neurologic:  CN 2-12 grossly intact, moves all extremities in coordinated fashion   Data Reviewed: I have reviewed the patient's lab results since admission.  Pertinent labs for today include:  Unremarkable BMP WBC 7.7 Hgb 9.3 Platelets 415    Family Communication: None present; I spoke with legal guardian and they are considering the best course of action [phone])  Disposition: Status is:  Inpatient Remains inpatient appropriate because: needs ongoing GOC discussion  Planned Discharge Destination: hospice    Time spent: 50 minutes  Author: Delon Herald, MD 05/01/2024 9:00 AM  For on call review [email or web address].

## 2024-05-01 NOTE — ED Notes (Signed)
 Patient bed was ready at 10:34am. Alerted Nurse Delon Side at that time.

## 2024-05-01 NOTE — ED Notes (Signed)
 This RN in pts room and assisted with placing pt in a gown. Pt also readjusted in the bed. New chux pads and brief placed on pt. Pt cleaned up with bath wipes. Pt noted to have sacral dressing to buttocks. This RN placed pillows underneath pts right hip and right side of her head. Blanket placed in between pts knees for comfort.

## 2024-05-01 NOTE — Progress Notes (Signed)
 Nutrition Follow Up Note   DOCUMENTATION CODES:   Severe malnutrition in context of social or environmental circumstances  INTERVENTION:   Ensure Plus High Protein po TID, each supplement provides 350 kcal and 20 grams of protein  Magic cup TID with meals, each supplement provides 290 kcal and 9 grams of protein  MVI po daily   Vitamin C  250mg  po BID   Liberal diet   Assist with meals  Pt  at refeed risk; recommend checking potassium, magnesium  and phosphorus labs.   Daily weights   NUTRITION DIAGNOSIS:   Severe Malnutrition related to social / environmental circumstances as evidenced by severe muscle depletion, severe fat depletion.  GOAL:   Patient will meet greater than or equal to 90% of their needs  MONITOR:   PO intake, Supplement acceptance, Labs, Weight trends, I & O's, Skin  ASSESSMENT:   83 y/o female with h/o bipolar disorder, dementia, anxiety, PAD, HLD, GERD and recent admission for PNA, left lower extremity cellulitis and sepsis and who is now admitted with critical limb ischemia of LLE with necrotic lesions.  Met with pt in room today. Pt is well known to this RD from a recent previous admission. Pt laying in bed today and reports that she feels terrible. Pt looks significantly more weak and debilitated from her previous admission. Pt's lunch tray is sitting on her side table untouched. Pt reports that she is not hungry right now. Pt enjoys chocolate Ensure. RD will add supplements and vitamins to help pt meet her estimated needs. Pt is at high refeeed risk. Per chart, pt appears weight stable from her last admission. RD will monitor for GOC.   Medications reviewed and include: vitamin C , aspirin , colace, lovenox , ferrous sulfate , Mg oxide, melatonin, MVI, protonix , miralax , senokot, unasyn , vancomycin    Labs reviewed: K 3.6 wnl, BUN 27(H) Hgb 9.3(L), Hct 32.3(L), MCH 25.0(L), MCHC 28.8(L)  Diet Order:   Diet Order             Diet regular Room  service appropriate? No; Fluid consistency: Thin  Diet effective now                  EDUCATION NEEDS:   Not appropriate for education at this time  Skin:  Skin Assessment: Reviewed RN Assessment (lower extremities wounds on L leg and foot: peripheral vascular disease cellulitis, great toe, metatarsal head and 2nd toe)  Last BM:  pta  Height:   Ht Readings from Last 1 Encounters:  04/30/24 5' 7 (1.702 m)    Weight:   Wt Readings from Last 1 Encounters:  04/30/24 48.6 kg   BMI:  Body mass index is 16.78 kg/m.  Estimated Nutritional Needs:   Kcal:  1400-1600kcal/day  Protein:  70-80g/day  Fluid:  1.4-1.6L/day  Augustin Shams MS, RD, LDN If unable to be reached, please send secure chat to RD inpatient available from 8:00a-4:00p daily

## 2024-05-02 DIAGNOSIS — M86172 Other acute osteomyelitis, left ankle and foot: Secondary | ICD-10-CM | POA: Diagnosis not present

## 2024-05-02 DIAGNOSIS — I70222 Atherosclerosis of native arteries of extremities with rest pain, left leg: Secondary | ICD-10-CM | POA: Diagnosis not present

## 2024-05-02 DIAGNOSIS — J9601 Acute respiratory failure with hypoxia: Secondary | ICD-10-CM

## 2024-05-02 LAB — MAGNESIUM: Magnesium: 1.5 mg/dL — ABNORMAL LOW (ref 1.7–2.4)

## 2024-05-02 LAB — BASIC METABOLIC PANEL WITH GFR
Anion gap: 8 (ref 5–15)
BUN: 22 mg/dL (ref 8–23)
CO2: 26 mmol/L (ref 22–32)
Calcium: 9.4 mg/dL (ref 8.9–10.3)
Chloride: 107 mmol/L (ref 98–111)
Creatinine, Ser: 0.46 mg/dL (ref 0.44–1.00)
GFR, Estimated: >60 mL/min (ref >60)
Glucose, Bld: 87 mg/dL (ref 70–99)
Potassium: 3.8 mmol/L (ref 3.5–5.1)
Sodium: 141 mmol/L (ref 135–145)

## 2024-05-02 LAB — PHOSPHORUS: Phosphorus: 2.7 mg/dL (ref 2.5–4.6)

## 2024-05-02 MED ORDER — VANCOMYCIN HCL 750 MG/150ML IV SOLN
750.0000 mg | INTRAVENOUS | Status: DC
  Administered 2024-05-02 – 2024-05-03 (×2): 750 mg via INTRAVENOUS
  Filled 2024-05-02 (×2): qty 150

## 2024-05-02 MED ORDER — MAGNESIUM SULFATE 4 GM/100ML IV SOLN
4.0000 g | Freq: Once | INTRAVENOUS | Status: AC
  Administered 2024-05-02: 4 g via INTRAVENOUS
  Filled 2024-05-02: qty 100

## 2024-05-02 NOTE — Consult Note (Signed)
 Pharmacy Antibiotic Note  [name] is a 83 y.o. female admitted on 04/30/2024 with shortness of breath.  Patient had recent admission for sepsis associated with LLE cellulitis and pneumonia. Pharmacy has been consulted for Vancomycin  dosing. Patient is also receiving Unasyn .  Plan: Adjust vancomycin  to 750 mg IV Q24H. Goal AUC 400-550. Expected AUC: 463.9 Expected Css min: 11.8 SCr used: 0.8  Weight used: TBW<IBW, Vd used: 0.72 (BMI 18.68) Patient is also on Unasyn  3 g IV Q6H Continue to monitor renal function and follow culture results  Height: 5' 7 (170.2 cm) Weight: 54.1 kg (119 lb 4.3 oz) IBW/kg (Calculated) : 61.6  Temp (24hrs), Avg:98.2 F (36.8 C), Min:97.8 F (36.6 C), Max:98.5 F (36.9 C)  Recent Labs  Lab 04/25/24 1539 04/26/24 0432 04/30/24 0712 05/01/24 0502 05/02/24 0556  WBC 8.6 7.9 11.0* 7.7  --   CREATININE 0.72 0.59 0.64 0.56 0.46    Estimated Creatinine Clearance: 44.7 mL/min (by C-G formula based on SCr of 0.46 mg/dL).    No Known Allergies  Antimicrobials this admission: Vancomycin  8/18 >>  Unasyn  8/18 >>  Zosyn  8/18 x1  Dose adjustments this admission: N/A  Microbiology results: 8/18 BCx: IP   Thank you for allowing pharmacy to be a part of this patient's care.  Lum VEAR Mania, PharmD, BCPS 05/02/2024 11:30 AM

## 2024-05-02 NOTE — Care Management Important Message (Signed)
 Important Message  Patient Details  Name: [name] MRN: [id number] Date of Birth: 08/08/1941   Important Message Given:  Yes - Medicare IM     [name] 05/02/2024, 12:33 PM

## 2024-05-02 NOTE — Hospital Course (Signed)
 83 y.o. female with medical history significant of dementia who presented from [facility] on  8/18 with SOB after recent admission for sepsis associated with LLE cellulitis and ?PNA. She was noted to have necrotic foot wounds.  She was planned for angiography but she is currently declining amputation. This admission to the hospital, patient has been refusing taking any medications, refused to eat, she also refused amputation.  She wished to die. [name] from DSS has called me, approved comfort care and hospice.

## 2024-05-02 NOTE — TOC Initial Note (Signed)
 Transition of Care [facility]) - Initial/Assessment Note    Patient Details  Name: [name] MRN: [id number] Date of Birth: 1941-07-09  Transition of Care [facility]) CM/SW Contact:    Corean ONEIDA Haddock, RN Phone Number: 05/02/2024, 4:57 PM  Clinical Narrative:                    Patient admitted from [facility].  Per MD patient appropriate for comfort care.  Call placed to [facility] with [place] DSS.  I have provided her with the MD contact number per MD request.  She states that she will have to get approval from her supervisor for comfort care      Patient Goals and CMS Choice            Expected Discharge Plan and Services                                              Prior Living Arrangements/Services                       Activities of Daily Living   ADL Screening (condition at time of admission) Independently performs ADLs?: No Does the patient have a NEW difficulty with bathing/dressing/toileting/self-feeding that is expected to last >3 days?: No Does the patient have a NEW difficulty with getting in/out of bed, walking, or climbing stairs that is expected to last >3 days?: No Does the patient have a NEW difficulty with communication that is expected to last >3 days?: No Is the patient deaf or have difficulty hearing?: No Does the patient have difficulty seeing, even when wearing glasses/contacts?: No Does the patient have difficulty concentrating, remembering, or making decisions?: Yes  Permission Sought/Granted                  Emotional Assessment              Admission diagnosis:  Foot infection [L08.9] Cellulitis of left lower extremity [L03.116] Critical limb ischemia of left lower extremity (HCC) [I70.222] Patient Active Problem List   Diagnosis Date Noted   Hypomagnesemia 05/02/2024   Critical limb ischemia of left lower extremity (HCC) 04/30/2024   Acute osteomyelitis of toe, left (HCC) 04/30/2024   Gangrene of  toe of left foot (HCC) 04/30/2024   DNR (do not resuscitate) 04/30/2024   Protein-calorie malnutrition, severe 04/18/2024   Sepsis (HCC) 04/16/2024   Atherosclerosis of native arteries of the extremities with ulceration (HCC) 03/11/2024   Atherosclerosis of 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 (HCC) 05/24/2023   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) 04/29/2023   Cellulitis 04/28/2023   HCAP (healthcare-associated pneumonia) 12/19/2020   PAD (peripheral artery disease) (HCC) 12/19/2020   HLD (hyperlipidemia) 12/19/2020   Insomnia 12/19/2020   GERD (gastroesophageal reflux disease) 12/19/2020   Bipolar disorder (HCC) 12/15/2020   Dementia with behavioral disturbance (HCC) 12/15/2020   SOB (shortness of breath) 12/12/2020   Acute hypoxic respiratory failure (HCC) 12/12/2020   Community acquired pneumonia 12/12/2020   Pressure injury of skin 09/25/2019   Fall    Closed left subtrochanteric femur fracture (HCC) 09/21/2019   PCP:  Merilee Browning, CRNA Pharmacy:   [facility] - [address] [place], [place] - [address] [place] [address] Phone: [phone] Fax: [phone]     Social Drivers of Health (SDOH)  Social History: SDOH Screenings   Food Insecurity: No Food Insecurity (05/01/2024)  Housing: Low Risk  (05/01/2024)  Transportation Needs: No Transportation Needs (05/01/2024)  Utilities: Not At Risk (05/01/2024)  Financial Resource Strain: Low Risk  (09/22/2023)   Received from [facility]  Social Connections: Unknown (05/01/2024)  Tobacco Use: Medium Risk (05/01/2024)   SDOH Interventions:     Readmission Risk Interventions     No data to display

## 2024-05-02 NOTE — Progress Notes (Addendum)
 Progress Note   Patient: [name] FMW:[id number] DOB: [birth year] DOA: 04/30/2024     2 DOS: the patient was seen and examined on 05/02/2024   Brief hospital course: 83 y.o. female with medical history significant of dementia who presented from [facility] on  8/18 with SOB after recent admission for sepsis associated with LLE cellulitis and ?PNA. She was noted to have necrotic foot wounds.  She was planned for angiography but she is currently declining amputation. This admission to the hospital, patient has been refusing taking any medications, refused to eat, she also refused amputation.  She wished to die. Discussion with DSS, legal guardian, is ongoing.   Principal Problem:   Critical limb ischemia of left lower extremity (HCC) Active Problems:   Acute hypoxic respiratory failure (HCC)   Bipolar disorder (HCC)   Dementia with behavioral disturbance (HCC)   PAD (peripheral artery disease) (HCC)   Acute osteomyelitis of toe, left (HCC)   Gangrene of toe of left foot (HCC)   DNR (do not resuscitate)   Hypomagnesemia   Assessment and Plan: Critical limb ischemia of LLE with necrotic lesions Left anteromedial ankle, left 1st and 3rd toes, and L medial MTP region necrotic ulcers  Left foot osteomyelitis. Previously admitted for cellulitis earlier this month 11/2023 ABIs were stable She has previously undergone stent placement of the L SFA and popliteal artery She was scheduled for LLE angiogram on 04/24/24 but was hospitalized and it was deferred Now with necrotic-appearing lesions of the LLE Will treat with IV antibiotics (Unasyn  + Linezolid  as per the lower extremity wound algorithm) Podiatry and vascular surgery are consulting MRI with apparent osteo In discussion with vascular surgery, she is likely to need AKA   Patient so far has been refusing all treatment, refusing surgery.  Without amputation, patient condition is futile.  DSS is patient's legal guardian, TOC and I  both called the DSS, could not go through.  My cell phone number has a left to them, hopefully they can call us  back.  Decision need to be made to transition patient to comfort care.   Acute hypoxic respiratory failure Appears to be improving   Chronic diastolic CHF Exacerbation   Advanced dementia with anxiety, h/o bipolar d/o Functional decline. Severe protein calorie malnutrition. Patient currently is only oriented to herself.  Clearly, she has been refusing every treatment.  Due to poor prognosis, continue discussion with the DSS for possible comfort care.   Hypomagnesemia. Patient receiving IV mag sulfate       Subjective:  Patient has been refusing food and medicines.  Physical Exam: Vitals:   05/01/24 1938 05/02/24 0500 05/02/24 0531 05/02/24 0915  BP: (!) 97/41  (!) 109/49 (!) 109/47  Pulse: 65  71 75  Resp: 17  17 20   Temp: (P) 98.5 F (36.9 C)  97.8 F (36.6 C) 97.9 F (36.6 C)  TempSrc: (P) Oral     SpO2: 100%  100% 100%  Weight:  54.1 kg    Height:       General exam: Appears calm and severely nourished. Respiratory system: Clear to auscultation. Respiratory effort normal. Cardiovascular system: S1 & S2 heard, RRR. No JVD, murmurs, rubs, gallops or clicks. No pedal edema. Gastrointestinal system: Abdomen is nondistended, soft and nontender. No organomegaly or masses felt. Normal bowel sounds heard. Central nervous system: Alert and oriented x1. No focal neurological deficits. Extremities: Symmetric 5 x 5 power. Skin: No rashes, lesions or ulcers Psychiatry: Flat affect   Data  Reviewed:  Lab results reviewed.  Family Communication: DSS could not be reached  Disposition: Status is: Inpatient Remains inpatient appropriate because:  Severity of Disease, IV treatment.     Time spent: 35 minutes  Author: Murvin Mana, MD 05/02/2024 1:46 PM  For on call review [email or web address].

## 2024-05-03 DIAGNOSIS — F03918 Unspecified dementia, unspecified severity, with other behavioral disturbance: Secondary | ICD-10-CM | POA: Diagnosis not present

## 2024-05-03 DIAGNOSIS — I70222 Atherosclerosis of native arteries of extremities with rest pain, left leg: Secondary | ICD-10-CM | POA: Diagnosis not present

## 2024-05-03 DIAGNOSIS — M86172 Other acute osteomyelitis, left ankle and foot: Secondary | ICD-10-CM | POA: Diagnosis not present

## 2024-05-03 DIAGNOSIS — J9601 Acute respiratory failure with hypoxia: Secondary | ICD-10-CM | POA: Diagnosis not present

## 2024-05-03 NOTE — Progress Notes (Signed)
 Progress Note   Patient: [name] FMW:[id number] DOB: [birth year] DOA: 04/30/2024     3 DOS: the patient was seen and examined on 05/03/2024   Brief hospital course: 83 y.o. female with medical history significant of dementia who presented from [facility] on  8/18 with SOB after recent admission for sepsis associated with LLE cellulitis and ?PNA. She was noted to have necrotic foot wounds.  She was planned for angiography but she is currently declining amputation. This admission to the hospital, patient has been refusing taking any medications, refused to eat, she also refused amputation.  She wished to die. Discussion with DSS, legal guardian, is ongoing.   Principal Problem:   Critical limb ischemia of left lower extremity (HCC) Active Problems:   Acute hypoxic respiratory failure (HCC)   Bipolar disorder (HCC)   Dementia with behavioral disturbance (HCC)   PAD (peripheral artery disease) (HCC)   Acute osteomyelitis of toe, left (HCC)   Gangrene of toe of left foot (HCC)   DNR (do not resuscitate)   Hypomagnesemia   Assessment and Plan: Critical limb ischemia of LLE with necrotic lesions Left anteromedial ankle, left 1st and 3rd toes, and L medial MTP region necrotic ulcers  Left foot osteomyelitis. Previously admitted for cellulitis earlier this month 11/2023 ABIs were stable She has previously undergone stent placement of the L SFA and popliteal artery She was scheduled for LLE angiogram on 04/24/24 but was hospitalized and it was deferred Now with necrotic-appearing lesions of the LLE Will treat with IV antibiotics (Unasyn  + Linezolid  as per the lower extremity wound algorithm) Podiatry and vascular surgery are consulting MRI with apparent osteo In discussion with vascular surgery, she is likely to need AKA    Patient so far has been refusing all treatment, refusing surgery.  Without amputation, patient condition is futile.  DSS is patient's legal guardian, TOC and I  both called the DSS.  Currently pending decision from the supervisor for comfort care.  Hopefully they can get back to us  in a day or 2.   Acute hypoxic respiratory failure Appears to be improving   Chronic diastolic CHF Exacerbation   Advanced dementia with anxiety, h/o bipolar d/o Functional decline. Severe protein calorie malnutrition. Patient currently is only oriented to herself.  Clearly, she has been refusing every treatment.  Due to poor prognosis, continue discussion with the DSS for possible comfort care.   Hypomagnesemia. Patient received IV mag sulfate       Subjective:  Still refusing taking any medications or food.    Physical Exam: Vitals:   05/02/24 1613 05/02/24 2107 05/03/24 0506 05/03/24 0823  BP: (!) 107/57 (!) 112/52 (!) 109/57 (!) 123/55  Pulse: 80 80 78 70  Resp: 18 20 18 18   Temp: 99.3 F (37.4 C) 98.9 F (37.2 C) 98.1 F (36.7 C) 98.5 F (36.9 C)  TempSrc:  Oral Oral   SpO2: 95% (!) 89% 91% 94%  Weight:      Height:       General exam: Appears calm and comfortable, severely malnourished Respiratory system: Clear to auscultation. Respiratory effort normal. Cardiovascular system: S1 & S2 heard, RRR. No JVD, murmurs, rubs, gallops or clicks. No pedal edema. Gastrointestinal system: Abdomen is nondistended, soft and nontender. No organomegaly or masses felt. Normal bowel sounds heard. Central nervous system: Alert and oriented x1. No focal neurological deficits. Extremities: Symmetric 5 x 5 power. Skin: No rashes, lesions or ulcers Psychiatry:  Mood & affect appropriate.  Data Reviewed:  There are no new results to review at this time.  Family Communication: Pending DSS decision  Disposition: Status is: Inpatient Remains inpatient appropriate because: Pending decision from DSS for comfort care     Time spent: 35 minutes  Author: Murvin Mana, MD 05/03/2024 1:12 PM  For on call review [email or web address].

## 2024-05-03 NOTE — Plan of Care (Signed)

## 2024-05-04 DIAGNOSIS — I70262 Atherosclerosis of native arteries of extremities with gangrene, left leg: Secondary | ICD-10-CM | POA: Diagnosis not present

## 2024-05-04 DIAGNOSIS — L03116 Cellulitis of left lower limb: Secondary | ICD-10-CM | POA: Diagnosis not present

## 2024-05-04 DIAGNOSIS — I70222 Atherosclerosis of native arteries of extremities with rest pain, left leg: Secondary | ICD-10-CM | POA: Diagnosis not present

## 2024-05-04 DIAGNOSIS — L97929 Non-pressure chronic ulcer of unspecified part of left lower leg with unspecified severity: Secondary | ICD-10-CM | POA: Diagnosis not present

## 2024-05-04 DIAGNOSIS — J9601 Acute respiratory failure with hypoxia: Secondary | ICD-10-CM | POA: Diagnosis not present

## 2024-05-04 DIAGNOSIS — M86172 Other acute osteomyelitis, left ankle and foot: Secondary | ICD-10-CM | POA: Diagnosis not present

## 2024-05-04 NOTE — Progress Notes (Signed)
 [facility] LIAISON NOTE   Received request from  Lauraine Carpen, SW/Transitions of Care/TOC, for hospice services at [facility]. Spoke with Kenisha, patient's DSS guardian to initiate education related to hospice philosophy, services, and team approach to care. She is familiar with hospice.  Notified that patient was not eligible for hospice inpatient unit [facility]) but we could offer hospice at [facility].  She would like to proceed with this plan. Plan is for discharge once hoyer lift is delivered.  DME needs discussed.  Patient/family requests the following equipment for delivery:   Deitra Lift       Please send signed and completed DNR home with patient/family if applicable.   Please provide prescriptions at discharge as needed to ensure ongoing symptom management.  Above information shared with Lauraine Carpen, SW/TOC and hospital medical care team.   Please call with any hospice related questions or concerns. Thank you for the opportunity to participate in this patient's care.   Saddie HILARIO Na, MA, BSN, RN, FNE Nurse Liaison [phone]

## 2024-05-04 NOTE — TOC Progression Note (Addendum)
 Transition of Care [facility]) - Progression Note    Patient Details  Name: [name] MRN: [id number] Date of Birth: 13-Jan-1941  [facility]) CM/SW Contact  Lauraine JAYSON Carpen, LCSW Phone Number: 05/04/2024, 10:28 AM  Clinical Narrative:  Left voicemail for guardian to determine if supervisor approved comfort care/hospice.   12:55 pm: Left another voicemail for [facility] as well as her supervisor Montie Hopping [phone]).  2:38 pm: Received call back from [place]. She will have supervisor call CSW or MD to provide consent for hospice. CSW spoke to [place] at [facility] to notify. No agency preference for hospice. They would like for the hospice agency to order her a hoyer lift. They can take her back today or over the weekend.  4:09 pm: MD obtained consent for hospice from [name] with DSS. CSW called Joeann to review agency choice. She prefers [facility]. She wants to see if patient qualifies for the hospice home. If not, return to ALF with hospice. Sent referral to [place] with [facility].  Expected Discharge Plan and Services                                               Social Drivers of Health (SDOH) Interventions SDOH Screenings   Food Insecurity: No Food Insecurity (05/01/2024)  Housing: Low Risk  (05/01/2024)  Transportation Needs: No Transportation Needs (05/01/2024)  Utilities: Not At Risk (05/01/2024)  Financial Resource Strain: Low Risk  (09/22/2023)   Received from [facility]  Social Connections: Unknown (05/01/2024)  Tobacco Use: Medium Risk (05/01/2024)    Readmission Risk Interventions     No data to display

## 2024-05-04 NOTE — Progress Notes (Signed)
 Progress Note    05/04/2024 12:54 PM * No surgery found *  Subjective:  [name] is an 83 y.o. female with medical history significant of dementia who presented from [facility] on  8/18 with SOB after recent admission for sepsis associated with LLE cellulitis and ?PNA. She was noted to have necrotic foot wounds.  She was planned for angiography but she is currently declining amputation. This admission to the hospital, patient has been refusing taking any medications, refused to eat, she also refused amputation.  She wished to die. Discussion with DSS, legal guardian, is ongoing.   Vitals:   05/04/24 0506 05/04/24 0915  BP: (!) 153/74 124/70  Pulse: 81 87  Resp: 16 20  Temp: 99.6 F (37.6 C) 97.8 F (36.6 C)  SpO2: 92% 92%   Physical Exam: Cardiac:  S1 & S2 heard, RRR. No JVD, murmurs, rubs, gallops or clicks. No pedal edema.  Lungs:  Clear to auscultation. Respiratory effort normal.  Incisions:  None Extremities:  All extremities are warm to touch. Unable to palpate pulses in left lower extremity. LLE with multiple areas of ulceration and gangrene.  Abdomen:  Abdomen is nondistended, soft and nontender. No organomegaly or masses felt. Normal bowel sounds heard.  Neurologic: Advanced Dementia. Will only answer my questions staing NO   CBC    Component Value Date/Time   WBC 7.7 05/01/2024 0502   RBC 3.72 (L) 05/01/2024 0502   HGB 9.3 (L) 05/01/2024 0502   HGB 14.5 10/22/2012 0051   HCT 32.3 (L) 05/01/2024 0502   HCT 44.4 10/22/2012 0051   PLT 451 (H) 05/01/2024 0502   PLT 255 10/22/2012 0051   MCV 86.8 05/01/2024 0502   MCV 95 10/22/2012 0051   MCH 25.0 (L) 05/01/2024 0502   MCHC 28.8 (L) 05/01/2024 0502   RDW 17.7 (H) 05/01/2024 0502   RDW 13.3 10/22/2012 0051   LYMPHSABS 2.1 04/30/2024 0712   MONOABS 1.0 04/30/2024 0712   EOSABS 0.6 (H) 04/30/2024 0712   BASOSABS 0.0 04/30/2024 0712    BMET    Component Value Date/Time   NA 141 05/02/2024 0556    NA 143 10/22/2012 0051   K 3.8 05/02/2024 0556   K 3.7 10/22/2012 0051   CL 107 05/02/2024 0556   CL 109 (H) 10/22/2012 0051   CO2 26 05/02/2024 0556   CO2 26 10/22/2012 0051   GLUCOSE 87 05/02/2024 0556   GLUCOSE 129 (H) 10/22/2012 0051   BUN 22 05/02/2024 0556   BUN 33 (H) 10/22/2012 0051   CREATININE 0.46 05/02/2024 0556   CREATININE 0.66 10/22/2012 0051   CALCIUM  9.4 05/02/2024 0556   CALCIUM  9.7 10/22/2012 0051   GFRNONAA >60 05/02/2024 0556   GFRNONAA >60 10/22/2012 0051   GFRAA >60 02/29/2020 1134   GFRAA >60 10/22/2012 0051    INR    Component Value Date/Time   INR 1.0 04/16/2024 0635     Intake/Output Summary (Last 24 hours) at 05/04/2024 1254 Last data filed at 05/04/2024 0900 Gross per 24 hour  Intake 720 ml  Output --  Net 720 ml     Assessment/Plan:  83 y.o. female with advanced dementia and left lower extremity ischemia is noted by multiple areas of open ulcerations/wounds with gangrene.  Patient currently unable to follow commands or answer questions appropriately.  She only answers by saying no.* No surgery found *   PLAN Due to the patient's current condition vascular surgery has no plans for any procedure to the  patient's lower extremity such as angiogram with possible intervention or amputation. Vascular surgery signed off at this time.  DVT prophylaxis: ASA 81 mg daily and Lovenox  40 mg subcu every 24   Jarrod Mcenery R Miran Kautzman [facility] 05/04/2024 12:54 PM

## 2024-05-04 NOTE — Progress Notes (Signed)
 Progress Note   Patient: [name] FMW:[id number] DOB: [birth year] DOA: 04/30/2024     4 DOS: the patient was seen and examined on 05/04/2024   Brief hospital course: 83 y.o. female with medical history significant of dementia who presented from [facility] on  8/18 with SOB after recent admission for sepsis associated with LLE cellulitis and ?PNA. She was noted to have necrotic foot wounds.  She was planned for angiography but she is currently declining amputation. This admission to the hospital, patient has been refusing taking any medications, refused to eat, she also refused amputation.  She wished to die. [name] from DSS has called me, approved comfort care and hospice.   Principal Problem:   Critical limb ischemia of left lower extremity (HCC) Active Problems:   Acute hypoxic respiratory failure (HCC)   Bipolar disorder (HCC)   Dementia with behavioral disturbance (HCC)   PAD (peripheral artery disease) (HCC)   Acute osteomyelitis of toe, left (HCC)   Gangrene of toe of left foot (HCC)   DNR (do not resuscitate)   Hypomagnesemia   Assessment and Plan: Critical limb ischemia of LLE with necrotic lesions Left anteromedial ankle, left 1st and 3rd toes, and L medial MTP region necrotic ulcers  Left foot osteomyelitis. Previously admitted for cellulitis earlier this month 11/2023 ABIs were stable She has previously undergone stent placement of the L SFA and popliteal artery She was scheduled for LLE angiogram on 04/24/24 but was hospitalized and it was deferred Now with necrotic-appearing lesions of the LLE Treated with IV antibiotics (Unasyn  + Linezolid  as per the lower extremity wound algorithm) Podiatry and vascular surgery are consulting MRI with apparent osteo In discussion with vascular surgery, she is likely to need AKA    Patient so far has been refusing all treatment, refusing surgery.  Without amputation, patient condition is futile.  Has been in discussion  with DSS, finally approved comfort care and hospice.  I have discontinued all active treatment Patient can be transferred to assisted living with hospice tomorrow if accepted.   Acute hypoxic respiratory failure Appears to be improving   Chronic diastolic CHF No Exacerbation   Advanced dementia with anxiety, h/o bipolar d/o Functional decline. Severe protein calorie malnutrition. Transition to comfort care.   Hypomagnesemia. Patient receiving IV mag sulfate         Subjective:  Patient became more clear minded today, she expressed her wishes, she does not want additional treatment.  She does not want surgery.  Physical Exam: Vitals:   05/03/24 2049 05/04/24 0506 05/04/24 0915 05/04/24 1459  BP: (!) 119/56 (!) 153/74 124/70 (!) 119/57  Pulse: 79 81 87 91  Resp: 16 16 20 18   Temp: 98.7 F (37.1 C) 99.6 F (37.6 C) 97.8 F (36.6 C) 98.8 F (37.1 C)  TempSrc: Oral Oral  Oral  SpO2: 92% 92% 92% 100%  Weight:      Height:       General exam: Appears calm and comfortable, severely malnourished Respiratory system: Clear to auscultation. Respiratory effort normal. Cardiovascular system: S1 & S2 heard, RRR. No JVD, murmurs, rubs, gallops or clicks. No pedal edema. Gastrointestinal system: Abdomen is nondistended, soft and nontender. No organomegaly or masses felt. Normal bowel sounds heard. Central nervous system: Alert and oriented x3. No focal neurological deficits. Extremities: Symmetric 5 x 5 power. Skin: No rashes, lesions or ulcers Psychiatry: Judgement and insight appear normal. Mood & affect appropriate.    Data Reviewed:  There are no new  results to review at this time.  Family Communication: Discussed with legal guardian, DSS.  Disposition: Status is: Inpatient Remains inpatient appropriate because: Comfort care.     Time spent: 50 minutes  Author: Murvin Mana, MD 05/04/2024 4:26 PM  For on call review [email or web address].

## 2024-05-05 DIAGNOSIS — J9601 Acute respiratory failure with hypoxia: Secondary | ICD-10-CM | POA: Diagnosis not present

## 2024-05-05 DIAGNOSIS — M86172 Other acute osteomyelitis, left ankle and foot: Secondary | ICD-10-CM | POA: Diagnosis not present

## 2024-05-05 DIAGNOSIS — I70222 Atherosclerosis of native arteries of extremities with rest pain, left leg: Secondary | ICD-10-CM | POA: Diagnosis not present

## 2024-05-05 NOTE — Progress Notes (Signed)
 [facility]  [facility] LIAISON NOTE   Follow up on hospice referral for patient who resides at [facility].  Reported to me that facility was requesting a hoyer lift.  I called the facility this morning and spoke with Corean to see if there was any other DME patient needed.  After discussion, Corean informed me that they do not usually allow hoyer lifts in the ALF.  She called her supervisor, Lonni- who told Corean that [facility] would need to come and evaluate Ms. Lamay before they could accept her back at the facility.  Hospice liaison team will continue to follow, communicate and collaborate through final disposition and will order needed DME once disposition location is clear.  Hospital care team updated on my findings.   Please call with any hospice related questions or concerns.  Saddie HILARIO Na, RN Nurse Liaison [phone]

## 2024-05-05 NOTE — TOC Progression Note (Signed)
 Transition of Care [facility]) - Progression Note    Patient Details  Name: [name] MRN: [id number] Date of Birth: 07/31/1941  Transition of Care [facility]) CM/SW Contact  Marinda Cooks, RN Phone Number: 05/05/2024, 11:34 AM  Clinical Narrative:    This CM alerted by [facility] POC [place] DME unable to be delivered today to ALF  [facility], Also [place] updated  Admission POC  at ALF informed a bed side assessment needs to be completed Monday before pt can return to facility with current level of needs to evaluated. TOC will cont to follow dc planning / care coordination.                      Expected Discharge Plan and Services                                               Social Drivers of Health (SDOH) Interventions SDOH Screenings   Food Insecurity: No Food Insecurity (05/01/2024)  Housing: Low Risk  (05/01/2024)  Transportation Needs: No Transportation Needs (05/01/2024)  Utilities: Not At Risk (05/01/2024)  Financial Resource Strain: Low Risk  (09/22/2023)   Received from [name]  Social Connections: Unknown (05/01/2024)  Tobacco Use: Medium Risk (05/01/2024)    Readmission Risk Interventions     No data to display

## 2024-05-05 NOTE — Progress Notes (Signed)
 Progress Note   Patient: [name] FMW:[id number] DOB: July 14, 1941 DOA: 04/30/2024     5 DOS: the patient was seen and examined on 05/05/2024   Brief hospital course: 83 y.o. female with medical history significant of dementia who presented from [facility] on  8/18 with SOB after recent admission for sepsis associated with LLE cellulitis and ?PNA. She was noted to have necrotic foot wounds.  She was planned for angiography but she is currently declining amputation. This admission to the hospital, patient has been refusing taking any medications, refused to eat, she also refused amputation.  She wished to die. [name] from DSS has called me, approved comfort care and hospice.   Principal Problem:   Critical limb ischemia of left lower extremity (HCC) Active Problems:   Acute hypoxic respiratory failure (HCC)   Bipolar disorder (HCC)   Dementia with behavioral disturbance (HCC)   PAD (peripheral artery disease) (HCC)   Acute osteomyelitis of toe, left (HCC)   Gangrene of toe of left foot (HCC)   DNR (do not resuscitate)   Hypomagnesemia   Assessment and Plan: Critical limb ischemia of LLE with necrotic lesions Left anteromedial ankle, left 1st and 3rd toes, and L medial MTP region necrotic ulcers  Left foot osteomyelitis. Previously admitted for cellulitis earlier this month 11/2023 ABIs were stable She has previously undergone stent placement of the L SFA and popliteal artery She was scheduled for LLE angiogram on 04/24/24 but was hospitalized and it was deferred Now with necrotic-appearing lesions of the LLE Treated with IV antibiotics (Unasyn  + Linezolid  as per the lower extremity wound algorithm) Podiatry and vascular surgery are consulting MRI with apparent osteo In discussion with vascular surgery, she is likely to need AKA    Patient so far has been refusing all treatment, refusing surgery.  Without amputation, patient condition is futile.  Has been in discussion  with DSS, finally approved comfort care and hospice.  I have discontinued all active treatment Discussed with TOC, facility will not accept patient until they are reassess her on Monday.  Not able to discharge today.   Acute hypoxic respiratory failure Appears to be improving   Chronic diastolic CHF No Exacerbation   Advanced dementia with anxiety, h/o bipolar d/o Functional decline. Severe protein calorie malnutrition. Transition to comfort care.   Hypomagnesemia. Patient receiving IV mag sulfate         Subjective:  She has no complaint today.  Physical Exam: Vitals:   05/04/24 2139 05/05/24 0424 05/05/24 0552 05/05/24 0856  BP: 116/63 116/62  121/63  Pulse: 84 80  88  Resp: 18     Temp: 99.2 F (37.3 C) 98.8 F (37.1 C)  97.7 F (36.5 C)  TempSrc:    Oral  SpO2: 97% 94%  93%  Weight:   48 kg   Height:       General exam: Appears calm and comfortable, severely malnourished Respiratory system: Clear to auscultation. Respiratory effort normal. Cardiovascular system: S1 & S2 heard, RRR. No JVD, murmurs, rubs, gallops or clicks. No pedal edema. Gastrointestinal system: Abdomen is nondistended, soft and nontender. No organomegaly or masses felt. Normal bowel sounds heard. Central nervous system: Alert and oriented x2. No focal neurological deficits. Extremities: Symmetric 5 x 5 power. Skin: No rashes, lesions or ulcers Psychiatry: Judgement and insight appear normal. Mood & affect appropriate.  '   Data Reviewed:  There are no new results to review at this time.  Family Communication: None  Disposition:  Status is: Inpatient Remains inpatient appropriate because: Comfort care.     Time spent: 32 minutes  Author: Murvin Mana, MD 05/05/2024 11:31 AM  For on call review [email or web address].

## 2024-05-06 DIAGNOSIS — J9601 Acute respiratory failure with hypoxia: Secondary | ICD-10-CM | POA: Diagnosis not present

## 2024-05-06 DIAGNOSIS — M86172 Other acute osteomyelitis, left ankle and foot: Secondary | ICD-10-CM | POA: Diagnosis not present

## 2024-05-06 DIAGNOSIS — I70222 Atherosclerosis of native arteries of extremities with rest pain, left leg: Secondary | ICD-10-CM | POA: Diagnosis not present

## 2024-05-06 LAB — CULTURE, BLOOD (ROUTINE X 2)
Culture: NO GROWTH
Culture: NO GROWTH
Special Requests: ADEQUATE
Special Requests: ADEQUATE

## 2024-05-06 NOTE — Progress Notes (Signed)
 [facility]  [facility] LIAISON NOTE   Follow up on hospice referral for patient who resides at [facility].   No discharge plans today.  [facility] will need to come and evaluate Ms. Joy before they could accept her back at the facility. Likely be tomorrow per staff at ALF.  Hospice liaison team will continue to follow, communicate and collaborate through final disposition and will order needed DME once disposition location is clear.   Hospital care team updated on my findings.    Please call with any hospice related questions or concerns.   Saddie HILARIO Na, RN Nurse Liaison [phone]

## 2024-05-06 NOTE — Progress Notes (Signed)
 Progress Note   Patient: [name] FMW:[id number] DOB: 12/23/1940 DOA: 04/30/2024     6 DOS: the patient was seen and examined on 05/06/2024   Brief hospital course: 83 y.o. female with medical history significant of dementia who presented from [facility] on  8/18 with SOB after recent admission for sepsis associated with LLE cellulitis and ?PNA. She was noted to have necrotic foot wounds.  She was planned for angiography but she is currently declining amputation. This admission to the hospital, patient has been refusing taking any medications, refused to eat, she also refused amputation.  She wished to die. [name] from DSS has called me, approved comfort care and hospice.   Principal Problem:   Critical limb ischemia of left lower extremity (HCC) Active Problems:   Acute hypoxic respiratory failure (HCC)   Bipolar disorder (HCC)   Dementia with behavioral disturbance (HCC)   PAD (peripheral artery disease) (HCC)   Acute osteomyelitis of toe, left (HCC)   Gangrene of toe of left foot (HCC)   DNR (do not resuscitate)   Hypomagnesemia   Assessment and Plan: Critical limb ischemia of LLE with necrotic lesions Left anteromedial ankle, left 1st and 3rd toes, and L medial MTP region necrotic ulcers  Left foot osteomyelitis. Previously admitted for cellulitis earlier this month 11/2023 ABIs were stable She has previously undergone stent placement of the L SFA and popliteal artery She was scheduled for LLE angiogram on 04/24/24 but was hospitalized and it was deferred Now with necrotic-appearing lesions of the LLE Treated with IV antibiotics (Unasyn  + Linezolid  as per the lower extremity wound algorithm) Podiatry and vascular surgery are consulting MRI with apparent osteo In discussion with vascular surgery, she need AKA    Patient so far has been refusing all treatment, refusing surgery.  Without amputation, patient condition is futile.  Has been in discussion with DSS,  finally approved comfort care and hospice.  I have discontinued all active treatment Discussed with TOC, facility will not accept patient until they are reassess her on Monday.  No change inpatient status.   Acute hypoxic respiratory failure Appears to be improving   Chronic diastolic CHF No Exacerbation   Advanced dementia with anxiety, h/o bipolar d/o Functional decline. Severe protein calorie malnutrition. Transition to comfort care.   Hypomagnesemia. Patient receiving IV mag sulfate         Subjective:  Patient has no appetite today, does not want to eat.  Physical Exam: Vitals:   05/05/24 2014 05/06/24 0500 05/06/24 0506 05/06/24 0934  BP: 108/72  97/76 (!) 111/53  Pulse: 77  72 78  Resp: 16  19 18   Temp: 98.6 F (37 C)   98.4 F (36.9 C)  TempSrc:      SpO2: 95%  94% 96%  Weight:  50.3 kg    Height:       General exam: Appears calm and comfortable, malnourished Respiratory system: Clear to auscultation. Respiratory effort normal. Cardiovascular system: S1 & S2 heard, RRR. No JVD, murmurs, rubs, gallops or clicks. No pedal edema. Gastrointestinal system: Abdomen is nondistended, soft and nontender. No organomegaly or masses felt. Normal bowel sounds heard. Central nervous system: Alert and oriented x2. No focal neurological deficits. Extremities: Symmetric 5 x 5 power. Skin: No rashes, lesions or ulcers Psychiatry: Judgement and insight appear normal. Mood & affect appropriate.    Data Reviewed:  There are no new results to review at this time.  Family Communication: None  Disposition: Status is: Inpatient  Remains inpatient appropriate because: Comfort care.     Time spent: 25 minutes  Author: Murvin Mana, MD 05/06/2024 2:52 PM  For on call review [email or web address].

## 2024-05-07 DIAGNOSIS — I70222 Atherosclerosis of native arteries of extremities with rest pain, left leg: Secondary | ICD-10-CM | POA: Diagnosis not present

## 2024-05-07 DIAGNOSIS — M86172 Other acute osteomyelitis, left ankle and foot: Secondary | ICD-10-CM | POA: Diagnosis not present

## 2024-05-07 MED ORDER — OXYCODONE HCL 5 MG PO TABS: 5.0000 mg | ORAL_TABLET | ORAL | 0 refills | Status: AC | PRN

## 2024-05-07 NOTE — Progress Notes (Signed)
 Patient arrived to 75, report received from Lanora, Charity fundraiser. Patient alert to self and refusing vital signs.

## 2024-05-07 NOTE — Progress Notes (Addendum)
 [facility]  [facility] LIAISON NOTE    Follow up on hospice referral for patient who resides at [facility].     [facility] will need to come and evaluate [name] to determine if they can accept her back.     Hospice liaison team will continue to follow, communicate and collaborate through final disposition and will order needed DME once disposition location is clear.   Hospital care team updated on my findings.    Please call with any hospice related questions or concerns.  [facility] Liaison [phone]

## 2024-05-07 NOTE — Progress Notes (Signed)
 Declined turning, repositioning, bathing and dressing change.  [name] [name]

## 2024-05-07 NOTE — Progress Notes (Signed)
 Refused all meds except the Ensure refused to be turned, only let me straighten her up in bed. She did eat breakfast.

## 2024-05-07 NOTE — TOC Progression Note (Addendum)
 Transition of Care [facility]) - Progression Note    Patient Details  Name: [name] MRN: [id number] Date of Birth: [birth year]  Transition of Care [facility]) CM/SW Contact  Lauraine JAYSON Carpen, LCSW Phone Number: 05/07/2024, 10:07 AM  Clinical Narrative:   Left voicemail for Shawnee Novak at [facility] asking for a call back once they have come to assess patient.  3:11 pm: CSW called [facility]. Ms. Novak has stepped out to do an assessment.  Expected Discharge Plan and Services                                               Social Drivers of Health (SDOH) Interventions SDOH Screenings   Food Insecurity: No Food Insecurity (05/01/2024)  Housing: Low Risk  (05/01/2024)  Transportation Needs: No Transportation Needs (05/01/2024)  Utilities: Not At Risk (05/01/2024)  Financial Resource Strain: Low Risk  (09/22/2023)   Received from [facility]  Social Connections: Unknown (05/01/2024)  Tobacco Use: Medium Risk (05/01/2024)    Readmission Risk Interventions     No data to display

## 2024-05-07 NOTE — Progress Notes (Signed)
 Patient politely refuses to allow this RN and Nurse tech to assist in bed change, bed pad and fitted sheet soiled with urine.

## 2024-05-07 NOTE — Progress Notes (Signed)
 Progress Note   Patient: [name] FMW:[id number] DOB: 08-23-1941 DOA: 04/30/2024     7 DOS: the patient was seen and examined on 05/07/2024   Brief hospital course: 83 y.o. female with medical history significant of dementia who presented from [facility] on  8/18 with SOB after recent admission for sepsis associated with LLE cellulitis and ?PNA. She was noted to have necrotic foot wounds.  She was planned for angiography but she is currently declining amputation. This admission to the hospital, patient has been refusing taking any medications, refused to eat, she also refused amputation.  She wished to die. [name] from DSS has called me, approved comfort care and hospice.   Principal Problem:   Critical limb ischemia of left lower extremity (HCC) Active Problems:   Acute hypoxic respiratory failure (HCC)   Bipolar disorder (HCC)   Dementia with behavioral disturbance (HCC)   PAD (peripheral artery disease) (HCC)   Acute osteomyelitis of toe, left (HCC)   Gangrene of toe of left foot (HCC)   DNR (do not resuscitate)   Hypomagnesemia   Assessment and Plan: Critical limb ischemia of LLE with necrotic lesions Left anteromedial ankle, left 1st and 3rd toes, and L medial MTP region necrotic ulcers  Left foot osteomyelitis. Previously admitted for cellulitis earlier this month 11/2023 ABIs were stable She has previously undergone stent placement of the L SFA and popliteal artery She was scheduled for LLE angiogram on 04/24/24 but was hospitalized and it was deferred Now with necrotic-appearing lesions of the LLE Treated with IV antibiotics (Unasyn  + Linezolid  as per the lower extremity wound algorithm) Podiatry and vascular surgery are consulting MRI with apparent osteo In discussion with vascular surgery, she need AKA    Patient so far has been refusing all treatment, refusing surgery.  Without amputation, patient condition is futile.  Has been in discussion with DSS,  finally approved comfort care and hospice.  I have discontinued all active treatment Discussed with TOC, facility will not accept patient until they are reassess her on Monday.  No change inpatient status.   Acute hypoxic respiratory failure Appears to be improving   Chronic diastolic CHF No Exacerbation   Advanced dementia with anxiety, h/o bipolar d/o Functional decline. Severe protein calorie malnutrition. Transition to comfort care.   Hypomagnesemia. Patient receiving IV mag sulfate   Continue comfort care, pending acceptance to assisted living facility with hospice.        Subjective:  Patient still has very poor appetite, eats very little, did not want take her medicines.  Physical Exam: Vitals:   05/06/24 2136 05/07/24 0428 05/07/24 0500 05/07/24 0836  BP: 93/66 (!) 105/46  (!) 100/48  Pulse: 72 70  71  Resp: 20 16  19   Temp: 97.9 F (36.6 C) 97.8 F (36.6 C)  98.4 F (36.9 C)  TempSrc:      SpO2: 94% 98%  98%  Weight:   50.3 kg   Height:       General exam: Appears calm and comfortable, severely malnourished Respiratory system: Clear to auscultation. Respiratory effort normal. Cardiovascular system: S1 & S2 heard, RRR. No JVD, murmurs, rubs, gallops or clicks. No pedal edema. Gastrointestinal system: Abdomen is nondistended, soft and nontender. No organomegaly or masses felt. Normal bowel sounds heard. Central nervous system: Alert and oriented x2. No focal neurological deficits. Extremities: Symmetric 5 x 5 power. Skin: No rashes, lesions or ulcers Psychiatry: Judgement and insight appear normal. Mood & affect appropriate.  Data Reviewed:  There are no new results to review at this time.  Family Communication: None  Disposition: Status is: Inpatient Remains inpatient appropriate because: Comfort care     Time spent: 25 minutes  Author: Murvin Mana, MD 05/07/2024 2:35 PM  For on call review [email or web address].

## 2024-05-07 NOTE — Progress Notes (Signed)
 Patient agreed to let RN and nurse tech change bed sheets and perform peri care.

## 2024-05-08 DIAGNOSIS — J9601 Acute respiratory failure with hypoxia: Secondary | ICD-10-CM | POA: Diagnosis not present

## 2024-05-08 DIAGNOSIS — I70222 Atherosclerosis of native arteries of extremities with rest pain, left leg: Secondary | ICD-10-CM | POA: Diagnosis not present

## 2024-05-08 DIAGNOSIS — M86172 Other acute osteomyelitis, left ankle and foot: Secondary | ICD-10-CM | POA: Diagnosis not present

## 2024-05-08 NOTE — Progress Notes (Signed)
 Progress Note   Patient: [name] FMW:[id number] DOB: Aug 09, 1941 DOA: 04/30/2024     8 DOS: the patient was seen and examined on 05/08/2024   Brief hospital course: 83 y.o. female with medical history significant of dementia who presented from [facility] on  8/18 with SOB after recent admission for sepsis associated with LLE cellulitis and ?PNA. She was noted to have necrotic foot wounds.  She was planned for angiography but she is currently declining amputation. This admission to the hospital, patient has been refusing taking any medications, refused to eat, she also refused amputation.  She wished to die. [name] from DSS has called me, approved comfort care and hospice.   Principal Problem:   Critical limb ischemia of left lower extremity (HCC) Active Problems:   Acute hypoxic respiratory failure (HCC)   Bipolar disorder (HCC)   Dementia with behavioral disturbance (HCC)   PAD (peripheral artery disease) (HCC)   Acute osteomyelitis of toe, left (HCC)   Gangrene of toe of left foot (HCC)   DNR (do not resuscitate)   Hypomagnesemia   Assessment and Plan: Critical limb ischemia of LLE with necrotic lesions Left anteromedial ankle, left 1st and 3rd toes, and L medial MTP region necrotic ulcers  Left foot osteomyelitis. Previously admitted for cellulitis earlier this month 11/2023 ABIs were stable She has previously undergone stent placement of the L SFA and popliteal artery She was scheduled for LLE angiogram on 04/24/24 but was hospitalized and it was deferred Now with necrotic-appearing lesions of the LLE Treated with IV antibiotics (Unasyn  + Linezolid  as per the lower extremity wound algorithm) Podiatry and vascular surgery are consulting MRI with apparent osteo In discussion with vascular surgery, she need AKA    Patient so far has been refusing all treatment, refusing surgery.  Without amputation, patient condition is futile.  Has been in discussion with DSS,  finally approved comfort care and hospice.  I have discontinued all active treatment Discussed with TOC, called [facility] yesterday, states that they would send somebody to assess the patient and request a Hoyer lift.  But up to today, they have not send people to assess patient.  Message left x 2 without response. Patient has been declining, poor appetite.  Eating very little.    Acute hypoxic respiratory failure Appears to be improving   Chronic diastolic CHF No Exacerbation   Advanced dementia with anxiety, h/o bipolar d/o Functional decline. Severe protein calorie malnutrition. Transition to comfort care.   Hypomagnesemia. Patient receiving IV mag sulfate       Subjective:  Patient does not want to eat today.  Confused  Physical Exam: Vitals:   05/07/24 0836 05/07/24 2030 05/08/24 0500 05/08/24 0735  BP: (!) 100/48 120/61  (!) 108/57  Pulse: 71 95  77  Resp: 19 18  15   Temp: 98.4 F (36.9 C) 98.1 F (36.7 C)  98.1 F (36.7 C)  TempSrc:      SpO2: 98% 97%  100%  Weight:   47.6 kg   Height:       General exam: Appears calm and comfortable  Respiratory system: Clear to auscultation. Respiratory effort normal. Cardiovascular system: S1 & S2 heard, RRR. No JVD, murmurs, rubs, gallops or clicks. No pedal edema. Gastrointestinal system: Abdomen is nondistended, soft and nontender. No organomegaly or masses felt. Normal bowel sounds heard. Central nervous system: Alert and oriented x1. No focal neurological deficits. Extremities: Symmetric 5 x 5 power. Skin: No rashes, lesions or ulcers Psychiatry:  Flat affect.    Data Reviewed:  There are no new results to review at this time.  Family Communication: None  Disposition: Status is: Inpatient Remains inpatient appropriate because: Comfort care.     Time spent: 35 minutes  Author: Murvin Mana, MD 05/08/2024 3:15 PM  For on call review [email or web address].

## 2024-05-08 NOTE — Plan of Care (Signed)
 The patient took all of her bedtime medications. No s/s of acute distress noted.

## 2024-05-08 NOTE — Progress Notes (Signed)
 This Clinical research associate noted that the patient's IV to RFA was out. Minimal bleeding noted. Dr. Ninfa, MD notified, told this writer to leave out.

## 2024-05-08 NOTE — TOC Progression Note (Signed)
 Transition of Care [facility]) - Progression Note    Patient Details  Name: [name] MRN: [id number] Date of Birth: Feb 08, 1941  Transition of [facility] CM/SW Contact  Dalia GORMAN Fuse, RN Phone Number: 05/08/2024, 1:49 PM  Clinical Narrative:     TOC called to [facility] and did not receive an answer. TOC lvmm for [employer].                    Expected Discharge Plan and Services                                               Social Drivers of Health (SDOH) Interventions SDOH Screenings   Food Insecurity: No Food Insecurity (05/01/2024)  Housing: Low Risk  (05/01/2024)  Transportation Needs: No Transportation Needs (05/01/2024)  Utilities: Not At Risk (05/01/2024)  Financial Resource Strain: Low Risk  (09/22/2023)   Received from [facility]  Social Connections: Unknown (05/01/2024)  Tobacco Use: Medium Risk (05/01/2024)    Readmission Risk Interventions     No data to display

## 2024-05-08 NOTE — Progress Notes (Addendum)
 [facility] Room 107 [facility] LIAISON NOTE    Follow up on hospice referral for patient who resides at [facility].   Waiting for [facility] to inform hospital if they can allow patient to return.     Hospice liaison team will continue to follow, communicate and collaborate through final disposition and will order needed DME once disposition location is clear.   Please call with any hospice related questions or concerns.   [facility] Liaison [phone]

## 2024-05-09 DIAGNOSIS — F319 Bipolar disorder, unspecified: Secondary | ICD-10-CM | POA: Diagnosis not present

## 2024-05-09 DIAGNOSIS — J9601 Acute respiratory failure with hypoxia: Secondary | ICD-10-CM | POA: Diagnosis not present

## 2024-05-09 DIAGNOSIS — M86172 Other acute osteomyelitis, left ankle and foot: Secondary | ICD-10-CM | POA: Diagnosis not present

## 2024-05-09 DIAGNOSIS — I70222 Atherosclerosis of native arteries of extremities with rest pain, left leg: Secondary | ICD-10-CM | POA: Diagnosis not present

## 2024-05-09 NOTE — Progress Notes (Addendum)
 [facility] Room 107 [facility] Liaison Note  TOC reported that [facility] has accepted patient back to the facility.  HL has left a message with [facility], inquiring about any DME needs.  Awaiting a return phone cal.  Please call with any Hospice related questions or concerns  Thank you for the opportuinity to participate in this patient's care.  [facility] Liaison [phone]

## 2024-05-09 NOTE — Plan of Care (Signed)
 The patient continues on comfort care. No c/o pain or discomfort . Patient took all of her bedtime medications.

## 2024-05-09 NOTE — Discharge Summary (Signed)
 Physician Discharge Summary   Patient: [name] MRN: [id number] DOB: April 01, 1941  Admit date:     04/30/2024  Discharge date: 05/09/24  Discharge Physician: Cresencio Fairly   PCP: Merilee Browning, CRNA   Recommendations at discharge:    [facility] with Hospice/comfort care  Discharge Diagnoses: Principal Problem:   Critical limb ischemia of left lower extremity (HCC) Active Problems:   Acute hypoxic respiratory failure (HCC)   Bipolar disorder (HCC)   Dementia with behavioral disturbance (HCC)   PAD (peripheral artery disease) (HCC)   Acute osteomyelitis of toe, left (HCC)   Gangrene of toe of left foot (HCC)   DNR (do not resuscitate)   Hypomagnesemia  Hospital Course: 83 y.o. female with medical history significant of dementia who presented from [facility] on  8/18 with SOB after recent admission for sepsis associated with LLE cellulitis and ?PNA. She was noted to have necrotic foot wounds.  She was planned for angiography but she is currently declining amputation. This admission to the hospital, patient has been refusing taking any medications, refused to eat, she also refused amputation.  She wished to die. [name] from DSS has called the attending, approved comfort care and hospice.  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 Left anteromedial ankle, left 1st and 3rd toes, and L medial MTP region necrotic ulcers  Left foot osteomyelitis. Previously admitted for cellulitis earlier this month 11/2023 ABIs were stable She has previously undergone stent placement of the L SFA and popliteal artery She was scheduled for LLE angiogram on 04/24/24 but was hospitalized and it was deferred Now with necrotic-appearing lesions of the LLE Treated with IV antibiotics (Unasyn  + Linezolid  as per the lower extremity wound algorithm) Podiatry and vascular surgery seen  MRI with apparent osteo In discussion with vascular surgery, she needs AKA     Patient so far has been refusing all treatment, refusing surgery.  Without amputation, patient condition is futile.  Has been in discussion with DSS, finally approved comfort care and hospice.   Patient has been declining, poor appetite.  Eating very little.     Acute hypoxic respiratory failure May benefit from 2 liter O2    Chronic diastolic CHF No Exacerbation   Advanced dementia with anxiety, h/o bipolar d/o Functional decline. Severe protein calorie malnutrition. comfort care now   Hypomagnesemia. repleted        Consultants: Vascular Surgery, Podiatry  Disposition: Hospice care Diet recommendation:  Discharge Diet Orders (From admission, onward)     Start     Ordered   05/09/24 0000  Diet - low sodium heart healthy        05/09/24 0831           Carb modified diet DISCHARGE MEDICATION: Allergies as of 05/09/2024   No Known Allergies      Medication List     TAKE these medications    acetaminophen  325 MG tablet Commonly known as: TYLENOL  Take 650 mg by mouth 3 (three) times daily.   aspirin  EC 81 MG tablet Take 1 tablet (81 mg total) by mouth daily. Swallow whole.   cholecalciferol  25 MCG (1000 UNIT) tablet Commonly known as: VITAMIN D3 Take 1,000 Units by mouth daily.   feeding supplement Liqd Take 237 mLs by mouth 2 (two) times daily between meals.   ferrous sulfate  325 (65 FE) MG tablet Take 1 tablet (325 mg total) by mouth daily with breakfast.   furosemide  20 MG tablet Commonly  known as: LASIX  Take 20 mg by mouth daily.   lamoTRIgine  25 MG tablet Commonly known as: LAMICTAL  Take 25 mg by mouth 2 (two) times daily.   magnesium  oxide 400 MG tablet Commonly known as: MAG-OX Take 400 mg by mouth daily.   melatonin 5 MG Tabs Take 5 mg by mouth at bedtime.   OLANZapine  5 MG tablet Commonly known as: ZYPREXA  Take 5 mg by mouth at bedtime.   omeprazole 20 MG capsule Commonly known as: PRILOSEC Take 20 mg by mouth daily.    oxyCODONE  5 MG immediate release tablet Commonly known as: Oxy IR/ROXICODONE  Take 1 tablet (5 mg total) by mouth every 4 (four) hours as needed for moderate pain (pain score 4-6) or severe pain (pain score 7-10).   polyethylene glycol 17 g packet Commonly known as: MIRALAX  / GLYCOLAX  Take 34 g by mouth daily.   senna-docusate 8.6-50 MG tablet Commonly known as: Senokot-S Take 2 tablets by mouth 2 (two) times daily.   sertraline  100 MG tablet Commonly known as: ZOLOFT  Take 100 mg by mouth daily.   traZODone  100 MG tablet Commonly known as: DESYREL  Take 100 mg by mouth at bedtime.               Discharge Care Instructions  (From admission, onward)           Start     Ordered   05/09/24 0000  Discharge wound care:       Comments: As above   05/09/24 0831            Discharge Exam: Filed Weights   05/07/24 0500 05/08/24 0500 05/09/24 0500  Weight: 50.3 kg 47.6 kg 48.8 kg   General exam: Appears calm and comfortable  Respiratory system: Clear to auscultation. Respiratory effort normal. Cardiovascular system: S1 & S2 heard, RRR. No JVD, murmurs, rubs, gallops or clicks. No pedal edema. Gastrointestinal system: Abdomen is soft, benign Central nervous system: Alert and oriented x1. No focal neurological deficits. Extremities: Symmetric 5 x 5 power. Skin: Left foot non palpable DP and PT pulses. Necrotic ulcers to the left first ray at the medial aspect 1st met head as well as dorsal medial hallux IPJ area. Also necrotic ulcer dorsal left 3rd toe PIPJ area. No active drainge. Erythema of the first and third toe and about the wound on the medial first ray. 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   Condition at discharge: poor  The results of significant diagnostics from this hospitalization (including imaging, microbiology, ancillary and laboratory) are listed below for reference.   Imaging Studies: MR FOOT LEFT WO CONTRAST Result  Date: 04/30/2024 CLINICAL DATA:  Left foot wounds. Ulceration of the first and second toes. EXAM: MRI OF THE LEFT FOOT WITHOUT CONTRAST TECHNIQUE: Multiplanar, multisequence MR imaging of the left foot was performed. No intravenous contrast was administered. COMPARISON:  Left foot radiographs dated 04/30/2024. FINDINGS: Evaluation is significantly compromised due to motion degradation and sub-optimal positioning secondary to patient condition. Bones/Joint/Soft tissue Sagittal STIR sequence demonstrates increased STIR signal intensity of the first metatarsal head with overlying soft tissue ulceration, suspicious for osteomyelitis versus reactive marrow changes. Increased STIR signal intensity of the first proximal phalanx head and the base of the first distal phalanx at the level of the interphalangeal joint of the great toe with suspected overlying ulceration (series 11, image 17), could reflect reactive marrow changes or osteomyelitis. Increased STIR signal intensity of the distal half  of the third proximal phalanx and the third middle phalanx with overlying soft tissue ulceration is concerning for osteomyelitis. Status post amputation of the second proximal, middle, and distal phalanges. No joint effusion. No loculated fluid collection. IMPRESSION: Evaluation is significantly compromised due to motion degradation and sub-optimal positioning secondary to patient condition. 1. Increased STIR signal intensity of the distal half of the third proximal phalanx and the entirety of the third middle phalanx, at the level of the third PIP joint, with overlying soft tissue ulceration is concerning for osteomyelitis. 2. Increased STIR signal intensity of the first metatarsal head as well as the head of the first proximal phalanx and the base of the first distal phalanx, with overlying soft tissue ulcerations, suspicious for osteomyelitis versus reactive marrow changes. 3. Status post amputation of the second proximal, middle,  and distal phalanges. 4. No loculated fluid collection. Electronically Signed   By: Harrietta Sherry M.D.   On: 04/30/2024 16:16   CT ABDOMEN PELVIS W CONTRAST Result Date: 04/30/2024 CLINICAL DATA:  Abdominal pain, acute, nonlocalized EXAM: CT ABDOMEN AND PELVIS WITH CONTRAST TECHNIQUE: Multidetector CT imaging of the abdomen and pelvis was performed using the standard protocol following bolus administration of intravenous contrast. RADIATION DOSE REDUCTION: This exam was performed according to the departmental dose-optimization program which includes automated exposure control, adjustment of the mA and/or kV according to patient size and/or use of iterative reconstruction technique. CONTRAST:  OMNIPAQUE  IOHEXOL  350 MG/ML SOLN COMPARISON:  CT abdomen/pelvis dated 10/22/2012. FINDINGS: Lower chest: Please refer to the same-day CTA chest for description of intrathoracic findings. Hepatobiliary: 9 mm focal hypodensity in the left hepatic lobe likely represents a benign cyst or hemangioma. Gallbladder is unremarkable. No biliary dilatation. Pancreas: Increased size of a dominant hypoattenuating cystic lesion at the pancreatic body currently measuring 2.0 x 1.6 cm in axial dimension and 2.8 cm in craniocaudal dimension (series 11, image 16 and series 14, image 22), this previously measured approximately 1.2 x 1.1 cm on the prior exam dated 10/22/2012 when measured in a similar manner. Additional surrounding smaller cystic foci also appear increased in size. There is suspected communication with the main pancreatic duct which measures up to 5 mm in diameter. Stable 1.1 cm fat density lesion at the pancreatic uncinate. No surrounding inflammatory changes. Spleen: Normal in size without focal abnormality. Adrenals/Urinary Tract: Adrenal glands are unremarkable. Kidneys enhance symmetrically. No suspicious focal lesion. No urolithiasis or hydronephrosis. Bladder is unremarkable. Stomach/Bowel: Stomach and small  bowel are grossly unremarkable. No obstruction. Moderate volume of stool throughout the colon. Vascular/Lymphatic: Abdominal aorta is normal in caliber with atherosclerotic calcification. Partially visualized left superficial femoral arterial stent. Prominent left inguinal lymph node measures up to 12 mm in short axis (series 11, image 69). Reproductive: Status post hysterectomy. No adnexal masses. Other: No abdominopelvic ascites.  No intraperitoneal free air. Musculoskeletal: Diffuse osseous demineralization. Status post left hip arthroplasty. Redemonstrated chronic T12 compression deformity. IMPRESSION: 1. No acute localizing findings in the abdomen or pelvis. 2. Increased size of a 2.0 x 1.6 x 2.8 cm hypoattenuating cystic lesion at the pancreatic body compared to the prior exam dated 10/22/2012. There is suspected communication with the main pancreatic duct which appears mildly prominent. Additional surrounding smaller cystic foci also appear increased in size. Differential considerations include IPMN, other pancreatic cystic neoplasms, or pseudocyst. Consider follow-up contrast-enhanced CT to monitor for stability, if clinically warranted. 3. Prominent left inguinal lymph node measuring up to 12 mm in short axis is nonspecific and may  be reactive. 4.  Aortic Atherosclerosis (ICD10-I70.0). Electronically Signed   By: Harrietta Sherry M.D.   On: 04/30/2024 10:45   CT Angio Chest PE W and/or Wo Contrast Result Date: 04/30/2024 CLINICAL DATA:  Pulmonary embolism (PE) suspected, high prob Shortness of breath this morning. Recent admission for leg infection and sepsis. EXAM: CT ANGIOGRAPHY CHEST WITH CONTRAST TECHNIQUE: Multidetector CT imaging of the chest was performed using the standard protocol during bolus administration of intravenous contrast. Multiplanar CT image reconstructions and MIPs were obtained to evaluate the vascular anatomy. RADIATION DOSE REDUCTION: This exam was performed according to the  departmental dose-optimization program which includes automated exposure control, adjustment of the mA and/or kV according to patient size and/or use of iterative reconstruction technique. CONTRAST:  OMNIPAQUE  IOHEXOL  350 MG/ML SOLN COMPARISON:  Radiographs 04/30/2024. Chest CTA 12/12/2020. Abdominal CT 04/30/2024 dictated separately. FINDINGS: Cardiovascular: The pulmonary arteries are well opacified with contrast to the level of the segmental branches. There is no evidence of acute pulmonary embolism. Atherosclerosis of the aorta, great vessels and coronary arteries. There is suboptimal aortic opacification but no evidence of acute systemic arterial abnormality. There are calcifications of the aortic valve and a small amount of pericardial fluid. The heart size is normal. Mediastinum/Nodes: There are no enlarged mediastinal, hilar or axillary lymph nodes. The thyroid gland, trachea and esophagus demonstrate no significant findings. Lungs/Pleura: No pleural effusion or pneumothorax. Underlying moderate centrilobular and paraseptal emphysema with interval resolution of the previously demonstrated patchy left basilar airspace disease. There are bandlike opacities in both lungs consistent with chronic atelectasis or scarring. Stable 6 x 3 mm anterior right lower lobe nodule on image 113/6 and additional scattered tiny nodules. No new airspace disease or suspicious pulmonary nodularity. Upper abdomen:  Dictated separately. Musculoskeletal/Chest wall: There is no chest wall mass or suspicious osseous finding. Grossly stable chronic superior endplate compression deformities at T12 and L1. Review of the MIP images confirms the above findings. IMPRESSION: 1. No evidence of acute pulmonary embolism or other acute chest findings. 2. Interval resolution of previously demonstrated left basilar airspace disease. 3. Aortic Atherosclerosis (ICD10-I70.0) and Emphysema (ICD10-J43.9). Electronically Signed   By: Elsie Perone M.D.   On: 04/30/2024 10:29   DG Foot Complete Left Result Date: 04/30/2024 CLINICAL DATA:  Foot pain . EXAM: LEFT FOOT - COMPLETE 3+ VIEW COMPARISON:  04/28/2023. FINDINGS: Interval second toe amputation. Marked bony demineralization. No evidence for an acute fracture. IP joint of the great toe and PIP joint of the third toe are not well evaluated on the AP or oblique imaging, presumably positional although some cortical loss at each joint is not excluded. IMPRESSION: 1. Interval second toe amputation. 2. Marked bony demineralization. 3. No evidence for an acute fracture. 4. IP joint of the great toe and PIP joint of the third toe are not well evaluated on the AP or oblique imaging, possibly positional although some cortical loss at each joint is suggested. Joint infection/osteomyelitis is a concern. Correlate clinically and consider foot MRI with and without contrast to further evaluate as clinically warranted. Electronically Signed   By: Camellia Candle M.D.   On: 04/30/2024 07:49   DG Chest Portable 1 View Result Date: 04/30/2024 CLINICAL DATA:  Shortness of breath. EXAM: PORTABLE CHEST 1 VIEW COMPARISON:  04/16/2024 FINDINGS: Marked rightward patient rotation. Lungs are hyperexpanded. Interstitial markings are diffusely coarsened with chronic features. Cardiopericardial silhouette is at upper limits of normal for size. Bones are diffusely demineralized. Telemetry leads overlie the chest.  IMPRESSION: Hyperexpansion with chronic interstitial coarsening. No acute cardiopulmonary findings. Electronically Signed   By: Camellia Candle M.D.   On: 04/30/2024 07:44   CT HEAD WO CONTRAST ( ) Result Date: 04/16/2024 EXAM: CT HEAD WITHOUT 04/16/2024 04:26:00 PM TECHNIQUE: CT of the head was performed without the administration of intravenous contrast. Automated exposure control, iterative reconstruction, and/or weight based adjustment of the mA/kV was utilized to reduce the radiation dose to as low as  reasonably achievable. COMPARISON: 03/28/2021 CLINICAL HISTORY: Stroke, follow up. Sepsis. FINDINGS: BRAIN AND VENTRICLES: No acute intracranial hemorrhage. No mass effect or midline shift. No extra-axial fluid collection. Gray-white differentiation is maintained. No hydrocephalus. Chronic ischemic white matter changes. ORBITS: No acute abnormality. SINUSES AND MASTOIDS: No acute abnormality. SOFT TISSUES AND SKULL: No acute skull fracture. No acute soft tissue abnormality. Atherosclerotic calcifications of the internal carotid arteries at the skull base. IMPRESSION: 1. No acute intracranial abnormality. 2. Chronic ischemic white matter changes. 3. Atherosclerotic calcifications of the internal carotid arteries at the skull base. Electronically signed by: Franky Stanford MD 04/16/2024 07:27 PM EDT RP Workstation: [id number]   DG Chest Portable 1 View Result Date: 04/16/2024 EXAM: 1 VIEW XRAY OF THE CHEST 04/16/2024 07:20:52 AM COMPARISON: 12/19/2020. CLINICAL HISTORY: Sepsis. FINDINGS: LUNGS AND PLEURA: Significant resolution of the bibasilar opacities with some residual linear opacities. No pulmonary edema. No pleural effusion. No pneumothorax. HEART AND MEDIASTINUM: No acute abnormality of the cardiac and mediastinal silhouettes. Aortic atherosclerosis. BONES AND SOFT TISSUES: No acute osseous abnormality. IMPRESSION: 1. Significant resolution of the bibasilar opacities with some residual linear opacities. Electronically signed by: Katheleen Faes MD 04/16/2024 07:26 AM EDT RP Workstation: [id number]    Microbiology: Results for orders placed or performed during the hospital encounter of 04/30/24  Resp panel by RT-PCR (RSV, Flu A&B, Covid) Anterior Nasal Swab     Status: None   Collection Time: 04/30/24  7:12 AM   Specimen: Anterior Nasal Swab  Result Value Ref Range Status   SARS Coronavirus 2 by RT PCR NEGATIVE NEGATIVE Final    Comment: (NOTE) SARS-CoV-2 target nucleic acids are NOT DETECTED.  The  SARS-CoV-2 RNA is generally detectable in upper respiratory specimens during the acute phase of infection. The lowest concentration of SARS-CoV-2 viral copies this assay can detect is 138 copies/mL. A negative result does not preclude SARS-Cov-2 infection and should not be used as the sole basis for treatment or other patient management decisions. A negative result may occur with  improper specimen collection/handling, submission of specimen other than nasopharyngeal swab, presence of viral mutation(s) within the areas targeted by this assay, and inadequate number of viral copies(<138 copies/mL). A negative result must be combined with clinical observations, patient history, and epidemiological information. The expected result is Negative.  Fact Sheet for Patients:  BloggerCourse.com  Fact Sheet for Healthcare Providers:  SeriousBroker.it  This test is no t yet approved or cleared by the [place]  FDA and  has been authorized for detection and/or diagnosis of SARS-CoV-2 by FDA under an Emergency Use Authorization (EUA). This EUA will remain  in effect (meaning this test can be used) for the duration of the COVID-19 declaration under Section 564(b)(1) of the Act, 21 U.S.C.section 360bbb-3(b)(1), unless the authorization is terminated  or revoked sooner.       Influenza A by PCR NEGATIVE NEGATIVE Final   Influenza B by PCR NEGATIVE NEGATIVE Final    Comment: (NOTE) The Xpert Xpress SARS-CoV-2/FLU/RSV plus assay is intended as an aid in the diagnosis of  influenza from Nasopharyngeal swab specimens and should not be used as a sole basis for treatment. Nasal washings and aspirates are unacceptable for Xpert Xpress SARS-CoV-2/FLU/RSV testing.  Fact Sheet for Patients: BloggerCourse.com  Fact Sheet for Healthcare Providers: SeriousBroker.it  This test is not yet approved or  cleared by the [place]  FDA and has been authorized for detection and/or diagnosis of SARS-CoV-2 by FDA under an Emergency Use Authorization (EUA). This EUA will remain in effect (meaning this test can be used) for the duration of the COVID-19 declaration under Section 564(b)(1) of the Act, 21 U.S.C. section 360bbb-3(b)(1), unless the authorization is terminated or revoked.     Resp Syncytial Virus by PCR NEGATIVE NEGATIVE Final    Comment: (NOTE) Fact Sheet for Patients: BloggerCourse.com  Fact Sheet for Healthcare Providers: SeriousBroker.it  This test is not yet approved or cleared by the [place]  FDA and has been authorized for detection and/or diagnosis of SARS-CoV-2 by FDA under an Emergency Use Authorization (EUA). This EUA will remain in effect (meaning this test can be used) for the duration of the COVID-19 declaration under Section 564(b)(1) of the Act, 21 U.S.C. section 360bbb-3(b)(1), unless the authorization is terminated or revoked.  Performed at [facility], [address] [place], [place] [address]   Blood Cultures x 2 sites     Status: None   Collection Time: 04/30/24  7:22 PM   Specimen: BLOOD LEFT FOREARM  Result Value Ref Range Status   Specimen Description BLOOD LEFT FOREARM  Final   Special Requests   Final    BOTTLES DRAWN AEROBIC AND ANAEROBIC Blood Culture adequate volume   Culture   Final    NO GROWTH 5 DAYS Performed at [facility], [address] [place], [place] [address]    Report Status 05/06/2024 FINAL  Final  Blood Cultures x 2 sites     Status: None   Collection Time: 04/30/24  7:28 PM   Specimen: Left Antecubital; Blood  Result Value Ref Range Status   Specimen Description LEFT ANTECUBITAL  Final   Special Requests   Final    BOTTLES DRAWN AEROBIC AND ANAEROBIC Blood Culture adequate volume   Culture   Final    NO GROWTH 5 DAYS Performed at [facility], [address] [place], [place] [address]    Report Status 05/06/2024 FINAL  Final      Discharge time spent: greater than 30 minutes.  Signed: Cresencio Fairly, MD [facility] 05/09/2024

## 2024-05-09 NOTE — TOC Transition Note (Signed)
 Transition of Care [facility]) - Discharge Note   Patient Details  Name: [name] MRN: [id number] Date of Birth: November 23, 1940  [facility] CM/SW Contact:  Dalia GORMAN Fuse, RN Phone Number: 05/09/2024, 12:22 PM   Clinical Narrative:     The patient is medically clear to discharge. TOC received a call from the patient's legal gurdian requesting an update on the patient's discharge. TOC explained that we have attempted to make outreach to the facility for three days without success. The guardian advised that she would attempt to reachout as well.   TOC placed call to [employer] and asked for [place] spoke with a Nurse Tech who advised the patient could come back but would need an order for Hospice per Star with [facility]. TOC faxed MAR, DC summary, and AVS to [facility].    TOC placed call to Star with [facility] [phone] and was advised the facility NP will write the order.  TOC placed a call to the patient's legal guardian Velta to make her aware of the patient's discharge. [facility] will transport.        Patient Goals and CMS Choice            Discharge Placement                       Discharge Plan and Services Additional resources added to the After Visit Summary for                                       Social Drivers of Health (SDOH) Interventions SDOH Screenings   Food Insecurity: No Food Insecurity (05/01/2024)  Housing: Low Risk  (05/01/2024)  Transportation Needs: No Transportation Needs (05/01/2024)  Utilities: Not At Risk (05/01/2024)  Financial Resource Strain: Low Risk  (09/22/2023)   Received [facility]  Social Connections: Unknown (05/01/2024)  Tobacco Use: Medium Risk (05/01/2024)     Readmission Risk Interventions     No data to display

## 2024-05-09 NOTE — TOC Progression Note (Signed)
 Transition of Care [facility]) - Progression Note    Patient Details  Name: [name] MRN: [id number] Date of Birth: 1941/01/26  Transition of Care [facility] CM/SW Contact  Dalia GORMAN Fuse, RN Phone Number: 05/09/2024, 9:22 AM  Clinical Narrative:    TOC placed call to [facility], after ringing several times the call was routed to Christan, the Director. TOC left a voicemail for [employer] requesting a callback.                     Expected Discharge Plan and Services         Expected Discharge Date: 05/09/24                                     Social Drivers of Health (SDOH) Interventions SDOH Screenings   Food Insecurity: No Food Insecurity (05/01/2024)  Housing: Low Risk  (05/01/2024)  Transportation Needs: No Transportation Needs (05/01/2024)  Utilities: Not At Risk (05/01/2024)  Financial Resource Strain: Low Risk  (09/22/2023)   Received from [facility]  Social Connections: Unknown (05/01/2024)  Tobacco Use: Medium Risk (05/01/2024)    Readmission Risk Interventions     No data to display

## 2024-05-09 NOTE — Progress Notes (Signed)
 Patient supine in bed, oriented to self only; crying that she does not want to go back.  Dr Maree notified of temp of 100.6 with instruction to administer tylenol .  Patient refuses all morning RN medication and assessment-Dr Maree notified.

## 2024-05-09 NOTE — Progress Notes (Signed)
 Silvestre legal guardian made aware of pts dc order [place] [facility]
